# Patient Record
Sex: Male | Born: 1955 | Race: White | Hispanic: No | Smoking: Never smoker
Health system: Southern US, Community
[De-identification: ages and names within clinical notes are randomized; demographics above are authoritative.]

## PROBLEM LIST (undated history)

## (undated) DIAGNOSIS — F1910 Other psychoactive substance abuse, uncomplicated: Secondary | ICD-10-CM

## (undated) DIAGNOSIS — T7840XA Allergy, unspecified, initial encounter: Secondary | ICD-10-CM

## (undated) DIAGNOSIS — C189 Malignant neoplasm of colon, unspecified: Secondary | ICD-10-CM

## (undated) DIAGNOSIS — I714 Abdominal aortic aneurysm, without rupture: Secondary | ICD-10-CM

## (undated) DIAGNOSIS — M1990 Unspecified osteoarthritis, unspecified site: Secondary | ICD-10-CM

## (undated) DIAGNOSIS — Z8489 Family history of other specified conditions: Secondary | ICD-10-CM

## (undated) DIAGNOSIS — Z973 Presence of spectacles and contact lenses: Secondary | ICD-10-CM

## (undated) DIAGNOSIS — J449 Chronic obstructive pulmonary disease, unspecified: Secondary | ICD-10-CM

## (undated) DIAGNOSIS — I10 Essential (primary) hypertension: Secondary | ICD-10-CM

## (undated) DIAGNOSIS — K759 Inflammatory liver disease, unspecified: Secondary | ICD-10-CM

## (undated) DIAGNOSIS — K219 Gastro-esophageal reflux disease without esophagitis: Secondary | ICD-10-CM

## (undated) DIAGNOSIS — K4090 Unilateral inguinal hernia, without obstruction or gangrene, not specified as recurrent: Secondary | ICD-10-CM

## (undated) DIAGNOSIS — I7143 Infrarenal abdominal aortic aneurysm, without rupture: Secondary | ICD-10-CM

## (undated) HISTORY — PX: POLYPECTOMY: SHX149

## (undated) HISTORY — DX: Essential (primary) hypertension: I10

## (undated) HISTORY — PX: OTHER SURGICAL HISTORY: SHX169

## (undated) HISTORY — DX: Other psychoactive substance abuse, uncomplicated: F19.10

## (undated) HISTORY — DX: Chronic obstructive pulmonary disease, unspecified: J44.9

## (undated) HISTORY — DX: Allergy, unspecified, initial encounter: T78.40XA

## (undated) HISTORY — DX: Unspecified osteoarthritis, unspecified site: M19.90

---

## 2001-07-11 ENCOUNTER — Encounter: Payer: Self-pay | Admitting: Internal Medicine

## 2001-07-11 ENCOUNTER — Encounter: Admission: RE | Admit: 2001-07-11 | Discharge: 2001-07-11 | Payer: Self-pay | Admitting: Internal Medicine

## 2009-08-01 ENCOUNTER — Emergency Department (HOSPITAL_COMMUNITY): Admission: EM | Admit: 2009-08-01 | Discharge: 2009-08-01 | Payer: Self-pay | Admitting: Emergency Medicine

## 2009-08-03 ENCOUNTER — Emergency Department (HOSPITAL_COMMUNITY): Admission: EM | Admit: 2009-08-03 | Discharge: 2009-08-03 | Payer: Self-pay | Admitting: Emergency Medicine

## 2011-02-18 LAB — PROTIME-INR
INR: 1 (ref 0.00–1.49)
Prothrombin Time: 12.6 seconds (ref 11.6–15.2)

## 2011-02-18 LAB — CBC
HCT: 43.4 % (ref 39.0–52.0)
Hemoglobin: 14.9 g/dL (ref 13.0–17.0)
MCHC: 34.3 g/dL (ref 30.0–36.0)
MCV: 91.6 fL (ref 78.0–100.0)
Platelets: 184 10*3/uL (ref 150–400)
RBC: 4.74 MIL/uL (ref 4.22–5.81)
RDW: 12.9 % (ref 11.5–15.5)
WBC: 11.1 10*3/uL — ABNORMAL HIGH (ref 4.0–10.5)

## 2011-02-18 LAB — DIFFERENTIAL
Basophils Absolute: 0 10*3/uL (ref 0.0–0.1)
Basophils Relative: 0 % (ref 0–1)
Eosinophils Absolute: 0.1 10*3/uL (ref 0.0–0.7)
Eosinophils Relative: 1 % (ref 0–5)
Lymphocytes Relative: 15 % (ref 12–46)
Lymphs Abs: 1.6 10*3/uL (ref 0.7–4.0)
Monocytes Absolute: 0.8 10*3/uL (ref 0.1–1.0)
Monocytes Relative: 8 % (ref 3–12)
Neutro Abs: 8.5 10*3/uL — ABNORMAL HIGH (ref 1.7–7.7)
Neutrophils Relative %: 77 % (ref 43–77)

## 2011-02-18 LAB — WOUND CULTURE

## 2020-02-29 ENCOUNTER — Ambulatory Visit: Payer: Self-pay | Attending: Internal Medicine

## 2020-02-29 DIAGNOSIS — Z23 Encounter for immunization: Secondary | ICD-10-CM

## 2020-02-29 NOTE — Progress Notes (Signed)
   Covid-19 Vaccination Clinic  Name:  Jordan [name]    MRN: [id number] DOB: 06/10/1956  02/29/2020  [name] was observed post Covid-19 immunization for 15 minutes without incident. He was provided with Vaccine Information Sheet and instruction to access the V-Safe system.   [name] was instructed to call 911 with any severe reactions post vaccine: Marland Kitchen Difficulty breathing  . Swelling of face and throat  . A fast heartbeat  . A bad rash all over body  . Dizziness and weakness   Immunizations Administered    Name Date Dose VIS Date Route   Pfizer COVID-19 Vaccine 02/29/2020 11:42 AM 0.3 mL 10/25/2019 Intramuscular   Manufacturer: [place]   Lot: [id number]   [employer]: [id number]

## 2020-03-23 ENCOUNTER — Ambulatory Visit: Payer: Medicare Other | Attending: Internal Medicine

## 2020-03-25 ENCOUNTER — Ambulatory Visit: Payer: Medicare Other | Attending: Internal Medicine

## 2020-03-25 DIAGNOSIS — Z23 Encounter for immunization: Secondary | ICD-10-CM

## 2020-03-25 NOTE — Progress Notes (Signed)
   Covid-19 Vaccination Clinic  Name:  Jordan [name]    MRN: [id number] DOB: 1955/12/06  03/25/2020  Mr. [name] was observed post Covid-19 immunization for 15 minutes without incident. He was provided with Vaccine Information Sheet and instruction to access the V-Safe system.   Mr. [name] was instructed to call 911 with any severe reactions post vaccine: Marland Kitchen Difficulty breathing  . Swelling of face and throat  . A fast heartbeat  . A bad rash all over body  . Dizziness and weakness   Immunizations Administered    Name Date Dose VIS Date Route   Pfizer COVID-19 Vaccine 03/25/2020 12:50 PM 0.3 mL 01/08/2019 Intramuscular   Manufacturer: [place]   Lot: [id number]   [place]: [id number]

## 2020-04-03 ENCOUNTER — Emergency Department (HOSPITAL_COMMUNITY): Payer: Medicare Other

## 2020-04-03 ENCOUNTER — Inpatient Hospital Stay (HOSPITAL_COMMUNITY)
Admission: EM | Admit: 2020-04-03 | Discharge: 2020-04-06 | DRG: 964 | Disposition: A | Discharge status: Home / Self Care | Payer: Medicare Other | Attending: Surgery | Admitting: Surgery

## 2020-04-03 ENCOUNTER — Encounter (HOSPITAL_COMMUNITY): Payer: Self-pay

## 2020-04-03 DIAGNOSIS — S32019A Unspecified fracture of first lumbar vertebra, initial encounter for closed fracture: Secondary | ICD-10-CM | POA: Diagnosis present

## 2020-04-03 DIAGNOSIS — S24102A Unspecified injury at T2-T6 level of thoracic spinal cord, initial encounter: Secondary | ICD-10-CM | POA: Diagnosis present

## 2020-04-03 DIAGNOSIS — I714 Abdominal aortic aneurysm, without rupture: Secondary | ICD-10-CM | POA: Diagnosis present

## 2020-04-03 DIAGNOSIS — Y990 Civilian activity done for income or pay: Secondary | ICD-10-CM

## 2020-04-03 DIAGNOSIS — W19XXXA Unspecified fall, initial encounter: Secondary | ICD-10-CM | POA: Diagnosis present

## 2020-04-03 DIAGNOSIS — S270XXA Traumatic pneumothorax, initial encounter: Secondary | ICD-10-CM | POA: Diagnosis present

## 2020-04-03 DIAGNOSIS — M4804 Spinal stenosis, thoracic region: Secondary | ICD-10-CM | POA: Diagnosis present

## 2020-04-03 DIAGNOSIS — W14XXXA Fall from tree, initial encounter: Secondary | ICD-10-CM | POA: Diagnosis not present

## 2020-04-03 DIAGNOSIS — S2249XA Multiple fractures of ribs, unspecified side, initial encounter for closed fracture: Secondary | ICD-10-CM

## 2020-04-03 DIAGNOSIS — S42112A Displaced fracture of body of scapula, left shoulder, initial encounter for closed fracture: Secondary | ICD-10-CM | POA: Diagnosis present

## 2020-04-03 DIAGNOSIS — Z20822 Contact with and (suspected) exposure to covid-19: Secondary | ICD-10-CM | POA: Diagnosis present

## 2020-04-03 DIAGNOSIS — S27321A Contusion of lung, unilateral, initial encounter: Secondary | ICD-10-CM | POA: Diagnosis present

## 2020-04-03 DIAGNOSIS — S2243XA Multiple fractures of ribs, bilateral, initial encounter for closed fracture: Secondary | ICD-10-CM | POA: Diagnosis present

## 2020-04-03 DIAGNOSIS — S2241XA Multiple fractures of ribs, right side, initial encounter for closed fracture: Secondary | ICD-10-CM

## 2020-04-03 DIAGNOSIS — S22010A Wedge compression fracture of first thoracic vertebra, initial encounter for closed fracture: Secondary | ICD-10-CM | POA: Diagnosis present

## 2020-04-03 DIAGNOSIS — I2510 Atherosclerotic heart disease of native coronary artery without angina pectoris: Secondary | ICD-10-CM | POA: Diagnosis present

## 2020-04-03 DIAGNOSIS — I700 Atherosclerosis of aorta: Secondary | ICD-10-CM | POA: Diagnosis present

## 2020-04-03 DIAGNOSIS — R52 Pain, unspecified: Secondary | ICD-10-CM

## 2020-04-03 DIAGNOSIS — J9811 Atelectasis: Secondary | ICD-10-CM | POA: Diagnosis present

## 2020-04-03 DIAGNOSIS — S22031A Stable burst fracture of third thoracic vertebra, initial encounter for closed fracture: Principal | ICD-10-CM | POA: Diagnosis present

## 2020-04-03 DIAGNOSIS — S32029A Unspecified fracture of second lumbar vertebra, initial encounter for closed fracture: Secondary | ICD-10-CM | POA: Diagnosis present

## 2020-04-03 DIAGNOSIS — R0602 Shortness of breath: Secondary | ICD-10-CM | POA: Diagnosis not present

## 2020-04-03 DIAGNOSIS — T1490XA Injury, unspecified, initial encounter: Secondary | ICD-10-CM

## 2020-04-03 DIAGNOSIS — J90 Pleural effusion, not elsewhere classified: Secondary | ICD-10-CM | POA: Diagnosis present

## 2020-04-03 DIAGNOSIS — S22009A Unspecified fracture of unspecified thoracic vertebra, initial encounter for closed fracture: Secondary | ICD-10-CM

## 2020-04-03 LAB — I-STAT CHEM 8, ED
BUN: 18 mg/dL (ref 8–23)
Calcium, Ion: 1.03 mmol/L — ABNORMAL LOW (ref 1.15–1.40)
Chloride: 101 mmol/L (ref 98–111)
Creatinine, Ser: 1.1 mg/dL (ref 0.61–1.24)
Glucose, Bld: 134 mg/dL — ABNORMAL HIGH (ref 70–99)
HCT: 48 % (ref 39.0–52.0)
Hemoglobin: 16.3 g/dL (ref 13.0–17.0)
Potassium: 4.1 mmol/L (ref 3.5–5.1)
Sodium: 138 mmol/L (ref 135–145)
TCO2: 28 mmol/L (ref 22–32)

## 2020-04-03 LAB — CBC
HCT: 46.7 % (ref 39.0–52.0)
Hemoglobin: 15.5 g/dL (ref 13.0–17.0)
MCH: 30.2 pg (ref 26.0–34.0)
MCHC: 33.2 g/dL (ref 30.0–36.0)
MCV: 90.9 fL (ref 80.0–100.0)
Platelets: 167 10*3/uL (ref 150–400)
RBC: 5.14 MIL/uL (ref 4.22–5.81)
RDW: 13.3 % (ref 11.5–15.5)
WBC: 7.9 10*3/uL (ref 4.0–10.5)
nRBC: 0 % (ref 0.0–0.2)

## 2020-04-03 LAB — COMPREHENSIVE METABOLIC PANEL
ALT: 71 U/L — ABNORMAL HIGH (ref 0–44)
AST: 91 U/L — ABNORMAL HIGH (ref 15–41)
Albumin: 3.6 g/dL (ref 3.5–5.0)
Alkaline Phosphatase: 64 U/L (ref 38–126)
Anion gap: 12 (ref 5–15)
BUN: 16 mg/dL (ref 8–23)
CO2: 24 mmol/L (ref 22–32)
Calcium: 8.4 mg/dL — ABNORMAL LOW (ref 8.9–10.3)
Chloride: 98 mmol/L (ref 98–111)
Creatinine, Ser: 1.2 mg/dL (ref 0.61–1.24)
GFR calc Af Amer: >60 mL/min (ref >60)
GFR calc non Af Amer: >60 mL/min (ref >60)
Glucose, Bld: 149 mg/dL — ABNORMAL HIGH (ref 70–99)
Potassium: 4.2 mmol/L (ref 3.5–5.1)
Sodium: 134 mmol/L — ABNORMAL LOW (ref 135–145)
Total Bilirubin: 1 mg/dL (ref 0.3–1.2)
Total Protein: 6.6 g/dL (ref 6.5–8.1)

## 2020-04-03 LAB — PROTIME-INR
INR: 1 (ref 0.8–1.2)
Prothrombin Time: 12.9 seconds (ref 11.4–15.2)

## 2020-04-03 LAB — ETHANOL: Alcohol, Ethyl (B): <10 mg/dL (ref <10)

## 2020-04-03 LAB — SAMPLE TO BLOOD BANK

## 2020-04-03 LAB — LACTIC ACID, PLASMA: Lactic Acid, Venous: 2.2 mmol/L (ref 0.5–1.9)

## 2020-04-03 LAB — SARS CORONAVIRUS 2 BY RT PCR (HOSPITAL ORDER, PERFORMED IN ~~LOC~~ HOSPITAL LAB): SARS Coronavirus 2: NEGATIVE

## 2020-04-03 MED ORDER — FENTANYL CITRATE (PF) 100 MCG/2ML IJ SOLN
50.0000 ug | Freq: Once | INTRAMUSCULAR | Status: AC
  Administered 2020-04-03: 50 ug via INTRAVENOUS

## 2020-04-03 MED ORDER — PANTOPRAZOLE SODIUM 40 MG IV SOLR: 40.0000 mg | Freq: Every day | INTRAVENOUS | Status: DC

## 2020-04-03 MED ORDER — OXYCODONE HCL 5 MG PO TABS
5.0000 mg | ORAL_TABLET | ORAL | Status: DC | PRN
  Administered 2020-04-03 – 2020-04-06 (×10): 5 mg via ORAL
  Filled 2020-04-03 (×10): qty 1

## 2020-04-03 MED ORDER — SODIUM CHLORIDE 0.9 % IV SOLN: INTRAVENOUS | Status: DC

## 2020-04-03 MED ORDER — IBUPROFEN 800 MG PO TABS
800.0000 mg | ORAL_TABLET | Freq: Three times a day (TID) | ORAL | Status: DC
  Administered 2020-04-03 – 2020-04-06 (×9): 800 mg via ORAL
  Filled 2020-04-03 (×2): qty 1
  Filled 2020-04-03: qty 4
  Filled 2020-04-03: qty 1
  Filled 2020-04-03 (×4): qty 4
  Filled 2020-04-03 (×2): qty 1
  Filled 2020-04-03 (×2): qty 4
  Filled 2020-04-03 (×3): qty 1

## 2020-04-03 MED ORDER — ONDANSETRON HCL 4 MG/2ML IJ SOLN: 4.0000 mg | Freq: Four times a day (QID) | INTRAMUSCULAR | Status: DC | PRN

## 2020-04-03 MED ORDER — CHLORHEXIDINE GLUCONATE 0.12 % MT SOLN
15.0000 mL | Freq: Two times a day (BID) | OROMUCOSAL | Status: DC
  Administered 2020-04-04 – 2020-04-06 (×5): 15 mL via OROMUCOSAL
  Filled 2020-04-03 (×2): qty 15

## 2020-04-03 MED ORDER — IOHEXOL 300 MG/ML  SOLN
100.0000 mL | Freq: Once | INTRAMUSCULAR | Status: AC | PRN
  Administered 2020-04-03: 100 mL via INTRAVENOUS

## 2020-04-03 MED ORDER — HYDROMORPHONE HCL 1 MG/ML IJ SOLN
0.5000 mg | INTRAMUSCULAR | Status: DC | PRN
  Administered 2020-04-04 – 2020-04-06 (×8): 0.5 mg via INTRAVENOUS
  Filled 2020-04-03 (×8): qty 1

## 2020-04-03 MED ORDER — METOPROLOL TARTRATE 5 MG/5ML IV SOLN: 5.0000 mg | Freq: Four times a day (QID) | INTRAVENOUS | Status: DC | PRN

## 2020-04-03 MED ORDER — HYDRALAZINE HCL 20 MG/ML IJ SOLN
10.0000 mg | INTRAMUSCULAR | Status: DC | PRN
  Administered 2020-04-03: 10 mg via INTRAVENOUS
  Filled 2020-04-03: qty 1

## 2020-04-03 MED ORDER — FENTANYL CITRATE (PF) 100 MCG/2ML IJ SOLN
INTRAMUSCULAR | Status: AC
  Filled 2020-04-03: qty 2

## 2020-04-03 MED ORDER — METHOCARBAMOL 500 MG PO TABS
500.0000 mg | ORAL_TABLET | Freq: Four times a day (QID) | ORAL | Status: DC | PRN
  Administered 2020-04-03 – 2020-04-06 (×8): 500 mg via ORAL
  Filled 2020-04-03 (×8): qty 1

## 2020-04-03 MED ORDER — PANTOPRAZOLE SODIUM 40 MG PO TBEC
40.0000 mg | DELAYED_RELEASE_TABLET | Freq: Every day | ORAL | Status: DC
  Administered 2020-04-04 – 2020-04-06 (×3): 40 mg via ORAL
  Filled 2020-04-03 (×3): qty 1

## 2020-04-03 MED ORDER — ACETAMINOPHEN 325 MG PO TABS
650.0000 mg | ORAL_TABLET | Freq: Four times a day (QID) | ORAL | Status: DC
  Administered 2020-04-03 – 2020-04-06 (×12): 650 mg via ORAL
  Filled 2020-04-03 (×10): qty 2

## 2020-04-03 MED ORDER — GABAPENTIN 300 MG PO CAPS
300.0000 mg | ORAL_CAPSULE | Freq: Three times a day (TID) | ORAL | Status: DC
  Administered 2020-04-03 – 2020-04-06 (×9): 300 mg via ORAL
  Filled 2020-04-03: qty 3
  Filled 2020-04-03 (×8): qty 1

## 2020-04-03 MED ORDER — DOCUSATE SODIUM 100 MG PO CAPS
100.0000 mg | ORAL_CAPSULE | Freq: Two times a day (BID) | ORAL | Status: DC
  Administered 2020-04-03 – 2020-04-06 (×5): 100 mg via ORAL
  Filled 2020-04-03 (×5): qty 1

## 2020-04-03 MED ORDER — ONDANSETRON 4 MG PO TBDP: 4.0000 mg | ORAL_TABLET | Freq: Four times a day (QID) | ORAL | Status: DC | PRN

## 2020-04-03 MED ORDER — CHLORHEXIDINE GLUCONATE CLOTH 2 % EX PADS
6.0000 | MEDICATED_PAD | Freq: Every day | CUTANEOUS | Status: DC
  Administered 2020-04-06: 6 via TOPICAL

## 2020-04-03 NOTE — Progress Notes (Signed)
The chaplain responded to the trauma page. The chaplain briefly met with the family and let the nursing staff know that family had arrived. The chaplain is available if support is needed.  Brion Aliment Chaplain Resident For questions concerning this note please contact me by pager [phone]

## 2020-04-03 NOTE — Progress Notes (Signed)
Orthopedic Tech Progress Note Patient Details:  Jordan [name] 25-Aug-1956 [id number]  Ortho Devices Type of Ortho Device: Arm sling Ortho Device/Splint Location: LUE Ortho Device/Splint Interventions: Ordered, Application   Post Interventions Patient Tolerated: Well Instructions Provided: Adjustment of device, Care of device   Jordan [name] 04/03/2020, 11:58 PM

## 2020-04-03 NOTE — ED Provider Notes (Signed)
[facility]GICAL ICU Provider Note   CSN: [id number] Arrival date & time: 04/03/20  2129     History Chief Complaint  Patient presents with  . Fall    Jordan [name] is a 64 y.o. male.  Patient arrives as a level 1 trauma with right-sided chest pain, rib wall pain progressive shortness of breath throughout the day.  Patient fell 20 foot from a tree about 8 hours ago.  Was ambulatory at home.  Admits to cocaine use/crack use.  No loss consciousness.  Not on blood thinners.  The history is provided by the patient.  Fall This is a new problem. The current episode started 6 to 12 hours ago. The problem occurs constantly. The problem has not changed since onset.Associated symptoms include chest pain. Pertinent negatives include no abdominal pain, no headaches and no shortness of breath. Nothing aggravates the symptoms. Nothing relieves the symptoms. He has tried nothing for the symptoms. The treatment provided no relief.       History reviewed. No pertinent past medical history.  Patient Active Problem List   Diagnosis Date Noted  . Fall 04/03/2020    History reviewed. No pertinent surgical history.     No family history on file.  Social History   Tobacco Use  . Smoking status: Never Smoker  . Smokeless tobacco: Never Used  Substance Use Topics  . Alcohol use: Never  . Drug use: Yes    Types: Cocaine    Home Medications Prior to Admission medications   Not on File    Allergies    Patient has no known allergies.  Review of Systems   Review of Systems  Constitutional: Negative for chills and fever.  HENT: Negative for ear pain and sore throat.   Eyes: Negative for pain and visual disturbance.  Respiratory: Negative for cough and shortness of breath.   Cardiovascular: Positive for chest pain. Negative for palpitations.  Gastrointestinal: Negative for abdominal pain and vomiting.  Genitourinary: Negative for dysuria and hematuria.    Musculoskeletal: Negative for arthralgias and back pain.  Skin: Negative for color change and rash.  Neurological: Negative for seizures, syncope and headaches.  All other systems reviewed and are negative.   Physical Exam Updated Vital Signs  ED Triage Vitals  Enc Vitals Group     BP 04/03/20 2134 (!) 152/110     Pulse Rate 04/03/20 2135 74     Resp 04/03/20 2135 18     Temp 04/03/20 2139 97.6 F (36.4 C)     Temp Source 04/03/20 2332 Oral     SpO2 04/03/20 2135 100 %     Weight 04/03/20 2139 180 lb (81.6 kg)     Height 04/03/20 2139 6' (1.829 m)     Head Circumference --      Peak Flow --      Pain Score 04/03/20 2139 10     Pain Loc --      Pain Edu? --      Excl. in [place]? --     Physical Exam Vitals and nursing note reviewed.  Constitutional:      General: He is in acute distress.     Appearance: He is well-developed. He is ill-appearing.  HENT:     Head: Normocephalic and atraumatic.     Nose: Nose normal.     Mouth/Throat:     Mouth: Mucous membranes are moist.  Eyes:     Extraocular Movements: Extraocular movements intact.  Conjunctiva/sclera: Conjunctivae normal.     Pupils: Pupils are equal, round, and reactive to light.  Neck:     Comments: In cervical collar Cardiovascular:     Rate and Rhythm: Normal rate and regular rhythm.     Pulses: Normal pulses.     Heart sounds: Normal heart sounds. No murmur.  Pulmonary:     Effort: Pulmonary effort is normal. No respiratory distress.     Breath sounds: Normal breath sounds.  Abdominal:     General: There is no distension.     Palpations: Abdomen is soft.     Tenderness: There is no abdominal tenderness.  Musculoskeletal:        General: Tenderness present.     Cervical back: Neck supple. No tenderness.     Comments: Patient with severe tenderness and crepitus to the right posterior spine, no midline spinal tenderness  Skin:    General: Skin is warm and dry.     Capillary Refill: Capillary refill  takes less than 2 seconds.  Neurological:     General: No focal deficit present.     Mental Status: He is alert and oriented to person, place, and time.     Cranial Nerves: No cranial nerve deficit.     Sensory: No sensory deficit.     ED Results / Procedures / Treatments   Labs (all labs ordered are listed, but only abnormal results are displayed) Labs Reviewed  COMPREHENSIVE METABOLIC PANEL - Abnormal; Notable for the following components:      Result Value   Sodium 134 (*)    Glucose, Bld 149 (*)    Calcium 8.4 (*)    AST 91 (*)    ALT 71 (*)    All other components within normal limits  LACTIC ACID, PLASMA - Abnormal; Notable for the following components:   Lactic Acid, Venous 2.2 (*)    All other components within normal limits  I-STAT CHEM 8, ED - Abnormal; Notable for the following components:   Glucose, Bld 134 (*)    Calcium, Ion 1.03 (*)    All other components within normal limits  SARS CORONAVIRUS 2 BY RT PCR (HOSPITAL ORDER, [facility])  MRSA PCR SCREENING  CBC  ETHANOL  PROTIME-INR  URINALYSIS, ROUTINE W REFLEX MICROSCOPIC  CBC  BASIC METABOLIC PANEL  HIV ANTIBODY (ROUTINE TESTING W REFLEX)  SAMPLE TO BLOOD BANK    EKG EKG Interpretation  Date/Time:  Friday Apr 03 2020 21:38:49 EDT Ventricular Rate:  79 PR Interval:    QRS Duration: 96 QT Interval:  386 QTC Calculation: 443 R Axis:   19 Text Interpretation: Atrial flutter Anteroseptal infarct, old Nonspecific T abnormalities, lateral leads Confirmed by Lennice Sites [phone]) on 04/03/2020 10:26:22 PM   Radiology CT HEAD WO CONTRAST  Addendum Date: 04/03/2020   ADDENDUM REPORT: 04/03/2020 23:05 ADDENDUM: Error identified in the physician notification attestation below -- should read: These results were called by telephone at the time of interpretation on 04/03/2020 at 10:27 pm to provider Dr. Kae Heller of surgery, who verbally acknowledged these results. Electronically Signed    By: Lovena Le M.D.   On: 04/03/2020 23:05   Result Date: 04/03/2020 CLINICAL DATA:  Level 1 trauma: Fell 20 feet from tree. EXAM: CT HEAD WITHOUT CONTRAST CT CERVICAL SPINE WITHOUT CONTRAST CT CHEST, ABDOMEN AND PELVIS WITH CONTRAST TECHNIQUE: Contiguous axial images were obtained from the base of the skull through the vertex without intravenous contrast. Multidetector CT imaging of  the cervical spine was performed without intravenous contrast. Multiplanar CT image reconstructions were also generated. Multidetector CT imaging of the chest, abdomen and pelvis was performed following the standard protocol during bolus administration of intravenous contrast. CONTRAST:  100 cc Omnipaque 350 IV COMPARISON:  Portable chest and pelvic radiograph same day FINDINGS: CT HEAD FINDINGS Brain: Likely remote lacune in the left external capsule/insula. No evidence of acute infarction, hemorrhage, hydrocephalus, extra-axial collection or mass lesion/mass effect. Symmetric prominence of the ventricles, cisterns and sulci compatible with parenchymal volume loss. Patchy areas of white matter hypoattenuation are most compatible with chronic microvascular angiopathy. Vascular: Atherosclerotic calcification of the carotid siphons. No hyperdense vessel. Skull: No focal scalp swelling or hematoma. No calvarial fracture or suspicious osseous lesion. Punctate dermal hyperdensities in the right frontal and left frontal scalp may reflect debris versus benign dermal calcification. Sinuses/Orbits: Minimal pneumatized secretions in the right maxillary sinus. Remaining paranasal sinuses mastoid air cells are predominantly clear. Middle ear cavities are clear. Ossicular chains appear normally configured. Included orbital structures are unremarkable. Other: None CT CERVICAL SPINE FINDINGS Alignment: Preservation of the normal cervical lordosis. Minimal retrolisthesis C3 on C4 and C5 on C6 are likely on a degenerative basis given spondylitic  and facet degenerative changes at these levels. No evidence of traumatic listhesis. No abnormally widened, perched or jumped facets. Asymmetric left facet arthropathy is noted C4-C6. Normal alignment of the craniocervical and atlantoaxial articulations. Skull base and vertebrae: No acute fracture of the skull base or cervical vertebral bodies. Anterior wedging compression deformity of the T1 vertebral level (AO spine A1). Compression deformity of T2 is better detailed on chest CT and dedicated thoracic recon Speer posterior elements are intact. No worrisome osseous lesions. Soft tissues and spinal canal: No pre or paravertebral fluid or swelling. No visible canal hematoma. Disc levels: Multilevel intervertebral disc height loss with spondylitic endplate changes. Disc osteophyte complexes are present throughout the cervical spine with at most mild canal narrowing at the C5-6 and C6-7 levels secondary to posterior disc osteophyte complexes. CT CHEST FINDINGS Cardiovascular: The aortic root is suboptimally assessed given cardiac pulsation artifact. Calcifications upon the aortic leaflets. Atherosclerotic plaque within the normal caliber aorta. No intramural hematoma, dissection flap or other acute luminal abnormality of the aorta is seen. No periaortic stranding or hemorrhage. Shared origin of the brachiocephalic and left common carotid artery. Tortuosity of the brachiocephalic vasculature with minimal plaque in the proximal great vessels which are otherwise normally opacified. Central pulmonary arteries are normal caliber no large central filling defects though poorly assessed given suboptimal contrast timing for evaluation of the pulmonary arteries. Normal heart size. No pericardial effusion. Coronary artery atherosclerosis is present. Mediastinum/Nodes: No mediastinal fluid or gas. Normal thyroid gland and thoracic inlet. No acute abnormality of the trachea or esophagus. No worrisome mediastinal, hilar or axillary  adenopathy. Lungs/Pleura: There is a trace anterior apical right pneumothorax (3/10). Small right pleural effusion posteriorly. No left pneumothorax or effusion. Some ground-glass in the adjacent posterior bilateral lower lobes, right greater than left, may reflect combination of atelectasis and contusive changes with multiple rib fractures detailed below. No other acute traumatic abnormality of the lung parenchyma. Musculoskeletal: Anterior wedging compression deformity at T1 (AO spine A1) with 40% height loss anteriorly. Pincer type deformity of T2 with 60% height loss (AO spine A2). Incomplete burst fracture T3 superior endplate with up to D34-534 height loss (AO spine A3). 2 mm of retropulsion of the superior endplate fracture fragments. Atypical widening of the spinous processes of  T2-3 and T3-4 some adjacent stranding and thickening, could suggest some underlying disruption of the posterior tension band. Fracture of the right transverse process T3 (AO spine A0). Multiple contiguous posterior 4th through 11th right rib fractures including additional lateral segmental fractures of the fourth through eighth ribs. Contiguous posterior fractures of the left 3rd to 12th ribs with more lateral segmental components of the ninth through eleventh ribs. There is a highly comminuted and displaced fracture of the left scapula extending predominantly through the inferior scapular body extending to the base of the scapular spine and glenoid. There is associated intramuscular contusion in likely hemorrhage within the infraspinatus and subscapularis muscle bellies. CT ABDOMEN PELVIS FINDINGS Hepatobiliary: No direct hepatic injury or perihepatic hematoma. No focal liver abnormality is seen. No gallstones, gallbladder wall thickening, or biliary dilatation. Pancreas: No direct pancreatic contusive change. Unremarkable. No pancreatic ductal dilatation or surrounding inflammatory changes. Spleen: No direct splenic injury or  perisplenic hematoma. Normal splenic size. No concerning splenic lesion. Adrenals/Urinary Tract: Normal adrenal glands, no evidence of adrenal hematoma or hemorrhage. No direct renal lesion or perinephric hemorrhage. Areas of cortical scarring throughout the right kidney are present. No suspicious renal lesion, urolithiasis or hydronephrosis. No Urinary bladder is largely decompressed at the time of exam and therefore poorly evaluated by CT imaging. No evidence of direct bladder injury. Stomach/Bowel: Motion artifact may limit detection of subtle bowel and mesenteric abnormalities. Distal esophagus, stomach and duodenal sweep are unremarkable. No small bowel wall thickening or dilatation. No evidence of obstruction. Fecalized contents of the distal small bowel may reflect some slowed intestinal transit. The appendix is not well visualized. No colonic dilatation or wall thickening. No direct mesenteric contusion or hemorrhage is evident. Vascular/Lymphatic: No acute aortic injury or dissection is seen. There is a 3.4 cm infrarenal abdominal aortic aneurysm without periaortic stranding or hemorrhage. Atherosclerotic calcifications noted throughout the abdominal aorta and branch vessels. No suspicious or enlarged lymph nodes in the included lymphatic chains. Reproductive: The prostate and seminal vesicles are unremarkable. Other: There is a small volume of intermediate attenuation (27 HU) fluid in the deep pelvis without a clear source of bleeding or hemorrhage. Musculoskeletal: Dedicated lumbar reconstructions were generated and dictated separately. In brief there is a likely fracture of the left L1 and L2 transverse processes without vertebral body fracture or height loss. The bones of pelvis appear intact and congruent without acute osseous injury. IMPRESSION: Traumatic Findings: 1. No acute intracranial abnormality. 2. No acute cervical fracture or traumatic listhesis. 3. Anterior wedging compression deformity of  the T1 vertebral level (AO spine A1) with up to 45% height loss anteriorly. 4. Pincer type deformity of T2 with up to 60% height loss (AO spine A2). 5. Incomplete burst fracture superior endplate T3 (AO spine A3). Up to 2 mm of retropulsion and at most mild canal stenosis. 6. Fracture of the right transverse process T3 (AO spine A0). 7. Widening of the spinous processes of T2-3 and T3-4. Cannot exclude disruption of the posterior tension band. Consider MRI for further assessment. 8. Comminuted and displaced fracture of the left scapula involving the inferior scapular body extending to base of the scapular spine and glenoid. Associated intramuscular contusion/hemorrhage within the infraspinatus and subscapularis. 9. Contiguous fourth through eleventh right rib fractures including segmental fractures the fourth through eighth ribs. Contiguous third through eleventh left rib fractures including segmental fractures of the left ninth through eleventh ribs. Given the extent of the segmental rib fractures assessment for flail chest morphology is recommended.  10. Trace anterior apical right pneumothorax.  Small right effusion. 11. Dependent ground-glass in both lungs may reflect a combination of atelectasis and or minimal pulmonary contusive change. 12. Fractures of the L1 and L2 left transverse processes. 13. Small volume of intermediate attenuation fluid in the abdomen and pelvis without clear source such as direct bowel injury or mesenteric hematoma or contusion though motion artifact may limit detection of subtle bowel and mesenteric anomaly. Recommend close clinical monitoring and serial abdominal examinations. Nontraumatic Findings: 1. 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 2. Multilevel cervical spondylitic changes as well as additional multilevel degenerative changes in the thoracic and lumbar spine. 3. Aortic Atherosclerosis (ICD10-I70.0). 4. 3.4 cm infrarenal abdominal  aortic aneurysm. Recommend followup by abdomen and pelvis CTA in 6 months, and vascular surgery referral/consultation if not already obtained. This recommendation follows ACR consensus guidelines: White Paper of the ACR Incidental Findings Committee II on Vascular Findings. J Am Coll Radiol 2013; 10:789-794. Aortic aneurysm NOS (ICD10-I71.9) 5. Coronary artery calcifications are present. Please note that the presence of coronary artery calcium documents the presence of coronary artery disease, the severity of this disease and any potential stenosis cannot be assessed on this non-gated CT examination. 6. Cortical scarring seen in the right kidney. These results were called by telephone at the time of interpretation on 04/03/2020 at 10:27 pm to provider Tevion Laforge , who verbally acknowledged these results. Electronically Signed: By: Lovena Le M.D. On: 04/03/2020 22:48   CT CHEST W CONTRAST  Addendum Date: 04/03/2020   ADDENDUM REPORT: 04/03/2020 23:05 ADDENDUM: Error identified in the physician notification attestation below -- should read: These results were called by telephone at the time of interpretation on 04/03/2020 at 10:27 pm to provider Dr. Kae Heller of surgery, who verbally acknowledged these results. Electronically Signed   By: Lovena Le M.D.   On: 04/03/2020 23:05   Result Date: 04/03/2020 CLINICAL DATA:  Level 1 trauma: Fell 20 feet from tree. EXAM: CT HEAD WITHOUT CONTRAST CT CERVICAL SPINE WITHOUT CONTRAST CT CHEST, ABDOMEN AND PELVIS WITH CONTRAST TECHNIQUE: Contiguous axial images were obtained from the base of the skull through the vertex without intravenous contrast. Multidetector CT imaging of the cervical spine was performed without intravenous contrast. Multiplanar CT image reconstructions were also generated. Multidetector CT imaging of the chest, abdomen and pelvis was performed following the standard protocol during bolus administration of intravenous contrast. CONTRAST:  100 cc  Omnipaque 350 IV COMPARISON:  Portable chest and pelvic radiograph same day FINDINGS: CT HEAD FINDINGS Brain: Likely remote lacune in the left external capsule/insula. No evidence of acute infarction, hemorrhage, hydrocephalus, extra-axial collection or mass lesion/mass effect. Symmetric prominence of the ventricles, cisterns and sulci compatible with parenchymal volume loss. Patchy areas of white matter hypoattenuation are most compatible with chronic microvascular angiopathy. Vascular: Atherosclerotic calcification of the carotid siphons. No hyperdense vessel. Skull: No focal scalp swelling or hematoma. No calvarial fracture or suspicious osseous lesion. Punctate dermal hyperdensities in the right frontal and left frontal scalp may reflect debris versus benign dermal calcification. Sinuses/Orbits: Minimal pneumatized secretions in the right maxillary sinus. Remaining paranasal sinuses mastoid air cells are predominantly clear. Middle ear cavities are clear. Ossicular chains appear normally configured. Included orbital structures are unremarkable. Other: None CT CERVICAL SPINE FINDINGS Alignment: Preservation of the normal cervical lordosis. Minimal retrolisthesis C3 on C4 and C5 on C6 are likely on a degenerative basis given spondylitic and facet degenerative changes at these levels. No evidence of  traumatic listhesis. No abnormally widened, perched or jumped facets. Asymmetric left facet arthropathy is noted C4-C6. Normal alignment of the craniocervical and atlantoaxial articulations. Skull base and vertebrae: No acute fracture of the skull base or cervical vertebral bodies. Anterior wedging compression deformity of the T1 vertebral level (AO spine A1). Compression deformity of T2 is better detailed on chest CT and dedicated thoracic recon Speer posterior elements are intact. No worrisome osseous lesions. Soft tissues and spinal canal: No pre or paravertebral fluid or swelling. No visible canal hematoma. Disc  levels: Multilevel intervertebral disc height loss with spondylitic endplate changes. Disc osteophyte complexes are present throughout the cervical spine with at most mild canal narrowing at the C5-6 and C6-7 levels secondary to posterior disc osteophyte complexes. CT CHEST FINDINGS Cardiovascular: The aortic root is suboptimally assessed given cardiac pulsation artifact. Calcifications upon the aortic leaflets. Atherosclerotic plaque within the normal caliber aorta. No intramural hematoma, dissection flap or other acute luminal abnormality of the aorta is seen. No periaortic stranding or hemorrhage. Shared origin of the brachiocephalic and left common carotid artery. Tortuosity of the brachiocephalic vasculature with minimal plaque in the proximal great vessels which are otherwise normally opacified. Central pulmonary arteries are normal caliber no large central filling defects though poorly assessed given suboptimal contrast timing for evaluation of the pulmonary arteries. Normal heart size. No pericardial effusion. Coronary artery atherosclerosis is present. Mediastinum/Nodes: No mediastinal fluid or gas. Normal thyroid gland and thoracic inlet. No acute abnormality of the trachea or esophagus. No worrisome mediastinal, hilar or axillary adenopathy. Lungs/Pleura: There is a trace anterior apical right pneumothorax (3/10). Small right pleural effusion posteriorly. No left pneumothorax or effusion. Some ground-glass in the adjacent posterior bilateral lower lobes, right greater than left, may reflect combination of atelectasis and contusive changes with multiple rib fractures detailed below. No other acute traumatic abnormality of the lung parenchyma. Musculoskeletal: Anterior wedging compression deformity at T1 (AO spine A1) with 40% height loss anteriorly. Pincer type deformity of T2 with 60% height loss (AO spine A2). Incomplete burst fracture T3 superior endplate with up to D34-534 height loss (AO spine A3). 2 mm  of retropulsion of the superior endplate fracture fragments. Atypical widening of the spinous processes of T2-3 and T3-4 some adjacent stranding and thickening, could suggest some underlying disruption of the posterior tension band. Fracture of the right transverse process T3 (AO spine A0). Multiple contiguous posterior 4th through 11th right rib fractures including additional lateral segmental fractures of the fourth through eighth ribs. Contiguous posterior fractures of the left 3rd to 12th ribs with more lateral segmental components of the ninth through eleventh ribs. There is a highly comminuted and displaced fracture of the left scapula extending predominantly through the inferior scapular body extending to the base of the scapular spine and glenoid. There is associated intramuscular contusion in likely hemorrhage within the infraspinatus and subscapularis muscle bellies. CT ABDOMEN PELVIS FINDINGS Hepatobiliary: No direct hepatic injury or perihepatic hematoma. No focal liver abnormality is seen. No gallstones, gallbladder wall thickening, or biliary dilatation. Pancreas: No direct pancreatic contusive change. Unremarkable. No pancreatic ductal dilatation or surrounding inflammatory changes. Spleen: No direct splenic injury or perisplenic hematoma. Normal splenic size. No concerning splenic lesion. Adrenals/Urinary Tract: Normal adrenal glands, no evidence of adrenal hematoma or hemorrhage. No direct renal lesion or perinephric hemorrhage. Areas of cortical scarring throughout the right kidney are present. No suspicious renal lesion, urolithiasis or hydronephrosis. No Urinary bladder is largely decompressed at the time of exam and therefore poorly  evaluated by CT imaging. No evidence of direct bladder injury. Stomach/Bowel: Motion artifact may limit detection of subtle bowel and mesenteric abnormalities. Distal esophagus, stomach and duodenal sweep are unremarkable. No small bowel wall thickening or  dilatation. No evidence of obstruction. Fecalized contents of the distal small bowel may reflect some slowed intestinal transit. The appendix is not well visualized. No colonic dilatation or wall thickening. No direct mesenteric contusion or hemorrhage is evident. Vascular/Lymphatic: No acute aortic injury or dissection is seen. There is a 3.4 cm infrarenal abdominal aortic aneurysm without periaortic stranding or hemorrhage. Atherosclerotic calcifications noted throughout the abdominal aorta and branch vessels. No suspicious or enlarged lymph nodes in the included lymphatic chains. Reproductive: The prostate and seminal vesicles are unremarkable. Other: There is a small volume of intermediate attenuation (27 HU) fluid in the deep pelvis without a clear source of bleeding or hemorrhage. Musculoskeletal: Dedicated lumbar reconstructions were generated and dictated separately. In brief there is a likely fracture of the left L1 and L2 transverse processes without vertebral body fracture or height loss. The bones of pelvis appear intact and congruent without acute osseous injury. IMPRESSION: Traumatic Findings: 1. No acute intracranial abnormality. 2. No acute cervical fracture or traumatic listhesis. 3. Anterior wedging compression deformity of the T1 vertebral level (AO spine A1) with up to 45% height loss anteriorly. 4. Pincer type deformity of T2 with up to 60% height loss (AO spine A2). 5. Incomplete burst fracture superior endplate T3 (AO spine A3). Up to 2 mm of retropulsion and at most mild canal stenosis. 6. Fracture of the right transverse process T3 (AO spine A0). 7. Widening of the spinous processes of T2-3 and T3-4. Cannot exclude disruption of the posterior tension band. Consider MRI for further assessment. 8. Comminuted and displaced fracture of the left scapula involving the inferior scapular body extending to base of the scapular spine and glenoid. Associated intramuscular contusion/hemorrhage within  the infraspinatus and subscapularis. 9. Contiguous fourth through eleventh right rib fractures including segmental fractures the fourth through eighth ribs. Contiguous third through eleventh left rib fractures including segmental fractures of the left ninth through eleventh ribs. Given the extent of the segmental rib fractures assessment for flail chest morphology is recommended. 10. Trace anterior apical right pneumothorax.  Small right effusion. 11. Dependent ground-glass in both lungs may reflect a combination of atelectasis and or minimal pulmonary contusive change. 12. Fractures of the L1 and L2 left transverse processes. 13. Small volume of intermediate attenuation fluid in the abdomen and pelvis without clear source such as direct bowel injury or mesenteric hematoma or contusion though motion artifact may limit detection of subtle bowel and mesenteric anomaly. Recommend close clinical monitoring and serial abdominal examinations. Nontraumatic Findings: 1. 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 2. Multilevel cervical spondylitic changes as well as additional multilevel degenerative changes in the thoracic and lumbar spine. 3. Aortic Atherosclerosis (ICD10-I70.0). 4. 3.4 cm infrarenal abdominal aortic aneurysm. Recommend followup by abdomen and pelvis CTA in 6 months, and vascular surgery referral/consultation if not already obtained. This recommendation follows ACR consensus guidelines: White Paper of the ACR Incidental Findings Committee II on Vascular Findings. J Am Coll Radiol 2013; 10:789-794. Aortic aneurysm NOS (ICD10-I71.9) 5. Coronary artery calcifications are present. Please note that the presence of coronary artery calcium documents the presence of coronary artery disease, the severity of this disease and any potential stenosis cannot be assessed on this non-gated CT examination. 6. Cortical scarring seen in the right kidney.  These results were called by  telephone at the time of interpretation on 04/03/2020 at 10:27 pm to provider Ailin Rochford , who verbally acknowledged these results. Electronically Signed: By: Lovena Le M.D. On: 04/03/2020 22:48   CT Cervical Spine Wo Contrast  Addendum Date: 04/03/2020   ADDENDUM REPORT: 04/03/2020 23:05 ADDENDUM: Error identified in the physician notification attestation below -- should read: These results were called by telephone at the time of interpretation on 04/03/2020 at 10:27 pm to provider Dr. Kae Heller of surgery, who verbally acknowledged these results. Electronically Signed   By: Lovena Le M.D.   On: 04/03/2020 23:05   Result Date: 04/03/2020 CLINICAL DATA:  Level 1 trauma: Fell 20 feet from tree. EXAM: CT HEAD WITHOUT CONTRAST CT CERVICAL SPINE WITHOUT CONTRAST CT CHEST, ABDOMEN AND PELVIS WITH CONTRAST TECHNIQUE: Contiguous axial images were obtained from the base of the skull through the vertex without intravenous contrast. Multidetector CT imaging of the cervical spine was performed without intravenous contrast. Multiplanar CT image reconstructions were also generated. Multidetector CT imaging of the chest, abdomen and pelvis was performed following the standard protocol during bolus administration of intravenous contrast. CONTRAST:  100 cc Omnipaque 350 IV COMPARISON:  Portable chest and pelvic radiograph same day FINDINGS: CT HEAD FINDINGS Brain: Likely remote lacune in the left external capsule/insula. No evidence of acute infarction, hemorrhage, hydrocephalus, extra-axial collection or mass lesion/mass effect. Symmetric prominence of the ventricles, cisterns and sulci compatible with parenchymal volume loss. Patchy areas of white matter hypoattenuation are most compatible with chronic microvascular angiopathy. Vascular: Atherosclerotic calcification of the carotid siphons. No hyperdense vessel. Skull: No focal scalp swelling or hematoma. No calvarial fracture or suspicious osseous lesion. Punctate  dermal hyperdensities in the right frontal and left frontal scalp may reflect debris versus benign dermal calcification. Sinuses/Orbits: Minimal pneumatized secretions in the right maxillary sinus. Remaining paranasal sinuses mastoid air cells are predominantly clear. Middle ear cavities are clear. Ossicular chains appear normally configured. Included orbital structures are unremarkable. Other: None CT CERVICAL SPINE FINDINGS Alignment: Preservation of the normal cervical lordosis. Minimal retrolisthesis C3 on C4 and C5 on C6 are likely on a degenerative basis given spondylitic and facet degenerative changes at these levels. No evidence of traumatic listhesis. No abnormally widened, perched or jumped facets. Asymmetric left facet arthropathy is noted C4-C6. Normal alignment of the craniocervical and atlantoaxial articulations. Skull base and vertebrae: No acute fracture of the skull base or cervical vertebral bodies. Anterior wedging compression deformity of the T1 vertebral level (AO spine A1). Compression deformity of T2 is better detailed on chest CT and dedicated thoracic recon Speer posterior elements are intact. No worrisome osseous lesions. Soft tissues and spinal canal: No pre or paravertebral fluid or swelling. No visible canal hematoma. Disc levels: Multilevel intervertebral disc height loss with spondylitic endplate changes. Disc osteophyte complexes are present throughout the cervical spine with at most mild canal narrowing at the C5-6 and C6-7 levels secondary to posterior disc osteophyte complexes. CT CHEST FINDINGS Cardiovascular: The aortic root is suboptimally assessed given cardiac pulsation artifact. Calcifications upon the aortic leaflets. Atherosclerotic plaque within the normal caliber aorta. No intramural hematoma, dissection flap or other acute luminal abnormality of the aorta is seen. No periaortic stranding or hemorrhage. Shared origin of the brachiocephalic and left common carotid artery.  Tortuosity of the brachiocephalic vasculature with minimal plaque in the proximal great vessels which are otherwise normally opacified. Central pulmonary arteries are normal caliber no large central filling defects though poorly assessed given  suboptimal contrast timing for evaluation of the pulmonary arteries. Normal heart size. No pericardial effusion. Coronary artery atherosclerosis is present. Mediastinum/Nodes: No mediastinal fluid or gas. Normal thyroid gland and thoracic inlet. No acute abnormality of the trachea or esophagus. No worrisome mediastinal, hilar or axillary adenopathy. Lungs/Pleura: There is a trace anterior apical right pneumothorax (3/10). Small right pleural effusion posteriorly. No left pneumothorax or effusion. Some ground-glass in the adjacent posterior bilateral lower lobes, right greater than left, may reflect combination of atelectasis and contusive changes with multiple rib fractures detailed below. No other acute traumatic abnormality of the lung parenchyma. Musculoskeletal: Anterior wedging compression deformity at T1 (AO spine A1) with 40% height loss anteriorly. Pincer type deformity of T2 with 60% height loss (AO spine A2). Incomplete burst fracture T3 superior endplate with up to [id number] height loss (AO spine A3). 2 mm of retropulsion of the superior endplate fracture fragments. Atypical widening of the spinous processes of T2-3 and T3-4 some adjacent stranding and thickening, could suggest some underlying disruption of the posterior tension band. Fracture of the right transverse process T3 (AO spine A0). Multiple contiguous posterior 4th through 11th right rib fractures including additional lateral segmental fractures of the fourth through eighth ribs. Contiguous posterior fractures of the left 3rd to 12th ribs with more lateral segmental components of the ninth through eleventh ribs. There is a highly comminuted and displaced fracture of the left scapula extending predominantly  through the inferior scapular body extending to the base of the scapular spine and glenoid. There is associated intramuscular contusion in likely hemorrhage within the infraspinatus and subscapularis muscle bellies. CT ABDOMEN PELVIS FINDINGS Hepatobiliary: No direct hepatic injury or perihepatic hematoma. No focal liver abnormality is seen. No gallstones, gallbladder wall thickening, or biliary dilatation. Pancreas: No direct pancreatic contusive change. Unremarkable. No pancreatic ductal dilatation or surrounding inflammatory changes. Spleen: No direct splenic injury or perisplenic hematoma. Normal splenic size. No concerning splenic lesion. Adrenals/Urinary Tract: Normal adrenal glands, no evidence of adrenal hematoma or hemorrhage. No direct renal lesion or perinephric hemorrhage. Areas of cortical scarring throughout the right kidney are present. No suspicious renal lesion, urolithiasis or hydronephrosis. No Urinary bladder is largely decompressed at the time of exam and therefore poorly evaluated by CT imaging. No evidence of direct bladder injury. Stomach/Bowel: Motion artifact may limit detection of subtle bowel and mesenteric abnormalities. Distal esophagus, stomach and duodenal sweep are unremarkable. No small bowel wall thickening or dilatation. No evidence of obstruction. Fecalized contents of the distal small bowel may reflect some slowed intestinal transit. The appendix is not well visualized. No colonic dilatation or wall thickening. No direct mesenteric contusion or hemorrhage is evident. Vascular/Lymphatic: No acute aortic injury or dissection is seen. There is a 3.4 cm infrarenal abdominal aortic aneurysm without periaortic stranding or hemorrhage. Atherosclerotic calcifications noted throughout the abdominal aorta and branch vessels. No suspicious or enlarged lymph nodes in the included lymphatic chains. Reproductive: The prostate and seminal vesicles are unremarkable. Other: There is a small  volume of intermediate attenuation (27 HU) fluid in the deep pelvis without a clear source of bleeding or hemorrhage. Musculoskeletal: Dedicated lumbar reconstructions were generated and dictated separately. In brief there is a likely fracture of the left L1 and L2 transverse processes without vertebral body fracture or height loss. The bones of pelvis appear intact and congruent without acute osseous injury. IMPRESSION: Traumatic Findings: 1. No acute intracranial abnormality. 2. No acute cervical fracture or traumatic listhesis. 3. Anterior wedging compression deformity of the T1  vertebral level (AO spine A1) with up to 45% height loss anteriorly. 4. Pincer type deformity of T2 with up to 60% height loss (AO spine A2). 5. Incomplete burst fracture superior endplate T3 (AO spine A3). Up to 2 mm of retropulsion and at most mild canal stenosis. 6. Fracture of the right transverse process T3 (AO spine A0). 7. Widening of the spinous processes of T2-3 and T3-4. Cannot exclude disruption of the posterior tension band. Consider MRI for further assessment. 8. Comminuted and displaced fracture of the left scapula involving the inferior scapular body extending to base of the scapular spine and glenoid. Associated intramuscular contusion/hemorrhage within the infraspinatus and subscapularis. 9. Contiguous fourth through eleventh right rib fractures including segmental fractures the fourth through eighth ribs. Contiguous third through eleventh left rib fractures including segmental fractures of the left ninth through eleventh ribs. Given the extent of the segmental rib fractures assessment for flail chest morphology is recommended. 10. Trace anterior apical right pneumothorax.  Small right effusion. 11. Dependent ground-glass in both lungs may reflect a combination of atelectasis and or minimal pulmonary contusive change. 12. Fractures of the L1 and L2 left transverse processes. 13. Small volume of intermediate attenuation  fluid in the abdomen and pelvis without clear source such as direct bowel injury or mesenteric hematoma or contusion though motion artifact may limit detection of subtle bowel and mesenteric anomaly. Recommend close clinical monitoring and serial abdominal examinations. Nontraumatic Findings: 1. 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 2. Multilevel cervical spondylitic changes as well as additional multilevel degenerative changes in the thoracic and lumbar spine. 3. Aortic Atherosclerosis (ICD10-I70.0). 4. 3.4 cm infrarenal abdominal aortic aneurysm. Recommend followup by abdomen and pelvis CTA in 6 months, and vascular surgery referral/consultation if not already obtained. This recommendation follows ACR consensus guidelines: White Paper of the ACR Incidental Findings Committee II on Vascular Findings. J Am Coll Radiol 2013; 10:789-794. Aortic aneurysm NOS (ICD10-I71.9) 5. Coronary artery calcifications are present. Please note that the presence of coronary artery calcium documents the presence of coronary artery disease, the severity of this disease and any potential stenosis cannot be assessed on this non-gated CT examination. 6. Cortical scarring seen in the right kidney. These results were called by telephone at the time of interpretation on 04/03/2020 at 10:27 pm to provider Raylea Adcox , who verbally acknowledged these results. Electronically Signed: By: Lovena Le M.D. On: 04/03/2020 22:48   CT ABDOMEN PELVIS W CONTRAST  Addendum Date: 04/03/2020   ADDENDUM REPORT: 04/03/2020 23:05 ADDENDUM: Error identified in the physician notification attestation below -- should read: These results were called by telephone at the time of interpretation on 04/03/2020 at 10:27 pm to provider Dr. Kae Heller of surgery, who verbally acknowledged these results. Electronically Signed   By: Lovena Le M.D.   On: 04/03/2020 23:05   Result Date: 04/03/2020 CLINICAL DATA:  Level 1  trauma: Fell 20 feet from tree. EXAM: CT HEAD WITHOUT CONTRAST CT CERVICAL SPINE WITHOUT CONTRAST CT CHEST, ABDOMEN AND PELVIS WITH CONTRAST TECHNIQUE: Contiguous axial images were obtained from the base of the skull through the vertex without intravenous contrast. Multidetector CT imaging of the cervical spine was performed without intravenous contrast. Multiplanar CT image reconstructions were also generated. Multidetector CT imaging of the chest, abdomen and pelvis was performed following the standard protocol during bolus administration of intravenous contrast. CONTRAST:  100 cc Omnipaque 350 IV COMPARISON:  Portable chest and pelvic radiograph same day FINDINGS: CT HEAD FINDINGS  Brain: Likely remote lacune in the left external capsule/insula. No evidence of acute infarction, hemorrhage, hydrocephalus, extra-axial collection or mass lesion/mass effect. Symmetric prominence of the ventricles, cisterns and sulci compatible with parenchymal volume loss. Patchy areas of white matter hypoattenuation are most compatible with chronic microvascular angiopathy. Vascular: Atherosclerotic calcification of the carotid siphons. No hyperdense vessel. Skull: No focal scalp swelling or hematoma. No calvarial fracture or suspicious osseous lesion. Punctate dermal hyperdensities in the right frontal and left frontal scalp may reflect debris versus benign dermal calcification. Sinuses/Orbits: Minimal pneumatized secretions in the right maxillary sinus. Remaining paranasal sinuses mastoid air cells are predominantly clear. Middle ear cavities are clear. Ossicular chains appear normally configured. Included orbital structures are unremarkable. Other: None CT CERVICAL SPINE FINDINGS Alignment: Preservation of the normal cervical lordosis. Minimal retrolisthesis C3 on C4 and C5 on C6 are likely on a degenerative basis given spondylitic and facet degenerative changes at these levels. No evidence of traumatic listhesis. No abnormally  widened, perched or jumped facets. Asymmetric left facet arthropathy is noted C4-C6. Normal alignment of the craniocervical and atlantoaxial articulations. Skull base and vertebrae: No acute fracture of the skull base or cervical vertebral bodies. Anterior wedging compression deformity of the T1 vertebral level (AO spine A1). Compression deformity of T2 is better detailed on chest CT and dedicated thoracic recon Speer posterior elements are intact. No worrisome osseous lesions. Soft tissues and spinal canal: No pre or paravertebral fluid or swelling. No visible canal hematoma. Disc levels: Multilevel intervertebral disc height loss with spondylitic endplate changes. Disc osteophyte complexes are present throughout the cervical spine with at most mild canal narrowing at the C5-6 and C6-7 levels secondary to posterior disc osteophyte complexes. CT CHEST FINDINGS Cardiovascular: The aortic root is suboptimally assessed given cardiac pulsation artifact. Calcifications upon the aortic leaflets. Atherosclerotic plaque within the normal caliber aorta. No intramural hematoma, dissection flap or other acute luminal abnormality of the aorta is seen. No periaortic stranding or hemorrhage. Shared origin of the brachiocephalic and left common carotid artery. Tortuosity of the brachiocephalic vasculature with minimal plaque in the proximal great vessels which are otherwise normally opacified. Central pulmonary arteries are normal caliber no large central filling defects though poorly assessed given suboptimal contrast timing for evaluation of the pulmonary arteries. Normal heart size. No pericardial effusion. Coronary artery atherosclerosis is present. Mediastinum/Nodes: No mediastinal fluid or gas. Normal thyroid gland and thoracic inlet. No acute abnormality of the trachea or esophagus. No worrisome mediastinal, hilar or axillary adenopathy. Lungs/Pleura: There is a trace anterior apical right pneumothorax (3/10). Small right  pleural effusion posteriorly. No left pneumothorax or effusion. Some ground-glass in the adjacent posterior bilateral lower lobes, right greater than left, may reflect combination of atelectasis and contusive changes with multiple rib fractures detailed below. No other acute traumatic abnormality of the lung parenchyma. Musculoskeletal: Anterior wedging compression deformity at T1 (AO spine A1) with 40% height loss anteriorly. Pincer type deformity of T2 with 60% height loss (AO spine A2). Incomplete burst fracture T3 superior endplate with up to D34-534 height loss (AO spine A3). 2 mm of retropulsion of the superior endplate fracture fragments. Atypical widening of the spinous processes of T2-3 and T3-4 some adjacent stranding and thickening, could suggest some underlying disruption of the posterior tension band. Fracture of the right transverse process T3 (AO spine A0). Multiple contiguous posterior 4th through 11th right rib fractures including additional lateral segmental fractures of the fourth through eighth ribs. Contiguous posterior fractures of the left 3rd to  12th ribs with more lateral segmental components of the ninth through eleventh ribs. There is a highly comminuted and displaced fracture of the left scapula extending predominantly through the inferior scapular body extending to the base of the scapular spine and glenoid. There is associated intramuscular contusion in likely hemorrhage within the infraspinatus and subscapularis muscle bellies. CT ABDOMEN PELVIS FINDINGS Hepatobiliary: No direct hepatic injury or perihepatic hematoma. No focal liver abnormality is seen. No gallstones, gallbladder wall thickening, or biliary dilatation. Pancreas: No direct pancreatic contusive change. Unremarkable. No pancreatic ductal dilatation or surrounding inflammatory changes. Spleen: No direct splenic injury or perisplenic hematoma. Normal splenic size. No concerning splenic lesion. Adrenals/Urinary Tract: Normal  adrenal glands, no evidence of adrenal hematoma or hemorrhage. No direct renal lesion or perinephric hemorrhage. Areas of cortical scarring throughout the right kidney are present. No suspicious renal lesion, urolithiasis or hydronephrosis. No Urinary bladder is largely decompressed at the time of exam and therefore poorly evaluated by CT imaging. No evidence of direct bladder injury. Stomach/Bowel: Motion artifact may limit detection of subtle bowel and mesenteric abnormalities. Distal esophagus, stomach and duodenal sweep are unremarkable. No small bowel wall thickening or dilatation. No evidence of obstruction. Fecalized contents of the distal small bowel may reflect some slowed intestinal transit. The appendix is not well visualized. No colonic dilatation or wall thickening. No direct mesenteric contusion or hemorrhage is evident. Vascular/Lymphatic: No acute aortic injury or dissection is seen. There is a 3.4 cm infrarenal abdominal aortic aneurysm without periaortic stranding or hemorrhage. Atherosclerotic calcifications noted throughout the abdominal aorta and branch vessels. No suspicious or enlarged lymph nodes in the included lymphatic chains. Reproductive: The prostate and seminal vesicles are unremarkable. Other: There is a small volume of intermediate attenuation (27 HU) fluid in the deep pelvis without a clear source of bleeding or hemorrhage. Musculoskeletal: Dedicated lumbar reconstructions were generated and dictated separately. In brief there is a likely fracture of the left L1 and L2 transverse processes without vertebral body fracture or height loss. The bones of pelvis appear intact and congruent without acute osseous injury. IMPRESSION: Traumatic Findings: 1. No acute intracranial abnormality. 2. No acute cervical fracture or traumatic listhesis. 3. Anterior wedging compression deformity of the T1 vertebral level (AO spine A1) with up to 45% height loss anteriorly. 4. Pincer type deformity of  T2 with up to 60% height loss (AO spine A2). 5. Incomplete burst fracture superior endplate T3 (AO spine A3). Up to 2 mm of retropulsion and at most mild canal stenosis. 6. Fracture of the right transverse process T3 (AO spine A0). 7. Widening of the spinous processes of T2-3 and T3-4. Cannot exclude disruption of the posterior tension band. Consider MRI for further assessment. 8. Comminuted and displaced fracture of the left scapula involving the inferior scapular body extending to base of the scapular spine and glenoid. Associated intramuscular contusion/hemorrhage within the infraspinatus and subscapularis. 9. Contiguous fourth through eleventh right rib fractures including segmental fractures the fourth through eighth ribs. Contiguous third through eleventh left rib fractures including segmental fractures of the left ninth through eleventh ribs. Given the extent of the segmental rib fractures assessment for flail chest morphology is recommended. 10. Trace anterior apical right pneumothorax.  Small right effusion. 11. Dependent ground-glass in both lungs may reflect a combination of atelectasis and or minimal pulmonary contusive change. 12. Fractures of the L1 and L2 left transverse processes. 13. Small volume of intermediate attenuation fluid in the abdomen and pelvis without clear source such as direct  bowel injury or mesenteric hematoma or contusion though motion artifact may limit detection of subtle bowel and mesenteric anomaly. Recommend close clinical monitoring and serial abdominal examinations. Nontraumatic Findings: 1. 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 2. Multilevel cervical spondylitic changes as well as additional multilevel degenerative changes in the thoracic and lumbar spine. 3. Aortic Atherosclerosis (ICD10-I70.0). 4. 3.4 cm infrarenal abdominal aortic aneurysm. Recommend followup by abdomen and pelvis CTA in 6 months, and vascular surgery  referral/consultation if not already obtained. This recommendation follows ACR consensus guidelines: White Paper of the ACR Incidental Findings Committee II on Vascular Findings. J Am Coll Radiol 2013; 10:789-794. Aortic aneurysm NOS (ICD10-I71.9) 5. Coronary artery calcifications are present. Please note that the presence of coronary artery calcium documents the presence of coronary artery disease, the severity of this disease and any potential stenosis cannot be assessed on this non-gated CT examination. 6. Cortical scarring seen in the right kidney. These results were called by telephone at the time of interpretation on 04/03/2020 at 10:27 pm to provider Marquisa Salih , who verbally acknowledged these results. Electronically Signed: By: Lovena Le M.D. On: 04/03/2020 22:48   DG Pelvis Portable  Result Date: 04/03/2020 CLINICAL DATA:  [place] out of tree, pain EXAM: PORTABLE PELVIS 1-2 VIEWS COMPARISON:  None. FINDINGS: Supine frontal view of the pelvis including both hips demonstrates no displaced fractures. Alignment is anatomic. Soft tissues are unremarkable. IMPRESSION: 1. No acute pelvic fracture. Electronically Signed   By: Randa Ngo M.D.   On: 04/03/2020 21:52   CT T-SPINE NO CHARGE  Result Date: 04/03/2020 CLINICAL DATA:  Level 1 trauma: Fell 20 feet from tree EXAM: CT THORACIC, AND LUMBAR SPINE WITHOUT CONTRAST TECHNIQUE: Multiplanar CT reconstructions of the thoracic and lumbar spine were generated from contemporary CT of the chest, abdomen and pelvis in the setting of trauma. COMPARISON:  Contemporary CT of the chest, abdomen and pelvis, radiograph of the chest and pelvis FINDINGS: CT THORACIC SPINE FINDINGS Alignment: Mild straightening of the normal lower thoracic kyphosis with slight exaggeration superiorly. Mild atypical widening of the T2-3 and T3-4 spinous processes beyond what is expected for the kyphotic curvature. Vertebrae: Multiple osseous injuries include: *Anterior wedging  compression deformity at T1 with 40% height loss anteriorly (AOSpine A1). *Pincer type deformity of T2 with 60% height loss (AOSpine A2). *Incomplete burst fracture T3 superior endplate with up to D34-534 height loss (AO spine A3). 2 mm of retropulsion of the superior endplate fracture fragments. *Fracture of the right transverse process T3 (AO spine A0). *Thin sclerotic bands seen in the superior endplate T5 and T8 and extending from the inferior plate of T6 are indeterminate but could reflect subcortical impaction fracture in the setting of significant trauma. Would be better assessed with MR. *Multiple contiguous bilateral rib fractures better detailed on dedicated CT of the chest from which this study is reconstructed. Paraspinal and other soft tissues: The thickening and likely trace hemorrhage adjacent the T1-T3 compression deformities. No other paraspinal fluid or swelling. No visible canal hematoma. Slight bowing of the supraspinous ligament T2-3. Some adjacent soft tissue stranding though this may be related to posterior contusive changes in a comminuted left scapular fracture. Disc levels: Retropulsion of fracture fragments at T3 result in some minimal effacement of the ventral thecal sac without significant canal stenosis. No other significant narrowing or foraminal stenosis is seen within the thoracic spine. CT LUMBAR SPINE FINDINGS Segmentation: 5 lumbar type vertebrae are present. Alignment: Slight straightening of the  normal lumbar lordosis. Mild retrolisthesis L3 on 4 of approximately 2 mm may be degenerative with some early discogenic change at this level. No associated spondylolysis. Vertebrae: Minimally displaced fractures of the left T1 and T2 transverse processes. No vertebral body fracture or height loss is evident. Multilevel discogenic changes as detailed below.Included portions of the bony pelvis are intact. Paraspinal and other soft tissues: Small amount of thickening in stranding adjacent the  left T1 and T2 transverse processes. No over paraspinal thickening swelling or hemorrhage. Some mild contusive changes noted posteriorly. For additional findings in the abdomen and pelvis, please see dedicated CT chest, abdomen and pelvis from which this study is reconstructed. Disc levels: Multilevel intervertebral disc height loss is present with some mild global disc bulging most pronounced C3-4 and C4-5 where there is resulting mild canal stenosis. A shallow calcified central disc protrusion is noted at L5-S1 without significant resulting stenosis. Aforementioned disc bulges and mild facet degenerative changes result in mild bilateral foraminal narrowing C3-4 and C4-5. IMPRESSION: CT THORACIC SPINE 1. Anterior wedging compression deformity at T1 with 40% height loss anteriorly (AOSpine A1). 2. Pincer type deformity of T2 with 60% height loss (AOSpine A2). 3. Incomplete burst fracture T3 superior endplate with up to D34-534 height loss (AOSpine A3) and of the right transverse process T3 (AOSpine A0). 4. Widening of the interspinous distances T2-3 and T3-4 slightly greater than expected for the degree of kyphosis. Given the vertebral body fractures at these levels, involvement of the posterior tension band is not fully excluded. Consider further evaluation with MRI. 5. Thin sclerotic bands in the superior endplate T5 and T8 and extending from the inferior plate of T6 are indeterminate but could reflect subcortical impaction fracture in the setting of significant trauma. CT LUMBAR SPINE 1. Nondisplaced fractures of the left L1 and L2 transverse processes. 2. No acute vertebral body fracture or vertebral body height loss identified. 3. Multilevel discogenic changes most pronounced C3-4 and C4-5 with resulting mild canal stenosis. 4. Mild retrolisthesis L3 on 4 may be degenerative with some early discogenic change at this level. For additional findings within the chest, abdomen and pelvis, please see dedicated CT from  which this study is reconstructed. These results were called by telephone at the time of interpretation on 04/03/2020 at 10: 27 pm to provider MD Kae Heller of surgery , who verbally acknowledged these results. Electronically Signed   By: Lovena Le M.D.   On: 04/03/2020 23:04   CT L-SPINE NO CHARGE  Result Date: 04/03/2020 CLINICAL DATA:  Level 1 trauma: Fell 20 feet from tree EXAM: CT THORACIC, AND LUMBAR SPINE WITHOUT CONTRAST TECHNIQUE: Multiplanar CT reconstructions of the thoracic and lumbar spine were generated from contemporary CT of the chest, abdomen and pelvis in the setting of trauma. COMPARISON:  Contemporary CT of the chest, abdomen and pelvis, radiograph of the chest and pelvis FINDINGS: CT THORACIC SPINE FINDINGS Alignment: Mild straightening of the normal lower thoracic kyphosis with slight exaggeration superiorly. Mild atypical widening of the T2-3 and T3-4 spinous processes beyond what is expected for the kyphotic curvature. Vertebrae: Multiple osseous injuries include: *Anterior wedging compression deformity at T1 with 40% height loss anteriorly (AOSpine A1). *Pincer type deformity of T2 with 60% height loss (AOSpine A2). *Incomplete burst fracture T3 superior endplate with up to D34-534 height loss (AO spine A3). 2 mm of retropulsion of the superior endplate fracture fragments. *Fracture of the right transverse process T3 (AO spine A0). *Thin sclerotic bands seen in the superior endplate  T5 and T8 and extending from the inferior plate of T6 are indeterminate but could reflect subcortical impaction fracture in the setting of significant trauma. Would be better assessed with MR. *Multiple contiguous bilateral rib fractures better detailed on dedicated CT of the chest from which this study is reconstructed. Paraspinal and other soft tissues: The thickening and likely trace hemorrhage adjacent the T1-T3 compression deformities. No other paraspinal fluid or swelling. No visible canal hematoma. Slight  bowing of the supraspinous ligament T2-3. Some adjacent soft tissue stranding though this may be related to posterior contusive changes in a comminuted left scapular fracture. Disc levels: Retropulsion of fracture fragments at T3 result in some minimal effacement of the ventral thecal sac without significant canal stenosis. No other significant narrowing or foraminal stenosis is seen within the thoracic spine. CT LUMBAR SPINE FINDINGS Segmentation: 5 lumbar type vertebrae are present. Alignment: Slight straightening of the normal lumbar lordosis. Mild retrolisthesis L3 on 4 of approximately 2 mm may be degenerative with some early discogenic change at this level. No associated spondylolysis. Vertebrae: Minimally displaced fractures of the left T1 and T2 transverse processes. No vertebral body fracture or height loss is evident. Multilevel discogenic changes as detailed below.Included portions of the bony pelvis are intact. Paraspinal and other soft tissues: Small amount of thickening in stranding adjacent the left T1 and T2 transverse processes. No over paraspinal thickening swelling or hemorrhage. Some mild contusive changes noted posteriorly. For additional findings in the abdomen and pelvis, please see dedicated CT chest, abdomen and pelvis from which this study is reconstructed. Disc levels: Multilevel intervertebral disc height loss is present with some mild global disc bulging most pronounced C3-4 and C4-5 where there is resulting mild canal stenosis. A shallow calcified central disc protrusion is noted at L5-S1 without significant resulting stenosis. Aforementioned disc bulges and mild facet degenerative changes result in mild bilateral foraminal narrowing C3-4 and C4-5. IMPRESSION: CT THORACIC SPINE 1. Anterior wedging compression deformity at T1 with 40% height loss anteriorly (AOSpine A1). 2. Pincer type deformity of T2 with 60% height loss (AOSpine A2). 3. Incomplete burst fracture T3 superior endplate  with up to D34-534 height loss (AOSpine A3) and of the right transverse process T3 (AOSpine A0). 4. Widening of the interspinous distances T2-3 and T3-4 slightly greater than expected for the degree of kyphosis. Given the vertebral body fractures at these levels, involvement of the posterior tension band is not fully excluded. Consider further evaluation with MRI. 5. Thin sclerotic bands in the superior endplate T5 and T8 and extending from the inferior plate of T6 are indeterminate but could reflect subcortical impaction fracture in the setting of significant trauma. CT LUMBAR SPINE 1. Nondisplaced fractures of the left L1 and L2 transverse processes. 2. No acute vertebral body fracture or vertebral body height loss identified. 3. Multilevel discogenic changes most pronounced C3-4 and C4-5 with resulting mild canal stenosis. 4. Mild retrolisthesis L3 on 4 may be degenerative with some early discogenic change at this level. For additional findings within the chest, abdomen and pelvis, please see dedicated CT from which this study is reconstructed. These results were called by telephone at the time of interpretation on 04/03/2020 at 10: 27 pm to provider MD Kae Heller of surgery , who verbally acknowledged these results. Electronically Signed   By: Lovena Le M.D.   On: 04/03/2020 23:04   DG Chest Port 1 View  Result Date: 04/03/2020 CLINICAL DATA:  [place] [address] 20 feet, short of breath, left-sided chest pain EXAM: PORTABLE  CHEST 1 VIEW COMPARISON:  None. FINDINGS: Single frontal view of the chest demonstrates an unremarkable cardiac silhouette. No airspace disease, effusion, or pneumothorax on this supine evaluation. There is a minimally displaced right lateral seventh rib fracture. There is lucency through the infraspinous region of the scapula which may reflect fracture. IMPRESSION: 1. Suspected left scapular fracture. 2. Minimally displaced right lateral seventh rib fracture. 3. Otherwise no acute intrathoracic process.  Electronically Signed   By: Randa Ngo M.D.   On: 04/03/2020 21:50    Procedures Procedures (including critical care time)  Medications Ordered in ED Medications  0.9 %  sodium chloride infusion ( Intravenous New Bag/Given 04/03/20 2341)  metoprolol tartrate (LOPRESSOR) injection 5 mg (has no administration in time range)  hydrALAZINE (APRESOLINE) injection 10 mg (has no administration in time range)  pantoprazole (PROTONIX) EC tablet 40 mg (has no administration in time range)    Or  pantoprazole (PROTONIX) injection 40 mg (has no administration in time range)  ondansetron (ZOFRAN-ODT) disintegrating tablet 4 mg (has no administration in time range)    Or  ondansetron (ZOFRAN) injection 4 mg (has no administration in time range)  docusate sodium (COLACE) capsule 100 mg (100 mg Oral Given 04/03/20 2307)  acetaminophen (TYLENOL) tablet 650 mg (650 mg Oral Given 04/03/20 2307)  gabapentin (NEURONTIN) capsule 300 mg (300 mg Oral Given 04/03/20 2307)  HYDROmorphone (DILAUDID) injection 0.5 mg (has no administration in time range)  ibuprofen (ADVIL) tablet 800 mg (800 mg Oral Given 04/03/20 2307)  methocarbamol (ROBAXIN) tablet 500 mg (has no administration in time range)  oxyCODONE (Oxy IR/ROXICODONE) immediate release tablet 5 mg (has no administration in time range)  Chlorhexidine Gluconate Cloth 2 % PADS 6 each (has no administration in time range)  chlorhexidine (PERIDEX) 0.12 % solution 15 mL (has no administration in time range)  fentaNYL (SUBLIMAZE) injection 50 mcg (50 mcg Intravenous Given 04/03/20 2157)  iohexol (OMNIPAQUE) 300 MG/ML solution 100 mL (100 mLs Intravenous Contrast Given 04/03/20 2204)    ED Course  I have reviewed the triage vital signs and the nursing notes.  Pertinent labs & imaging results that were available during my care of the patient were reviewed by me and considered in my medical decision making (see chart for details).    MDM Rules/Calculators/A&P                       Jordan [name] is a 64 year old male with no significant medical history presents to the ED as a level 1 trauma after a fall from 20 feet.  Patient with unremarkable vitals.  Had lethargy and mental status change in the field and therefore was made a level 1.  Having respiratory distress especially with deep inspiration.  Crepitus to the right side of his chest.  Patient with airway intact.  Had bilateral coarse breath sounds.  Good circulation.  Some pain mostly to the right side of his chest wall.  Some left shoulder pain as well.  Trauma surgery at the bedside upon arrival.  Chest x-ray without any obvious large pneumothorax.  Pelvic x-ray without any fracture.  Trauma scans were ordered.  Patient with multiple injuries.  Multiple thoracic spine fractures.  Including burst fracture of T3.  Multiple spinous process fractures in the thoracic spine.  Patient with comminuted and displaced fracture of the left scapula.  Patient also with 4 through 11 right rib fractures.  Possibly some aspects of flail chest on the right as well.  Has a trace apical pneumothorax.  Pulmonary contusions already seen.  Patient was given IV narcotics.  Admitted to trauma surgery for further care.  This chart was dictated using voice recognition software.  Despite best efforts to proofread,  errors can occur which can change the documentation meaning.    Final Clinical Impression(s) / ED Diagnoses Final diagnoses:  Pain  Trauma  Closed fracture of five ribs of right side, initial encounter  Closed fracture of thoracic vertebra, unspecified fracture morphology, unspecified thoracic vertebral level, initial encounter [facility])  Traumatic pneumothorax, initial encounter    Rx / DC Orders ED Discharge Orders    None       Lennice Sites, DO 04/03/20 2352

## 2020-04-03 NOTE — ED Notes (Signed)
Pt c-collar changed into [place] J collar due to continued neck tenderness.

## 2020-04-03 NOTE — H&P (Signed)
Trauma Evaluation  Chief Complaint: fall  HPI: 64yo male who sustained a approximately 20 foot fall from a tree while at work today around 12 PM (9+ hours prior to presentation).  He continued to work until the end of his day and then went home.  He reports shallow breathing since the incident, but developed worsening shortness of breath, chest pain and shoulder pain and contacted EMS.  By report he was originally walking around but became dizzy and more lethargic en route.  He was hypertensive up to [id number] systolic on route, sats have remained in the mid 90s, no tachycardia.  He does smoke crack cocaine and has done so since his fall today.  No Known Allergies  History reviewed. No pertinent past medical history.  He states he has had surgeries in the past but is unable to say what those are at this time.  Family history positive for COPD in his father  Social History   Socioeconomic History  . Marital status: Divorced    Spouse name: Not on file  . Number of children: Not on file  . Years of education: Not on file  . Highest education level: Not on file  Occupational History  . Not on file  Tobacco Use  . Smoking status: Never Smoker  . Smokeless tobacco: Never Used  Substance and Sexual Activity  . Alcohol use: Never  . Drug use: Yes    Types: Cocaine  . Sexual activity: Not on file  Other Topics Concern  . Not on file  Social History Narrative  . Not on file   Social Determinants of Health   Financial Resource Strain:   . Difficulty of Paying Living Expenses:   Food Insecurity:   . Worried About Charity fundraiser in the Last Year:   . Arboriculturist in the Last Year:   Transportation Needs:   . Film/video editor (Medical):   Marland Kitchen Lack of Transportation (Non-Medical):   Physical Activity:   . Days of Exercise per Week:   . Minutes of Exercise per Session:   Stress:   . Feeling of Stress :   Social Connections:   . Frequency of Communication with Friends and  Family:   . Frequency of Social Gatherings with Friends and Family:   . Attends Religious Services:   . Active Member of Clubs or Organizations:   . Attends Archivist Meetings:   Marland Kitchen Marital Status:     No current facility-administered medications on file prior to encounter.   No current outpatient medications on file prior to encounter.    Review of Systems: a complete, 10pt review of systems was completed with pertinent positives and negatives as documented in the HPI  Physical Exam: Vitals:   04/03/20 2230 04/03/20 2245  BP:    Pulse: 81 77  Resp: (!) 25 (!) 23  Temp:    SpO2: 100% 98%   Gen: Groggy but easily arouses to voice, cooperative Eyes: lids and conjunctivae normal, no icterus. Pupils equally round and reactive to light.  Neck: No mass or thyromegaly.  Trachea is midline, no crepitus or hematoma.  There is c-spine tenderness without deformity.  C-collar in place Chest: Tachypneic to 30.  There is crepitus and tenderness on the lateral right chest wall. Breath sounds rhonchorous bilaterally but equal.  No tenderness or deformity on palpation of the clavicles. Upper T-spine tenderness and left shoulder tenderness posteriorly. Cardiovascular: RRR with palpable distal pulses, no pedal edema Gastrointestinal:  soft, nondistended, nontender. No mass, hepatomegaly or splenomegaly. No hernia. Lymphatic: no lymphadenopathy in the neck or groin Muscoloskeletal: no clubbing or cyanosis of the fingers.  Strength is symmetrical throughout.  Range of motion of bilateral upper and lower extremities normal without pain, crepitation or contracture.  No tenderness to the long bones or joints. Neuro: cranial nerves grossly intact.  Sensation intact to light touch diffusely.  GCS 14 (eyes) Psych: appropriate mood and affect, insight/judgment unable to assess Skin: warm and dry   CBC Latest Ref Rng & Units 04/03/2020 04/03/2020  WBC 4.0 - 10.5 K/uL 7.9 -  Hemoglobin 13.0 - 17.0  g/dL 15.5 16.3  Hematocrit 39.0 - 52.0 % 46.7 48.0  Platelets 150 - 400 K/uL 167 -    CMP Latest Ref Rng & Units 04/03/2020 04/03/2020  Glucose 70 - 99 mg/dL 149(H) 134(H)  BUN 8 - 23 mg/dL 16 18  Creatinine 0.61 - 1.24 mg/dL 1.20 1.10  Sodium 135 - 145 mmol/L 134(L) 138  Potassium 3.5 - 5.1 mmol/L 4.2 4.1  Chloride 98 - 111 mmol/L 98 101  CO2 22 - 32 mmol/L 24 -  Calcium 8.9 - 10.3 mg/dL 8.4(L) -  Total Protein 6.5 - 8.1 g/dL 6.6 -  Total Bilirubin 0.3 - 1.2 mg/dL 1.0 -  Alkaline Phos 38 - 126 U/L 64 -  AST 15 - 41 U/L 91(H) -  ALT 0 - 44 U/L 71(H) -    Lab Results  Component Value Date   INR 1.0 04/03/2020    Imaging: CT HEAD WO CONTRAST  Result Date: 04/03/2020 CLINICAL DATA:  Level 1 trauma: Fell 20 feet from tree. EXAM: CT HEAD WITHOUT CONTRAST CT CERVICAL SPINE WITHOUT CONTRAST CT CHEST, ABDOMEN AND PELVIS WITH CONTRAST TECHNIQUE: Contiguous axial images were obtained from the base of the skull through the vertex without intravenous contrast. Multidetector CT imaging of the cervical spine was performed without intravenous contrast. Multiplanar CT image reconstructions were also generated. Multidetector CT imaging of the chest, abdomen and pelvis was performed following the standard protocol during bolus administration of intravenous contrast. CONTRAST:  100 cc Omnipaque 350 IV COMPARISON:  Portable chest and pelvic radiograph same day FINDINGS: CT HEAD FINDINGS Brain: Likely remote lacune in the left external capsule/insula. No evidence of acute infarction, hemorrhage, hydrocephalus, extra-axial collection or mass lesion/mass effect. Symmetric prominence of the ventricles, cisterns and sulci compatible with parenchymal volume loss. Patchy areas of white matter hypoattenuation are most compatible with chronic microvascular angiopathy. Vascular: Atherosclerotic calcification of the carotid siphons. No hyperdense vessel. Skull: No focal scalp swelling or hematoma. No calvarial fracture  or suspicious osseous lesion. Punctate dermal hyperdensities in the right frontal and left frontal scalp may reflect debris versus benign dermal calcification. Sinuses/Orbits: Minimal pneumatized secretions in the right maxillary sinus. Remaining paranasal sinuses mastoid air cells are predominantly clear. Middle ear cavities are clear. Ossicular chains appear normally configured. Included orbital structures are unremarkable. Other: None CT CERVICAL SPINE FINDINGS Alignment: Preservation of the normal cervical lordosis. Minimal retrolisthesis C3 on C4 and C5 on C6 are likely on a degenerative basis given spondylitic and facet degenerative changes at these levels. No evidence of traumatic listhesis. No abnormally widened, perched or jumped facets. Asymmetric left facet arthropathy is noted C4-C6. Normal alignment of the craniocervical and atlantoaxial articulations. Skull base and vertebrae: No acute fracture of the skull base or cervical vertebral bodies. Anterior wedging compression deformity of the T1 vertebral level (AO spine A1). Compression deformity of T2 is better detailed  on chest CT and dedicated thoracic recon Speer posterior elements are intact. No worrisome osseous lesions. Soft tissues and spinal canal: No pre or paravertebral fluid or swelling. No visible canal hematoma. Disc levels: Multilevel intervertebral disc height loss with spondylitic endplate changes. Disc osteophyte complexes are present throughout the cervical spine with at most mild canal narrowing at the C5-6 and C6-7 levels secondary to posterior disc osteophyte complexes. CT CHEST FINDINGS Cardiovascular: The aortic root is suboptimally assessed given cardiac pulsation artifact. Calcifications upon the aortic leaflets. Atherosclerotic plaque within the normal caliber aorta. No intramural hematoma, dissection flap or other acute luminal abnormality of the aorta is seen. No periaortic stranding or hemorrhage. Shared origin of the  brachiocephalic and left common carotid artery. Tortuosity of the brachiocephalic vasculature with minimal plaque in the proximal great vessels which are otherwise normally opacified. Central pulmonary arteries are normal caliber no large central filling defects though poorly assessed given suboptimal contrast timing for evaluation of the pulmonary arteries. Normal heart size. No pericardial effusion. Coronary artery atherosclerosis is present. Mediastinum/Nodes: No mediastinal fluid or gas. Normal thyroid gland and thoracic inlet. No acute abnormality of the trachea or esophagus. No worrisome mediastinal, hilar or axillary adenopathy. Lungs/Pleura: There is a trace anterior apical right pneumothorax (3/10). Small right pleural effusion posteriorly. No left pneumothorax or effusion. Some ground-glass in the adjacent posterior bilateral lower lobes, right greater than left, may reflect combination of atelectasis and contusive changes with multiple rib fractures detailed below. No other acute traumatic abnormality of the lung parenchyma. Musculoskeletal: Anterior wedging compression deformity at T1 (AO spine A1) with 40% height loss anteriorly. Pincer type deformity of T2 with 60% height loss (AO spine A2). Incomplete burst fracture T3 superior endplate with up to D34-534 height loss (AO spine A3). 2 mm of retropulsion of the superior endplate fracture fragments. Atypical widening of the spinous processes of T2-3 and T3-4 some adjacent stranding and thickening, could suggest some underlying disruption of the posterior tension band. Fracture of the right transverse process T3 (AO spine A0). Multiple contiguous posterior 4th through 11th right rib fractures including additional lateral segmental fractures of the fourth through eighth ribs. Contiguous posterior fractures of the left 3rd to 12th ribs with more lateral segmental components of the ninth through eleventh ribs. There is a highly comminuted and displaced fracture  of the left scapula extending predominantly through the inferior scapular body extending to the base of the scapular spine and glenoid. There is associated intramuscular contusion in likely hemorrhage within the infraspinatus and subscapularis muscle bellies. CT ABDOMEN PELVIS FINDINGS Hepatobiliary: No direct hepatic injury or perihepatic hematoma. No focal liver abnormality is seen. No gallstones, gallbladder wall thickening, or biliary dilatation. Pancreas: No direct pancreatic contusive change. Unremarkable. No pancreatic ductal dilatation or surrounding inflammatory changes. Spleen: No direct splenic injury or perisplenic hematoma. Normal splenic size. No concerning splenic lesion. Adrenals/Urinary Tract: Normal adrenal glands, no evidence of adrenal hematoma or hemorrhage. No direct renal lesion or perinephric hemorrhage. Areas of cortical scarring throughout the right kidney are present. No suspicious renal lesion, urolithiasis or hydronephrosis. No Urinary bladder is largely decompressed at the time of exam and therefore poorly evaluated by CT imaging. No evidence of direct bladder injury. Stomach/Bowel: Motion artifact may limit detection of subtle bowel and mesenteric abnormalities. Distal esophagus, stomach and duodenal sweep are unremarkable. No small bowel wall thickening or dilatation. No evidence of obstruction. Fecalized contents of the distal small bowel may reflect some slowed intestinal transit. The appendix is not  well visualized. No colonic dilatation or wall thickening. No direct mesenteric contusion or hemorrhage is evident. Vascular/Lymphatic: No acute aortic injury or dissection is seen. There is a 3.4 cm infrarenal abdominal aortic aneurysm without periaortic stranding or hemorrhage. Atherosclerotic calcifications noted throughout the abdominal aorta and branch vessels. No suspicious or enlarged lymph nodes in the included lymphatic chains. Reproductive: The prostate and seminal vesicles  are unremarkable. Other: There is a small volume of intermediate attenuation (27 HU) fluid in the deep pelvis without a clear source of bleeding or hemorrhage. Musculoskeletal: Dedicated lumbar reconstructions were generated and dictated separately. In brief there is a likely fracture of the left L1 and L2 transverse processes without vertebral body fracture or height loss. The bones of pelvis appear intact and congruent without acute osseous injury. IMPRESSION: Traumatic Findings: 1. No acute intracranial abnormality. 2. No acute cervical fracture or traumatic listhesis. 3. Anterior wedging compression deformity of the T1 vertebral level (AO spine A1) with up to 45% height loss anteriorly. 4. Pincer type deformity of T2 with up to 60% height loss (AO spine A2). 5. Incomplete burst fracture superior endplate T3 (AO spine A3). Up to 2 mm of retropulsion and at most mild canal stenosis. 6. Fracture of the right transverse process T3 (AO spine A0). 7. Widening of the spinous processes of T2-3 and T3-4. Cannot exclude disruption of the posterior tension band. Consider MRI for further assessment. 8. Comminuted and displaced fracture of the left scapula involving the inferior scapular body extending to base of the scapular spine and glenoid. Associated intramuscular contusion/hemorrhage within the infraspinatus and subscapularis. 9. Contiguous fourth through eleventh right rib fractures including segmental fractures the fourth through eighth ribs. Contiguous third through eleventh left rib fractures including segmental fractures of the left ninth through eleventh ribs. Given the extent of the segmental rib fractures assessment for flail chest morphology is recommended. 10. Trace anterior apical right pneumothorax.  Small right effusion. 11. Dependent ground-glass in both lungs may reflect a combination of atelectasis and or minimal pulmonary contusive change. 12. Fractures of the L1 and L2 left transverse processes. 13.  Small volume of intermediate attenuation fluid in the abdomen and pelvis without clear source such as direct bowel injury or mesenteric hematoma or contusion though motion artifact may limit detection of subtle bowel and mesenteric anomaly. Recommend close clinical monitoring and serial abdominal examinations. Nontraumatic Findings: 1. 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 2. Multilevel cervical spondylitic changes as well as additional multilevel degenerative changes in the thoracic and lumbar spine. 3. Aortic Atherosclerosis (ICD10-I70.0). 4. 3.4 cm infrarenal abdominal aortic aneurysm. Recommend followup by abdomen and pelvis CTA in 6 months, and vascular surgery referral/consultation if not already obtained. This recommendation follows ACR consensus guidelines: White Paper of the ACR Incidental Findings Committee II on Vascular Findings. J Am Coll Radiol 2013; 10:789-794. Aortic aneurysm NOS (ICD10-I71.9) 5. Coronary artery calcifications are present. Please note that the presence of coronary artery calcium documents the presence of coronary artery disease, the severity of this disease and any potential stenosis cannot be assessed on this non-gated CT examination. 6. Cortical scarring seen in the right kidney. These results were called by telephone at the time of interpretation on 04/03/2020 at 10:27 pm to provider ADAM CURATOLO , who verbally acknowledged these results. Electronically Signed   By: Lovena Le M.D.   On: 04/03/2020 22:48   CT CHEST W CONTRAST  Result Date: 04/03/2020 CLINICAL DATA:  Level 1 trauma: [place] 20  feet from tree. EXAM: CT HEAD WITHOUT CONTRAST CT CERVICAL SPINE WITHOUT CONTRAST CT CHEST, ABDOMEN AND PELVIS WITH CONTRAST TECHNIQUE: Contiguous axial images were obtained from the base of the skull through the vertex without intravenous contrast. Multidetector CT imaging of the cervical spine was performed without intravenous contrast.  Multiplanar CT image reconstructions were also generated. Multidetector CT imaging of the chest, abdomen and pelvis was performed following the standard protocol during bolus administration of intravenous contrast. CONTRAST:  100 cc Omnipaque 350 IV COMPARISON:  Portable chest and pelvic radiograph same day FINDINGS: CT HEAD FINDINGS Brain: Likely remote lacune in the left external capsule/insula. No evidence of acute infarction, hemorrhage, hydrocephalus, extra-axial collection or mass lesion/mass effect. Symmetric prominence of the ventricles, cisterns and sulci compatible with parenchymal volume loss. Patchy areas of white matter hypoattenuation are most compatible with chronic microvascular angiopathy. Vascular: Atherosclerotic calcification of the carotid siphons. No hyperdense vessel. Skull: No focal scalp swelling or hematoma. No calvarial fracture or suspicious osseous lesion. Punctate dermal hyperdensities in the right frontal and left frontal scalp may reflect debris versus benign dermal calcification. Sinuses/Orbits: Minimal pneumatized secretions in the right maxillary sinus. Remaining paranasal sinuses mastoid air cells are predominantly clear. Middle ear cavities are clear. Ossicular chains appear normally configured. Included orbital structures are unremarkable. Other: None CT CERVICAL SPINE FINDINGS Alignment: Preservation of the normal cervical lordosis. Minimal retrolisthesis C3 on C4 and C5 on C6 are likely on a degenerative basis given spondylitic and facet degenerative changes at these levels. No evidence of traumatic listhesis. No abnormally widened, perched or jumped facets. Asymmetric left facet arthropathy is noted C4-C6. Normal alignment of the craniocervical and atlantoaxial articulations. Skull base and vertebrae: No acute fracture of the skull base or cervical vertebral bodies. Anterior wedging compression deformity of the T1 vertebral level (AO spine A1). Compression deformity of T2 is  better detailed on chest CT and dedicated thoracic recon Speer posterior elements are intact. No worrisome osseous lesions. Soft tissues and spinal canal: No pre or paravertebral fluid or swelling. No visible canal hematoma. Disc levels: Multilevel intervertebral disc height loss with spondylitic endplate changes. Disc osteophyte complexes are present throughout the cervical spine with at most mild canal narrowing at the C5-6 and C6-7 levels secondary to posterior disc osteophyte complexes. CT CHEST FINDINGS Cardiovascular: The aortic root is suboptimally assessed given cardiac pulsation artifact. Calcifications upon the aortic leaflets. Atherosclerotic plaque within the normal caliber aorta. No intramural hematoma, dissection flap or other acute luminal abnormality of the aorta is seen. No periaortic stranding or hemorrhage. Shared origin of the brachiocephalic and left common carotid artery. Tortuosity of the brachiocephalic vasculature with minimal plaque in the proximal great vessels which are otherwise normally opacified. Central pulmonary arteries are normal caliber no large central filling defects though poorly assessed given suboptimal contrast timing for evaluation of the pulmonary arteries. Normal heart size. No pericardial effusion. Coronary artery atherosclerosis is present. Mediastinum/Nodes: No mediastinal fluid or gas. Normal thyroid gland and thoracic inlet. No acute abnormality of the trachea or esophagus. No worrisome mediastinal, hilar or axillary adenopathy. Lungs/Pleura: There is a trace anterior apical right pneumothorax (3/10). Small right pleural effusion posteriorly. No left pneumothorax or effusion. Some ground-glass in the adjacent posterior bilateral lower lobes, right greater than left, may reflect combination of atelectasis and contusive changes with multiple rib fractures detailed below. No other acute traumatic abnormality of the lung parenchyma. Musculoskeletal: Anterior wedging  compression deformity at T1 (AO spine A1) with 40% height loss anteriorly. Pincer  type deformity of T2 with 60% height loss (AO spine A2). Incomplete burst fracture T3 superior endplate with up to D34-534 height loss (AO spine A3). 2 mm of retropulsion of the superior endplate fracture fragments. Atypical widening of the spinous processes of T2-3 and T3-4 some adjacent stranding and thickening, could suggest some underlying disruption of the posterior tension band. Fracture of the right transverse process T3 (AO spine A0). Multiple contiguous posterior 4th through 11th right rib fractures including additional lateral segmental fractures of the fourth through eighth ribs. Contiguous posterior fractures of the left 3rd to 12th ribs with more lateral segmental components of the ninth through eleventh ribs. There is a highly comminuted and displaced fracture of the left scapula extending predominantly through the inferior scapular body extending to the base of the scapular spine and glenoid. There is associated intramuscular contusion in likely hemorrhage within the infraspinatus and subscapularis muscle bellies. CT ABDOMEN PELVIS FINDINGS Hepatobiliary: No direct hepatic injury or perihepatic hematoma. No focal liver abnormality is seen. No gallstones, gallbladder wall thickening, or biliary dilatation. Pancreas: No direct pancreatic contusive change. Unremarkable. No pancreatic ductal dilatation or surrounding inflammatory changes. Spleen: No direct splenic injury or perisplenic hematoma. Normal splenic size. No concerning splenic lesion. Adrenals/Urinary Tract: Normal adrenal glands, no evidence of adrenal hematoma or hemorrhage. No direct renal lesion or perinephric hemorrhage. Areas of cortical scarring throughout the right kidney are present. No suspicious renal lesion, urolithiasis or hydronephrosis. No Urinary bladder is largely decompressed at the time of exam and therefore poorly evaluated by CT imaging. No  evidence of direct bladder injury. Stomach/Bowel: Motion artifact may limit detection of subtle bowel and mesenteric abnormalities. Distal esophagus, stomach and duodenal sweep are unremarkable. No small bowel wall thickening or dilatation. No evidence of obstruction. Fecalized contents of the distal small bowel may reflect some slowed intestinal transit. The appendix is not well visualized. No colonic dilatation or wall thickening. No direct mesenteric contusion or hemorrhage is evident. Vascular/Lymphatic: No acute aortic injury or dissection is seen. There is a 3.4 cm infrarenal abdominal aortic aneurysm without periaortic stranding or hemorrhage. Atherosclerotic calcifications noted throughout the abdominal aorta and branch vessels. No suspicious or enlarged lymph nodes in the included lymphatic chains. Reproductive: The prostate and seminal vesicles are unremarkable. Other: There is a small volume of intermediate attenuation (27 HU) fluid in the deep pelvis without a clear source of bleeding or hemorrhage. Musculoskeletal: Dedicated lumbar reconstructions were generated and dictated separately. In brief there is a likely fracture of the left L1 and L2 transverse processes without vertebral body fracture or height loss. The bones of pelvis appear intact and congruent without acute osseous injury. IMPRESSION: Traumatic Findings: 1. No acute intracranial abnormality. 2. No acute cervical fracture or traumatic listhesis. 3. Anterior wedging compression deformity of the T1 vertebral level (AO spine A1) with up to 45% height loss anteriorly. 4. Pincer type deformity of T2 with up to 60% height loss (AO spine A2). 5. Incomplete burst fracture superior endplate T3 (AO spine A3). Up to 2 mm of retropulsion and at most mild canal stenosis. 6. Fracture of the right transverse process T3 (AO spine A0). 7. Widening of the spinous processes of T2-3 and T3-4. Cannot exclude disruption of the posterior tension band. Consider  MRI for further assessment. 8. Comminuted and displaced fracture of the left scapula involving the inferior scapular body extending to base of the scapular spine and glenoid. Associated intramuscular contusion/hemorrhage within the infraspinatus and subscapularis. 9. Contiguous fourth through eleventh  right rib fractures including segmental fractures the fourth through eighth ribs. Contiguous third through eleventh left rib fractures including segmental fractures of the left ninth through eleventh ribs. Given the extent of the segmental rib fractures assessment for flail chest morphology is recommended. 10. Trace anterior apical right pneumothorax.  Small right effusion. 11. Dependent ground-glass in both lungs may reflect a combination of atelectasis and or minimal pulmonary contusive change. 12. Fractures of the L1 and L2 left transverse processes. 13. Small volume of intermediate attenuation fluid in the abdomen and pelvis without clear source such as direct bowel injury or mesenteric hematoma or contusion though motion artifact may limit detection of subtle bowel and mesenteric anomaly. Recommend close clinical monitoring and serial abdominal examinations. Nontraumatic Findings: 1. 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 2. Multilevel cervical spondylitic changes as well as additional multilevel degenerative changes in the thoracic and lumbar spine. 3. Aortic Atherosclerosis (ICD10-I70.0). 4. 3.4 cm infrarenal abdominal aortic aneurysm. Recommend followup by abdomen and pelvis CTA in 6 months, and vascular surgery referral/consultation if not already obtained. This recommendation follows ACR consensus guidelines: White Paper of the ACR Incidental Findings Committee II on Vascular Findings. J Am Coll Radiol 2013; 10:789-794. Aortic aneurysm NOS (ICD10-I71.9) 5. Coronary artery calcifications are present. Please note that the presence of coronary artery calcium documents  the presence of coronary artery disease, the severity of this disease and any potential stenosis cannot be assessed on this non-gated CT examination. 6. Cortical scarring seen in the right kidney. These results were called by telephone at the time of interpretation on 04/03/2020 at 10:27 pm to provider ADAM CURATOLO , who verbally acknowledged these results. Electronically Signed   By: Lovena Le M.D.   On: 04/03/2020 22:48   CT Cervical Spine Wo Contrast  Result Date: 04/03/2020 CLINICAL DATA:  Level 1 trauma: Fell 20 feet from tree. EXAM: CT HEAD WITHOUT CONTRAST CT CERVICAL SPINE WITHOUT CONTRAST CT CHEST, ABDOMEN AND PELVIS WITH CONTRAST TECHNIQUE: Contiguous axial images were obtained from the base of the skull through the vertex without intravenous contrast. Multidetector CT imaging of the cervical spine was performed without intravenous contrast. Multiplanar CT image reconstructions were also generated. Multidetector CT imaging of the chest, abdomen and pelvis was performed following the standard protocol during bolus administration of intravenous contrast. CONTRAST:  100 cc Omnipaque 350 IV COMPARISON:  Portable chest and pelvic radiograph same day FINDINGS: CT HEAD FINDINGS Brain: Likely remote lacune in the left external capsule/insula. No evidence of acute infarction, hemorrhage, hydrocephalus, extra-axial collection or mass lesion/mass effect. Symmetric prominence of the ventricles, cisterns and sulci compatible with parenchymal volume loss. Patchy areas of white matter hypoattenuation are most compatible with chronic microvascular angiopathy. Vascular: Atherosclerotic calcification of the carotid siphons. No hyperdense vessel. Skull: No focal scalp swelling or hematoma. No calvarial fracture or suspicious osseous lesion. Punctate dermal hyperdensities in the right frontal and left frontal scalp may reflect debris versus benign dermal calcification. Sinuses/Orbits: Minimal pneumatized secretions in  the right maxillary sinus. Remaining paranasal sinuses mastoid air cells are predominantly clear. Middle ear cavities are clear. Ossicular chains appear normally configured. Included orbital structures are unremarkable. Other: None CT CERVICAL SPINE FINDINGS Alignment: Preservation of the normal cervical lordosis. Minimal retrolisthesis C3 on C4 and C5 on C6 are likely on a degenerative basis given spondylitic and facet degenerative changes at these levels. No evidence of traumatic listhesis. No abnormally widened, perched or jumped facets. Asymmetric left facet arthropathy is noted C4-C6.  Normal alignment of the craniocervical and atlantoaxial articulations. Skull base and vertebrae: No acute fracture of the skull base or cervical vertebral bodies. Anterior wedging compression deformity of the T1 vertebral level (AO spine A1). Compression deformity of T2 is better detailed on chest CT and dedicated thoracic recon Speer posterior elements are intact. No worrisome osseous lesions. Soft tissues and spinal canal: No pre or paravertebral fluid or swelling. No visible canal hematoma. Disc levels: Multilevel intervertebral disc height loss with spondylitic endplate changes. Disc osteophyte complexes are present throughout the cervical spine with at most mild canal narrowing at the C5-6 and C6-7 levels secondary to posterior disc osteophyte complexes. CT CHEST FINDINGS Cardiovascular: The aortic root is suboptimally assessed given cardiac pulsation artifact. Calcifications upon the aortic leaflets. Atherosclerotic plaque within the normal caliber aorta. No intramural hematoma, dissection flap or other acute luminal abnormality of the aorta is seen. No periaortic stranding or hemorrhage. Shared origin of the brachiocephalic and left common carotid artery. Tortuosity of the brachiocephalic vasculature with minimal plaque in the proximal great vessels which are otherwise normally opacified. Central pulmonary arteries are  normal caliber no large central filling defects though poorly assessed given suboptimal contrast timing for evaluation of the pulmonary arteries. Normal heart size. No pericardial effusion. Coronary artery atherosclerosis is present. Mediastinum/Nodes: No mediastinal fluid or gas. Normal thyroid gland and thoracic inlet. No acute abnormality of the trachea or esophagus. No worrisome mediastinal, hilar or axillary adenopathy. Lungs/Pleura: There is a trace anterior apical right pneumothorax (3/10). Small right pleural effusion posteriorly. No left pneumothorax or effusion. Some ground-glass in the adjacent posterior bilateral lower lobes, right greater than left, may reflect combination of atelectasis and contusive changes with multiple rib fractures detailed below. No other acute traumatic abnormality of the lung parenchyma. Musculoskeletal: Anterior wedging compression deformity at T1 (AO spine A1) with 40% height loss anteriorly. Pincer type deformity of T2 with 60% height loss (AO spine A2). Incomplete burst fracture T3 superior endplate with up to [id number] height loss (AO spine A3). 2 mm of retropulsion of the superior endplate fracture fragments. Atypical widening of the spinous processes of T2-3 and T3-4 some adjacent stranding and thickening, could suggest some underlying disruption of the posterior tension band. Fracture of the right transverse process T3 (AO spine A0). Multiple contiguous posterior 4th through 11th right rib fractures including additional lateral segmental fractures of the fourth through eighth ribs. Contiguous posterior fractures of the left 3rd to 12th ribs with more lateral segmental components of the ninth through eleventh ribs. There is a highly comminuted and displaced fracture of the left scapula extending predominantly through the inferior scapular body extending to the base of the scapular spine and glenoid. There is associated intramuscular contusion in likely hemorrhage within the  infraspinatus and subscapularis muscle bellies. CT ABDOMEN PELVIS FINDINGS Hepatobiliary: No direct hepatic injury or perihepatic hematoma. No focal liver abnormality is seen. No gallstones, gallbladder wall thickening, or biliary dilatation. Pancreas: No direct pancreatic contusive change. Unremarkable. No pancreatic ductal dilatation or surrounding inflammatory changes. Spleen: No direct splenic injury or perisplenic hematoma. Normal splenic size. No concerning splenic lesion. Adrenals/Urinary Tract: Normal adrenal glands, no evidence of adrenal hematoma or hemorrhage. No direct renal lesion or perinephric hemorrhage. Areas of cortical scarring throughout the right kidney are present. No suspicious renal lesion, urolithiasis or hydronephrosis. No Urinary bladder is largely decompressed at the time of exam and therefore poorly evaluated by CT imaging. No evidence of direct bladder injury. Stomach/Bowel: Motion artifact may limit detection  of subtle bowel and mesenteric abnormalities. Distal esophagus, stomach and duodenal sweep are unremarkable. No small bowel wall thickening or dilatation. No evidence of obstruction. Fecalized contents of the distal small bowel may reflect some slowed intestinal transit. The appendix is not well visualized. No colonic dilatation or wall thickening. No direct mesenteric contusion or hemorrhage is evident. Vascular/Lymphatic: No acute aortic injury or dissection is seen. There is a 3.4 cm infrarenal abdominal aortic aneurysm without periaortic stranding or hemorrhage. Atherosclerotic calcifications noted throughout the abdominal aorta and branch vessels. No suspicious or enlarged lymph nodes in the included lymphatic chains. Reproductive: The prostate and seminal vesicles are unremarkable. Other: There is a small volume of intermediate attenuation (27 HU) fluid in the deep pelvis without a clear source of bleeding or hemorrhage. Musculoskeletal: Dedicated lumbar reconstructions were  generated and dictated separately. In brief there is a likely fracture of the left L1 and L2 transverse processes without vertebral body fracture or height loss. The bones of pelvis appear intact and congruent without acute osseous injury. IMPRESSION: Traumatic Findings: 1. No acute intracranial abnormality. 2. No acute cervical fracture or traumatic listhesis. 3. Anterior wedging compression deformity of the T1 vertebral level (AO spine A1) with up to 45% height loss anteriorly. 4. Pincer type deformity of T2 with up to 60% height loss (AO spine A2). 5. Incomplete burst fracture superior endplate T3 (AO spine A3). Up to 2 mm of retropulsion and at most mild canal stenosis. 6. Fracture of the right transverse process T3 (AO spine A0). 7. Widening of the spinous processes of T2-3 and T3-4. Cannot exclude disruption of the posterior tension band. Consider MRI for further assessment. 8. Comminuted and displaced fracture of the left scapula involving the inferior scapular body extending to base of the scapular spine and glenoid. Associated intramuscular contusion/hemorrhage within the infraspinatus and subscapularis. 9. Contiguous fourth through eleventh right rib fractures including segmental fractures the fourth through eighth ribs. Contiguous third through eleventh left rib fractures including segmental fractures of the left ninth through eleventh ribs. Given the extent of the segmental rib fractures assessment for flail chest morphology is recommended. 10. Trace anterior apical right pneumothorax.  Small right effusion. 11. Dependent ground-glass in both lungs may reflect a combination of atelectasis and or minimal pulmonary contusive change. 12. Fractures of the L1 and L2 left transverse processes. 13. Small volume of intermediate attenuation fluid in the abdomen and pelvis without clear source such as direct bowel injury or mesenteric hematoma or contusion though motion artifact may limit detection of subtle bowel  and mesenteric anomaly. Recommend close clinical monitoring and serial abdominal examinations. Nontraumatic Findings: 1. 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 2. Multilevel cervical spondylitic changes as well as additional multilevel degenerative changes in the thoracic and lumbar spine. 3. Aortic Atherosclerosis (ICD10-I70.0). 4. 3.4 cm infrarenal abdominal aortic aneurysm. Recommend followup by abdomen and pelvis CTA in 6 months, and vascular surgery referral/consultation if not already obtained. This recommendation follows ACR consensus guidelines: White Paper of the ACR Incidental Findings Committee II on Vascular Findings. J Am Coll Radiol 2013; 10:789-794. Aortic aneurysm NOS (ICD10-I71.9) 5. Coronary artery calcifications are present. Please note that the presence of coronary artery calcium documents the presence of coronary artery disease, the severity of this disease and any potential stenosis cannot be assessed on this non-gated CT examination. 6. Cortical scarring seen in the right kidney. These results were called by telephone at the time of interpretation on 04/03/2020 at 10:27 pm  to provider ADAM CURATOLO , who verbally acknowledged these results. Electronically Signed   By: Lovena Le M.D.   On: 04/03/2020 22:48   CT ABDOMEN PELVIS W CONTRAST  Result Date: 04/03/2020 CLINICAL DATA:  Level 1 trauma: Fell 20 feet from tree. EXAM: CT HEAD WITHOUT CONTRAST CT CERVICAL SPINE WITHOUT CONTRAST CT CHEST, ABDOMEN AND PELVIS WITH CONTRAST TECHNIQUE: Contiguous axial images were obtained from the base of the skull through the vertex without intravenous contrast. Multidetector CT imaging of the cervical spine was performed without intravenous contrast. Multiplanar CT image reconstructions were also generated. Multidetector CT imaging of the chest, abdomen and pelvis was performed following the standard protocol during bolus administration of intravenous  contrast. CONTRAST:  100 cc Omnipaque 350 IV COMPARISON:  Portable chest and pelvic radiograph same day FINDINGS: CT HEAD FINDINGS Brain: Likely remote lacune in the left external capsule/insula. No evidence of acute infarction, hemorrhage, hydrocephalus, extra-axial collection or mass lesion/mass effect. Symmetric prominence of the ventricles, cisterns and sulci compatible with parenchymal volume loss. Patchy areas of white matter hypoattenuation are most compatible with chronic microvascular angiopathy. Vascular: Atherosclerotic calcification of the carotid siphons. No hyperdense vessel. Skull: No focal scalp swelling or hematoma. No calvarial fracture or suspicious osseous lesion. Punctate dermal hyperdensities in the right frontal and left frontal scalp may reflect debris versus benign dermal calcification. Sinuses/Orbits: Minimal pneumatized secretions in the right maxillary sinus. Remaining paranasal sinuses mastoid air cells are predominantly clear. Middle ear cavities are clear. Ossicular chains appear normally configured. Included orbital structures are unremarkable. Other: None CT CERVICAL SPINE FINDINGS Alignment: Preservation of the normal cervical lordosis. Minimal retrolisthesis C3 on C4 and C5 on C6 are likely on a degenerative basis given spondylitic and facet degenerative changes at these levels. No evidence of traumatic listhesis. No abnormally widened, perched or jumped facets. Asymmetric left facet arthropathy is noted C4-C6. Normal alignment of the craniocervical and atlantoaxial articulations. Skull base and vertebrae: No acute fracture of the skull base or cervical vertebral bodies. Anterior wedging compression deformity of the T1 vertebral level (AO spine A1). Compression deformity of T2 is better detailed on chest CT and dedicated thoracic recon Speer posterior elements are intact. No worrisome osseous lesions. Soft tissues and spinal canal: No pre or paravertebral fluid or swelling. No  visible canal hematoma. Disc levels: Multilevel intervertebral disc height loss with spondylitic endplate changes. Disc osteophyte complexes are present throughout the cervical spine with at most mild canal narrowing at the C5-6 and C6-7 levels secondary to posterior disc osteophyte complexes. CT CHEST FINDINGS Cardiovascular: The aortic root is suboptimally assessed given cardiac pulsation artifact. Calcifications upon the aortic leaflets. Atherosclerotic plaque within the normal caliber aorta. No intramural hematoma, dissection flap or other acute luminal abnormality of the aorta is seen. No periaortic stranding or hemorrhage. Shared origin of the brachiocephalic and left common carotid artery. Tortuosity of the brachiocephalic vasculature with minimal plaque in the proximal great vessels which are otherwise normally opacified. Central pulmonary arteries are normal caliber no large central filling defects though poorly assessed given suboptimal contrast timing for evaluation of the pulmonary arteries. Normal heart size. No pericardial effusion. Coronary artery atherosclerosis is present. Mediastinum/Nodes: No mediastinal fluid or gas. Normal thyroid gland and thoracic inlet. No acute abnormality of the trachea or esophagus. No worrisome mediastinal, hilar or axillary adenopathy. Lungs/Pleura: There is a trace anterior apical right pneumothorax (3/10). Small right pleural effusion posteriorly. No left pneumothorax or effusion. Some ground-glass in the adjacent posterior bilateral lower lobes, right  greater than left, may reflect combination of atelectasis and contusive changes with multiple rib fractures detailed below. No other acute traumatic abnormality of the lung parenchyma. Musculoskeletal: Anterior wedging compression deformity at T1 (AO spine A1) with 40% height loss anteriorly. Pincer type deformity of T2 with 60% height loss (AO spine A2). Incomplete burst fracture T3 superior endplate with up to [id number]  height loss (AO spine A3). 2 mm of retropulsion of the superior endplate fracture fragments. Atypical widening of the spinous processes of T2-3 and T3-4 some adjacent stranding and thickening, could suggest some underlying disruption of the posterior tension band. Fracture of the right transverse process T3 (AO spine A0). Multiple contiguous posterior 4th through 11th right rib fractures including additional lateral segmental fractures of the fourth through eighth ribs. Contiguous posterior fractures of the left 3rd to 12th ribs with more lateral segmental components of the ninth through eleventh ribs. There is a highly comminuted and displaced fracture of the left scapula extending predominantly through the inferior scapular body extending to the base of the scapular spine and glenoid. There is associated intramuscular contusion in likely hemorrhage within the infraspinatus and subscapularis muscle bellies. CT ABDOMEN PELVIS FINDINGS Hepatobiliary: No direct hepatic injury or perihepatic hematoma. No focal liver abnormality is seen. No gallstones, gallbladder wall thickening, or biliary dilatation. Pancreas: No direct pancreatic contusive change. Unremarkable. No pancreatic ductal dilatation or surrounding inflammatory changes. Spleen: No direct splenic injury or perisplenic hematoma. Normal splenic size. No concerning splenic lesion. Adrenals/Urinary Tract: Normal adrenal glands, no evidence of adrenal hematoma or hemorrhage. No direct renal lesion or perinephric hemorrhage. Areas of cortical scarring throughout the right kidney are present. No suspicious renal lesion, urolithiasis or hydronephrosis. No Urinary bladder is largely decompressed at the time of exam and therefore poorly evaluated by CT imaging. No evidence of direct bladder injury. Stomach/Bowel: Motion artifact may limit detection of subtle bowel and mesenteric abnormalities. Distal esophagus, stomach and duodenal sweep are unremarkable. No small  bowel wall thickening or dilatation. No evidence of obstruction. Fecalized contents of the distal small bowel may reflect some slowed intestinal transit. The appendix is not well visualized. No colonic dilatation or wall thickening. No direct mesenteric contusion or hemorrhage is evident. Vascular/Lymphatic: No acute aortic injury or dissection is seen. There is a 3.4 cm infrarenal abdominal aortic aneurysm without periaortic stranding or hemorrhage. Atherosclerotic calcifications noted throughout the abdominal aorta and branch vessels. No suspicious or enlarged lymph nodes in the included lymphatic chains. Reproductive: The prostate and seminal vesicles are unremarkable. Other: There is a small volume of intermediate attenuation (27 HU) fluid in the deep pelvis without a clear source of bleeding or hemorrhage. Musculoskeletal: Dedicated lumbar reconstructions were generated and dictated separately. In brief there is a likely fracture of the left L1 and L2 transverse processes without vertebral body fracture or height loss. The bones of pelvis appear intact and congruent without acute osseous injury. IMPRESSION: Traumatic Findings: 1. No acute intracranial abnormality. 2. No acute cervical fracture or traumatic listhesis. 3. Anterior wedging compression deformity of the T1 vertebral level (AO spine A1) with up to 45% height loss anteriorly. 4. Pincer type deformity of T2 with up to 60% height loss (AO spine A2). 5. Incomplete burst fracture superior endplate T3 (AO spine A3). Up to 2 mm of retropulsion and at most mild canal stenosis. 6. Fracture of the right transverse process T3 (AO spine A0). 7. Widening of the spinous processes of T2-3 and T3-4. Cannot exclude disruption of the posterior tension  band. Consider MRI for further assessment. 8. Comminuted and displaced fracture of the left scapula involving the inferior scapular body extending to base of the scapular spine and glenoid. Associated intramuscular  contusion/hemorrhage within the infraspinatus and subscapularis. 9. Contiguous fourth through eleventh right rib fractures including segmental fractures the fourth through eighth ribs. Contiguous third through eleventh left rib fractures including segmental fractures of the left ninth through eleventh ribs. Given the extent of the segmental rib fractures assessment for flail chest morphology is recommended. 10. Trace anterior apical right pneumothorax.  Small right effusion. 11. Dependent ground-glass in both lungs may reflect a combination of atelectasis and or minimal pulmonary contusive change. 12. Fractures of the L1 and L2 left transverse processes. 13. Small volume of intermediate attenuation fluid in the abdomen and pelvis without clear source such as direct bowel injury or mesenteric hematoma or contusion though motion artifact may limit detection of subtle bowel and mesenteric anomaly. Recommend close clinical monitoring and serial abdominal examinations. Nontraumatic Findings: 1. 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 2. Multilevel cervical spondylitic changes as well as additional multilevel degenerative changes in the thoracic and lumbar spine. 3. Aortic Atherosclerosis (ICD10-I70.0). 4. 3.4 cm infrarenal abdominal aortic aneurysm. Recommend followup by abdomen and pelvis CTA in 6 months, and vascular surgery referral/consultation if not already obtained. This recommendation follows ACR consensus guidelines: White Paper of the ACR Incidental Findings Committee II on Vascular Findings. J Am Coll Radiol 2013; 10:789-794. Aortic aneurysm NOS (ICD10-I71.9) 5. Coronary artery calcifications are present. Please note that the presence of coronary artery calcium documents the presence of coronary artery disease, the severity of this disease and any potential stenosis cannot be assessed on this non-gated CT examination. 6. Cortical scarring seen in the right kidney.  These results were called by telephone at the time of interpretation on 04/03/2020 at 10:27 pm to provider ADAM CURATOLO , who verbally acknowledged these results. Electronically Signed   By: Lovena Le M.D.   On: 04/03/2020 22:48   DG Pelvis Portable  Result Date: 04/03/2020 CLINICAL DATA:  [place] out of tree, pain EXAM: PORTABLE PELVIS 1-2 VIEWS COMPARISON:  None. FINDINGS: Supine frontal view of the pelvis including both hips demonstrates no displaced fractures. Alignment is anatomic. Soft tissues are unremarkable. IMPRESSION: 1. No acute pelvic fracture. Electronically Signed   By: Randa Ngo M.D.   On: 04/03/2020 21:52   DG Chest Port 1 View  Result Date: 04/03/2020 CLINICAL DATA:  [place] 20 feet, short of breath, left-sided chest pain EXAM: PORTABLE CHEST 1 VIEW COMPARISON:  None. FINDINGS: Single frontal view of the chest demonstrates an unremarkable cardiac silhouette. No airspace disease, effusion, or pneumothorax on this supine evaluation. There is a minimally displaced right lateral seventh rib fracture. There is lucency through the infraspinous region of the scapula which may reflect fracture. IMPRESSION: 1. Suspected left scapular fracture. 2. Minimally displaced right lateral seventh rib fracture. 3. Otherwise no acute intrathoracic process. Electronically Signed   By: Randa Ngo M.D.   On: 04/03/2020 21:50     A/P: 64 year old man status post 20 foot fall from a tree nearly 10 hours ago with worsening pain and shortness of breath.  T1 wedge fx, T2 pincer type deformity, T3 superior endplate incomplete burst fx with 51mm retropulsion and mild canal stenosis, R T3 TP fx; L1-2 L TP fx, Possible posterior tension band disruption at T2-4: Neurosurgery consulted, Dr. Christella Noa at bedside. Recommends continuing C-Collar, no surgical intervention indicated  Left  scapula fx: orthopedic surgery consulted (Dr. Lucia Gaskins)  R 4-11 rib fx, segmental 4-8; L 3-11 rib fx, segmental 9-11, trace R apical  pneumothorax, small R effusion, atelectasis/ pulm contusion: pulls less than 500 on IS. 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

## 2020-04-03 NOTE — ED Triage Notes (Signed)
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 Pt then became SOB, c/o L sided CP, crepitus to L shoulder. Pt was originally walking around upon EMS arrival but dizzy became very lethargic en route

## 2020-04-03 NOTE — Progress Notes (Signed)
TRN note: Assisted ED staff with transport to CT and medication.   [place] [phone]

## 2020-04-04 ENCOUNTER — Inpatient Hospital Stay (HOSPITAL_COMMUNITY): Payer: Medicare Other

## 2020-04-04 LAB — CBC
HCT: 43.7 % (ref 39.0–52.0)
Hemoglobin: 14.7 g/dL (ref 13.0–17.0)
MCH: 30.2 pg (ref 26.0–34.0)
MCHC: 33.6 g/dL (ref 30.0–36.0)
MCV: 89.7 fL (ref 80.0–100.0)
Platelets: 151 10*3/uL (ref 150–400)
RBC: 4.87 MIL/uL (ref 4.22–5.81)
RDW: 13.4 % (ref 11.5–15.5)
WBC: 6.7 10*3/uL (ref 4.0–10.5)
nRBC: 0 % (ref 0.0–0.2)

## 2020-04-04 LAB — HIV ANTIBODY (ROUTINE TESTING W REFLEX): HIV Screen 4th Generation wRfx: NONREACTIVE

## 2020-04-04 LAB — BASIC METABOLIC PANEL
Anion gap: 8 (ref 5–15)
BUN: 14 mg/dL (ref 8–23)
CO2: 22 mmol/L (ref 22–32)
Calcium: 8.2 mg/dL — ABNORMAL LOW (ref 8.9–10.3)
Chloride: 106 mmol/L (ref 98–111)
Creatinine, Ser: 1 mg/dL (ref 0.61–1.24)
GFR calc Af Amer: >60 mL/min (ref >60)
GFR calc non Af Amer: >60 mL/min (ref >60)
Glucose, Bld: 117 mg/dL — ABNORMAL HIGH (ref 70–99)
Potassium: 4.1 mmol/L (ref 3.5–5.1)
Sodium: 136 mmol/L (ref 135–145)

## 2020-04-04 LAB — MRSA PCR SCREENING: MRSA by PCR: NEGATIVE

## 2020-04-04 MED ORDER — ENOXAPARIN SODIUM 40 MG/0.4ML ~~LOC~~ SOLN
40.0000 mg | Freq: Every day | SUBCUTANEOUS | Status: DC
  Administered 2020-04-04 – 2020-04-06 (×3): 40 mg via SUBCUTANEOUS
  Filled 2020-04-04 (×3): qty 0.4

## 2020-04-04 NOTE — Consult Note (Signed)
Reason for Consult: Left comminuted scapular body fracture Referring Physician: [facility] emergency department  Jordan [name] is an 64 y.o. male.  HPI: Had a fall from height about 20 feet when a branch hit his ladder knocking out from under him.  He was able to work some afterwards but then developed significant pain and presented to the emergency department via EMS.  Orthopedics was consulted as there was a comminuted scapular body fracture found on the left.  He had spine fractures as well and neurosurgery was consulted for those.  He was admitted to the ICU with the trauma team.  On my evaluation patient complains of pain in left shoulder as well as in the chest and in the back.  He denies any lower extremity symptoms.  Pain is sharp in quality and worsened with breathing and movement.  He has been using a sling which is comfortable.  He denies any numbness or tingling in bilateral upper or lower extremities.  History reviewed. No pertinent past medical history.  History reviewed. No pertinent surgical history.  No family history on file.  Social History:  reports that he has never smoked. He has never used smokeless tobacco. He reports current drug use. Drug: Cocaine. He reports that he does not drink alcohol.  Allergies: No Known Allergies  Medications: I have reviewed the patient's current medications.  Results for orders placed or performed during the hospital encounter of 04/03/20 (from the past 48 hour(s))  Sample to Blood Bank     Status: None   Collection Time: 04/03/20  9:36 PM  Result Value Ref Range   Blood Bank Specimen SAMPLE AVAILABLE FOR TESTING    Sample Expiration      04/04/2020,2359 Performed at [facility], [place] [address] [place], [place] [address]   I-Stat Chem 8, ED     Status: Abnormal   Collection Time: 04/03/20  9:40 PM  Result Value Ref Range   Sodium 138 135 - 145 mmol/L   Potassium 4.1 3.5 - 5.1 mmol/L   Chloride 101 98 - 111 mmol/L   BUN 18 8 -  23 mg/dL   Creatinine, Ser 1.10 0.61 - 1.24 mg/dL   Glucose, Bld 134 (H) 70 - 99 mg/dL    Comment: Glucose reference range applies only to samples taken after fasting for at least 8 hours.   Calcium, Ion 1.03 (L) 1.15 - 1.40 mmol/L   TCO2 28 22 - 32 mmol/L   Hemoglobin 16.3 13.0 - 17.0 g/dL   HCT 48.0 39.0 - 52.0 %  SARS Coronavirus 2 by RT PCR (hospital order, performed in [facility] hospital lab) Nasopharyngeal Nasopharyngeal Swab     Status: None   Collection Time: 04/03/20  9:41 PM   Specimen: Nasopharyngeal Swab  Result Value Ref Range   SARS Coronavirus 2 NEGATIVE NEGATIVE    Comment: (NOTE) SARS-CoV-2 target nucleic acids are NOT DETECTED. The SARS-CoV-2 RNA is generally detectable in upper and lower respiratory specimens during the acute phase of infection. The lowest concentration of SARS-CoV-2 viral copies this assay can detect is 250 copies / mL. A negative result does not preclude SARS-CoV-2 infection and should not be used as the sole basis for treatment or other patient management decisions.  A negative result may occur with improper specimen collection / handling, submission of specimen other than nasopharyngeal swab, presence of viral mutation(s) within the areas targeted by this assay, and inadequate number of viral copies (<250 copies / mL). A negative result  must be combined with clinical observations, patient history, and epidemiological information. Fact Sheet for Patients:   StrictlyIdeas.no Fact Sheet for Healthcare Providers: BankingDealers.co.za This test is not yet approved or cleared  by the [place] FDA and has been authorized for detection and/or diagnosis of SARS-CoV-2 by FDA under an Emergency Use Authorization (EUA).  This EUA will remain in effect (meaning this test can be used) for the duration of the COVID-19 declaration under Section 564(b)(1) of the Act, 21 U.S.C. section 360bbb-3(b)(1), unless  the authorization is terminated or revoked sooner. Performed at [facility], [place] [address] [place], [place] [address]   Comprehensive metabolic panel     Status: Abnormal   Collection Time: 04/03/20  9:42 PM  Result Value Ref Range   Sodium 134 (L) 135 - 145 mmol/L   Potassium 4.2 3.5 - 5.1 mmol/L   Chloride 98 98 - 111 mmol/L   CO2 24 22 - 32 mmol/L   Glucose, Bld 149 (H) 70 - 99 mg/dL    Comment: Glucose reference range applies only to samples taken after fasting for at least 8 hours.   BUN 16 8 - 23 mg/dL   Creatinine, Ser 1.20 0.61 - 1.24 mg/dL   Calcium 8.4 (L) 8.9 - 10.3 mg/dL   Total Protein 6.6 6.5 - 8.1 g/dL   Albumin 3.6 3.5 - 5.0 g/dL   AST 91 (H) 15 - 41 U/L   ALT 71 (H) 0 - 44 U/L   Alkaline Phosphatase 64 38 - 126 U/L   Total Bilirubin 1.0 0.3 - 1.2 mg/dL   GFR calc non Af Amer >60 >60 mL/min   GFR calc Af Amer >60 >60 mL/min   Anion gap 12 5 - 15    Comment: Performed at [place] [address] [place] [address]  CBC     Status: None   Collection Time: 04/03/20  9:42 PM  Result Value Ref Range   WBC 7.9 4.0 - 10.5 K/uL   RBC 5.14 4.22 - 5.81 MIL/uL   Hemoglobin 15.5 13.0 - 17.0 g/dL   HCT 46.7 39.0 - 52.0 %   MCV 90.9 80.0 - 100.0 fL   MCH 30.2 26.0 - 34.0 pg   MCHC 33.2 30.0 - 36.0 g/dL   RDW 13.3 11.5 - 15.5 %   Platelets 167 150 - 400 K/uL   nRBC 0.0 0.0 - 0.2 %    Comment: Performed at [facility], [place] [address] [place], [place] [address]  Ethanol     Status: None   Collection Time: 04/03/20  9:42 PM  Result Value Ref Range   Alcohol, Ethyl (B) <10 <10 mg/dL    Comment: (NOTE) Lowest detectable limit for serum alcohol is 10 mg/dL. For medical purposes only. Performed at [facility], [place] [address] [place], [place] [address]   Lactic acid, plasma     Status: Abnormal   Collection Time: 04/03/20  9:42 PM  Result Value Ref Range   Lactic Acid, Venous 2.2 (HH) 0.5 - 1.9 mmol/L    Comment: CRITICAL  RESULT CALLED TO, READ BACK BY AND VERIFIED WITH: Billie Lade 04/03/20 2231 WAYK Performed at [place] [address] [place], [place] [address]   Protime-INR     Status: None   Collection Time: 04/03/20  9:42 PM  Result Value Ref Range   Prothrombin Time 12.9 11.4 - 15.2 seconds   INR 1.0 0.8 -  1.2    Comment: (NOTE) INR goal varies based on device and disease states. Performed at [facility], [place] [address], [place], [place] [address]   MRSA PCR Screening     Status: None   Collection Time: 04/03/20 11:33 PM   Specimen: Nasopharyngeal  Result Value Ref Range   MRSA by PCR NEGATIVE NEGATIVE    Comment:        The GeneXpert MRSA Assay (FDA approved for NASAL specimens only), is one component of a comprehensive MRSA colonization surveillance program. It is not intended to diagnose MRSA infection nor to guide or monitor treatment for MRSA infections. Performed at [facility], [place] [address], [place] [address]   CBC     Status: None   Collection Time: 04/04/20  4:56 AM  Result Value Ref Range   WBC 6.7 4.0 - 10.5 K/uL   RBC 4.87 4.22 - 5.81 MIL/uL   Hemoglobin 14.7 13.0 - 17.0 g/dL   HCT 43.7 39.0 - 52.0 %   MCV 89.7 80.0 - 100.0 fL   MCH 30.2 26.0 - 34.0 pg   MCHC 33.6 30.0 - 36.0 g/dL   RDW 13.4 11.5 - 15.5 %   Platelets 151 150 - 400 K/uL   nRBC 0.0 0.0 - 0.2 %    Comment: Performed at [facility], [place] [address] [place], [place] [id number]  Basic metabolic panel     Status: Abnormal   Collection Time: 04/04/20  4:56 AM  Result Value Ref Range   Sodium 136 135 - 145 mmol/L   Potassium 4.1 3.5 - 5.1 mmol/L   Chloride 106 98 - 111 mmol/L   CO2 22 22 - 32 mmol/L   Glucose, Bld 117 (H) 70 - 99 mg/dL    Comment: Glucose reference range applies only to samples taken after fasting for at least 8 hours.   BUN 14 8 - 23 mg/dL   Creatinine, Ser 1.00 0.61 - 1.24 mg/dL   Calcium 8.2 (L) 8.9 - 10.3 mg/dL   GFR calc non Af Amer  >60 >60 mL/min   GFR calc Af Amer >60 >60 mL/min   Anion gap 8 5 - 15    Comment: Performed at [facility] [address] [place], [place] [address]   Imaging: CT of the chest demonstrates left comminuted scapular body fracture that is extra-articular.  Please see radiology report for further imaging studies and interpretations.  Review of Systems  Constitutional: Negative.   HENT: Negative.   Eyes: Negative.   Respiratory:       Chest pain with breathing  Cardiovascular: Negative.   Gastrointestinal: Negative.   Musculoskeletal:       Back and left shoulder pain  Neurological: Negative.   Psychiatric/Behavioral: Negative.    Blood pressure (!) 163/87, pulse 60, temperature 97.8 F (36.6 C), temperature source Oral, resp. rate 13, height 6' (1.829 m), weight 75.8 kg, SpO2 100 %. Physical Exam  Constitutional: He appears well-developed.  HENT:  Head: Normocephalic.  Eyes: Conjunctivae are normal.  Neck:  Cervical collar  Respiratory: He exhibits tenderness.  GI: Soft.  Musculoskeletal:     Comments: Left shoulder with sling in place.  He has tenderness palpation about the scapula.  No tenderness laterally about the shoulder.  Did not assess range of motion of the shoulder.  No tenderness along the clavicle.  Some chest wall tenderness.  No tenderness about the arm or elbow, forearm or wrist  or hand.  Patient able to wiggle all digits without difficulty.  Endorses sensation the median, ulnar and radial nerve distributions.  Palpable radial pulse.  No evidence of lower extremity injury.  On palpation of bilateral thighs, knees, legs, ankles and feet no discomfort noted.  No evidence of right upper extremity injury.  He is able to use it well ranging the shoulder, elbow, wrist and hand.  Neurological: He is alert.  Skin: Skin is warm.  Psychiatric: He has a normal mood and affect.    Assessment/Plan: Left comminuted scapular body fracture that is extra-articular.  We  will plan to treat this fracture in a closed fashion without manipulation.  He will continue the sling for comfort for now.  He is okay for gentle range of motion through the left shoulder.  Okay for weightbearing through the left shoulder if needed.  I would like to see the patient back in the office for x-rays in approximately 2-3 weeks.    Erle Crocker 04/04/2020, 8:39 AM

## 2020-04-04 NOTE — Evaluation (Signed)
Physical Therapy Evaluation Patient Details Name: Jordan [name] MRN: [id number] DOB: [birth year] Today's Date: 04/04/2020   History of Present Illness  64yo male who sustained a 20' fall from tree at work. He reports shallow breathing since the incident, but developed worsening SOB, chest pain and shoulder pain. Reported pt was originally walking around but became dizzy and lethargic en route to hospital. Pt with hx of smoking crack cocaine. Pt found to have T1 wedge fx, T2 pincer type deformity, T3 superior endplate incomplete burst fx with 36mm retropulsion and mild canal stenosis, R T3 TP fx; L1-2 L TP fx. NSG recommends C-collar, no surgery.  Clinical Impression  Pt presents to PT with deficits in functional mobility, gait, balance, endurance, strength, power, safety awareness. Pt is impulsive at times during session and twice refuses PT/OT commands to sit despite pt reports of dizziness and diaphoresis. Pt requires assistance to mobilize in bed due to limited ability to scoot hips 2/2 pain when pushing through RUE. Pt limited in activity tolerance this session by dizziness but does stand and take steps to recliner with limited physical assistance. Pt will benefit from aggressive mobilization and PT POC to improve activity tolerance and gait quality. PT currently recommending pt discharge home with HHPT and use of a RW as PT anticipates the patient will progress quickly if mobilized often in the acute setting.    Follow Up Recommendations Home health PT;Supervision/Assistance - 24 hour    Equipment Recommendations  3in1 (PT)(per pt's mom pt owns RW)    Recommendations for Other Services       Precautions / Restrictions Precautions Precautions: Fall;Cervical Precaution Booklet Issued: No Precaution Comments: PT verbally reviews spinal precautions Required Braces or Orthoses: Cervical Brace Cervical Brace: Hard collar;At all times Restrictions Other Position/Activity Restrictions: pt  able to perform gentle ROM to L shoulder, sling ordered, no specific WB orders in chart      Mobility  Bed Mobility Overal bed mobility: Needs Assistance Bed Mobility: Supine to Sit     Supine to sit: Mod assist;HOB elevated     General bed mobility comments: HOB elevated to 70 degrees, pt requires assistance to turn hips to edge of bed  Transfers Overall transfer level: Needs assistance Equipment used: 1 person hand held assist Transfers: Sit to/from Stand Sit to Stand: Min assist            Ambulation/Gait Ambulation/Gait assistance: Min assist Gait Distance (Feet): 3 Feet Assistive device: 1 person hand held assist Gait Pattern/deviations: Step-to pattern Gait velocity: reduced Gait velocity interpretation: <1.8 ft/sec, indicate of risk for recurrent falls General Gait Details: pt with short step to gait to mobilize form bed to recliner  Stairs            Wheelchair Mobility    Modified Rankin (Stroke Patients Only)       Balance Overall balance assessment: Needs assistance Sitting-balance support: Single extremity supported;Feet supported Sitting balance-Leahy Scale: Fair Sitting balance - Comments: close supervision   Standing balance support: Single extremity supported Standing balance-Leahy Scale: Fair Standing balance comment: minG with RUE support of hand hold                             Pertinent Vitals/Pain Pain Assessment: Faces Faces Pain Scale: Hurts even more Pain Location: ribs with coughing Pain Descriptors / Indicators: Grimacing Pain Intervention(s): Monitored during session    Home Living Family/patient expects to be discharged to:: Private residence  Living Arrangements: Parent Available Help at Discharge: Family Type of Home: House Home Access: Stairs to enter Entrance Stairs-Rails: None Entrance Stairs-Number of Steps: 2 in back door, 1 into kitchen Home Layout: Two level Home Equipment: Cane - single  point;Walker - 2 wheels;Walker - 4 wheels;Shower seat      Prior Function Level of Independence: Independent         Comments: working in [place] Hand: Right    Extremity/Trunk Assessment   Upper Extremity Assessment Upper Extremity Assessment: Defer to OT evaluation    Lower Extremity Assessment Lower Extremity Assessment: Generalized weakness(grossly 4/5 BLE)    Cervical / Trunk Assessment Cervical / Trunk Assessment: Other exceptions Cervical / Trunk Exceptions: C-collar  Communication   Communication: No difficulties  Cognition Arousal/Alertness: Awake/alert Behavior During Therapy: WFL for tasks assessed/performed Overall Cognitive Status: Impaired/Different from baseline Area of Impairment: Safety/judgement;Awareness;Problem solving                         Safety/Judgement: Decreased awareness of safety;Decreased awareness of deficits Awareness: Emergent Problem Solving: Slow processing;Requires verbal cues        General Comments General comments (skin integrity, edema, etc.): VSS during session, pt on 4L [place]. Pt hypertensive with BPs of 183/97 with sitting and 172/103 with standing. Pt reports some dizziness with standing as well as cold sweat although no sweating noted. Pt impulsive and refuses to sit to PT request multiple times, eventually becomes agreeable and transfers to recliner.    Exercises     Assessment/Plan    PT Assessment Patient needs continued PT services  PT Problem List Decreased strength;Decreased activity tolerance;Decreased balance;Decreased mobility;Decreased cognition;Decreased knowledge of use of DME;Decreased safety awareness;Decreased knowledge of precautions;Cardiopulmonary status limiting activity;Pain       PT Treatment Interventions DME instruction;Gait training;Stair training;Functional mobility training;Therapeutic activities;Therapeutic exercise;Balance training;Neuromuscular  re-education;Cognitive remediation;Patient/family education    PT Goals (Current goals can be found in the Care Plan section)  Acute Rehab PT Goals Patient Stated Goal: To improve mobility PT Goal Formulation: With patient Time For Goal Achievement: 04/18/20 Potential to Achieve Goals: Good    Frequency Min 5X/week   Barriers to discharge        Co-evaluation               AM-PAC PT "6 Clicks" Mobility  Outcome Measure Help needed turning from your back to your side while in a flat bed without using bedrails?: A Little Help needed moving from lying on your back to sitting on the side of a flat bed without using bedrails?: A Lot Help needed moving to and from a bed to a chair (including a wheelchair)?: A Little Help needed standing up from a chair using your arms (e.g., wheelchair or bedside chair)?: A Little Help needed to walk in hospital room?: A Little Help needed climbing 3-5 steps with a railing? : A Lot 6 Click Score: 16    End of Session Equipment Utilized During Treatment: Cervical collar Activity Tolerance: Patient tolerated treatment well Patient left: in chair;with call bell/phone within reach;with chair alarm set;with family/visitor present Nurse Communication: Mobility status PT Visit Diagnosis: Other abnormalities of gait and mobility (R26.89);Muscle weakness (generalized) (M62.81);Pain Pain - Right/Left: (ribs bilaterally)    Time: [id number] PT Time Calculation (min) (ACUTE ONLY): 34 min   Charges:   PT Evaluation $PT Eval Moderate Complexity: 1 Mod  Zenaida Niece, PT, DPT Acute Rehabilitation Pager: [phone]   Zenaida Niece 04/04/2020, 11:06 AM

## 2020-04-04 NOTE — Progress Notes (Signed)
Subjective/Chief Complaint: Complains of left rib pain   Objective: Vital signs in last 24 hours: Temp:  [97.6 F (36.4 C)-98.8 F (37.1 C)] 97.8 F (36.6 C) (05/22 0800) Pulse Rate:  [58-81] 60 (05/22 0700) Resp:  [12-29] 13 (05/22 0700) BP: (137-190)/(85-138) 163/87 (05/22 0700) SpO2:  [98 %-100 %] 100 % (05/22 0700) Weight:  [75.8 kg-81.6 kg] 75.8 kg (05/21 2332)    Intake/Output from previous day: 05/21 0701 - 05/22 0700 In: 780.7 [I.V.:780.7] Out: 0  Intake/Output this shift: No intake/output data recorded.  General appearance: alert and cooperative Resp: rhonchi anterior - left Cardio: regular rate and rhythm GI: soft but distended and quiet  Lab Results:  Recent Labs    04/03/20 2142 04/04/20 0456  WBC 7.9 6.7  HGB 15.5 14.7  HCT 46.7 43.7  PLT 167 151   BMET Recent Labs    04/03/20 2142 04/04/20 0456  NA 134* 136  K 4.2 4.1  CL 98 106  CO2 24 22  GLUCOSE 149* 117*  BUN 16 14  CREATININE 1.20 1.00  CALCIUM 8.4* 8.2*   PT/INR Recent Labs    04/03/20 2142  LABPROT 12.9  INR 1.0   ABG No results for input(s): PHART, HCO3 in the last 72 hours.  Invalid input(s): PCO2, PO2  Studies/Results: CT HEAD WO CONTRAST  Addendum Date: 04/03/2020   ADDENDUM REPORT: 04/03/2020 23:05 ADDENDUM: Error identified in the physician notification attestation below -- should read: These results were called by telephone at the time of interpretation on 04/03/2020 at 10:27 pm to provider Dr. Kae Heller of surgery, who verbally acknowledged these results. Electronically Signed   By: Lovena Le M.D.   On: 04/03/2020 23:05   Result Date: 04/03/2020 CLINICAL DATA:  Level 1 trauma: Fell 20 feet from tree. EXAM: CT HEAD WITHOUT CONTRAST CT CERVICAL SPINE WITHOUT CONTRAST CT CHEST, ABDOMEN AND PELVIS WITH CONTRAST TECHNIQUE: Contiguous axial images were obtained from the base of the skull through the vertex without intravenous contrast. Multidetector CT imaging of the  cervical spine was performed without intravenous contrast. Multiplanar CT image reconstructions were also generated. Multidetector CT imaging of the chest, abdomen and pelvis was performed following the standard protocol during bolus administration of intravenous contrast. CONTRAST:  100 cc Omnipaque 350 IV COMPARISON:  Portable chest and pelvic radiograph same day FINDINGS: CT HEAD FINDINGS Brain: Likely remote lacune in the left external capsule/insula. No evidence of acute infarction, hemorrhage, hydrocephalus, extra-axial collection or mass lesion/mass effect. Symmetric prominence of the ventricles, cisterns and sulci compatible with parenchymal volume loss. Patchy areas of white matter hypoattenuation are most compatible with chronic microvascular angiopathy. Vascular: Atherosclerotic calcification of the carotid siphons. No hyperdense vessel. Skull: No focal scalp swelling or hematoma. No calvarial fracture or suspicious osseous lesion. Punctate dermal hyperdensities in the right frontal and left frontal scalp may reflect debris versus benign dermal calcification. Sinuses/Orbits: Minimal pneumatized secretions in the right maxillary sinus. Remaining paranasal sinuses mastoid air cells are predominantly clear. Middle ear cavities are clear. Ossicular chains appear normally configured. Included orbital structures are unremarkable. Other: None CT CERVICAL SPINE FINDINGS Alignment: Preservation of the normal cervical lordosis. Minimal retrolisthesis C3 on C4 and C5 on C6 are likely on a degenerative basis given spondylitic and facet degenerative changes at these levels. No evidence of traumatic listhesis. No abnormally widened, perched or jumped facets. Asymmetric left facet arthropathy is noted C4-C6. Normal alignment of the craniocervical and atlantoaxial articulations. Skull base and vertebrae: No acute fracture of the  skull base or cervical vertebral bodies. Anterior wedging compression deformity of the T1  vertebral level (AO spine A1). Compression deformity of T2 is better detailed on chest CT and dedicated thoracic recon Speer posterior elements are intact. No worrisome osseous lesions. Soft tissues and spinal canal: No pre or paravertebral fluid or swelling. No visible canal hematoma. Disc levels: Multilevel intervertebral disc height loss with spondylitic endplate changes. Disc osteophyte complexes are present throughout the cervical spine with at most mild canal narrowing at the C5-6 and C6-7 levels secondary to posterior disc osteophyte complexes. CT CHEST FINDINGS Cardiovascular: The aortic root is suboptimally assessed given cardiac pulsation artifact. Calcifications upon the aortic leaflets. Atherosclerotic plaque within the normal caliber aorta. No intramural hematoma, dissection flap or other acute luminal abnormality of the aorta is seen. No periaortic stranding or hemorrhage. Shared origin of the brachiocephalic and left common carotid artery. Tortuosity of the brachiocephalic vasculature with minimal plaque in the proximal great vessels which are otherwise normally opacified. Central pulmonary arteries are normal caliber no large central filling defects though poorly assessed given suboptimal contrast timing for evaluation of the pulmonary arteries. Normal heart size. No pericardial effusion. Coronary artery atherosclerosis is present. Mediastinum/Nodes: No mediastinal fluid or gas. Normal thyroid gland and thoracic inlet. No acute abnormality of the trachea or esophagus. No worrisome mediastinal, hilar or axillary adenopathy. Lungs/Pleura: There is a trace anterior apical right pneumothorax (3/10). Small right pleural effusion posteriorly. No left pneumothorax or effusion. Some ground-glass in the adjacent posterior bilateral lower lobes, right greater than left, may reflect combination of atelectasis and contusive changes with multiple rib fractures detailed below. No other acute traumatic abnormality  of the lung parenchyma. Musculoskeletal: Anterior wedging compression deformity at T1 (AO spine A1) with 40% height loss anteriorly. Pincer type deformity of T2 with 60% height loss (AO spine A2). Incomplete burst fracture T3 superior endplate with up to D34-534 height loss (AO spine A3). 2 mm of retropulsion of the superior endplate fracture fragments. Atypical widening of the spinous processes of T2-3 and T3-4 some adjacent stranding and thickening, could suggest some underlying disruption of the posterior tension band. Fracture of the right transverse process T3 (AO spine A0). Multiple contiguous posterior 4th through 11th right rib fractures including additional lateral segmental fractures of the fourth through eighth ribs. Contiguous posterior fractures of the left 3rd to 12th ribs with more lateral segmental components of the ninth through eleventh ribs. There is a highly comminuted and displaced fracture of the left scapula extending predominantly through the inferior scapular body extending to the base of the scapular spine and glenoid. There is associated intramuscular contusion in likely hemorrhage within the infraspinatus and subscapularis muscle bellies. CT ABDOMEN PELVIS FINDINGS Hepatobiliary: No direct hepatic injury or perihepatic hematoma. No focal liver abnormality is seen. No gallstones, gallbladder wall thickening, or biliary dilatation. Pancreas: No direct pancreatic contusive change. Unremarkable. No pancreatic ductal dilatation or surrounding inflammatory changes. Spleen: No direct splenic injury or perisplenic hematoma. Normal splenic size. No concerning splenic lesion. Adrenals/Urinary Tract: Normal adrenal glands, no evidence of adrenal hematoma or hemorrhage. No direct renal lesion or perinephric hemorrhage. Areas of cortical scarring throughout the right kidney are present. No suspicious renal lesion, urolithiasis or hydronephrosis. No Urinary bladder is largely decompressed at the time of  exam and therefore poorly evaluated by CT imaging. No evidence of direct bladder injury. Stomach/Bowel: Motion artifact may limit detection of subtle bowel and mesenteric abnormalities. Distal esophagus, stomach and duodenal sweep are unremarkable. No small  bowel wall thickening or dilatation. No evidence of obstruction. Fecalized contents of the distal small bowel may reflect some slowed intestinal transit. The appendix is not well visualized. No colonic dilatation or wall thickening. No direct mesenteric contusion or hemorrhage is evident. Vascular/Lymphatic: No acute aortic injury or dissection is seen. There is a 3.4 cm infrarenal abdominal aortic aneurysm without periaortic stranding or hemorrhage. Atherosclerotic calcifications noted throughout the abdominal aorta and branch vessels. No suspicious or enlarged lymph nodes in the included lymphatic chains. Reproductive: The prostate and seminal vesicles are unremarkable. Other: There is a small volume of intermediate attenuation (27 HU) fluid in the deep pelvis without a clear source of bleeding or hemorrhage. Musculoskeletal: Dedicated lumbar reconstructions were generated and dictated separately. In brief there is a likely fracture of the left L1 and L2 transverse processes without vertebral body fracture or height loss. The bones of pelvis appear intact and congruent without acute osseous injury. IMPRESSION: Traumatic Findings: 1. No acute intracranial abnormality. 2. No acute cervical fracture or traumatic listhesis. 3. Anterior wedging compression deformity of the T1 vertebral level (AO spine A1) with up to 45% height loss anteriorly. 4. Pincer type deformity of T2 with up to 60% height loss (AO spine A2). 5. Incomplete burst fracture superior endplate T3 (AO spine A3). Up to 2 mm of retropulsion and at most mild canal stenosis. 6. Fracture of the right transverse process T3 (AO spine A0). 7. Widening of the spinous processes of T2-3 and T3-4. Cannot  exclude disruption of the posterior tension band. Consider MRI for further assessment. 8. Comminuted and displaced fracture of the left scapula involving the inferior scapular body extending to base of the scapular spine and glenoid. Associated intramuscular contusion/hemorrhage within the infraspinatus and subscapularis. 9. Contiguous fourth through eleventh right rib fractures including segmental fractures the fourth through eighth ribs. Contiguous third through eleventh left rib fractures including segmental fractures of the left ninth through eleventh ribs. Given the extent of the segmental rib fractures assessment for flail chest morphology is recommended. 10. Trace anterior apical right pneumothorax.  Small right effusion. 11. Dependent ground-glass in both lungs may reflect a combination of atelectasis and or minimal pulmonary contusive change. 12. Fractures of the L1 and L2 left transverse processes. 13. Small volume of intermediate attenuation fluid in the abdomen and pelvis without clear source such as direct bowel injury or mesenteric hematoma or contusion though motion artifact may limit detection of subtle bowel and mesenteric anomaly. Recommend close clinical monitoring and serial abdominal examinations. Nontraumatic Findings: 1. 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 2. Multilevel cervical spondylitic changes as well as additional multilevel degenerative changes in the thoracic and lumbar spine. 3. Aortic Atherosclerosis (ICD10-I70.0). 4. 3.4 cm infrarenal abdominal aortic aneurysm. Recommend followup by abdomen and pelvis CTA in 6 months, and vascular surgery referral/consultation if not already obtained. This recommendation follows ACR consensus guidelines: White Paper of the ACR Incidental Findings Committee II on Vascular Findings. J Am Coll Radiol 2013; 10:789-794. Aortic aneurysm NOS (ICD10-I71.9) 5. Coronary artery calcifications are present. Please  note that the presence of coronary artery calcium documents the presence of coronary artery disease, the severity of this disease and any potential stenosis cannot be assessed on this non-gated CT examination. 6. Cortical scarring seen in the right kidney. These results were called by telephone at the time of interpretation on 04/03/2020 at 10:27 pm to provider ADAM CURATOLO , who verbally acknowledged these results. Electronically Signed: By: Lovena Le  M.D. On: 04/03/2020 22:48   CT CHEST W CONTRAST  Addendum Date: 04/03/2020   ADDENDUM REPORT: 04/03/2020 23:05 ADDENDUM: Error identified in the physician notification attestation below -- should read: These results were called by telephone at the time of interpretation on 04/03/2020 at 10:27 pm to provider Dr. Kae Heller of surgery, who verbally acknowledged these results. Electronically Signed   By: Lovena Le M.D.   On: 04/03/2020 23:05   Result Date: 04/03/2020 CLINICAL DATA:  Level 1 trauma: Fell 20 feet from tree. EXAM: CT HEAD WITHOUT CONTRAST CT CERVICAL SPINE WITHOUT CONTRAST CT CHEST, ABDOMEN AND PELVIS WITH CONTRAST TECHNIQUE: Contiguous axial images were obtained from the base of the skull through the vertex without intravenous contrast. Multidetector CT imaging of the cervical spine was performed without intravenous contrast. Multiplanar CT image reconstructions were also generated. Multidetector CT imaging of the chest, abdomen and pelvis was performed following the standard protocol during bolus administration of intravenous contrast. CONTRAST:  100 cc Omnipaque 350 IV COMPARISON:  Portable chest and pelvic radiograph same day FINDINGS: CT HEAD FINDINGS Brain: Likely remote lacune in the left external capsule/insula. No evidence of acute infarction, hemorrhage, hydrocephalus, extra-axial collection or mass lesion/mass effect. Symmetric prominence of the ventricles, cisterns and sulci compatible with parenchymal volume loss. Patchy areas of white  matter hypoattenuation are most compatible with chronic microvascular angiopathy. Vascular: Atherosclerotic calcification of the carotid siphons. No hyperdense vessel. Skull: No focal scalp swelling or hematoma. No calvarial fracture or suspicious osseous lesion. Punctate dermal hyperdensities in the right frontal and left frontal scalp may reflect debris versus benign dermal calcification. Sinuses/Orbits: Minimal pneumatized secretions in the right maxillary sinus. Remaining paranasal sinuses mastoid air cells are predominantly clear. Middle ear cavities are clear. Ossicular chains appear normally configured. Included orbital structures are unremarkable. Other: None CT CERVICAL SPINE FINDINGS Alignment: Preservation of the normal cervical lordosis. Minimal retrolisthesis C3 on C4 and C5 on C6 are likely on a degenerative basis given spondylitic and facet degenerative changes at these levels. No evidence of traumatic listhesis. No abnormally widened, perched or jumped facets. Asymmetric left facet arthropathy is noted C4-C6. Normal alignment of the craniocervical and atlantoaxial articulations. Skull base and vertebrae: No acute fracture of the skull base or cervical vertebral bodies. Anterior wedging compression deformity of the T1 vertebral level (AO spine A1). Compression deformity of T2 is better detailed on chest CT and dedicated thoracic recon Speer posterior elements are intact. No worrisome osseous lesions. Soft tissues and spinal canal: No pre or paravertebral fluid or swelling. No visible canal hematoma. Disc levels: Multilevel intervertebral disc height loss with spondylitic endplate changes. Disc osteophyte complexes are present throughout the cervical spine with at most mild canal narrowing at the C5-6 and C6-7 levels secondary to posterior disc osteophyte complexes. CT CHEST FINDINGS Cardiovascular: The aortic root is suboptimally assessed given cardiac pulsation artifact. Calcifications upon the  aortic leaflets. Atherosclerotic plaque within the normal caliber aorta. No intramural hematoma, dissection flap or other acute luminal abnormality of the aorta is seen. No periaortic stranding or hemorrhage. Shared origin of the brachiocephalic and left common carotid artery. Tortuosity of the brachiocephalic vasculature with minimal plaque in the proximal great vessels which are otherwise normally opacified. Central pulmonary arteries are normal caliber no large central filling defects though poorly assessed given suboptimal contrast timing for evaluation of the pulmonary arteries. Normal heart size. No pericardial effusion. Coronary artery atherosclerosis is present. Mediastinum/Nodes: No mediastinal fluid or gas. Normal thyroid gland and thoracic inlet. No acute  abnormality of the trachea or esophagus. No worrisome mediastinal, hilar or axillary adenopathy. Lungs/Pleura: There is a trace anterior apical right pneumothorax (3/10). Small right pleural effusion posteriorly. No left pneumothorax or effusion. Some ground-glass in the adjacent posterior bilateral lower lobes, right greater than left, may reflect combination of atelectasis and contusive changes with multiple rib fractures detailed below. No other acute traumatic abnormality of the lung parenchyma. Musculoskeletal: Anterior wedging compression deformity at T1 (AO spine A1) with 40% height loss anteriorly. Pincer type deformity of T2 with 60% height loss (AO spine A2). Incomplete burst fracture T3 superior endplate with up to D34-534 height loss (AO spine A3). 2 mm of retropulsion of the superior endplate fracture fragments. Atypical widening of the spinous processes of T2-3 and T3-4 some adjacent stranding and thickening, could suggest some underlying disruption of the posterior tension band. Fracture of the right transverse process T3 (AO spine A0). Multiple contiguous posterior 4th through 11th right rib fractures including additional lateral segmental  fractures of the fourth through eighth ribs. Contiguous posterior fractures of the left 3rd to 12th ribs with more lateral segmental components of the ninth through eleventh ribs. There is a highly comminuted and displaced fracture of the left scapula extending predominantly through the inferior scapular body extending to the base of the scapular spine and glenoid. There is associated intramuscular contusion in likely hemorrhage within the infraspinatus and subscapularis muscle bellies. CT ABDOMEN PELVIS FINDINGS Hepatobiliary: No direct hepatic injury or perihepatic hematoma. No focal liver abnormality is seen. No gallstones, gallbladder wall thickening, or biliary dilatation. Pancreas: No direct pancreatic contusive change. Unremarkable. No pancreatic ductal dilatation or surrounding inflammatory changes. Spleen: No direct splenic injury or perisplenic hematoma. Normal splenic size. No concerning splenic lesion. Adrenals/Urinary Tract: Normal adrenal glands, no evidence of adrenal hematoma or hemorrhage. No direct renal lesion or perinephric hemorrhage. Areas of cortical scarring throughout the right kidney are present. No suspicious renal lesion, urolithiasis or hydronephrosis. No Urinary bladder is largely decompressed at the time of exam and therefore poorly evaluated by CT imaging. No evidence of direct bladder injury. Stomach/Bowel: Motion artifact may limit detection of subtle bowel and mesenteric abnormalities. Distal esophagus, stomach and duodenal sweep are unremarkable. No small bowel wall thickening or dilatation. No evidence of obstruction. Fecalized contents of the distal small bowel may reflect some slowed intestinal transit. The appendix is not well visualized. No colonic dilatation or wall thickening. No direct mesenteric contusion or hemorrhage is evident. Vascular/Lymphatic: No acute aortic injury or dissection is seen. There is a 3.4 cm infrarenal abdominal aortic aneurysm without periaortic  stranding or hemorrhage. Atherosclerotic calcifications noted throughout the abdominal aorta and branch vessels. No suspicious or enlarged lymph nodes in the included lymphatic chains. Reproductive: The prostate and seminal vesicles are unremarkable. Other: There is a small volume of intermediate attenuation (27 HU) fluid in the deep pelvis without a clear source of bleeding or hemorrhage. Musculoskeletal: Dedicated lumbar reconstructions were generated and dictated separately. In brief there is a likely fracture of the left L1 and L2 transverse processes without vertebral body fracture or height loss. The bones of pelvis appear intact and congruent without acute osseous injury. IMPRESSION: Traumatic Findings: 1. No acute intracranial abnormality. 2. No acute cervical fracture or traumatic listhesis. 3. Anterior wedging compression deformity of the T1 vertebral level (AO spine A1) with up to 45% height loss anteriorly. 4. Pincer type deformity of T2 with up to 60% height loss (AO spine A2). 5. Incomplete burst fracture superior endplate T3 (  AO spine A3). Up to 2 mm of retropulsion and at most mild canal stenosis. 6. Fracture of the right transverse process T3 (AO spine A0). 7. Widening of the spinous processes of T2-3 and T3-4. Cannot exclude disruption of the posterior tension band. Consider MRI for further assessment. 8. Comminuted and displaced fracture of the left scapula involving the inferior scapular body extending to base of the scapular spine and glenoid. Associated intramuscular contusion/hemorrhage within the infraspinatus and subscapularis. 9. Contiguous fourth through eleventh right rib fractures including segmental fractures the fourth through eighth ribs. Contiguous third through eleventh left rib fractures including segmental fractures of the left ninth through eleventh ribs. Given the extent of the segmental rib fractures assessment for flail chest morphology is recommended. 10. Trace anterior  apical right pneumothorax.  Small right effusion. 11. Dependent ground-glass in both lungs may reflect a combination of atelectasis and or minimal pulmonary contusive change. 12. Fractures of the L1 and L2 left transverse processes. 13. Small volume of intermediate attenuation fluid in the abdomen and pelvis without clear source such as direct bowel injury or mesenteric hematoma or contusion though motion artifact may limit detection of subtle bowel and mesenteric anomaly. Recommend close clinical monitoring and serial abdominal examinations. Nontraumatic Findings: 1. 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 2. Multilevel cervical spondylitic changes as well as additional multilevel degenerative changes in the thoracic and lumbar spine. 3. Aortic Atherosclerosis (ICD10-I70.0). 4. 3.4 cm infrarenal abdominal aortic aneurysm. Recommend followup by abdomen and pelvis CTA in 6 months, and vascular surgery referral/consultation if not already obtained. This recommendation follows ACR consensus guidelines: White Paper of the ACR Incidental Findings Committee II on Vascular Findings. J Am Coll Radiol 2013; 10:789-794. Aortic aneurysm NOS (ICD10-I71.9) 5. Coronary artery calcifications are present. Please note that the presence of coronary artery calcium documents the presence of coronary artery disease, the severity of this disease and any potential stenosis cannot be assessed on this non-gated CT examination. 6. Cortical scarring seen in the right kidney. These results were called by telephone at the time of interpretation on 04/03/2020 at 10:27 pm to provider ADAM CURATOLO , who verbally acknowledged these results. Electronically Signed: By: Lovena Le M.D. On: 04/03/2020 22:48   CT Cervical Spine Wo Contrast  Addendum Date: 04/03/2020   ADDENDUM REPORT: 04/03/2020 23:05 ADDENDUM: Error identified in the physician notification attestation below -- should read: These results  were called by telephone at the time of interpretation on 04/03/2020 at 10:27 pm to provider Dr. Kae Heller of surgery, who verbally acknowledged these results. Electronically Signed   By: Lovena Le M.D.   On: 04/03/2020 23:05   Result Date: 04/03/2020 CLINICAL DATA:  Level 1 trauma: Fell 20 feet from tree. EXAM: CT HEAD WITHOUT CONTRAST CT CERVICAL SPINE WITHOUT CONTRAST CT CHEST, ABDOMEN AND PELVIS WITH CONTRAST TECHNIQUE: Contiguous axial images were obtained from the base of the skull through the vertex without intravenous contrast. Multidetector CT imaging of the cervical spine was performed without intravenous contrast. Multiplanar CT image reconstructions were also generated. Multidetector CT imaging of the chest, abdomen and pelvis was performed following the standard protocol during bolus administration of intravenous contrast. CONTRAST:  100 cc Omnipaque 350 IV COMPARISON:  Portable chest and pelvic radiograph same day FINDINGS: CT HEAD FINDINGS Brain: Likely remote lacune in the left external capsule/insula. No evidence of acute infarction, hemorrhage, hydrocephalus, extra-axial collection or mass lesion/mass effect. Symmetric prominence of the ventricles, cisterns and sulci compatible with parenchymal volume  loss. Patchy areas of white matter hypoattenuation are most compatible with chronic microvascular angiopathy. Vascular: Atherosclerotic calcification of the carotid siphons. No hyperdense vessel. Skull: No focal scalp swelling or hematoma. No calvarial fracture or suspicious osseous lesion. Punctate dermal hyperdensities in the right frontal and left frontal scalp may reflect debris versus benign dermal calcification. Sinuses/Orbits: Minimal pneumatized secretions in the right maxillary sinus. Remaining paranasal sinuses mastoid air cells are predominantly clear. Middle ear cavities are clear. Ossicular chains appear normally configured. Included orbital structures are unremarkable. Other: None CT  CERVICAL SPINE FINDINGS Alignment: Preservation of the normal cervical lordosis. Minimal retrolisthesis C3 on C4 and C5 on C6 are likely on a degenerative basis given spondylitic and facet degenerative changes at these levels. No evidence of traumatic listhesis. No abnormally widened, perched or jumped facets. Asymmetric left facet arthropathy is noted C4-C6. Normal alignment of the craniocervical and atlantoaxial articulations. Skull base and vertebrae: No acute fracture of the skull base or cervical vertebral bodies. Anterior wedging compression deformity of the T1 vertebral level (AO spine A1). Compression deformity of T2 is better detailed on chest CT and dedicated thoracic recon Speer posterior elements are intact. No worrisome osseous lesions. Soft tissues and spinal canal: No pre or paravertebral fluid or swelling. No visible canal hematoma. Disc levels: Multilevel intervertebral disc height loss with spondylitic endplate changes. Disc osteophyte complexes are present throughout the cervical spine with at most mild canal narrowing at the C5-6 and C6-7 levels secondary to posterior disc osteophyte complexes. CT CHEST FINDINGS Cardiovascular: The aortic root is suboptimally assessed given cardiac pulsation artifact. Calcifications upon the aortic leaflets. Atherosclerotic plaque within the normal caliber aorta. No intramural hematoma, dissection flap or other acute luminal abnormality of the aorta is seen. No periaortic stranding or hemorrhage. Shared origin of the brachiocephalic and left common carotid artery. Tortuosity of the brachiocephalic vasculature with minimal plaque in the proximal great vessels which are otherwise normally opacified. Central pulmonary arteries are normal caliber no large central filling defects though poorly assessed given suboptimal contrast timing for evaluation of the pulmonary arteries. Normal heart size. No pericardial effusion. Coronary artery atherosclerosis is present.  Mediastinum/Nodes: No mediastinal fluid or gas. Normal thyroid gland and thoracic inlet. No acute abnormality of the trachea or esophagus. No worrisome mediastinal, hilar or axillary adenopathy. Lungs/Pleura: There is a trace anterior apical right pneumothorax (3/10). Small right pleural effusion posteriorly. No left pneumothorax or effusion. Some ground-glass in the adjacent posterior bilateral lower lobes, right greater than left, may reflect combination of atelectasis and contusive changes with multiple rib fractures detailed below. No other acute traumatic abnormality of the lung parenchyma. Musculoskeletal: Anterior wedging compression deformity at T1 (AO spine A1) with 40% height loss anteriorly. Pincer type deformity of T2 with 60% height loss (AO spine A2). Incomplete burst fracture T3 superior endplate with up to D34-534 height loss (AO spine A3). 2 mm of retropulsion of the superior endplate fracture fragments. Atypical widening of the spinous processes of T2-3 and T3-4 some adjacent stranding and thickening, could suggest some underlying disruption of the posterior tension band. Fracture of the right transverse process T3 (AO spine A0). Multiple contiguous posterior 4th through 11th right rib fractures including additional lateral segmental fractures of the fourth through eighth ribs. Contiguous posterior fractures of the left 3rd to 12th ribs with more lateral segmental components of the ninth through eleventh ribs. There is a highly comminuted and displaced fracture of the left scapula extending predominantly through the inferior scapular body extending to  the base of the scapular spine and glenoid. There is associated intramuscular contusion in likely hemorrhage within the infraspinatus and subscapularis muscle bellies. CT ABDOMEN PELVIS FINDINGS Hepatobiliary: No direct hepatic injury or perihepatic hematoma. No focal liver abnormality is seen. No gallstones, gallbladder wall thickening, or biliary  dilatation. Pancreas: No direct pancreatic contusive change. Unremarkable. No pancreatic ductal dilatation or surrounding inflammatory changes. Spleen: No direct splenic injury or perisplenic hematoma. Normal splenic size. No concerning splenic lesion. Adrenals/Urinary Tract: Normal adrenal glands, no evidence of adrenal hematoma or hemorrhage. No direct renal lesion or perinephric hemorrhage. Areas of cortical scarring throughout the right kidney are present. No suspicious renal lesion, urolithiasis or hydronephrosis. No Urinary bladder is largely decompressed at the time of exam and therefore poorly evaluated by CT imaging. No evidence of direct bladder injury. Stomach/Bowel: Motion artifact may limit detection of subtle bowel and mesenteric abnormalities. Distal esophagus, stomach and duodenal sweep are unremarkable. No small bowel wall thickening or dilatation. No evidence of obstruction. Fecalized contents of the distal small bowel may reflect some slowed intestinal transit. The appendix is not well visualized. No colonic dilatation or wall thickening. No direct mesenteric contusion or hemorrhage is evident. Vascular/Lymphatic: No acute aortic injury or dissection is seen. There is a 3.4 cm infrarenal abdominal aortic aneurysm without periaortic stranding or hemorrhage. Atherosclerotic calcifications noted throughout the abdominal aorta and branch vessels. No suspicious or enlarged lymph nodes in the included lymphatic chains. Reproductive: The prostate and seminal vesicles are unremarkable. Other: There is a small volume of intermediate attenuation (27 HU) fluid in the deep pelvis without a clear source of bleeding or hemorrhage. Musculoskeletal: Dedicated lumbar reconstructions were generated and dictated separately. In brief there is a likely fracture of the left L1 and L2 transverse processes without vertebral body fracture or height loss. The bones of pelvis appear intact and congruent without acute  osseous injury. IMPRESSION: Traumatic Findings: 1. No acute intracranial abnormality. 2. No acute cervical fracture or traumatic listhesis. 3. Anterior wedging compression deformity of the T1 vertebral level (AO spine A1) with up to 45% height loss anteriorly. 4. Pincer type deformity of T2 with up to 60% height loss (AO spine A2). 5. Incomplete burst fracture superior endplate T3 (AO spine A3). Up to 2 mm of retropulsion and at most mild canal stenosis. 6. Fracture of the right transverse process T3 (AO spine A0). 7. Widening of the spinous processes of T2-3 and T3-4. Cannot exclude disruption of the posterior tension band. Consider MRI for further assessment. 8. Comminuted and displaced fracture of the left scapula involving the inferior scapular body extending to base of the scapular spine and glenoid. Associated intramuscular contusion/hemorrhage within the infraspinatus and subscapularis. 9. Contiguous fourth through eleventh right rib fractures including segmental fractures the fourth through eighth ribs. Contiguous third through eleventh left rib fractures including segmental fractures of the left ninth through eleventh ribs. Given the extent of the segmental rib fractures assessment for flail chest morphology is recommended. 10. Trace anterior apical right pneumothorax.  Small right effusion. 11. Dependent ground-glass in both lungs may reflect a combination of atelectasis and or minimal pulmonary contusive change. 12. Fractures of the L1 and L2 left transverse processes. 13. Small volume of intermediate attenuation fluid in the abdomen and pelvis without clear source such as direct bowel injury or mesenteric hematoma or contusion though motion artifact may limit detection of subtle bowel and mesenteric anomaly. Recommend close clinical monitoring and serial abdominal examinations. Nontraumatic Findings: 1. 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 2. Multilevel  cervical spondylitic changes as well as additional multilevel degenerative changes in the thoracic and lumbar spine. 3. Aortic Atherosclerosis (ICD10-I70.0). 4. 3.4 cm infrarenal abdominal aortic aneurysm. Recommend followup by abdomen and pelvis CTA in 6 months, and vascular surgery referral/consultation if not already obtained. This recommendation follows ACR consensus guidelines: White Paper of the ACR Incidental Findings Committee II on Vascular Findings. J Am Coll Radiol 2013; 10:789-794. Aortic aneurysm NOS (ICD10-I71.9) 5. Coronary artery calcifications are present. Please note that the presence of coronary artery calcium documents the presence of coronary artery disease, the severity of this disease and any potential stenosis cannot be assessed on this non-gated CT examination. 6. Cortical scarring seen in the right kidney. These results were called by telephone at the time of interpretation on 04/03/2020 at 10:27 pm to provider ADAM CURATOLO , who verbally acknowledged these results. Electronically Signed: By: Lovena Le M.D. On: 04/03/2020 22:48   CT ABDOMEN PELVIS W CONTRAST  Addendum Date: 04/03/2020   ADDENDUM REPORT: 04/03/2020 23:05 ADDENDUM: Error identified in the physician notification attestation below -- should read: These results were called by telephone at the time of interpretation on 04/03/2020 at 10:27 pm to provider Dr. Kae Heller of surgery, who verbally acknowledged these results. Electronically Signed   By: Lovena Le M.D.   On: 04/03/2020 23:05   Result Date: 04/03/2020 CLINICAL DATA:  Level 1 trauma: Fell 20 feet from tree. EXAM: CT HEAD WITHOUT CONTRAST CT CERVICAL SPINE WITHOUT CONTRAST CT CHEST, ABDOMEN AND PELVIS WITH CONTRAST TECHNIQUE: Contiguous axial images were obtained from the base of the skull through the vertex without intravenous contrast. Multidetector CT imaging of the cervical spine was performed without intravenous contrast. Multiplanar CT image reconstructions  were also generated. Multidetector CT imaging of the chest, abdomen and pelvis was performed following the standard protocol during bolus administration of intravenous contrast. CONTRAST:  100 cc Omnipaque 350 IV COMPARISON:  Portable chest and pelvic radiograph same day FINDINGS: CT HEAD FINDINGS Brain: Likely remote lacune in the left external capsule/insula. No evidence of acute infarction, hemorrhage, hydrocephalus, extra-axial collection or mass lesion/mass effect. Symmetric prominence of the ventricles, cisterns and sulci compatible with parenchymal volume loss. Patchy areas of white matter hypoattenuation are most compatible with chronic microvascular angiopathy. Vascular: Atherosclerotic calcification of the carotid siphons. No hyperdense vessel. Skull: No focal scalp swelling or hematoma. No calvarial fracture or suspicious osseous lesion. Punctate dermal hyperdensities in the right frontal and left frontal scalp may reflect debris versus benign dermal calcification. Sinuses/Orbits: Minimal pneumatized secretions in the right maxillary sinus. Remaining paranasal sinuses mastoid air cells are predominantly clear. Middle ear cavities are clear. Ossicular chains appear normally configured. Included orbital structures are unremarkable. Other: None CT CERVICAL SPINE FINDINGS Alignment: Preservation of the normal cervical lordosis. Minimal retrolisthesis C3 on C4 and C5 on C6 are likely on a degenerative basis given spondylitic and facet degenerative changes at these levels. No evidence of traumatic listhesis. No abnormally widened, perched or jumped facets. Asymmetric left facet arthropathy is noted C4-C6. Normal alignment of the craniocervical and atlantoaxial articulations. Skull base and vertebrae: No acute fracture of the skull base or cervical vertebral bodies. Anterior wedging compression deformity of the T1 vertebral level (AO spine A1). Compression deformity of T2 is better detailed on chest CT and  dedicated thoracic recon Speer posterior elements are intact. No worrisome osseous lesions. Soft tissues and spinal canal: No pre or paravertebral fluid or swelling. No visible  canal hematoma. Disc levels: Multilevel intervertebral disc height loss with spondylitic endplate changes. Disc osteophyte complexes are present throughout the cervical spine with at most mild canal narrowing at the C5-6 and C6-7 levels secondary to posterior disc osteophyte complexes. CT CHEST FINDINGS Cardiovascular: The aortic root is suboptimally assessed given cardiac pulsation artifact. Calcifications upon the aortic leaflets. Atherosclerotic plaque within the normal caliber aorta. No intramural hematoma, dissection flap or other acute luminal abnormality of the aorta is seen. No periaortic stranding or hemorrhage. Shared origin of the brachiocephalic and left common carotid artery. Tortuosity of the brachiocephalic vasculature with minimal plaque in the proximal great vessels which are otherwise normally opacified. Central pulmonary arteries are normal caliber no large central filling defects though poorly assessed given suboptimal contrast timing for evaluation of the pulmonary arteries. Normal heart size. No pericardial effusion. Coronary artery atherosclerosis is present. Mediastinum/Nodes: No mediastinal fluid or gas. Normal thyroid gland and thoracic inlet. No acute abnormality of the trachea or esophagus. No worrisome mediastinal, hilar or axillary adenopathy. Lungs/Pleura: There is a trace anterior apical right pneumothorax (3/10). Small right pleural effusion posteriorly. No left pneumothorax or effusion. Some ground-glass in the adjacent posterior bilateral lower lobes, right greater than left, may reflect combination of atelectasis and contusive changes with multiple rib fractures detailed below. No other acute traumatic abnormality of the lung parenchyma. Musculoskeletal: Anterior wedging compression deformity at T1 (AO  spine A1) with 40% height loss anteriorly. Pincer type deformity of T2 with 60% height loss (AO spine A2). Incomplete burst fracture T3 superior endplate with up to D34-534 height loss (AO spine A3). 2 mm of retropulsion of the superior endplate fracture fragments. Atypical widening of the spinous processes of T2-3 and T3-4 some adjacent stranding and thickening, could suggest some underlying disruption of the posterior tension band. Fracture of the right transverse process T3 (AO spine A0). Multiple contiguous posterior 4th through 11th right rib fractures including additional lateral segmental fractures of the fourth through eighth ribs. Contiguous posterior fractures of the left 3rd to 12th ribs with more lateral segmental components of the ninth through eleventh ribs. There is a highly comminuted and displaced fracture of the left scapula extending predominantly through the inferior scapular body extending to the base of the scapular spine and glenoid. There is associated intramuscular contusion in likely hemorrhage within the infraspinatus and subscapularis muscle bellies. CT ABDOMEN PELVIS FINDINGS Hepatobiliary: No direct hepatic injury or perihepatic hematoma. No focal liver abnormality is seen. No gallstones, gallbladder wall thickening, or biliary dilatation. Pancreas: No direct pancreatic contusive change. Unremarkable. No pancreatic ductal dilatation or surrounding inflammatory changes. Spleen: No direct splenic injury or perisplenic hematoma. Normal splenic size. No concerning splenic lesion. Adrenals/Urinary Tract: Normal adrenal glands, no evidence of adrenal hematoma or hemorrhage. No direct renal lesion or perinephric hemorrhage. Areas of cortical scarring throughout the right kidney are present. No suspicious renal lesion, urolithiasis or hydronephrosis. No Urinary bladder is largely decompressed at the time of exam and therefore poorly evaluated by CT imaging. No evidence of direct bladder injury.  Stomach/Bowel: Motion artifact may limit detection of subtle bowel and mesenteric abnormalities. Distal esophagus, stomach and duodenal sweep are unremarkable. No small bowel wall thickening or dilatation. No evidence of obstruction. Fecalized contents of the distal small bowel may reflect some slowed intestinal transit. The appendix is not well visualized. No colonic dilatation or wall thickening. No direct mesenteric contusion or hemorrhage is evident. Vascular/Lymphatic: No acute aortic injury or dissection is seen. There is a 3.4 cm  infrarenal abdominal aortic aneurysm without periaortic stranding or hemorrhage. Atherosclerotic calcifications noted throughout the abdominal aorta and branch vessels. No suspicious or enlarged lymph nodes in the included lymphatic chains. Reproductive: The prostate and seminal vesicles are unremarkable. Other: There is a small volume of intermediate attenuation (27 HU) fluid in the deep pelvis without a clear source of bleeding or hemorrhage. Musculoskeletal: Dedicated lumbar reconstructions were generated and dictated separately. In brief there is a likely fracture of the left L1 and L2 transverse processes without vertebral body fracture or height loss. The bones of pelvis appear intact and congruent without acute osseous injury. IMPRESSION: Traumatic Findings: 1. No acute intracranial abnormality. 2. No acute cervical fracture or traumatic listhesis. 3. Anterior wedging compression deformity of the T1 vertebral level (AO spine A1) with up to 45% height loss anteriorly. 4. Pincer type deformity of T2 with up to 60% height loss (AO spine A2). 5. Incomplete burst fracture superior endplate T3 (AO spine A3). Up to 2 mm of retropulsion and at most mild canal stenosis. 6. Fracture of the right transverse process T3 (AO spine A0). 7. Widening of the spinous processes of T2-3 and T3-4. Cannot exclude disruption of the posterior tension band. Consider MRI for further assessment. 8.  Comminuted and displaced fracture of the left scapula involving the inferior scapular body extending to base of the scapular spine and glenoid. Associated intramuscular contusion/hemorrhage within the infraspinatus and subscapularis. 9. Contiguous fourth through eleventh right rib fractures including segmental fractures the fourth through eighth ribs. Contiguous third through eleventh left rib fractures including segmental fractures of the left ninth through eleventh ribs. Given the extent of the segmental rib fractures assessment for flail chest morphology is recommended. 10. Trace anterior apical right pneumothorax.  Small right effusion. 11. Dependent ground-glass in both lungs may reflect a combination of atelectasis and or minimal pulmonary contusive change. 12. Fractures of the L1 and L2 left transverse processes. 13. Small volume of intermediate attenuation fluid in the abdomen and pelvis without clear source such as direct bowel injury or mesenteric hematoma or contusion though motion artifact may limit detection of subtle bowel and mesenteric anomaly. Recommend close clinical monitoring and serial abdominal examinations. Nontraumatic Findings: 1. 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 2. Multilevel cervical spondylitic changes as well as additional multilevel degenerative changes in the thoracic and lumbar spine. 3. Aortic Atherosclerosis (ICD10-I70.0). 4. 3.4 cm infrarenal abdominal aortic aneurysm. Recommend followup by abdomen and pelvis CTA in 6 months, and vascular surgery referral/consultation if not already obtained. This recommendation follows ACR consensus guidelines: White Paper of the ACR Incidental Findings Committee II on Vascular Findings. J Am Coll Radiol 2013; 10:789-794. Aortic aneurysm NOS (ICD10-I71.9) 5. Coronary artery calcifications are present. Please note that the presence of coronary artery calcium documents the presence of coronary  artery disease, the severity of this disease and any potential stenosis cannot be assessed on this non-gated CT examination. 6. Cortical scarring seen in the right kidney. These results were called by telephone at the time of interpretation on 04/03/2020 at 10:27 pm to provider ADAM CURATOLO , who verbally acknowledged these results. Electronically Signed: By: Lovena Le M.D. On: 04/03/2020 22:48   DG Pelvis Portable  Result Date: 04/03/2020 CLINICAL DATA:  [place] out of tree, pain EXAM: PORTABLE PELVIS 1-2 VIEWS COMPARISON:  None. FINDINGS: Supine frontal view of the pelvis including both hips demonstrates no displaced fractures. Alignment is anatomic. Soft tissues are unremarkable. IMPRESSION: 1. No acute pelvic fracture.  Electronically Signed   By: Randa Ngo M.D.   On: 04/03/2020 21:52   CT T-SPINE NO CHARGE  Result Date: 04/03/2020 CLINICAL DATA:  Level 1 trauma: Fell 20 feet from tree EXAM: CT THORACIC, AND LUMBAR SPINE WITHOUT CONTRAST TECHNIQUE: Multiplanar CT reconstructions of the thoracic and lumbar spine were generated from contemporary CT of the chest, abdomen and pelvis in the setting of trauma. COMPARISON:  Contemporary CT of the chest, abdomen and pelvis, radiograph of the chest and pelvis FINDINGS: CT THORACIC SPINE FINDINGS Alignment: Mild straightening of the normal lower thoracic kyphosis with slight exaggeration superiorly. Mild atypical widening of the T2-3 and T3-4 spinous processes beyond what is expected for the kyphotic curvature. Vertebrae: Multiple osseous injuries include: *Anterior wedging compression deformity at T1 with 40% height loss anteriorly (AOSpine A1). *Pincer type deformity of T2 with 60% height loss (AOSpine A2). *Incomplete burst fracture T3 superior endplate with up to D34-534 height loss (AO spine A3). 2 mm of retropulsion of the superior endplate fracture fragments. *Fracture of the right transverse process T3 (AO spine A0). *Thin sclerotic bands seen in the  superior endplate T5 and T8 and extending from the inferior plate of T6 are indeterminate but could reflect subcortical impaction fracture in the setting of significant trauma. Would be better assessed with MR. *Multiple contiguous bilateral rib fractures better detailed on dedicated CT of the chest from which this study is reconstructed. Paraspinal and other soft tissues: The thickening and likely trace hemorrhage adjacent the T1-T3 compression deformities. No other paraspinal fluid or swelling. No visible canal hematoma. Slight bowing of the supraspinous ligament T2-3. Some adjacent soft tissue stranding though this may be related to posterior contusive changes in a comminuted left scapular fracture. Disc levels: Retropulsion of fracture fragments at T3 result in some minimal effacement of the ventral thecal sac without significant canal stenosis. No other significant narrowing or foraminal stenosis is seen within the thoracic spine. CT LUMBAR SPINE FINDINGS Segmentation: 5 lumbar type vertebrae are present. Alignment: Slight straightening of the normal lumbar lordosis. Mild retrolisthesis L3 on 4 of approximately 2 mm may be degenerative with some early discogenic change at this level. No associated spondylolysis. Vertebrae: Minimally displaced fractures of the left T1 and T2 transverse processes. No vertebral body fracture or height loss is evident. Multilevel discogenic changes as detailed below.Included portions of the bony pelvis are intact. Paraspinal and other soft tissues: Small amount of thickening in stranding adjacent the left T1 and T2 transverse processes. No over paraspinal thickening swelling or hemorrhage. Some mild contusive changes noted posteriorly. For additional findings in the abdomen and pelvis, please see dedicated CT chest, abdomen and pelvis from which this study is reconstructed. Disc levels: Multilevel intervertebral disc height loss is present with some mild global disc bulging most  pronounced C3-4 and C4-5 where there is resulting mild canal stenosis. A shallow calcified central disc protrusion is noted at L5-S1 without significant resulting stenosis. Aforementioned disc bulges and mild facet degenerative changes result in mild bilateral foraminal narrowing C3-4 and C4-5. IMPRESSION: CT THORACIC SPINE 1. Anterior wedging compression deformity at T1 with 40% height loss anteriorly (AOSpine A1). 2. Pincer type deformity of T2 with 60% height loss (AOSpine A2). 3. Incomplete burst fracture T3 superior endplate with up to D34-534 height loss (AOSpine A3) and of the right transverse process T3 (AOSpine A0). 4. Widening of the interspinous distances T2-3 and T3-4 slightly greater than expected for the degree of kyphosis. Given the vertebral body fractures at these  levels, involvement of the posterior tension band is not fully excluded. Consider further evaluation with MRI. 5. Thin sclerotic bands in the superior endplate T5 and T8 and extending from the inferior plate of T6 are indeterminate but could reflect subcortical impaction fracture in the setting of significant trauma. CT LUMBAR SPINE 1. Nondisplaced fractures of the left L1 and L2 transverse processes. 2. No acute vertebral body fracture or vertebral body height loss identified. 3. Multilevel discogenic changes most pronounced C3-4 and C4-5 with resulting mild canal stenosis. 4. Mild retrolisthesis L3 on 4 may be degenerative with some early discogenic change at this level. For additional findings within the chest, abdomen and pelvis, please see dedicated CT from which this study is reconstructed. These results were called by telephone at the time of interpretation on 04/03/2020 at 10: 27 pm to provider MD Kae Heller of surgery , who verbally acknowledged these results. Electronically Signed   By: Lovena Le M.D.   On: 04/03/2020 23:04   CT L-SPINE NO CHARGE  Result Date: 04/03/2020 CLINICAL DATA:  Level 1 trauma: Fell 20 feet from tree EXAM:  CT THORACIC, AND LUMBAR SPINE WITHOUT CONTRAST TECHNIQUE: Multiplanar CT reconstructions of the thoracic and lumbar spine were generated from contemporary CT of the chest, abdomen and pelvis in the setting of trauma. COMPARISON:  Contemporary CT of the chest, abdomen and pelvis, radiograph of the chest and pelvis FINDINGS: CT THORACIC SPINE FINDINGS Alignment: Mild straightening of the normal lower thoracic kyphosis with slight exaggeration superiorly. Mild atypical widening of the T2-3 and T3-4 spinous processes beyond what is expected for the kyphotic curvature. Vertebrae: Multiple osseous injuries include: *Anterior wedging compression deformity at T1 with 40% height loss anteriorly (AOSpine A1). *Pincer type deformity of T2 with 60% height loss (AOSpine A2). *Incomplete burst fracture T3 superior endplate with up to D34-534 height loss (AO spine A3). 2 mm of retropulsion of the superior endplate fracture fragments. *Fracture of the right transverse process T3 (AO spine A0). *Thin sclerotic bands seen in the superior endplate T5 and T8 and extending from the inferior plate of T6 are indeterminate but could reflect subcortical impaction fracture in the setting of significant trauma. Would be better assessed with MR. *Multiple contiguous bilateral rib fractures better detailed on dedicated CT of the chest from which this study is reconstructed. Paraspinal and other soft tissues: The thickening and likely trace hemorrhage adjacent the T1-T3 compression deformities. No other paraspinal fluid or swelling. No visible canal hematoma. Slight bowing of the supraspinous ligament T2-3. Some adjacent soft tissue stranding though this may be related to posterior contusive changes in a comminuted left scapular fracture. Disc levels: Retropulsion of fracture fragments at T3 result in some minimal effacement of the ventral thecal sac without significant canal stenosis. No other significant narrowing or foraminal stenosis is seen  within the thoracic spine. CT LUMBAR SPINE FINDINGS Segmentation: 5 lumbar type vertebrae are present. Alignment: Slight straightening of the normal lumbar lordosis. Mild retrolisthesis L3 on 4 of approximately 2 mm may be degenerative with some early discogenic change at this level. No associated spondylolysis. Vertebrae: Minimally displaced fractures of the left T1 and T2 transverse processes. No vertebral body fracture or height loss is evident. Multilevel discogenic changes as detailed below.Included portions of the bony pelvis are intact. Paraspinal and other soft tissues: Small amount of thickening in stranding adjacent the left T1 and T2 transverse processes. No over paraspinal thickening swelling or hemorrhage. Some mild contusive changes noted posteriorly. For additional findings in the  abdomen and pelvis, please see dedicated CT chest, abdomen and pelvis from which this study is reconstructed. Disc levels: Multilevel intervertebral disc height loss is present with some mild global disc bulging most pronounced C3-4 and C4-5 where there is resulting mild canal stenosis. A shallow calcified central disc protrusion is noted at L5-S1 without significant resulting stenosis. Aforementioned disc bulges and mild facet degenerative changes result in mild bilateral foraminal narrowing C3-4 and C4-5. IMPRESSION: CT THORACIC SPINE 1. Anterior wedging compression deformity at T1 with 40% height loss anteriorly (AOSpine A1). 2. Pincer type deformity of T2 with 60% height loss (AOSpine A2). 3. Incomplete burst fracture T3 superior endplate with up to D34-534 height loss (AOSpine A3) and of the right transverse process T3 (AOSpine A0). 4. Widening of the interspinous distances T2-3 and T3-4 slightly greater than expected for the degree of kyphosis. Given the vertebral body fractures at these levels, involvement of the posterior tension band is not fully excluded. Consider further evaluation with MRI. 5. Thin sclerotic bands in  the superior endplate T5 and T8 and extending from the inferior plate of T6 are indeterminate but could reflect subcortical impaction fracture in the setting of significant trauma. CT LUMBAR SPINE 1. Nondisplaced fractures of the left L1 and L2 transverse processes. 2. No acute vertebral body fracture or vertebral body height loss identified. 3. Multilevel discogenic changes most pronounced C3-4 and C4-5 with resulting mild canal stenosis. 4. Mild retrolisthesis L3 on 4 may be degenerative with some early discogenic change at this level. For additional findings within the chest, abdomen and pelvis, please see dedicated CT from which this study is reconstructed. These results were called by telephone at the time of interpretation on 04/03/2020 at 10: 27 pm to provider MD Kae Heller of surgery , who verbally acknowledged these results. Electronically Signed   By: Lovena Le M.D.   On: 04/03/2020 23:04   DG Chest Port 1 View  Result Date: 04/04/2020 CLINICAL DATA:  Rib fractures. EXAM: PORTABLE CHEST 1 VIEW COMPARISON:  Chest CT from yesterday FINDINGS: Normal heart size and mediastinal contours. Curvilinear structure along the left descending aorta is presumably atelectasis when compared with recent chest CT. Rib fractures are subtle by radiography. No visible effusion or pneumothorax. IMPRESSION: Mild atelectatic change.  No visible air leak. Electronically Signed   By: Monte Fantasia M.D.   On: 04/04/2020 08:05   DG Chest Port 1 View  Result Date: 04/03/2020 CLINICAL DATA:  [place] [address] 20 feet, short of breath, left-sided chest pain EXAM: PORTABLE CHEST 1 VIEW COMPARISON:  None. FINDINGS: Single frontal view of the chest demonstrates an unremarkable cardiac silhouette. No airspace disease, effusion, or pneumothorax on this supine evaluation. There is a minimally displaced right lateral seventh rib fracture. There is lucency through the infraspinous region of the scapula which may reflect fracture. IMPRESSION: 1.  Suspected left scapular fracture. 2. Minimally displaced right lateral seventh rib fracture. 3. Otherwise no acute intrathoracic process. Electronically Signed   By: Randa Ngo M.D.   On: 04/03/2020 21:50    Anti-infectives: Anti-infectives (From admission, onward)   None      Assessment/Plan: s/p * No surgery found * Continue ice chips for now. likely to develop ileus  T1 wedge fx, T2 pincer type deformity, T3 superior endplate incomplete burst fx with 61mm retropulsion and mild canal stenosis, R T3 TP fx; L1-2 L TP fx, Possible posterior tension band disruption at T2-4: Neurosurgery consulted, Dr. Christella Noa at bedside. Recommends continuing C-Collar, no surgical intervention indicated  Left scapula fx: orthopedic surgery consulted (Dr. Lucia Gaskins)  R 4-11 rib fx, segmental 4-8; L 3-11 rib fx, segmental 9-11, trace R apical pneumothorax, small R effusion, atelectasis/ pulm contusion: pulls less than 500 on IS. 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

## 2020-04-04 NOTE — Consult Note (Signed)
Reason for Consult:thoracic spine fractures Referring Physician: [name] is an 64 y.o. male. Whom fell out of a tree while at work. He continued to work, only coming to the ED shortness of breath and neck pain.  HPI: CT showed three thoracic fractures T1-3. No canal compromise, and no neurologic deficits.He sustained multiple rib fractures, small pnuemothorax and a pulmonary contusion.I have been asked to see Jordan [name] for treatment recommendations.   History reviewed. No pertinent past medical history.  History reviewed. No pertinent surgical history.  No family history on file.  Social History:  reports that he has never smoked. He has never used smokeless tobacco. He reports current drug use. Drug: Cocaine. He reports that he does not drink alcohol.  Allergies: No Known Allergies  Medications: I have reviewed the patient's current medications.  Results for orders placed or performed during the hospital encounter of 04/03/20 (from the past 48 hour(s))  Sample to Blood Bank     Status: None   Collection Time: 04/03/20  9:36 PM  Result Value Ref Range   Blood Bank Specimen SAMPLE AVAILABLE FOR TESTING    Sample Expiration      04/04/2020,2359 Performed at [facility], [place], [place] [address] [place], [place] [address]   I-Stat Chem 8, ED     Status: Abnormal   Collection Time: 04/03/20  9:40 PM  Result Value Ref Range   Sodium 138 135 - 145 mmol/L   Potassium 4.1 3.5 - 5.1 mmol/L   Chloride 101 98 - 111 mmol/L   BUN 18 8 - 23 mg/dL   Creatinine, Ser 1.10 0.61 - 1.24 mg/dL   Glucose, Bld 134 (H) 70 - 99 mg/dL    Comment: Glucose reference range applies only to samples taken after fasting for at least 8 hours.   Calcium, Ion 1.03 (L) 1.15 - 1.40 mmol/L   TCO2 28 22 - 32 mmol/L   Hemoglobin 16.3 13.0 - 17.0 g/dL   HCT 48.0 39.0 - 52.0 %  SARS Coronavirus 2 by RT PCR (hospital order, performed in [facility] hospital lab) Nasopharyngeal Nasopharyngeal Swab      Status: None   Collection Time: 04/03/20  9:41 PM   Specimen: Nasopharyngeal Swab  Result Value Ref Range   SARS Coronavirus 2 NEGATIVE NEGATIVE    Comment: (NOTE) SARS-CoV-2 target nucleic acids are NOT DETECTED. The SARS-CoV-2 RNA is generally detectable in upper and lower respiratory specimens during the acute phase of infection. The lowest concentration of SARS-CoV-2 viral copies this assay can detect is 250 copies / mL. A negative result does not preclude SARS-CoV-2 infection and should not be used as the sole basis for treatment or other patient management decisions.  A negative result may occur with improper specimen collection / handling, submission of specimen other than nasopharyngeal swab, presence of viral mutation(s) within the areas targeted by this assay, and inadequate number of viral copies (<250 copies / mL). A negative result must be combined with clinical observations, patient history, and epidemiological information. Fact Sheet for Patients:   StrictlyIdeas.no Fact Sheet for Healthcare Providers: BankingDealers.co.za This test is not yet approved or cleared  by the [place] FDA and has been authorized for detection and/or diagnosis of SARS-CoV-2 by FDA under an Emergency Use Authorization (EUA).  This EUA will remain in effect (meaning this test can be used) for the duration of the COVID-19 declaration under Section 564(b)(1) of the Act, 21 U.S.C. section 360bbb-3(b)(1), unless the authorization is  terminated or revoked sooner. Performed at [facility], [place] [address] [place], [place] [address]   Comprehensive metabolic panel     Status: Abnormal   Collection Time: 04/03/20  9:42 PM  Result Value Ref Range   Sodium 134 (L) 135 - 145 mmol/L   Potassium 4.2 3.5 - 5.1 mmol/L   Chloride 98 98 - 111 mmol/L   CO2 24 22 - 32 mmol/L   Glucose, Bld 149 (H) 70 - 99 mg/dL    Comment: Glucose reference range  applies only to samples taken after fasting for at least 8 hours.   BUN 16 8 - 23 mg/dL   Creatinine, Ser 1.20 0.61 - 1.24 mg/dL   Calcium 8.4 (L) 8.9 - 10.3 mg/dL   Total Protein 6.6 6.5 - 8.1 g/dL   Albumin 3.6 3.5 - 5.0 g/dL   AST 91 (H) 15 - 41 U/L   ALT 71 (H) 0 - 44 U/L   Alkaline Phosphatase 64 38 - 126 U/L   Total Bilirubin 1.0 0.3 - 1.2 mg/dL   GFR calc non Af Amer >60 >60 mL/min   GFR calc Af Amer >60 >60 mL/min   Anion gap 12 5 - 15    Comment: Performed at [place] [address] [place] [address]  CBC     Status: None   Collection Time: 04/03/20  9:42 PM  Result Value Ref Range   WBC 7.9 4.0 - 10.5 K/uL   RBC 5.14 4.22 - 5.81 MIL/uL   Hemoglobin 15.5 13.0 - 17.0 g/dL   HCT 46.7 39.0 - 52.0 %   MCV 90.9 80.0 - 100.0 fL   MCH 30.2 26.0 - 34.0 pg   MCHC 33.2 30.0 - 36.0 g/dL   RDW 13.3 11.5 - 15.5 %   Platelets 167 150 - 400 K/uL   nRBC 0.0 0.0 - 0.2 %    Comment: Performed at [facility], [place] [address], [place], [place] [address]  Ethanol     Status: None   Collection Time: 04/03/20  9:42 PM  Result Value Ref Range   Alcohol, Ethyl (B) <10 <10 mg/dL    Comment: (NOTE) Lowest detectable limit for serum alcohol is 10 mg/dL. For medical purposes only. Performed at [facility], [place] [address], [place], [place] [address]   Lactic acid, plasma     Status: Abnormal   Collection Time: 04/03/20  9:42 PM  Result Value Ref Range   Lactic Acid, Venous 2.2 (HH) 0.5 - 1.9 mmol/L    Comment: CRITICAL RESULT CALLED TO, READ BACK BY AND VERIFIED WITH: Billie Lade 04/03/20 2231 WAYK Performed at [place] [address], [place], [place] [address]   Protime-INR     Status: None   Collection Time: 04/03/20  9:42 PM  Result Value Ref Range   Prothrombin Time 12.9 11.4 - 15.2 seconds   INR 1.0 0.8 - 1.2    Comment: (NOTE) INR goal varies based on device and disease states. Performed at [facility], [place] [address] [place], [place] [address]   MRSA PCR Screening     Status: None   Collection Time: 04/03/20 11:33 PM   Specimen: Nasopharyngeal  Result Value Ref Range   MRSA by PCR NEGATIVE NEGATIVE    Comment:        The GeneXpert MRSA Assay (FDA approved for NASAL specimens only), is one component of a comprehensive MRSA  colonization surveillance program. It is not intended to diagnose MRSA infection nor to guide or monitor treatment for MRSA infections. Performed at [facility], [place] [address], [place] [address]   CBC     Status: None   Collection Time: 04/04/20  4:56 AM  Result Value Ref Range   WBC 6.7 4.0 - 10.5 K/uL   RBC 4.87 4.22 - 5.81 MIL/uL   Hemoglobin 14.7 13.0 - 17.0 g/dL   HCT 43.7 39.0 - 52.0 %   MCV 89.7 80.0 - 100.0 fL   MCH 30.2 26.0 - 34.0 pg   MCHC 33.6 30.0 - 36.0 g/dL   RDW 13.4 11.5 - 15.5 %   Platelets 151 150 - 400 K/uL   nRBC 0.0 0.0 - 0.2 %    Comment: Performed at [facility], [place] [address] [place], [place] [id number]  Basic metabolic panel     Status: Abnormal   Collection Time: 04/04/20  4:56 AM  Result Value Ref Range   Sodium 136 135 - 145 mmol/L   Potassium 4.1 3.5 - 5.1 mmol/L   Chloride 106 98 - 111 mmol/L   CO2 22 22 - 32 mmol/L   Glucose, Bld 117 (H) 70 - 99 mg/dL    Comment: Glucose reference range applies only to samples taken after fasting for at least 8 hours.   BUN 14 8 - 23 mg/dL   Creatinine, Ser 1.00 0.61 - 1.24 mg/dL   Calcium 8.2 (L) 8.9 - 10.3 mg/dL   GFR calc non Af Amer >60 >60 mL/min   GFR calc Af Amer >60 >60 mL/min   Anion gap 8 5 - 15    Comment: Performed at [facility] [address] [place], [place] [address]  HIV Antibody (routine testing w rflx)     Status: None   Collection Time: 04/04/20  4:56 AM  Result Value Ref Range   HIV Screen 4th Generation wRfx Non Reactive Non Reactive    Comment: Performed at [facility], [place] [address], [place], [place] [address]    CT HEAD WO  CONTRAST  Addendum Date: 04/03/2020   ADDENDUM REPORT: 04/03/2020 23:05 ADDENDUM: Error identified in the physician notification attestation below -- should read: These results were called by telephone at the time of interpretation on 04/03/2020 at 10:27 pm to provider Dr. Kae Heller of surgery, who verbally acknowledged these results. Electronically Signed   By: Lovena Le M.D.   On: 04/03/2020 23:05   Result Date: 04/03/2020 CLINICAL DATA:  Level 1 trauma: Fell 20 feet from tree. EXAM: CT HEAD WITHOUT CONTRAST CT CERVICAL SPINE WITHOUT CONTRAST CT CHEST, ABDOMEN AND PELVIS WITH CONTRAST TECHNIQUE: Contiguous axial images were obtained from the base of the skull through the vertex without intravenous contrast. Multidetector CT imaging of the cervical spine was performed without intravenous contrast. Multiplanar CT image reconstructions were also generated. Multidetector CT imaging of the chest, abdomen and pelvis was performed following the standard protocol during bolus administration of intravenous contrast. CONTRAST:  100 cc Omnipaque 350 IV COMPARISON:  Portable chest and pelvic radiograph same day FINDINGS: CT HEAD FINDINGS Brain: Likely remote lacune in the left external capsule/insula. No evidence of acute infarction, hemorrhage, hydrocephalus, extra-axial collection or mass lesion/mass effect. Symmetric prominence of the ventricles, cisterns and sulci compatible with parenchymal volume loss. Patchy areas of white matter hypoattenuation are most compatible with chronic microvascular angiopathy. Vascular: Atherosclerotic calcification of the carotid siphons. No hyperdense vessel.  Skull: No focal scalp swelling or hematoma. No calvarial fracture or suspicious osseous lesion. Punctate dermal hyperdensities in the right frontal and left frontal scalp may reflect debris versus benign dermal calcification. Sinuses/Orbits: Minimal pneumatized secretions in the right maxillary sinus. Remaining paranasal sinuses  mastoid air cells are predominantly clear. Middle ear cavities are clear. Ossicular chains appear normally configured. Included orbital structures are unremarkable. Other: None CT CERVICAL SPINE FINDINGS Alignment: Preservation of the normal cervical lordosis. Minimal retrolisthesis C3 on C4 and C5 on C6 are likely on a degenerative basis given spondylitic and facet degenerative changes at these levels. No evidence of traumatic listhesis. No abnormally widened, perched or jumped facets. Asymmetric left facet arthropathy is noted C4-C6. Normal alignment of the craniocervical and atlantoaxial articulations. Skull base and vertebrae: No acute fracture of the skull base or cervical vertebral bodies. Anterior wedging compression deformity of the T1 vertebral level (AO spine A1). Compression deformity of T2 is better detailed on chest CT and dedicated thoracic recon Speer posterior elements are intact. No worrisome osseous lesions. Soft tissues and spinal canal: No pre or paravertebral fluid or swelling. No visible canal hematoma. Disc levels: Multilevel intervertebral disc height loss with spondylitic endplate changes. Disc osteophyte complexes are present throughout the cervical spine with at most mild canal narrowing at the C5-6 and C6-7 levels secondary to posterior disc osteophyte complexes. CT CHEST FINDINGS Cardiovascular: The aortic root is suboptimally assessed given cardiac pulsation artifact. Calcifications upon the aortic leaflets. Atherosclerotic plaque within the normal caliber aorta. No intramural hematoma, dissection flap or other acute luminal abnormality of the aorta is seen. No periaortic stranding or hemorrhage. Shared origin of the brachiocephalic and left common carotid artery. Tortuosity of the brachiocephalic vasculature with minimal plaque in the proximal great vessels which are otherwise normally opacified. Central pulmonary arteries are normal caliber no large central filling defects though  poorly assessed given suboptimal contrast timing for evaluation of the pulmonary arteries. Normal heart size. No pericardial effusion. Coronary artery atherosclerosis is present. Mediastinum/Nodes: No mediastinal fluid or gas. Normal thyroid gland and thoracic inlet. No acute abnormality of the trachea or esophagus. No worrisome mediastinal, hilar or axillary adenopathy. Lungs/Pleura: There is a trace anterior apical right pneumothorax (3/10). Small right pleural effusion posteriorly. No left pneumothorax or effusion. Some ground-glass in the adjacent posterior bilateral lower lobes, right greater than left, may reflect combination of atelectasis and contusive changes with multiple rib fractures detailed below. No other acute traumatic abnormality of the lung parenchyma. Musculoskeletal: Anterior wedging compression deformity at T1 (AO spine A1) with 40% height loss anteriorly. Pincer type deformity of T2 with 60% height loss (AO spine A2). Incomplete burst fracture T3 superior endplate with up to D34-534 height loss (AO spine A3). 2 mm of retropulsion of the superior endplate fracture fragments. Atypical widening of the spinous processes of T2-3 and T3-4 some adjacent stranding and thickening, could suggest some underlying disruption of the posterior tension band. Fracture of the right transverse process T3 (AO spine A0). Multiple contiguous posterior 4th through 11th right rib fractures including additional lateral segmental fractures of the fourth through eighth ribs. Contiguous posterior fractures of the left 3rd to 12th ribs with more lateral segmental components of the ninth through eleventh ribs. There is a highly comminuted and displaced fracture of the left scapula extending predominantly through the inferior scapular body extending to the base of the scapular spine and glenoid. There is associated intramuscular contusion in likely hemorrhage within the infraspinatus and subscapularis muscle bellies. CT  ABDOMEN  PELVIS FINDINGS Hepatobiliary: No direct hepatic injury or perihepatic hematoma. No focal liver abnormality is seen. No gallstones, gallbladder wall thickening, or biliary dilatation. Pancreas: No direct pancreatic contusive change. Unremarkable. No pancreatic ductal dilatation or surrounding inflammatory changes. Spleen: No direct splenic injury or perisplenic hematoma. Normal splenic size. No concerning splenic lesion. Adrenals/Urinary Tract: Normal adrenal glands, no evidence of adrenal hematoma or hemorrhage. No direct renal lesion or perinephric hemorrhage. Areas of cortical scarring throughout the right kidney are present. No suspicious renal lesion, urolithiasis or hydronephrosis. No Urinary bladder is largely decompressed at the time of exam and therefore poorly evaluated by CT imaging. No evidence of direct bladder injury. Stomach/Bowel: Motion artifact may limit detection of subtle bowel and mesenteric abnormalities. Distal esophagus, stomach and duodenal sweep are unremarkable. No small bowel wall thickening or dilatation. No evidence of obstruction. Fecalized contents of the distal small bowel may reflect some slowed intestinal transit. The appendix is not well visualized. No colonic dilatation or wall thickening. No direct mesenteric contusion or hemorrhage is evident. Vascular/Lymphatic: No acute aortic injury or dissection is seen. There is a 3.4 cm infrarenal abdominal aortic aneurysm without periaortic stranding or hemorrhage. Atherosclerotic calcifications noted throughout the abdominal aorta and branch vessels. No suspicious or enlarged lymph nodes in the included lymphatic chains. Reproductive: The prostate and seminal vesicles are unremarkable. Other: There is a small volume of intermediate attenuation (27 HU) fluid in the deep pelvis without a clear source of bleeding or hemorrhage. Musculoskeletal: Dedicated lumbar reconstructions were generated and dictated separately. In brief there is a  likely fracture of the left L1 and L2 transverse processes without vertebral body fracture or height loss. The bones of pelvis appear intact and congruent without acute osseous injury. IMPRESSION: Traumatic Findings: 1. No acute intracranial abnormality. 2. No acute cervical fracture or traumatic listhesis. 3. Anterior wedging compression deformity of the T1 vertebral level (AO spine A1) with up to 45% height loss anteriorly. 4. Pincer type deformity of T2 with up to 60% height loss (AO spine A2). 5. Incomplete burst fracture superior endplate T3 (AO spine A3). Up to 2 mm of retropulsion and at most mild canal stenosis. 6. Fracture of the right transverse process T3 (AO spine A0). 7. Widening of the spinous processes of T2-3 and T3-4. Cannot exclude disruption of the posterior tension band. Consider MRI for further assessment. 8. Comminuted and displaced fracture of the left scapula involving the inferior scapular body extending to base of the scapular spine and glenoid. Associated intramuscular contusion/hemorrhage within the infraspinatus and subscapularis. 9. Contiguous fourth through eleventh right rib fractures including segmental fractures the fourth through eighth ribs. Contiguous third through eleventh left rib fractures including segmental fractures of the left ninth through eleventh ribs. Given the extent of the segmental rib fractures assessment for flail chest morphology is recommended. 10. Trace anterior apical right pneumothorax.  Small right effusion. 11. Dependent ground-glass in both lungs may reflect a combination of atelectasis and or minimal pulmonary contusive change. 12. Fractures of the L1 and L2 left transverse processes. 13. Small volume of intermediate attenuation fluid in the abdomen and pelvis without clear source such as direct bowel injury or mesenteric hematoma or contusion though motion artifact may limit detection of subtle bowel and mesenteric anomaly. Recommend close clinical  monitoring and serial abdominal examinations. Nontraumatic Findings: 1. 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 2. Multilevel cervical spondylitic changes as well as additional multilevel degenerative changes in the thoracic and  lumbar spine. 3. Aortic Atherosclerosis (ICD10-I70.0). 4. 3.4 cm infrarenal abdominal aortic aneurysm. Recommend followup by abdomen and pelvis CTA in 6 months, and vascular surgery referral/consultation if not already obtained. This recommendation follows ACR consensus guidelines: White Paper of the ACR Incidental Findings Committee II on Vascular Findings. J Am Coll Radiol 2013; 10:789-794. Aortic aneurysm NOS (ICD10-I71.9) 5. Coronary artery calcifications are present. Please note that the presence of coronary artery calcium documents the presence of coronary artery disease, the severity of this disease and any potential stenosis cannot be assessed on this non-gated CT examination. 6. Cortical scarring seen in the right kidney. These results were called by telephone at the time of interpretation on 04/03/2020 at 10:27 pm to provider ADAM CURATOLO , who verbally acknowledged these results. Electronically Signed: By: Lovena Le M.D. On: 04/03/2020 22:48   CT CHEST W CONTRAST  Addendum Date: 04/03/2020   ADDENDUM REPORT: 04/03/2020 23:05 ADDENDUM: Error identified in the physician notification attestation below -- should read: These results were called by telephone at the time of interpretation on 04/03/2020 at 10:27 pm to provider Dr. Kae Heller of surgery, who verbally acknowledged these results. Electronically Signed   By: Lovena Le M.D.   On: 04/03/2020 23:05   Result Date: 04/03/2020 CLINICAL DATA:  Level 1 trauma: Fell 20 feet from tree. EXAM: CT HEAD WITHOUT CONTRAST CT CERVICAL SPINE WITHOUT CONTRAST CT CHEST, ABDOMEN AND PELVIS WITH CONTRAST TECHNIQUE: Contiguous axial images were obtained from the base of the skull through the vertex  without intravenous contrast. Multidetector CT imaging of the cervical spine was performed without intravenous contrast. Multiplanar CT image reconstructions were also generated. Multidetector CT imaging of the chest, abdomen and pelvis was performed following the standard protocol during bolus administration of intravenous contrast. CONTRAST:  100 cc Omnipaque 350 IV COMPARISON:  Portable chest and pelvic radiograph same day FINDINGS: CT HEAD FINDINGS Brain: Likely remote lacune in the left external capsule/insula. No evidence of acute infarction, hemorrhage, hydrocephalus, extra-axial collection or mass lesion/mass effect. Symmetric prominence of the ventricles, cisterns and sulci compatible with parenchymal volume loss. Patchy areas of white matter hypoattenuation are most compatible with chronic microvascular angiopathy. Vascular: Atherosclerotic calcification of the carotid siphons. No hyperdense vessel. Skull: No focal scalp swelling or hematoma. No calvarial fracture or suspicious osseous lesion. Punctate dermal hyperdensities in the right frontal and left frontal scalp may reflect debris versus benign dermal calcification. Sinuses/Orbits: Minimal pneumatized secretions in the right maxillary sinus. Remaining paranasal sinuses mastoid air cells are predominantly clear. Middle ear cavities are clear. Ossicular chains appear normally configured. Included orbital structures are unremarkable. Other: None CT CERVICAL SPINE FINDINGS Alignment: Preservation of the normal cervical lordosis. Minimal retrolisthesis C3 on C4 and C5 on C6 are likely on a degenerative basis given spondylitic and facet degenerative changes at these levels. No evidence of traumatic listhesis. No abnormally widened, perched or jumped facets. Asymmetric left facet arthropathy is noted C4-C6. Normal alignment of the craniocervical and atlantoaxial articulations. Skull base and vertebrae: No acute fracture of the skull base or cervical  vertebral bodies. Anterior wedging compression deformity of the T1 vertebral level (AO spine A1). Compression deformity of T2 is better detailed on chest CT and dedicated thoracic recon Speer posterior elements are intact. No worrisome osseous lesions. Soft tissues and spinal canal: No pre or paravertebral fluid or swelling. No visible canal hematoma. Disc levels: Multilevel intervertebral disc height loss with spondylitic endplate changes. Disc osteophyte complexes are present throughout the cervical spine with at most  mild canal narrowing at the C5-6 and C6-7 levels secondary to posterior disc osteophyte complexes. CT CHEST FINDINGS Cardiovascular: The aortic root is suboptimally assessed given cardiac pulsation artifact. Calcifications upon the aortic leaflets. Atherosclerotic plaque within the normal caliber aorta. No intramural hematoma, dissection flap or other acute luminal abnormality of the aorta is seen. No periaortic stranding or hemorrhage. Shared origin of the brachiocephalic and left common carotid artery. Tortuosity of the brachiocephalic vasculature with minimal plaque in the proximal great vessels which are otherwise normally opacified. Central pulmonary arteries are normal caliber no large central filling defects though poorly assessed given suboptimal contrast timing for evaluation of the pulmonary arteries. Normal heart size. No pericardial effusion. Coronary artery atherosclerosis is present. Mediastinum/Nodes: No mediastinal fluid or gas. Normal thyroid gland and thoracic inlet. No acute abnormality of the trachea or esophagus. No worrisome mediastinal, hilar or axillary adenopathy. Lungs/Pleura: There is a trace anterior apical right pneumothorax (3/10). Small right pleural effusion posteriorly. No left pneumothorax or effusion. Some ground-glass in the adjacent posterior bilateral lower lobes, right greater than left, may reflect combination of atelectasis and contusive changes with multiple  rib fractures detailed below. No other acute traumatic abnormality of the lung parenchyma. Musculoskeletal: Anterior wedging compression deformity at T1 (AO spine A1) with 40% height loss anteriorly. Pincer type deformity of T2 with 60% height loss (AO spine A2). Incomplete burst fracture T3 superior endplate with up to D34-534 height loss (AO spine A3). 2 mm of retropulsion of the superior endplate fracture fragments. Atypical widening of the spinous processes of T2-3 and T3-4 some adjacent stranding and thickening, could suggest some underlying disruption of the posterior tension band. Fracture of the right transverse process T3 (AO spine A0). Multiple contiguous posterior 4th through 11th right rib fractures including additional lateral segmental fractures of the fourth through eighth ribs. Contiguous posterior fractures of the left 3rd to 12th ribs with more lateral segmental components of the ninth through eleventh ribs. There is a highly comminuted and displaced fracture of the left scapula extending predominantly through the inferior scapular body extending to the base of the scapular spine and glenoid. There is associated intramuscular contusion in likely hemorrhage within the infraspinatus and subscapularis muscle bellies. CT ABDOMEN PELVIS FINDINGS Hepatobiliary: No direct hepatic injury or perihepatic hematoma. No focal liver abnormality is seen. No gallstones, gallbladder wall thickening, or biliary dilatation. Pancreas: No direct pancreatic contusive change. Unremarkable. No pancreatic ductal dilatation or surrounding inflammatory changes. Spleen: No direct splenic injury or perisplenic hematoma. Normal splenic size. No concerning splenic lesion. Adrenals/Urinary Tract: Normal adrenal glands, no evidence of adrenal hematoma or hemorrhage. No direct renal lesion or perinephric hemorrhage. Areas of cortical scarring throughout the right kidney are present. No suspicious renal lesion, urolithiasis or  hydronephrosis. No Urinary bladder is largely decompressed at the time of exam and therefore poorly evaluated by CT imaging. No evidence of direct bladder injury. Stomach/Bowel: Motion artifact may limit detection of subtle bowel and mesenteric abnormalities. Distal esophagus, stomach and duodenal sweep are unremarkable. No small bowel wall thickening or dilatation. No evidence of obstruction. Fecalized contents of the distal small bowel may reflect some slowed intestinal transit. The appendix is not well visualized. No colonic dilatation or wall thickening. No direct mesenteric contusion or hemorrhage is evident. Vascular/Lymphatic: No acute aortic injury or dissection is seen. There is a 3.4 cm infrarenal abdominal aortic aneurysm without periaortic stranding or hemorrhage. Atherosclerotic calcifications noted throughout the abdominal aorta and branch vessels. No suspicious or enlarged lymph nodes  in the included lymphatic chains. Reproductive: The prostate and seminal vesicles are unremarkable. Other: There is a small volume of intermediate attenuation (27 HU) fluid in the deep pelvis without a clear source of bleeding or hemorrhage. Musculoskeletal: Dedicated lumbar reconstructions were generated and dictated separately. In brief there is a likely fracture of the left L1 and L2 transverse processes without vertebral body fracture or height loss. The bones of pelvis appear intact and congruent without acute osseous injury. IMPRESSION: Traumatic Findings: 1. No acute intracranial abnormality. 2. No acute cervical fracture or traumatic listhesis. 3. Anterior wedging compression deformity of the T1 vertebral level (AO spine A1) with up to 45% height loss anteriorly. 4. Pincer type deformity of T2 with up to 60% height loss (AO spine A2). 5. Incomplete burst fracture superior endplate T3 (AO spine A3). Up to 2 mm of retropulsion and at most mild canal stenosis. 6. Fracture of the right transverse process T3 (AO spine  A0). 7. Widening of the spinous processes of T2-3 and T3-4. Cannot exclude disruption of the posterior tension band. Consider MRI for further assessment. 8. Comminuted and displaced fracture of the left scapula involving the inferior scapular body extending to base of the scapular spine and glenoid. Associated intramuscular contusion/hemorrhage within the infraspinatus and subscapularis. 9. Contiguous fourth through eleventh right rib fractures including segmental fractures the fourth through eighth ribs. Contiguous third through eleventh left rib fractures including segmental fractures of the left ninth through eleventh ribs. Given the extent of the segmental rib fractures assessment for flail chest morphology is recommended. 10. Trace anterior apical right pneumothorax.  Small right effusion. 11. Dependent ground-glass in both lungs may reflect a combination of atelectasis and or minimal pulmonary contusive change. 12. Fractures of the L1 and L2 left transverse processes. 13. Small volume of intermediate attenuation fluid in the abdomen and pelvis without clear source such as direct bowel injury or mesenteric hematoma or contusion though motion artifact may limit detection of subtle bowel and mesenteric anomaly. Recommend close clinical monitoring and serial abdominal examinations. Nontraumatic Findings: 1. 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 2. Multilevel cervical spondylitic changes as well as additional multilevel degenerative changes in the thoracic and lumbar spine. 3. Aortic Atherosclerosis (ICD10-I70.0). 4. 3.4 cm infrarenal abdominal aortic aneurysm. Recommend followup by abdomen and pelvis CTA in 6 months, and vascular surgery referral/consultation if not already obtained. This recommendation follows ACR consensus guidelines: White Paper of the ACR Incidental Findings Committee II on Vascular Findings. J Am Coll Radiol 2013; 10:789-794. Aortic aneurysm NOS  (ICD10-I71.9) 5. Coronary artery calcifications are present. Please note that the presence of coronary artery calcium documents the presence of coronary artery disease, the severity of this disease and any potential stenosis cannot be assessed on this non-gated CT examination. 6. Cortical scarring seen in the right kidney. These results were called by telephone at the time of interpretation on 04/03/2020 at 10:27 pm to provider ADAM CURATOLO , who verbally acknowledged these results. Electronically Signed: By: Lovena Le M.D. On: 04/03/2020 22:48   CT Cervical Spine Wo Contrast  Addendum Date: 04/03/2020   ADDENDUM REPORT: 04/03/2020 23:05 ADDENDUM: Error identified in the physician notification attestation below -- should read: These results were called by telephone at the time of interpretation on 04/03/2020 at 10:27 pm to provider Dr. Kae Heller of surgery, who verbally acknowledged these results. Electronically Signed   By: Lovena Le M.D.   On: 04/03/2020 23:05   Result Date: 04/03/2020 CLINICAL DATA:  Level 1 trauma: Fell 20 feet from tree. EXAM: CT HEAD WITHOUT CONTRAST CT CERVICAL SPINE WITHOUT CONTRAST CT CHEST, ABDOMEN AND PELVIS WITH CONTRAST TECHNIQUE: Contiguous axial images were obtained from the base of the skull through the vertex without intravenous contrast. Multidetector CT imaging of the cervical spine was performed without intravenous contrast. Multiplanar CT image reconstructions were also generated. Multidetector CT imaging of the chest, abdomen and pelvis was performed following the standard protocol during bolus administration of intravenous contrast. CONTRAST:  100 cc Omnipaque 350 IV COMPARISON:  Portable chest and pelvic radiograph same day FINDINGS: CT HEAD FINDINGS Brain: Likely remote lacune in the left external capsule/insula. No evidence of acute infarction, hemorrhage, hydrocephalus, extra-axial collection or mass lesion/mass effect. Symmetric prominence of the ventricles,  cisterns and sulci compatible with parenchymal volume loss. Patchy areas of white matter hypoattenuation are most compatible with chronic microvascular angiopathy. Vascular: Atherosclerotic calcification of the carotid siphons. No hyperdense vessel. Skull: No focal scalp swelling or hematoma. No calvarial fracture or suspicious osseous lesion. Punctate dermal hyperdensities in the right frontal and left frontal scalp may reflect debris versus benign dermal calcification. Sinuses/Orbits: Minimal pneumatized secretions in the right maxillary sinus. Remaining paranasal sinuses mastoid air cells are predominantly clear. Middle ear cavities are clear. Ossicular chains appear normally configured. Included orbital structures are unremarkable. Other: None CT CERVICAL SPINE FINDINGS Alignment: Preservation of the normal cervical lordosis. Minimal retrolisthesis C3 on C4 and C5 on C6 are likely on a degenerative basis given spondylitic and facet degenerative changes at these levels. No evidence of traumatic listhesis. No abnormally widened, perched or jumped facets. Asymmetric left facet arthropathy is noted C4-C6. Normal alignment of the craniocervical and atlantoaxial articulations. Skull base and vertebrae: No acute fracture of the skull base or cervical vertebral bodies. Anterior wedging compression deformity of the T1 vertebral level (AO spine A1). Compression deformity of T2 is better detailed on chest CT and dedicated thoracic recon Speer posterior elements are intact. No worrisome osseous lesions. Soft tissues and spinal canal: No pre or paravertebral fluid or swelling. No visible canal hematoma. Disc levels: Multilevel intervertebral disc height loss with spondylitic endplate changes. Disc osteophyte complexes are present throughout the cervical spine with at most mild canal narrowing at the C5-6 and C6-7 levels secondary to posterior disc osteophyte complexes. CT CHEST FINDINGS Cardiovascular: The aortic root is  suboptimally assessed given cardiac pulsation artifact. Calcifications upon the aortic leaflets. Atherosclerotic plaque within the normal caliber aorta. No intramural hematoma, dissection flap or other acute luminal abnormality of the aorta is seen. No periaortic stranding or hemorrhage. Shared origin of the brachiocephalic and left common carotid artery. Tortuosity of the brachiocephalic vasculature with minimal plaque in the proximal great vessels which are otherwise normally opacified. Central pulmonary arteries are normal caliber no large central filling defects though poorly assessed given suboptimal contrast timing for evaluation of the pulmonary arteries. Normal heart size. No pericardial effusion. Coronary artery atherosclerosis is present. Mediastinum/Nodes: No mediastinal fluid or gas. Normal thyroid gland and thoracic inlet. No acute abnormality of the trachea or esophagus. No worrisome mediastinal, hilar or axillary adenopathy. Lungs/Pleura: There is a trace anterior apical right pneumothorax (3/10). Small right pleural effusion posteriorly. No left pneumothorax or effusion. Some ground-glass in the adjacent posterior bilateral lower lobes, right greater than left, may reflect combination of atelectasis and contusive changes with multiple rib fractures detailed below. No other acute traumatic abnormality of the lung parenchyma. Musculoskeletal: Anterior wedging compression deformity at T1 (AO spine A1) with 40%  height loss anteriorly. Pincer type deformity of T2 with 60% height loss (AO spine A2). Incomplete burst fracture T3 superior endplate with up to [id number] height loss (AO spine A3). 2 mm of retropulsion of the superior endplate fracture fragments. Atypical widening of the spinous processes of T2-3 and T3-4 some adjacent stranding and thickening, could suggest some underlying disruption of the posterior tension band. Fracture of the right transverse process T3 (AO spine A0). Multiple contiguous  posterior 4th through 11th right rib fractures including additional lateral segmental fractures of the fourth through eighth ribs. Contiguous posterior fractures of the left 3rd to 12th ribs with more lateral segmental components of the ninth through eleventh ribs. There is a highly comminuted and displaced fracture of the left scapula extending predominantly through the inferior scapular body extending to the base of the scapular spine and glenoid. There is associated intramuscular contusion in likely hemorrhage within the infraspinatus and subscapularis muscle bellies. CT ABDOMEN PELVIS FINDINGS Hepatobiliary: No direct hepatic injury or perihepatic hematoma. No focal liver abnormality is seen. No gallstones, gallbladder wall thickening, or biliary dilatation. Pancreas: No direct pancreatic contusive change. Unremarkable. No pancreatic ductal dilatation or surrounding inflammatory changes. Spleen: No direct splenic injury or perisplenic hematoma. Normal splenic size. No concerning splenic lesion. Adrenals/Urinary Tract: Normal adrenal glands, no evidence of adrenal hematoma or hemorrhage. No direct renal lesion or perinephric hemorrhage. Areas of cortical scarring throughout the right kidney are present. No suspicious renal lesion, urolithiasis or hydronephrosis. No Urinary bladder is largely decompressed at the time of exam and therefore poorly evaluated by CT imaging. No evidence of direct bladder injury. Stomach/Bowel: Motion artifact may limit detection of subtle bowel and mesenteric abnormalities. Distal esophagus, stomach and duodenal sweep are unremarkable. No small bowel wall thickening or dilatation. No evidence of obstruction. Fecalized contents of the distal small bowel may reflect some slowed intestinal transit. The appendix is not well visualized. No colonic dilatation or wall thickening. No direct mesenteric contusion or hemorrhage is evident. Vascular/Lymphatic: No acute aortic injury or dissection  is seen. There is a 3.4 cm infrarenal abdominal aortic aneurysm without periaortic stranding or hemorrhage. Atherosclerotic calcifications noted throughout the abdominal aorta and branch vessels. No suspicious or enlarged lymph nodes in the included lymphatic chains. Reproductive: The prostate and seminal vesicles are unremarkable. Other: There is a small volume of intermediate attenuation (27 HU) fluid in the deep pelvis without a clear source of bleeding or hemorrhage. Musculoskeletal: Dedicated lumbar reconstructions were generated and dictated separately. In brief there is a likely fracture of the left L1 and L2 transverse processes without vertebral body fracture or height loss. The bones of pelvis appear intact and congruent without acute osseous injury. IMPRESSION: Traumatic Findings: 1. No acute intracranial abnormality. 2. No acute cervical fracture or traumatic listhesis. 3. Anterior wedging compression deformity of the T1 vertebral level (AO spine A1) with up to 45% height loss anteriorly. 4. Pincer type deformity of T2 with up to 60% height loss (AO spine A2). 5. Incomplete burst fracture superior endplate T3 (AO spine A3). Up to 2 mm of retropulsion and at most mild canal stenosis. 6. Fracture of the right transverse process T3 (AO spine A0). 7. Widening of the spinous processes of T2-3 and T3-4. Cannot exclude disruption of the posterior tension band. Consider MRI for further assessment. 8. Comminuted and displaced fracture of the left scapula involving the inferior scapular body extending to base of the scapular spine and glenoid. Associated intramuscular contusion/hemorrhage within the infraspinatus and subscapularis. 9.  Contiguous fourth through eleventh right rib fractures including segmental fractures the fourth through eighth ribs. Contiguous third through eleventh left rib fractures including segmental fractures of the left ninth through eleventh ribs. Given the extent of the segmental rib  fractures assessment for flail chest morphology is recommended. 10. Trace anterior apical right pneumothorax.  Small right effusion. 11. Dependent ground-glass in both lungs may reflect a combination of atelectasis and or minimal pulmonary contusive change. 12. Fractures of the L1 and L2 left transverse processes. 13. Small volume of intermediate attenuation fluid in the abdomen and pelvis without clear source such as direct bowel injury or mesenteric hematoma or contusion though motion artifact may limit detection of subtle bowel and mesenteric anomaly. Recommend close clinical monitoring and serial abdominal examinations. Nontraumatic Findings: 1. 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 2. Multilevel cervical spondylitic changes as well as additional multilevel degenerative changes in the thoracic and lumbar spine. 3. Aortic Atherosclerosis (ICD10-I70.0). 4. 3.4 cm infrarenal abdominal aortic aneurysm. Recommend followup by abdomen and pelvis CTA in 6 months, and vascular surgery referral/consultation if not already obtained. This recommendation follows ACR consensus guidelines: White Paper of the ACR Incidental Findings Committee II on Vascular Findings. J Am Coll Radiol 2013; 10:789-794. Aortic aneurysm NOS (ICD10-I71.9) 5. Coronary artery calcifications are present. Please note that the presence of coronary artery calcium documents the presence of coronary artery disease, the severity of this disease and any potential stenosis cannot be assessed on this non-gated CT examination. 6. Cortical scarring seen in the right kidney. These results were called by telephone at the time of interpretation on 04/03/2020 at 10:27 pm to provider ADAM CURATOLO , who verbally acknowledged these results. Electronically Signed: By: Lovena Le M.D. On: 04/03/2020 22:48   CT ABDOMEN PELVIS W CONTRAST  Addendum Date: 04/03/2020   ADDENDUM REPORT: 04/03/2020 23:05 ADDENDUM: Error  identified in the physician notification attestation below -- should read: These results were called by telephone at the time of interpretation on 04/03/2020 at 10:27 pm to provider Dr. Kae Heller of surgery, who verbally acknowledged these results. Electronically Signed   By: Lovena Le M.D.   On: 04/03/2020 23:05   Result Date: 04/03/2020 CLINICAL DATA:  Level 1 trauma: Fell 20 feet from tree. EXAM: CT HEAD WITHOUT CONTRAST CT CERVICAL SPINE WITHOUT CONTRAST CT CHEST, ABDOMEN AND PELVIS WITH CONTRAST TECHNIQUE: Contiguous axial images were obtained from the base of the skull through the vertex without intravenous contrast. Multidetector CT imaging of the cervical spine was performed without intravenous contrast. Multiplanar CT image reconstructions were also generated. Multidetector CT imaging of the chest, abdomen and pelvis was performed following the standard protocol during bolus administration of intravenous contrast. CONTRAST:  100 cc Omnipaque 350 IV COMPARISON:  Portable chest and pelvic radiograph same day FINDINGS: CT HEAD FINDINGS Brain: Likely remote lacune in the left external capsule/insula. No evidence of acute infarction, hemorrhage, hydrocephalus, extra-axial collection or mass lesion/mass effect. Symmetric prominence of the ventricles, cisterns and sulci compatible with parenchymal volume loss. Patchy areas of white matter hypoattenuation are most compatible with chronic microvascular angiopathy. Vascular: Atherosclerotic calcification of the carotid siphons. No hyperdense vessel. Skull: No focal scalp swelling or hematoma. No calvarial fracture or suspicious osseous lesion. Punctate dermal hyperdensities in the right frontal and left frontal scalp may reflect debris versus benign dermal calcification. Sinuses/Orbits: Minimal pneumatized secretions in the right maxillary sinus. Remaining paranasal sinuses mastoid air cells are predominantly clear. Middle ear cavities are clear. Ossicular chains  appear normally configured. Included orbital structures are unremarkable. Other: None CT CERVICAL SPINE FINDINGS Alignment: Preservation of the normal cervical lordosis. Minimal retrolisthesis C3 on C4 and C5 on C6 are likely on a degenerative basis given spondylitic and facet degenerative changes at these levels. No evidence of traumatic listhesis. No abnormally widened, perched or jumped facets. Asymmetric left facet arthropathy is noted C4-C6. Normal alignment of the craniocervical and atlantoaxial articulations. Skull base and vertebrae: No acute fracture of the skull base or cervical vertebral bodies. Anterior wedging compression deformity of the T1 vertebral level (AO spine A1). Compression deformity of T2 is better detailed on chest CT and dedicated thoracic recon Speer posterior elements are intact. No worrisome osseous lesions. Soft tissues and spinal canal: No pre or paravertebral fluid or swelling. No visible canal hematoma. Disc levels: Multilevel intervertebral disc height loss with spondylitic endplate changes. Disc osteophyte complexes are present throughout the cervical spine with at most mild canal narrowing at the C5-6 and C6-7 levels secondary to posterior disc osteophyte complexes. CT CHEST FINDINGS Cardiovascular: The aortic root is suboptimally assessed given cardiac pulsation artifact. Calcifications upon the aortic leaflets. Atherosclerotic plaque within the normal caliber aorta. No intramural hematoma, dissection flap or other acute luminal abnormality of the aorta is seen. No periaortic stranding or hemorrhage. Shared origin of the brachiocephalic and left common carotid artery. Tortuosity of the brachiocephalic vasculature with minimal plaque in the proximal great vessels which are otherwise normally opacified. Central pulmonary arteries are normal caliber no large central filling defects though poorly assessed given suboptimal contrast timing for evaluation of the pulmonary arteries.  Normal heart size. No pericardial effusion. Coronary artery atherosclerosis is present. Mediastinum/Nodes: No mediastinal fluid or gas. Normal thyroid gland and thoracic inlet. No acute abnormality of the trachea or esophagus. No worrisome mediastinal, hilar or axillary adenopathy. Lungs/Pleura: There is a trace anterior apical right pneumothorax (3/10). Small right pleural effusion posteriorly. No left pneumothorax or effusion. Some ground-glass in the adjacent posterior bilateral lower lobes, right greater than left, may reflect combination of atelectasis and contusive changes with multiple rib fractures detailed below. No other acute traumatic abnormality of the lung parenchyma. Musculoskeletal: Anterior wedging compression deformity at T1 (AO spine A1) with 40% height loss anteriorly. Pincer type deformity of T2 with 60% height loss (AO spine A2). Incomplete burst fracture T3 superior endplate with up to D34-534 height loss (AO spine A3). 2 mm of retropulsion of the superior endplate fracture fragments. Atypical widening of the spinous processes of T2-3 and T3-4 some adjacent stranding and thickening, could suggest some underlying disruption of the posterior tension band. Fracture of the right transverse process T3 (AO spine A0). Multiple contiguous posterior 4th through 11th right rib fractures including additional lateral segmental fractures of the fourth through eighth ribs. Contiguous posterior fractures of the left 3rd to 12th ribs with more lateral segmental components of the ninth through eleventh ribs. There is a highly comminuted and displaced fracture of the left scapula extending predominantly through the inferior scapular body extending to the base of the scapular spine and glenoid. There is associated intramuscular contusion in likely hemorrhage within the infraspinatus and subscapularis muscle bellies. CT ABDOMEN PELVIS FINDINGS Hepatobiliary: No direct hepatic injury or perihepatic hematoma. No focal  liver abnormality is seen. No gallstones, gallbladder wall thickening, or biliary dilatation. Pancreas: No direct pancreatic contusive change. Unremarkable. No pancreatic ductal dilatation or surrounding inflammatory changes. Spleen: No direct splenic injury or perisplenic hematoma. Normal splenic size. No concerning splenic lesion. Adrenals/Urinary Tract:  Normal adrenal glands, no evidence of adrenal hematoma or hemorrhage. No direct renal lesion or perinephric hemorrhage. Areas of cortical scarring throughout the right kidney are present. No suspicious renal lesion, urolithiasis or hydronephrosis. No Urinary bladder is largely decompressed at the time of exam and therefore poorly evaluated by CT imaging. No evidence of direct bladder injury. Stomach/Bowel: Motion artifact may limit detection of subtle bowel and mesenteric abnormalities. Distal esophagus, stomach and duodenal sweep are unremarkable. No small bowel wall thickening or dilatation. No evidence of obstruction. Fecalized contents of the distal small bowel may reflect some slowed intestinal transit. The appendix is not well visualized. No colonic dilatation or wall thickening. No direct mesenteric contusion or hemorrhage is evident. Vascular/Lymphatic: No acute aortic injury or dissection is seen. There is a 3.4 cm infrarenal abdominal aortic aneurysm without periaortic stranding or hemorrhage. Atherosclerotic calcifications noted throughout the abdominal aorta and branch vessels. No suspicious or enlarged lymph nodes in the included lymphatic chains. Reproductive: The prostate and seminal vesicles are unremarkable. Other: There is a small volume of intermediate attenuation (27 HU) fluid in the deep pelvis without a clear source of bleeding or hemorrhage. Musculoskeletal: Dedicated lumbar reconstructions were generated and dictated separately. In brief there is a likely fracture of the left L1 and L2 transverse processes without vertebral body fracture or  height loss. The bones of pelvis appear intact and congruent without acute osseous injury. IMPRESSION: Traumatic Findings: 1. No acute intracranial abnormality. 2. No acute cervical fracture or traumatic listhesis. 3. Anterior wedging compression deformity of the T1 vertebral level (AO spine A1) with up to 45% height loss anteriorly. 4. Pincer type deformity of T2 with up to 60% height loss (AO spine A2). 5. Incomplete burst fracture superior endplate T3 (AO spine A3). Up to 2 mm of retropulsion and at most mild canal stenosis. 6. Fracture of the right transverse process T3 (AO spine A0). 7. Widening of the spinous processes of T2-3 and T3-4. Cannot exclude disruption of the posterior tension band. Consider MRI for further assessment. 8. Comminuted and displaced fracture of the left scapula involving the inferior scapular body extending to base of the scapular spine and glenoid. Associated intramuscular contusion/hemorrhage within the infraspinatus and subscapularis. 9. Contiguous fourth through eleventh right rib fractures including segmental fractures the fourth through eighth ribs. Contiguous third through eleventh left rib fractures including segmental fractures of the left ninth through eleventh ribs. Given the extent of the segmental rib fractures assessment for flail chest morphology is recommended. 10. Trace anterior apical right pneumothorax.  Small right effusion. 11. Dependent ground-glass in both lungs may reflect a combination of atelectasis and or minimal pulmonary contusive change. 12. Fractures of the L1 and L2 left transverse processes. 13. Small volume of intermediate attenuation fluid in the abdomen and pelvis without clear source such as direct bowel injury or mesenteric hematoma or contusion though motion artifact may limit detection of subtle bowel and mesenteric anomaly. Recommend close clinical monitoring and serial abdominal examinations. Nontraumatic Findings: 1. Chronic microvascular  angiopathy and mild parenchymal volume loss within the brain. 2. Multilevel cervical spondylitic changes as well as additional multilevel degenerative changes in the thoracic and lumbar spine. 3. Aortic Atherosclerosis (ICD10-I70.0). 4. 3.4 cm infrarenal abdominal aortic aneurysm. Recommend followup by abdomen and pelvis CTA in 6 months, and vascular surgery referral/consultation if not already obtained. This recommendation follows ACR consensus guidelines: White Paper of the ACR Incidental Findings Committee II on Vascular Findings. J Am Coll Radiol 2013; 10:789-794. Aortic aneurysm NOS (  ICD10-I71.9) 5. Coronary artery calcifications are present. Please note that the presence of coronary artery calcium documents the presence of coronary artery disease, the severity of this disease and any potential stenosis cannot be assessed on this non-gated CT examination. 6. Cortical scarring seen in the right kidney. These results were called by telephone at the time of interpretation on 04/03/2020 at 10:27 pm to provider ADAM CURATOLO , who verbally acknowledged these results. Electronically Signed: By: Lovena Le M.D. On: 04/03/2020 22:48   DG Pelvis Portable  Result Date: 04/03/2020 CLINICAL DATA:  [place] out of tree, pain EXAM: PORTABLE PELVIS 1-2 VIEWS COMPARISON:  None. FINDINGS: Supine frontal view of the pelvis including both hips demonstrates no displaced fractures. Alignment is anatomic. Soft tissues are unremarkable. IMPRESSION: 1. No acute pelvic fracture. Electronically Signed   By: Randa Ngo M.D.   On: 04/03/2020 21:52   CT T-SPINE NO CHARGE  Result Date: 04/03/2020 CLINICAL DATA:  Level 1 trauma: Fell 20 feet from tree EXAM: CT THORACIC, AND LUMBAR SPINE WITHOUT CONTRAST TECHNIQUE: Multiplanar CT reconstructions of the thoracic and lumbar spine were generated from contemporary CT of the chest, abdomen and pelvis in the setting of trauma. COMPARISON:  Contemporary CT of the chest, abdomen and pelvis,  radiograph of the chest and pelvis FINDINGS: CT THORACIC SPINE FINDINGS Alignment: Mild straightening of the normal lower thoracic kyphosis with slight exaggeration superiorly. Mild atypical widening of the T2-3 and T3-4 spinous processes beyond what is expected for the kyphotic curvature. Vertebrae: Multiple osseous injuries include: *Anterior wedging compression deformity at T1 with 40% height loss anteriorly (AOSpine A1). *Pincer type deformity of T2 with 60% height loss (AOSpine A2). *Incomplete burst fracture T3 superior endplate with up to D34-534 height loss (AO spine A3). 2 mm of retropulsion of the superior endplate fracture fragments. *Fracture of the right transverse process T3 (AO spine A0). *Thin sclerotic bands seen in the superior endplate T5 and T8 and extending from the inferior plate of T6 are indeterminate but could reflect subcortical impaction fracture in the setting of significant trauma. Would be better assessed with MR. *Multiple contiguous bilateral rib fractures better detailed on dedicated CT of the chest from which this study is reconstructed. Paraspinal and other soft tissues: The thickening and likely trace hemorrhage adjacent the T1-T3 compression deformities. No other paraspinal fluid or swelling. No visible canal hematoma. Slight bowing of the supraspinous ligament T2-3. Some adjacent soft tissue stranding though this may be related to posterior contusive changes in a comminuted left scapular fracture. Disc levels: Retropulsion of fracture fragments at T3 result in some minimal effacement of the ventral thecal sac without significant canal stenosis. No other significant narrowing or foraminal stenosis is seen within the thoracic spine. CT LUMBAR SPINE FINDINGS Segmentation: 5 lumbar type vertebrae are present. Alignment: Slight straightening of the normal lumbar lordosis. Mild retrolisthesis L3 on 4 of approximately 2 mm may be degenerative with some early discogenic change at this  level. No associated spondylolysis. Vertebrae: Minimally displaced fractures of the left T1 and T2 transverse processes. No vertebral body fracture or height loss is evident. Multilevel discogenic changes as detailed below.Included portions of the bony pelvis are intact. Paraspinal and other soft tissues: Small amount of thickening in stranding adjacent the left T1 and T2 transverse processes. No over paraspinal thickening swelling or hemorrhage. Some mild contusive changes noted posteriorly. For additional findings in the abdomen and pelvis, please see dedicated CT chest, abdomen and pelvis from which this study is reconstructed. Disc  levels: Multilevel intervertebral disc height loss is present with some mild global disc bulging most pronounced C3-4 and C4-5 where there is resulting mild canal stenosis. A shallow calcified central disc protrusion is noted at L5-S1 without significant resulting stenosis. Aforementioned disc bulges and mild facet degenerative changes result in mild bilateral foraminal narrowing C3-4 and C4-5. IMPRESSION: CT THORACIC SPINE 1. Anterior wedging compression deformity at T1 with 40% height loss anteriorly (AOSpine A1). 2. Pincer type deformity of T2 with 60% height loss (AOSpine A2). 3. Incomplete burst fracture T3 superior endplate with up to D34-534 height loss (AOSpine A3) and of the right transverse process T3 (AOSpine A0). 4. Widening of the interspinous distances T2-3 and T3-4 slightly greater than expected for the degree of kyphosis. Given the vertebral body fractures at these levels, involvement of the posterior tension band is not fully excluded. Consider further evaluation with MRI. 5. Thin sclerotic bands in the superior endplate T5 and T8 and extending from the inferior plate of T6 are indeterminate but could reflect subcortical impaction fracture in the setting of significant trauma. CT LUMBAR SPINE 1. Nondisplaced fractures of the left L1 and L2 transverse processes. 2. No  acute vertebral body fracture or vertebral body height loss identified. 3. Multilevel discogenic changes most pronounced C3-4 and C4-5 with resulting mild canal stenosis. 4. Mild retrolisthesis L3 on 4 may be degenerative with some early discogenic change at this level. For additional findings within the chest, abdomen and pelvis, please see dedicated CT from which this study is reconstructed. These results were called by telephone at the time of interpretation on 04/03/2020 at 10: 27 pm to provider MD Kae Heller of surgery , who verbally acknowledged these results. Electronically Signed   By: Lovena Le M.D.   On: 04/03/2020 23:04   CT L-SPINE NO CHARGE  Result Date: 04/03/2020 CLINICAL DATA:  Level 1 trauma: Fell 20 feet from tree EXAM: CT THORACIC, AND LUMBAR SPINE WITHOUT CONTRAST TECHNIQUE: Multiplanar CT reconstructions of the thoracic and lumbar spine were generated from contemporary CT of the chest, abdomen and pelvis in the setting of trauma. COMPARISON:  Contemporary CT of the chest, abdomen and pelvis, radiograph of the chest and pelvis FINDINGS: CT THORACIC SPINE FINDINGS Alignment: Mild straightening of the normal lower thoracic kyphosis with slight exaggeration superiorly. Mild atypical widening of the T2-3 and T3-4 spinous processes beyond what is expected for the kyphotic curvature. Vertebrae: Multiple osseous injuries include: *Anterior wedging compression deformity at T1 with 40% height loss anteriorly (AOSpine A1). *Pincer type deformity of T2 with 60% height loss (AOSpine A2). *Incomplete burst fracture T3 superior endplate with up to D34-534 height loss (AO spine A3). 2 mm of retropulsion of the superior endplate fracture fragments. *Fracture of the right transverse process T3 (AO spine A0). *Thin sclerotic bands seen in the superior endplate T5 and T8 and extending from the inferior plate of T6 are indeterminate but could reflect subcortical impaction fracture in the setting of significant trauma.  Would be better assessed with MR. *Multiple contiguous bilateral rib fractures better detailed on dedicated CT of the chest from which this study is reconstructed. Paraspinal and other soft tissues: The thickening and likely trace hemorrhage adjacent the T1-T3 compression deformities. No other paraspinal fluid or swelling. No visible canal hematoma. Slight bowing of the supraspinous ligament T2-3. Some adjacent soft tissue stranding though this may be related to posterior contusive changes in a comminuted left scapular fracture. Disc levels: Retropulsion of fracture fragments at T3 result in some minimal  effacement of the ventral thecal sac without significant canal stenosis. No other significant narrowing or foraminal stenosis is seen within the thoracic spine. CT LUMBAR SPINE FINDINGS Segmentation: 5 lumbar type vertebrae are present. Alignment: Slight straightening of the normal lumbar lordosis. Mild retrolisthesis L3 on 4 of approximately 2 mm may be degenerative with some early discogenic change at this level. No associated spondylolysis. Vertebrae: Minimally displaced fractures of the left T1 and T2 transverse processes. No vertebral body fracture or height loss is evident. Multilevel discogenic changes as detailed below.Included portions of the bony pelvis are intact. Paraspinal and other soft tissues: Small amount of thickening in stranding adjacent the left T1 and T2 transverse processes. No over paraspinal thickening swelling or hemorrhage. Some mild contusive changes noted posteriorly. For additional findings in the abdomen and pelvis, please see dedicated CT chest, abdomen and pelvis from which this study is reconstructed. Disc levels: Multilevel intervertebral disc height loss is present with some mild global disc bulging most pronounced C3-4 and C4-5 where there is resulting mild canal stenosis. A shallow calcified central disc protrusion is noted at L5-S1 without significant resulting stenosis.  Aforementioned disc bulges and mild facet degenerative changes result in mild bilateral foraminal narrowing C3-4 and C4-5. IMPRESSION: CT THORACIC SPINE 1. Anterior wedging compression deformity at T1 with 40% height loss anteriorly (AOSpine A1). 2. Pincer type deformity of T2 with 60% height loss (AOSpine A2). 3. Incomplete burst fracture T3 superior endplate with up to D34-534 height loss (AOSpine A3) and of the right transverse process T3 (AOSpine A0). 4. Widening of the interspinous distances T2-3 and T3-4 slightly greater than expected for the degree of kyphosis. Given the vertebral body fractures at these levels, involvement of the posterior tension band is not fully excluded. Consider further evaluation with MRI. 5. Thin sclerotic bands in the superior endplate T5 and T8 and extending from the inferior plate of T6 are indeterminate but could reflect subcortical impaction fracture in the setting of significant trauma. CT LUMBAR SPINE 1. Nondisplaced fractures of the left L1 and L2 transverse processes. 2. No acute vertebral body fracture or vertebral body height loss identified. 3. Multilevel discogenic changes most pronounced C3-4 and C4-5 with resulting mild canal stenosis. 4. Mild retrolisthesis L3 on 4 may be degenerative with some early discogenic change at this level. For additional findings within the chest, abdomen and pelvis, please see dedicated CT from which this study is reconstructed. These results were called by telephone at the time of interpretation on 04/03/2020 at 10: 27 pm to provider MD Kae Heller of surgery , who verbally acknowledged these results. Electronically Signed   By: Lovena Le M.D.   On: 04/03/2020 23:04   DG Chest Port 1 View  Result Date: 04/04/2020 CLINICAL DATA:  Rib fractures. EXAM: PORTABLE CHEST 1 VIEW COMPARISON:  Chest CT from yesterday FINDINGS: Normal heart size and mediastinal contours. Curvilinear structure along the left descending aorta is presumably atelectasis when  compared with recent chest CT. Rib fractures are subtle by radiography. No visible effusion or pneumothorax. IMPRESSION: Mild atelectatic change.  No visible air leak. Electronically Signed   By: Monte Fantasia M.D.   On: 04/04/2020 08:05   DG Chest Port 1 View  Result Date: 04/03/2020 CLINICAL DATA:  [place] 20 feet, short of breath, left-sided chest pain EXAM: PORTABLE CHEST 1 VIEW COMPARISON:  None. FINDINGS: Single frontal view of the chest demonstrates an unremarkable cardiac silhouette. No airspace disease, effusion, or pneumothorax on this supine evaluation. There is a  minimally displaced right lateral seventh rib fracture. There is lucency through the infraspinous region of the scapula which may reflect fracture. IMPRESSION: 1. Suspected left scapular fracture. 2. Minimally displaced right lateral seventh rib fracture. 3. Otherwise no acute intrathoracic process. Electronically Signed   By: Randa Ngo M.D.   On: 04/03/2020 21:50    Review of Systems Blood pressure 122/80, pulse 75, temperature 97.8 F (36.6 C), temperature source Oral, resp. rate 13, height 6' (1.829 m), weight 75.8 kg, SpO2 94 %. Physical Exam  Constitutional: He is oriented to person, place, and time. He appears well-developed and well-nourished. No distress.  HENT:  Head: Normocephalic.  Eyes: Pupils are equal, round, and reactive to light. Conjunctivae and EOM are normal.  Neck:  In cervical collar  Cardiovascular: Normal rate.  Respiratory: Effort normal and breath sounds normal.  GI: Soft. Bowel sounds are normal.  Neurological: He is alert and oriented to person, place, and time. He has normal reflexes. He displays normal reflexes. No cranial nerve deficit. He exhibits normal muscle tone. Coordination normal.  Skin: Skin is warm and dry.  Psychiatric: He has a normal mood and affect. His behavior is normal.    Assessment/Plan: Jordan [name] has had traumatic fractures of the upper three thoracic vertebrae.  It is very difficult to brace this area, and for now he should continue to wear the cervical collar. Unlikely he will need operative stabilization, posterior elements are all intact. I will follow while in the hospital, and after discharge. If collar is not on there is very little danger, but I believe it will help mitigate his pain.   Ashok Pall 04/04/2020, 1:54 PM

## 2020-04-04 NOTE — Evaluation (Signed)
Occupational Therapy Evaluation Patient Details Name: Jordan [name] MRN: [id number] DOB: 03-Jun-1956 Today's Date: 04/04/2020    History of Present Illness 64yo male who sustained a 20' fall from tree at work. He reports shallow breathing since the incident, but developed worsening SOB, chest pain and shoulder pain. Reported pt was originally walking around but became dizzy and lethargic en route to hospital. Pt with hx of smoking crack cocaine. Pt found to have T1 wedge fx, T2 pincer type deformity, T3 superior endplate incomplete burst fx with 23mm retropulsion and mild canal stenosis, R T3 TP fx; L1-2 L TP fx. NSG recommends C-collar, no surgery.   Clinical Impression   PTA pt independent and working, living with mother. At time of eval, pt completing bed mobility at mod A and sit <> stand with min A. Pt completed transfer to recliner with max safety cues. Pt insisting on standing despite feeling dizzy and faint. Also noted cognitive deficits in safety, awareness, and problem solving. Began education on spinal precautions, brace wear and care, and sling management/UE ROM restrictions. Noted pt to have chest congestion, limited in coughing due to rib pain. Educated on IS usage, splinting for pillow, and sitting up as much as tolerated. Given current status, recommend HHOT at d/c to continue to progress BADL in home environment. Will continue to follow.    Follow Up Recommendations  Home health OT    Equipment Recommendations  3 in 1 bedside commode    Recommendations for Other Services       Precautions / Restrictions Precautions Precautions: Fall;Cervical Precaution Booklet Issued: No Precaution Comments: verbally reviewed with pt and family Required Braces or Orthoses: Cervical Brace Cervical Brace: Hard collar;At all times Restrictions Weight Bearing Restrictions: No Other Position/Activity Restrictions: pt able to perform gentle ROM to L shoulder, sling ordered, no specific WB  orders in chart      Mobility Bed Mobility Overal bed mobility: Needs Assistance Bed Mobility: Supine to Sit     Supine to sit: Mod assist;HOB elevated     General bed mobility comments: HOB elevated to 70 degrees, pt requires assistance to turn hips to edge of bed  Transfers Overall transfer level: Needs assistance Equipment used: 1 person hand held assist Transfers: Sit to/from Stand Sit to Stand: Min assist              Balance Overall balance assessment: Needs assistance Sitting-balance support: Single extremity supported;Feet supported Sitting balance-Leahy Scale: Fair Sitting balance - Comments: close supervision   Standing balance support: Single extremity supported Standing balance-Leahy Scale: Fair Standing balance comment: minG with RUE support of hand hold                           ADL either performed or assessed with clinical judgement   ADL Overall ADL's : Needs assistance/impaired Eating/Feeding: Set up;Sitting   Grooming: Min guard;Standing   Upper Body Bathing: Minimal assistance;Sitting;Adhering to UE precautions   Lower Body Bathing: Moderate assistance;Sit to/from stand;Sitting/lateral leans   Upper Body Dressing : Minimal assistance;Sitting;Adhering to UE precautions   Lower Body Dressing: Moderate assistance;Sitting/lateral leans;Sit to/from stand   Toilet Transfer: Minimal assistance;Stand-pivot;BSC   Toileting- Clothing Manipulation and Hygiene: Minimal assistance;Sitting/lateral lean;Sit to/from stand   Tub/ Banker: Minimal assistance;Ambulation;Shower seat   Functional mobility during ADLs: Minimal assistance;Cueing for safety       Vision Patient Visual Report: No change from baseline       Perception  Praxis      Pertinent Vitals/Pain Pain Assessment: Faces Faces Pain Scale: Hurts even more Pain Location: ribs with coughing Pain Descriptors / Indicators: Grimacing Pain Intervention(s):  Monitored during session;Repositioned     Hand Dominance Right   Extremity/Trunk Assessment Upper Extremity Assessment Upper Extremity Assessment: LUE deficits/detail LUE Deficits / Details: in sling due to scapular fracture LUE: Unable to fully assess due to pain   Lower Extremity Assessment Lower Extremity Assessment: Defer to PT evaluation   Cervical / Trunk Assessment Cervical / Trunk Assessment: Other exceptions Cervical / Trunk Exceptions: C-collar   Communication Communication Communication: No difficulties   Cognition Arousal/Alertness: Awake/alert Behavior During Therapy: WFL for tasks assessed/performed Overall Cognitive Status: Impaired/Different from baseline Area of Impairment: Safety/judgement;Awareness;Problem solving                         Safety/Judgement: Decreased awareness of safety;Decreased awareness of deficits Awareness: Emergent Problem Solving: Slow processing;Requires verbal cues General Comments: pt impulsive at time and with poor safety awareness. Stated he felt faint with cold sweats, but stated "no I dont want to sit down yet" despite multiple prompts from therapist   General Comments  VSS during session, pt on 4L [place]. Pt hypertensive with BPs of 183/97 with sitting and 172/103 with standing. Pt reports some dizziness with standing as well as cold sweat although no sweating noted. Pt impulsive and refuses to sit to PT request multiple times, eventually becomes agreeable and transfers to recliner.    Exercises     Shoulder Instructions      Home Living Family/patient expects to be discharged to:: Private residence Living Arrangements: Parent Available Help at Discharge: Family Type of Home: House Home Access: Stairs to enter [employer] of Steps: 2 in back door, 1 into kitchen Entrance Stairs-Rails: None Home Layout: Two level Alternate Level Stairs-Number of Steps: 12, pt lives upstairs (pt can stay on main if  needed) Alternate Level Stairs-Rails: Can reach both Bathroom Shower/Tub: Walk-in shower;Tub/shower unit   Bathroom Toilet: Handicapped height     Home Equipment: Cane - single point;Walker - 2 wheels;Walker - 4 wheels;Shower seat          Prior Functioning/Environment Level of Independence: Independent        Comments: working in [employer] Problem List: Decreased strength;Decreased knowledge of use of DME or AE;Decreased knowledge of precautions;Decreased activity tolerance;Impaired UE functional use;Cardiopulmonary status limiting activity;Impaired balance (sitting and/or standing);Pain      OT Treatment/Interventions: Self-care/ADL training;Therapeutic exercise;Patient/family education;Balance training;Energy conservation;Therapeutic activities;DME and/or AE instruction;Cognitive remediation/compensation    OT Goals(Current goals can be found in the care plan section) Acute Rehab OT Goals Patient Stated Goal: return to independence OT Goal Formulation: With patient Time For Goal Achievement: 04/18/20 Potential to Achieve Goals: Good  OT Frequency: Min 2X/week   Barriers to D/C:            Co-evaluation              AM-PAC OT "6 Clicks" Daily Activity     Outcome Measure Help from another person eating meals?: None Help from another person taking care of personal grooming?: A Little Help from another person toileting, which includes using toliet, bedpan, or urinal?: A Little Help from another person bathing (including washing, rinsing, drying)?: A Lot Help from another person to put on and taking off regular upper body clothing?: A Little Help from another person to put on  and taking off regular lower body clothing?: A Lot 6 Click Score: 17   End of Session Nurse Communication: Mobility status  Activity Tolerance: Patient tolerated treatment well Patient left: in chair;with call bell/phone within reach;with family/visitor present  OT Visit  Diagnosis: Unsteadiness on feet (R26.81);Other abnormalities of gait and mobility (R26.89);Pain;Other symptoms and signs involving cognitive function Pain - Right/Left: Left Pain - part of body: Shoulder;Arm                Time: [id number] OT Time Calculation (min): 39 min Charges:  OT General Charges $OT Visit: 1 Visit OT Evaluation $OT Eval Moderate Complexity: [place], MSOT, OTR/L Acute Rehabilitation Services [facility] Office Number: [phone] Pager: [phone]  Zenovia Jarred 04/04/2020, 1:46 PM

## 2020-04-05 NOTE — Progress Notes (Signed)
Subjective/Chief Complaint: Complains of rib pain   Objective: Vital signs in last 24 hours: Temp:  [97.8 F (36.6 C)-99.7 F (37.6 C)] 98 F (36.7 C) (05/23 0800) Pulse Rate:  [60-81] 80 (05/23 0600) Resp:  [10-22] 20 (05/23 0600) BP: (95-170)/(60-96) 150/80 (05/23 0600) SpO2:  [91 %-99 %] 94 % (05/23 0600)    Intake/Output from previous day: 05/22 0701 - 05/23 0700 In: 1592.4 [I.V.:1592.4] Out: -  Intake/Output this shift: No intake/output data recorded.  General appearance: alert and cooperative Resp: rhonchi bilaterally Cardio: regular rate and rhythm GI: soft, nontender  Lab Results:  Recent Labs    04/03/20 2142 04/04/20 0456  WBC 7.9 6.7  HGB 15.5 14.7  HCT 46.7 43.7  PLT 167 151   BMET Recent Labs    04/03/20 2142 04/04/20 0456  NA 134* 136  K 4.2 4.1  CL 98 106  CO2 24 22  GLUCOSE 149* 117*  BUN 16 14  CREATININE 1.20 1.00  CALCIUM 8.4* 8.2*   PT/INR Recent Labs    04/03/20 2142  LABPROT 12.9  INR 1.0   ABG No results for input(s): PHART, HCO3 in the last 72 hours.  Invalid input(s): PCO2, PO2  Studies/Results: CT HEAD WO CONTRAST  Addendum Date: 04/03/2020   ADDENDUM REPORT: 04/03/2020 23:05 ADDENDUM: Error identified in the physician notification attestation below -- should read: These results were called by telephone at the time of interpretation on 04/03/2020 at 10:27 pm to provider Dr. Kae Heller of surgery, who verbally acknowledged these results. Electronically Signed   By: Lovena Le M.D.   On: 04/03/2020 23:05   Result Date: 04/03/2020 CLINICAL DATA:  Level 1 trauma: Fell 20 feet from tree. EXAM: CT HEAD WITHOUT CONTRAST CT CERVICAL SPINE WITHOUT CONTRAST CT CHEST, ABDOMEN AND PELVIS WITH CONTRAST TECHNIQUE: Contiguous axial images were obtained from the base of the skull through the vertex without intravenous contrast. Multidetector CT imaging of the cervical spine was performed without intravenous contrast. Multiplanar CT  image reconstructions were also generated. Multidetector CT imaging of the chest, abdomen and pelvis was performed following the standard protocol during bolus administration of intravenous contrast. CONTRAST:  100 cc Omnipaque 350 IV COMPARISON:  Portable chest and pelvic radiograph same day FINDINGS: CT HEAD FINDINGS Brain: Likely remote lacune in the left external capsule/insula. No evidence of acute infarction, hemorrhage, hydrocephalus, extra-axial collection or mass lesion/mass effect. Symmetric prominence of the ventricles, cisterns and sulci compatible with parenchymal volume loss. Patchy areas of white matter hypoattenuation are most compatible with chronic microvascular angiopathy. Vascular: Atherosclerotic calcification of the carotid siphons. No hyperdense vessel. Skull: No focal scalp swelling or hematoma. No calvarial fracture or suspicious osseous lesion. Punctate dermal hyperdensities in the right frontal and left frontal scalp may reflect debris versus benign dermal calcification. Sinuses/Orbits: Minimal pneumatized secretions in the right maxillary sinus. Remaining paranasal sinuses mastoid air cells are predominantly clear. Middle ear cavities are clear. Ossicular chains appear normally configured. Included orbital structures are unremarkable. Other: None CT CERVICAL SPINE FINDINGS Alignment: Preservation of the normal cervical lordosis. Minimal retrolisthesis C3 on C4 and C5 on C6 are likely on a degenerative basis given spondylitic and facet degenerative changes at these levels. No evidence of traumatic listhesis. No abnormally widened, perched or jumped facets. Asymmetric left facet arthropathy is noted C4-C6. Normal alignment of the craniocervical and atlantoaxial articulations. Skull base and vertebrae: No acute fracture of the skull base or cervical vertebral bodies. Anterior wedging compression deformity of the T1 vertebral level (  AO spine A1). Compression deformity of T2 is better detailed  on chest CT and dedicated thoracic recon Speer posterior elements are intact. No worrisome osseous lesions. Soft tissues and spinal canal: No pre or paravertebral fluid or swelling. No visible canal hematoma. Disc levels: Multilevel intervertebral disc height loss with spondylitic endplate changes. Disc osteophyte complexes are present throughout the cervical spine with at most mild canal narrowing at the C5-6 and C6-7 levels secondary to posterior disc osteophyte complexes. CT CHEST FINDINGS Cardiovascular: The aortic root is suboptimally assessed given cardiac pulsation artifact. Calcifications upon the aortic leaflets. Atherosclerotic plaque within the normal caliber aorta. No intramural hematoma, dissection flap or other acute luminal abnormality of the aorta is seen. No periaortic stranding or hemorrhage. Shared origin of the brachiocephalic and left common carotid artery. Tortuosity of the brachiocephalic vasculature with minimal plaque in the proximal great vessels which are otherwise normally opacified. Central pulmonary arteries are normal caliber no large central filling defects though poorly assessed given suboptimal contrast timing for evaluation of the pulmonary arteries. Normal heart size. No pericardial effusion. Coronary artery atherosclerosis is present. Mediastinum/Nodes: No mediastinal fluid or gas. Normal thyroid gland and thoracic inlet. No acute abnormality of the trachea or esophagus. No worrisome mediastinal, hilar or axillary adenopathy. Lungs/Pleura: There is a trace anterior apical right pneumothorax (3/10). Small right pleural effusion posteriorly. No left pneumothorax or effusion. Some ground-glass in the adjacent posterior bilateral lower lobes, right greater than left, may reflect combination of atelectasis and contusive changes with multiple rib fractures detailed below. No other acute traumatic abnormality of the lung parenchyma. Musculoskeletal: Anterior wedging compression  deformity at T1 (AO spine A1) with 40% height loss anteriorly. Pincer type deformity of T2 with 60% height loss (AO spine A2). Incomplete burst fracture T3 superior endplate with up to D34-534 height loss (AO spine A3). 2 mm of retropulsion of the superior endplate fracture fragments. Atypical widening of the spinous processes of T2-3 and T3-4 some adjacent stranding and thickening, could suggest some underlying disruption of the posterior tension band. Fracture of the right transverse process T3 (AO spine A0). Multiple contiguous posterior 4th through 11th right rib fractures including additional lateral segmental fractures of the fourth through eighth ribs. Contiguous posterior fractures of the left 3rd to 12th ribs with more lateral segmental components of the ninth through eleventh ribs. There is a highly comminuted and displaced fracture of the left scapula extending predominantly through the inferior scapular body extending to the base of the scapular spine and glenoid. There is associated intramuscular contusion in likely hemorrhage within the infraspinatus and subscapularis muscle bellies. CT ABDOMEN PELVIS FINDINGS Hepatobiliary: No direct hepatic injury or perihepatic hematoma. No focal liver abnormality is seen. No gallstones, gallbladder wall thickening, or biliary dilatation. Pancreas: No direct pancreatic contusive change. Unremarkable. No pancreatic ductal dilatation or surrounding inflammatory changes. Spleen: No direct splenic injury or perisplenic hematoma. Normal splenic size. No concerning splenic lesion. Adrenals/Urinary Tract: Normal adrenal glands, no evidence of adrenal hematoma or hemorrhage. No direct renal lesion or perinephric hemorrhage. Areas of cortical scarring throughout the right kidney are present. No suspicious renal lesion, urolithiasis or hydronephrosis. No Urinary bladder is largely decompressed at the time of exam and therefore poorly evaluated by CT imaging. No evidence of direct  bladder injury. Stomach/Bowel: Motion artifact may limit detection of subtle bowel and mesenteric abnormalities. Distal esophagus, stomach and duodenal sweep are unremarkable. No small bowel wall thickening or dilatation. No evidence of obstruction. Fecalized contents of the distal small  bowel may reflect some slowed intestinal transit. The appendix is not well visualized. No colonic dilatation or wall thickening. No direct mesenteric contusion or hemorrhage is evident. Vascular/Lymphatic: No acute aortic injury or dissection is seen. There is a 3.4 cm infrarenal abdominal aortic aneurysm without periaortic stranding or hemorrhage. Atherosclerotic calcifications noted throughout the abdominal aorta and branch vessels. No suspicious or enlarged lymph nodes in the included lymphatic chains. Reproductive: The prostate and seminal vesicles are unremarkable. Other: There is a small volume of intermediate attenuation (27 HU) fluid in the deep pelvis without a clear source of bleeding or hemorrhage. Musculoskeletal: Dedicated lumbar reconstructions were generated and dictated separately. In brief there is a likely fracture of the left L1 and L2 transverse processes without vertebral body fracture or height loss. The bones of pelvis appear intact and congruent without acute osseous injury. IMPRESSION: Traumatic Findings: 1. No acute intracranial abnormality. 2. No acute cervical fracture or traumatic listhesis. 3. Anterior wedging compression deformity of the T1 vertebral level (AO spine A1) with up to 45% height loss anteriorly. 4. Pincer type deformity of T2 with up to 60% height loss (AO spine A2). 5. Incomplete burst fracture superior endplate T3 (AO spine A3). Up to 2 mm of retropulsion and at most mild canal stenosis. 6. Fracture of the right transverse process T3 (AO spine A0). 7. Widening of the spinous processes of T2-3 and T3-4. Cannot exclude disruption of the posterior tension band. Consider MRI for further  assessment. 8. Comminuted and displaced fracture of the left scapula involving the inferior scapular body extending to base of the scapular spine and glenoid. Associated intramuscular contusion/hemorrhage within the infraspinatus and subscapularis. 9. Contiguous fourth through eleventh right rib fractures including segmental fractures the fourth through eighth ribs. Contiguous third through eleventh left rib fractures including segmental fractures of the left ninth through eleventh ribs. Given the extent of the segmental rib fractures assessment for flail chest morphology is recommended. 10. Trace anterior apical right pneumothorax.  Small right effusion. 11. Dependent ground-glass in both lungs may reflect a combination of atelectasis and or minimal pulmonary contusive change. 12. Fractures of the L1 and L2 left transverse processes. 13. Small volume of intermediate attenuation fluid in the abdomen and pelvis without clear source such as direct bowel injury or mesenteric hematoma or contusion though motion artifact may limit detection of subtle bowel and mesenteric anomaly. Recommend close clinical monitoring and serial abdominal examinations. Nontraumatic Findings: 1. 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 2. Multilevel cervical spondylitic changes as well as additional multilevel degenerative changes in the thoracic and lumbar spine. 3. Aortic Atherosclerosis (ICD10-I70.0). 4. 3.4 cm infrarenal abdominal aortic aneurysm. Recommend followup by abdomen and pelvis CTA in 6 months, and vascular surgery referral/consultation if not already obtained. This recommendation follows ACR consensus guidelines: White Paper of the ACR Incidental Findings Committee II on Vascular Findings. J Am Coll Radiol 2013; 10:789-794. Aortic aneurysm NOS (ICD10-I71.9) 5. Coronary artery calcifications are present. Please note that the presence of coronary artery calcium documents the presence of  coronary artery disease, the severity of this disease and any potential stenosis cannot be assessed on this non-gated CT examination. 6. Cortical scarring seen in the right kidney. These results were called by telephone at the time of interpretation on 04/03/2020 at 10:27 pm to provider ADAM CURATOLO , who verbally acknowledged these results. Electronically Signed: By: Lovena Le M.D. On: 04/03/2020 22:48   CT CHEST W CONTRAST  Addendum Date: 04/03/2020  ADDENDUM REPORT: 04/03/2020 23:05 ADDENDUM: Error identified in the physician notification attestation below -- should read: These results were called by telephone at the time of interpretation on 04/03/2020 at 10:27 pm to provider Dr. Kae Heller of surgery, who verbally acknowledged these results. Electronically Signed   By: Lovena Le M.D.   On: 04/03/2020 23:05   Result Date: 04/03/2020 CLINICAL DATA:  Level 1 trauma: Fell 20 feet from tree. EXAM: CT HEAD WITHOUT CONTRAST CT CERVICAL SPINE WITHOUT CONTRAST CT CHEST, ABDOMEN AND PELVIS WITH CONTRAST TECHNIQUE: Contiguous axial images were obtained from the base of the skull through the vertex without intravenous contrast. Multidetector CT imaging of the cervical spine was performed without intravenous contrast. Multiplanar CT image reconstructions were also generated. Multidetector CT imaging of the chest, abdomen and pelvis was performed following the standard protocol during bolus administration of intravenous contrast. CONTRAST:  100 cc Omnipaque 350 IV COMPARISON:  Portable chest and pelvic radiograph same day FINDINGS: CT HEAD FINDINGS Brain: Likely remote lacune in the left external capsule/insula. No evidence of acute infarction, hemorrhage, hydrocephalus, extra-axial collection or mass lesion/mass effect. Symmetric prominence of the ventricles, cisterns and sulci compatible with parenchymal volume loss. Patchy areas of white matter hypoattenuation are most compatible with chronic microvascular  angiopathy. Vascular: Atherosclerotic calcification of the carotid siphons. No hyperdense vessel. Skull: No focal scalp swelling or hematoma. No calvarial fracture or suspicious osseous lesion. Punctate dermal hyperdensities in the right frontal and left frontal scalp may reflect debris versus benign dermal calcification. Sinuses/Orbits: Minimal pneumatized secretions in the right maxillary sinus. Remaining paranasal sinuses mastoid air cells are predominantly clear. Middle ear cavities are clear. Ossicular chains appear normally configured. Included orbital structures are unremarkable. Other: None CT CERVICAL SPINE FINDINGS Alignment: Preservation of the normal cervical lordosis. Minimal retrolisthesis C3 on C4 and C5 on C6 are likely on a degenerative basis given spondylitic and facet degenerative changes at these levels. No evidence of traumatic listhesis. No abnormally widened, perched or jumped facets. Asymmetric left facet arthropathy is noted C4-C6. Normal alignment of the craniocervical and atlantoaxial articulations. Skull base and vertebrae: No acute fracture of the skull base or cervical vertebral bodies. Anterior wedging compression deformity of the T1 vertebral level (AO spine A1). Compression deformity of T2 is better detailed on chest CT and dedicated thoracic recon Speer posterior elements are intact. No worrisome osseous lesions. Soft tissues and spinal canal: No pre or paravertebral fluid or swelling. No visible canal hematoma. Disc levels: Multilevel intervertebral disc height loss with spondylitic endplate changes. Disc osteophyte complexes are present throughout the cervical spine with at most mild canal narrowing at the C5-6 and C6-7 levels secondary to posterior disc osteophyte complexes. CT CHEST FINDINGS Cardiovascular: The aortic root is suboptimally assessed given cardiac pulsation artifact. Calcifications upon the aortic leaflets. Atherosclerotic plaque within the normal caliber aorta. No  intramural hematoma, dissection flap or other acute luminal abnormality of the aorta is seen. No periaortic stranding or hemorrhage. Shared origin of the brachiocephalic and left common carotid artery. Tortuosity of the brachiocephalic vasculature with minimal plaque in the proximal great vessels which are otherwise normally opacified. Central pulmonary arteries are normal caliber no large central filling defects though poorly assessed given suboptimal contrast timing for evaluation of the pulmonary arteries. Normal heart size. No pericardial effusion. Coronary artery atherosclerosis is present. Mediastinum/Nodes: No mediastinal fluid or gas. Normal thyroid gland and thoracic inlet. No acute abnormality of the trachea or esophagus. No worrisome mediastinal, hilar or axillary adenopathy. Lungs/Pleura: There is  a trace anterior apical right pneumothorax (3/10). Small right pleural effusion posteriorly. No left pneumothorax or effusion. Some ground-glass in the adjacent posterior bilateral lower lobes, right greater than left, may reflect combination of atelectasis and contusive changes with multiple rib fractures detailed below. No other acute traumatic abnormality of the lung parenchyma. Musculoskeletal: Anterior wedging compression deformity at T1 (AO spine A1) with 40% height loss anteriorly. Pincer type deformity of T2 with 60% height loss (AO spine A2). Incomplete burst fracture T3 superior endplate with up to D34-534 height loss (AO spine A3). 2 mm of retropulsion of the superior endplate fracture fragments. Atypical widening of the spinous processes of T2-3 and T3-4 some adjacent stranding and thickening, could suggest some underlying disruption of the posterior tension band. Fracture of the right transverse process T3 (AO spine A0). Multiple contiguous posterior 4th through 11th right rib fractures including additional lateral segmental fractures of the fourth through eighth ribs. Contiguous posterior fractures of  the left 3rd to 12th ribs with more lateral segmental components of the ninth through eleventh ribs. There is a highly comminuted and displaced fracture of the left scapula extending predominantly through the inferior scapular body extending to the base of the scapular spine and glenoid. There is associated intramuscular contusion in likely hemorrhage within the infraspinatus and subscapularis muscle bellies. CT ABDOMEN PELVIS FINDINGS Hepatobiliary: No direct hepatic injury or perihepatic hematoma. No focal liver abnormality is seen. No gallstones, gallbladder wall thickening, or biliary dilatation. Pancreas: No direct pancreatic contusive change. Unremarkable. No pancreatic ductal dilatation or surrounding inflammatory changes. Spleen: No direct splenic injury or perisplenic hematoma. Normal splenic size. No concerning splenic lesion. Adrenals/Urinary Tract: Normal adrenal glands, no evidence of adrenal hematoma or hemorrhage. No direct renal lesion or perinephric hemorrhage. Areas of cortical scarring throughout the right kidney are present. No suspicious renal lesion, urolithiasis or hydronephrosis. No Urinary bladder is largely decompressed at the time of exam and therefore poorly evaluated by CT imaging. No evidence of direct bladder injury. Stomach/Bowel: Motion artifact may limit detection of subtle bowel and mesenteric abnormalities. Distal esophagus, stomach and duodenal sweep are unremarkable. No small bowel wall thickening or dilatation. No evidence of obstruction. Fecalized contents of the distal small bowel may reflect some slowed intestinal transit. The appendix is not well visualized. No colonic dilatation or wall thickening. No direct mesenteric contusion or hemorrhage is evident. Vascular/Lymphatic: No acute aortic injury or dissection is seen. There is a 3.4 cm infrarenal abdominal aortic aneurysm without periaortic stranding or hemorrhage. Atherosclerotic calcifications noted throughout the  abdominal aorta and branch vessels. No suspicious or enlarged lymph nodes in the included lymphatic chains. Reproductive: The prostate and seminal vesicles are unremarkable. Other: There is a small volume of intermediate attenuation (27 HU) fluid in the deep pelvis without a clear source of bleeding or hemorrhage. Musculoskeletal: Dedicated lumbar reconstructions were generated and dictated separately. In brief there is a likely fracture of the left L1 and L2 transverse processes without vertebral body fracture or height loss. The bones of pelvis appear intact and congruent without acute osseous injury. IMPRESSION: Traumatic Findings: 1. No acute intracranial abnormality. 2. No acute cervical fracture or traumatic listhesis. 3. Anterior wedging compression deformity of the T1 vertebral level (AO spine A1) with up to 45% height loss anteriorly. 4. Pincer type deformity of T2 with up to 60% height loss (AO spine A2). 5. Incomplete burst fracture superior endplate T3 (AO spine A3). Up to 2 mm of retropulsion and at most mild canal stenosis. 6.  Fracture of the right transverse process T3 (AO spine A0). 7. Widening of the spinous processes of T2-3 and T3-4. Cannot exclude disruption of the posterior tension band. Consider MRI for further assessment. 8. Comminuted and displaced fracture of the left scapula involving the inferior scapular body extending to base of the scapular spine and glenoid. Associated intramuscular contusion/hemorrhage within the infraspinatus and subscapularis. 9. Contiguous fourth through eleventh right rib fractures including segmental fractures the fourth through eighth ribs. Contiguous third through eleventh left rib fractures including segmental fractures of the left ninth through eleventh ribs. Given the extent of the segmental rib fractures assessment for flail chest morphology is recommended. 10. Trace anterior apical right pneumothorax.  Small right effusion. 11. Dependent ground-glass in  both lungs may reflect a combination of atelectasis and or minimal pulmonary contusive change. 12. Fractures of the L1 and L2 left transverse processes. 13. Small volume of intermediate attenuation fluid in the abdomen and pelvis without clear source such as direct bowel injury or mesenteric hematoma or contusion though motion artifact may limit detection of subtle bowel and mesenteric anomaly. Recommend close clinical monitoring and serial abdominal examinations. Nontraumatic Findings: 1. 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 2. Multilevel cervical spondylitic changes as well as additional multilevel degenerative changes in the thoracic and lumbar spine. 3. Aortic Atherosclerosis (ICD10-I70.0). 4. 3.4 cm infrarenal abdominal aortic aneurysm. Recommend followup by abdomen and pelvis CTA in 6 months, and vascular surgery referral/consultation if not already obtained. This recommendation follows ACR consensus guidelines: White Paper of the ACR Incidental Findings Committee II on Vascular Findings. J Am Coll Radiol 2013; 10:789-794. Aortic aneurysm NOS (ICD10-I71.9) 5. Coronary artery calcifications are present. Please note that the presence of coronary artery calcium documents the presence of coronary artery disease, the severity of this disease and any potential stenosis cannot be assessed on this non-gated CT examination. 6. Cortical scarring seen in the right kidney. These results were called by telephone at the time of interpretation on 04/03/2020 at 10:27 pm to provider ADAM CURATOLO , who verbally acknowledged these results. Electronically Signed: By: Lovena Le M.D. On: 04/03/2020 22:48   CT Cervical Spine Wo Contrast  Addendum Date: 04/03/2020   ADDENDUM REPORT: 04/03/2020 23:05 ADDENDUM: Error identified in the physician notification attestation below -- should read: These results were called by telephone at the time of interpretation on 04/03/2020 at 10:27 pm  to provider Dr. Kae Heller of surgery, who verbally acknowledged these results. Electronically Signed   By: Lovena Le M.D.   On: 04/03/2020 23:05   Result Date: 04/03/2020 CLINICAL DATA:  Level 1 trauma: Fell 20 feet from tree. EXAM: CT HEAD WITHOUT CONTRAST CT CERVICAL SPINE WITHOUT CONTRAST CT CHEST, ABDOMEN AND PELVIS WITH CONTRAST TECHNIQUE: Contiguous axial images were obtained from the base of the skull through the vertex without intravenous contrast. Multidetector CT imaging of the cervical spine was performed without intravenous contrast. Multiplanar CT image reconstructions were also generated. Multidetector CT imaging of the chest, abdomen and pelvis was performed following the standard protocol during bolus administration of intravenous contrast. CONTRAST:  100 cc Omnipaque 350 IV COMPARISON:  Portable chest and pelvic radiograph same day FINDINGS: CT HEAD FINDINGS Brain: Likely remote lacune in the left external capsule/insula. No evidence of acute infarction, hemorrhage, hydrocephalus, extra-axial collection or mass lesion/mass effect. Symmetric prominence of the ventricles, cisterns and sulci compatible with parenchymal volume loss. Patchy areas of white matter hypoattenuation are most compatible with chronic microvascular angiopathy. Vascular: Atherosclerotic  calcification of the carotid siphons. No hyperdense vessel. Skull: No focal scalp swelling or hematoma. No calvarial fracture or suspicious osseous lesion. Punctate dermal hyperdensities in the right frontal and left frontal scalp may reflect debris versus benign dermal calcification. Sinuses/Orbits: Minimal pneumatized secretions in the right maxillary sinus. Remaining paranasal sinuses mastoid air cells are predominantly clear. Middle ear cavities are clear. Ossicular chains appear normally configured. Included orbital structures are unremarkable. Other: None CT CERVICAL SPINE FINDINGS Alignment: Preservation of the normal cervical lordosis.  Minimal retrolisthesis C3 on C4 and C5 on C6 are likely on a degenerative basis given spondylitic and facet degenerative changes at these levels. No evidence of traumatic listhesis. No abnormally widened, perched or jumped facets. Asymmetric left facet arthropathy is noted C4-C6. Normal alignment of the craniocervical and atlantoaxial articulations. Skull base and vertebrae: No acute fracture of the skull base or cervical vertebral bodies. Anterior wedging compression deformity of the T1 vertebral level (AO spine A1). Compression deformity of T2 is better detailed on chest CT and dedicated thoracic recon Speer posterior elements are intact. No worrisome osseous lesions. Soft tissues and spinal canal: No pre or paravertebral fluid or swelling. No visible canal hematoma. Disc levels: Multilevel intervertebral disc height loss with spondylitic endplate changes. Disc osteophyte complexes are present throughout the cervical spine with at most mild canal narrowing at the C5-6 and C6-7 levels secondary to posterior disc osteophyte complexes. CT CHEST FINDINGS Cardiovascular: The aortic root is suboptimally assessed given cardiac pulsation artifact. Calcifications upon the aortic leaflets. Atherosclerotic plaque within the normal caliber aorta. No intramural hematoma, dissection flap or other acute luminal abnormality of the aorta is seen. No periaortic stranding or hemorrhage. Shared origin of the brachiocephalic and left common carotid artery. Tortuosity of the brachiocephalic vasculature with minimal plaque in the proximal great vessels which are otherwise normally opacified. Central pulmonary arteries are normal caliber no large central filling defects though poorly assessed given suboptimal contrast timing for evaluation of the pulmonary arteries. Normal heart size. No pericardial effusion. Coronary artery atherosclerosis is present. Mediastinum/Nodes: No mediastinal fluid or gas. Normal thyroid gland and thoracic  inlet. No acute abnormality of the trachea or esophagus. No worrisome mediastinal, hilar or axillary adenopathy. Lungs/Pleura: There is a trace anterior apical right pneumothorax (3/10). Small right pleural effusion posteriorly. No left pneumothorax or effusion. Some ground-glass in the adjacent posterior bilateral lower lobes, right greater than left, may reflect combination of atelectasis and contusive changes with multiple rib fractures detailed below. No other acute traumatic abnormality of the lung parenchyma. Musculoskeletal: Anterior wedging compression deformity at T1 (AO spine A1) with 40% height loss anteriorly. Pincer type deformity of T2 with 60% height loss (AO spine A2). Incomplete burst fracture T3 superior endplate with up to D34-534 height loss (AO spine A3). 2 mm of retropulsion of the superior endplate fracture fragments. Atypical widening of the spinous processes of T2-3 and T3-4 some adjacent stranding and thickening, could suggest some underlying disruption of the posterior tension band. Fracture of the right transverse process T3 (AO spine A0). Multiple contiguous posterior 4th through 11th right rib fractures including additional lateral segmental fractures of the fourth through eighth ribs. Contiguous posterior fractures of the left 3rd to 12th ribs with more lateral segmental components of the ninth through eleventh ribs. There is a highly comminuted and displaced fracture of the left scapula extending predominantly through the inferior scapular body extending to the base of the scapular spine and glenoid. There is associated intramuscular contusion in likely hemorrhage  within the infraspinatus and subscapularis muscle bellies. CT ABDOMEN PELVIS FINDINGS Hepatobiliary: No direct hepatic injury or perihepatic hematoma. No focal liver abnormality is seen. No gallstones, gallbladder wall thickening, or biliary dilatation. Pancreas: No direct pancreatic contusive change. Unremarkable. No pancreatic  ductal dilatation or surrounding inflammatory changes. Spleen: No direct splenic injury or perisplenic hematoma. Normal splenic size. No concerning splenic lesion. Adrenals/Urinary Tract: Normal adrenal glands, no evidence of adrenal hematoma or hemorrhage. No direct renal lesion or perinephric hemorrhage. Areas of cortical scarring throughout the right kidney are present. No suspicious renal lesion, urolithiasis or hydronephrosis. No Urinary bladder is largely decompressed at the time of exam and therefore poorly evaluated by CT imaging. No evidence of direct bladder injury. Stomach/Bowel: Motion artifact may limit detection of subtle bowel and mesenteric abnormalities. Distal esophagus, stomach and duodenal sweep are unremarkable. No small bowel wall thickening or dilatation. No evidence of obstruction. Fecalized contents of the distal small bowel may reflect some slowed intestinal transit. The appendix is not well visualized. No colonic dilatation or wall thickening. No direct mesenteric contusion or hemorrhage is evident. Vascular/Lymphatic: No acute aortic injury or dissection is seen. There is a 3.4 cm infrarenal abdominal aortic aneurysm without periaortic stranding or hemorrhage. Atherosclerotic calcifications noted throughout the abdominal aorta and branch vessels. No suspicious or enlarged lymph nodes in the included lymphatic chains. Reproductive: The prostate and seminal vesicles are unremarkable. Other: There is a small volume of intermediate attenuation (27 HU) fluid in the deep pelvis without a clear source of bleeding or hemorrhage. Musculoskeletal: Dedicated lumbar reconstructions were generated and dictated separately. In brief there is a likely fracture of the left L1 and L2 transverse processes without vertebral body fracture or height loss. The bones of pelvis appear intact and congruent without acute osseous injury. IMPRESSION: Traumatic Findings: 1. No acute intracranial abnormality. 2. No  acute cervical fracture or traumatic listhesis. 3. Anterior wedging compression deformity of the T1 vertebral level (AO spine A1) with up to 45% height loss anteriorly. 4. Pincer type deformity of T2 with up to 60% height loss (AO spine A2). 5. Incomplete burst fracture superior endplate T3 (AO spine A3). Up to 2 mm of retropulsion and at most mild canal stenosis. 6. Fracture of the right transverse process T3 (AO spine A0). 7. Widening of the spinous processes of T2-3 and T3-4. Cannot exclude disruption of the posterior tension band. Consider MRI for further assessment. 8. Comminuted and displaced fracture of the left scapula involving the inferior scapular body extending to base of the scapular spine and glenoid. Associated intramuscular contusion/hemorrhage within the infraspinatus and subscapularis. 9. Contiguous fourth through eleventh right rib fractures including segmental fractures the fourth through eighth ribs. Contiguous third through eleventh left rib fractures including segmental fractures of the left ninth through eleventh ribs. Given the extent of the segmental rib fractures assessment for flail chest morphology is recommended. 10. Trace anterior apical right pneumothorax.  Small right effusion. 11. Dependent ground-glass in both lungs may reflect a combination of atelectasis and or minimal pulmonary contusive change. 12. Fractures of the L1 and L2 left transverse processes. 13. Small volume of intermediate attenuation fluid in the abdomen and pelvis without clear source such as direct bowel injury or mesenteric hematoma or contusion though motion artifact may limit detection of subtle bowel and mesenteric anomaly. Recommend close clinical monitoring and serial abdominal examinations. Nontraumatic Findings: 1. 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 2. Multilevel cervical spondylitic changes as well as additional  multilevel degenerative changes in the  thoracic and lumbar spine. 3. Aortic Atherosclerosis (ICD10-I70.0). 4. 3.4 cm infrarenal abdominal aortic aneurysm. Recommend followup by abdomen and pelvis CTA in 6 months, and vascular surgery referral/consultation if not already obtained. This recommendation follows ACR consensus guidelines: White Paper of the ACR Incidental Findings Committee II on Vascular Findings. J Am Coll Radiol 2013; 10:789-794. Aortic aneurysm NOS (ICD10-I71.9) 5. Coronary artery calcifications are present. Please note that the presence of coronary artery calcium documents the presence of coronary artery disease, the severity of this disease and any potential stenosis cannot be assessed on this non-gated CT examination. 6. Cortical scarring seen in the right kidney. These results were called by telephone at the time of interpretation on 04/03/2020 at 10:27 pm to provider ADAM CURATOLO , who verbally acknowledged these results. Electronically Signed: By: Lovena Le M.D. On: 04/03/2020 22:48   CT ABDOMEN PELVIS W CONTRAST  Addendum Date: 04/03/2020   ADDENDUM REPORT: 04/03/2020 23:05 ADDENDUM: Error identified in the physician notification attestation below -- should read: These results were called by telephone at the time of interpretation on 04/03/2020 at 10:27 pm to provider Dr. Kae Heller of surgery, who verbally acknowledged these results. Electronically Signed   By: Lovena Le M.D.   On: 04/03/2020 23:05   Result Date: 04/03/2020 CLINICAL DATA:  Level 1 trauma: Fell 20 feet from tree. EXAM: CT HEAD WITHOUT CONTRAST CT CERVICAL SPINE WITHOUT CONTRAST CT CHEST, ABDOMEN AND PELVIS WITH CONTRAST TECHNIQUE: Contiguous axial images were obtained from the base of the skull through the vertex without intravenous contrast. Multidetector CT imaging of the cervical spine was performed without intravenous contrast. Multiplanar CT image reconstructions were also generated. Multidetector CT imaging of the chest, abdomen and pelvis was performed  following the standard protocol during bolus administration of intravenous contrast. CONTRAST:  100 cc Omnipaque 350 IV COMPARISON:  Portable chest and pelvic radiograph same day FINDINGS: CT HEAD FINDINGS Brain: Likely remote lacune in the left external capsule/insula. No evidence of acute infarction, hemorrhage, hydrocephalus, extra-axial collection or mass lesion/mass effect. Symmetric prominence of the ventricles, cisterns and sulci compatible with parenchymal volume loss. Patchy areas of white matter hypoattenuation are most compatible with chronic microvascular angiopathy. Vascular: Atherosclerotic calcification of the carotid siphons. No hyperdense vessel. Skull: No focal scalp swelling or hematoma. No calvarial fracture or suspicious osseous lesion. Punctate dermal hyperdensities in the right frontal and left frontal scalp may reflect debris versus benign dermal calcification. Sinuses/Orbits: Minimal pneumatized secretions in the right maxillary sinus. Remaining paranasal sinuses mastoid air cells are predominantly clear. Middle ear cavities are clear. Ossicular chains appear normally configured. Included orbital structures are unremarkable. Other: None CT CERVICAL SPINE FINDINGS Alignment: Preservation of the normal cervical lordosis. Minimal retrolisthesis C3 on C4 and C5 on C6 are likely on a degenerative basis given spondylitic and facet degenerative changes at these levels. No evidence of traumatic listhesis. No abnormally widened, perched or jumped facets. Asymmetric left facet arthropathy is noted C4-C6. Normal alignment of the craniocervical and atlantoaxial articulations. Skull base and vertebrae: No acute fracture of the skull base or cervical vertebral bodies. Anterior wedging compression deformity of the T1 vertebral level (AO spine A1). Compression deformity of T2 is better detailed on chest CT and dedicated thoracic recon Speer posterior elements are intact. No worrisome osseous lesions. Soft  tissues and spinal canal: No pre or paravertebral fluid or swelling. No visible canal hematoma. Disc levels: Multilevel intervertebral disc height loss with spondylitic endplate changes. Disc osteophyte complexes  are present throughout the cervical spine with at most mild canal narrowing at the C5-6 and C6-7 levels secondary to posterior disc osteophyte complexes. CT CHEST FINDINGS Cardiovascular: The aortic root is suboptimally assessed given cardiac pulsation artifact. Calcifications upon the aortic leaflets. Atherosclerotic plaque within the normal caliber aorta. No intramural hematoma, dissection flap or other acute luminal abnormality of the aorta is seen. No periaortic stranding or hemorrhage. Shared origin of the brachiocephalic and left common carotid artery. Tortuosity of the brachiocephalic vasculature with minimal plaque in the proximal great vessels which are otherwise normally opacified. Central pulmonary arteries are normal caliber no large central filling defects though poorly assessed given suboptimal contrast timing for evaluation of the pulmonary arteries. Normal heart size. No pericardial effusion. Coronary artery atherosclerosis is present. Mediastinum/Nodes: No mediastinal fluid or gas. Normal thyroid gland and thoracic inlet. No acute abnormality of the trachea or esophagus. No worrisome mediastinal, hilar or axillary adenopathy. Lungs/Pleura: There is a trace anterior apical right pneumothorax (3/10). Small right pleural effusion posteriorly. No left pneumothorax or effusion. Some ground-glass in the adjacent posterior bilateral lower lobes, right greater than left, may reflect combination of atelectasis and contusive changes with multiple rib fractures detailed below. No other acute traumatic abnormality of the lung parenchyma. Musculoskeletal: Anterior wedging compression deformity at T1 (AO spine A1) with 40% height loss anteriorly. Pincer type deformity of T2 with 60% height loss (AO spine  A2). Incomplete burst fracture T3 superior endplate with up to D34-534 height loss (AO spine A3). 2 mm of retropulsion of the superior endplate fracture fragments. Atypical widening of the spinous processes of T2-3 and T3-4 some adjacent stranding and thickening, could suggest some underlying disruption of the posterior tension band. Fracture of the right transverse process T3 (AO spine A0). Multiple contiguous posterior 4th through 11th right rib fractures including additional lateral segmental fractures of the fourth through eighth ribs. Contiguous posterior fractures of the left 3rd to 12th ribs with more lateral segmental components of the ninth through eleventh ribs. There is a highly comminuted and displaced fracture of the left scapula extending predominantly through the inferior scapular body extending to the base of the scapular spine and glenoid. There is associated intramuscular contusion in likely hemorrhage within the infraspinatus and subscapularis muscle bellies. CT ABDOMEN PELVIS FINDINGS Hepatobiliary: No direct hepatic injury or perihepatic hematoma. No focal liver abnormality is seen. No gallstones, gallbladder wall thickening, or biliary dilatation. Pancreas: No direct pancreatic contusive change. Unremarkable. No pancreatic ductal dilatation or surrounding inflammatory changes. Spleen: No direct splenic injury or perisplenic hematoma. Normal splenic size. No concerning splenic lesion. Adrenals/Urinary Tract: Normal adrenal glands, no evidence of adrenal hematoma or hemorrhage. No direct renal lesion or perinephric hemorrhage. Areas of cortical scarring throughout the right kidney are present. No suspicious renal lesion, urolithiasis or hydronephrosis. No Urinary bladder is largely decompressed at the time of exam and therefore poorly evaluated by CT imaging. No evidence of direct bladder injury. Stomach/Bowel: Motion artifact may limit detection of subtle bowel and mesenteric abnormalities. Distal  esophagus, stomach and duodenal sweep are unremarkable. No small bowel wall thickening or dilatation. No evidence of obstruction. Fecalized contents of the distal small bowel may reflect some slowed intestinal transit. The appendix is not well visualized. No colonic dilatation or wall thickening. No direct mesenteric contusion or hemorrhage is evident. Vascular/Lymphatic: No acute aortic injury or dissection is seen. There is a 3.4 cm infrarenal abdominal aortic aneurysm without periaortic stranding or hemorrhage. Atherosclerotic calcifications noted throughout the abdominal aorta  and branch vessels. No suspicious or enlarged lymph nodes in the included lymphatic chains. Reproductive: The prostate and seminal vesicles are unremarkable. Other: There is a small volume of intermediate attenuation (27 HU) fluid in the deep pelvis without a clear source of bleeding or hemorrhage. Musculoskeletal: Dedicated lumbar reconstructions were generated and dictated separately. In brief there is a likely fracture of the left L1 and L2 transverse processes without vertebral body fracture or height loss. The bones of pelvis appear intact and congruent without acute osseous injury. IMPRESSION: Traumatic Findings: 1. No acute intracranial abnormality. 2. No acute cervical fracture or traumatic listhesis. 3. Anterior wedging compression deformity of the T1 vertebral level (AO spine A1) with up to 45% height loss anteriorly. 4. Pincer type deformity of T2 with up to 60% height loss (AO spine A2). 5. Incomplete burst fracture superior endplate T3 (AO spine A3). Up to 2 mm of retropulsion and at most mild canal stenosis. 6. Fracture of the right transverse process T3 (AO spine A0). 7. Widening of the spinous processes of T2-3 and T3-4. Cannot exclude disruption of the posterior tension band. Consider MRI for further assessment. 8. Comminuted and displaced fracture of the left scapula involving the inferior scapular body extending to base  of the scapular spine and glenoid. Associated intramuscular contusion/hemorrhage within the infraspinatus and subscapularis. 9. Contiguous fourth through eleventh right rib fractures including segmental fractures the fourth through eighth ribs. Contiguous third through eleventh left rib fractures including segmental fractures of the left ninth through eleventh ribs. Given the extent of the segmental rib fractures assessment for flail chest morphology is recommended. 10. Trace anterior apical right pneumothorax.  Small right effusion. 11. Dependent ground-glass in both lungs may reflect a combination of atelectasis and or minimal pulmonary contusive change. 12. Fractures of the L1 and L2 left transverse processes. 13. Small volume of intermediate attenuation fluid in the abdomen and pelvis without clear source such as direct bowel injury or mesenteric hematoma or contusion though motion artifact may limit detection of subtle bowel and mesenteric anomaly. Recommend close clinical monitoring and serial abdominal examinations. Nontraumatic Findings: 1. 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 2. Multilevel cervical spondylitic changes as well as additional multilevel degenerative changes in the thoracic and lumbar spine. 3. Aortic Atherosclerosis (ICD10-I70.0). 4. 3.4 cm infrarenal abdominal aortic aneurysm. Recommend followup by abdomen and pelvis CTA in 6 months, and vascular surgery referral/consultation if not already obtained. This recommendation follows ACR consensus guidelines: White Paper of the ACR Incidental Findings Committee II on Vascular Findings. J Am Coll Radiol 2013; 10:789-794. Aortic aneurysm NOS (ICD10-I71.9) 5. Coronary artery calcifications are present. Please note that the presence of coronary artery calcium documents the presence of coronary artery disease, the severity of this disease and any potential stenosis cannot be assessed on this non-gated CT  examination. 6. Cortical scarring seen in the right kidney. These results were called by telephone at the time of interpretation on 04/03/2020 at 10:27 pm to provider ADAM CURATOLO , who verbally acknowledged these results. Electronically Signed: By: Lovena Le M.D. On: 04/03/2020 22:48   DG Pelvis Portable  Result Date: 04/03/2020 CLINICAL DATA:  [place] out of tree, pain EXAM: PORTABLE PELVIS 1-2 VIEWS COMPARISON:  None. FINDINGS: Supine frontal view of the pelvis including both hips demonstrates no displaced fractures. Alignment is anatomic. Soft tissues are unremarkable. IMPRESSION: 1. No acute pelvic fracture. Electronically Signed   By: Randa Ngo M.D.   On: 04/03/2020 21:52  CT T-SPINE NO CHARGE  Result Date: 04/03/2020 CLINICAL DATA:  Level 1 trauma: Fell 20 feet from tree EXAM: CT THORACIC, AND LUMBAR SPINE WITHOUT CONTRAST TECHNIQUE: Multiplanar CT reconstructions of the thoracic and lumbar spine were generated from contemporary CT of the chest, abdomen and pelvis in the setting of trauma. COMPARISON:  Contemporary CT of the chest, abdomen and pelvis, radiograph of the chest and pelvis FINDINGS: CT THORACIC SPINE FINDINGS Alignment: Mild straightening of the normal lower thoracic kyphosis with slight exaggeration superiorly. Mild atypical widening of the T2-3 and T3-4 spinous processes beyond what is expected for the kyphotic curvature. Vertebrae: Multiple osseous injuries include: *Anterior wedging compression deformity at T1 with 40% height loss anteriorly (AOSpine A1). *Pincer type deformity of T2 with 60% height loss (AOSpine A2). *Incomplete burst fracture T3 superior endplate with up to D34-534 height loss (AO spine A3). 2 mm of retropulsion of the superior endplate fracture fragments. *Fracture of the right transverse process T3 (AO spine A0). *Thin sclerotic bands seen in the superior endplate T5 and T8 and extending from the inferior plate of T6 are indeterminate but could reflect  subcortical impaction fracture in the setting of significant trauma. Would be better assessed with MR. *Multiple contiguous bilateral rib fractures better detailed on dedicated CT of the chest from which this study is reconstructed. Paraspinal and other soft tissues: The thickening and likely trace hemorrhage adjacent the T1-T3 compression deformities. No other paraspinal fluid or swelling. No visible canal hematoma. Slight bowing of the supraspinous ligament T2-3. Some adjacent soft tissue stranding though this may be related to posterior contusive changes in a comminuted left scapular fracture. Disc levels: Retropulsion of fracture fragments at T3 result in some minimal effacement of the ventral thecal sac without significant canal stenosis. No other significant narrowing or foraminal stenosis is seen within the thoracic spine. CT LUMBAR SPINE FINDINGS Segmentation: 5 lumbar type vertebrae are present. Alignment: Slight straightening of the normal lumbar lordosis. Mild retrolisthesis L3 on 4 of approximately 2 mm may be degenerative with some early discogenic change at this level. No associated spondylolysis. Vertebrae: Minimally displaced fractures of the left T1 and T2 transverse processes. No vertebral body fracture or height loss is evident. Multilevel discogenic changes as detailed below.Included portions of the bony pelvis are intact. Paraspinal and other soft tissues: Small amount of thickening in stranding adjacent the left T1 and T2 transverse processes. No over paraspinal thickening swelling or hemorrhage. Some mild contusive changes noted posteriorly. For additional findings in the abdomen and pelvis, please see dedicated CT chest, abdomen and pelvis from which this study is reconstructed. Disc levels: Multilevel intervertebral disc height loss is present with some mild global disc bulging most pronounced C3-4 and C4-5 where there is resulting mild canal stenosis. A shallow calcified central disc  protrusion is noted at L5-S1 without significant resulting stenosis. Aforementioned disc bulges and mild facet degenerative changes result in mild bilateral foraminal narrowing C3-4 and C4-5. IMPRESSION: CT THORACIC SPINE 1. Anterior wedging compression deformity at T1 with 40% height loss anteriorly (AOSpine A1). 2. Pincer type deformity of T2 with 60% height loss (AOSpine A2). 3. Incomplete burst fracture T3 superior endplate with up to D34-534 height loss (AOSpine A3) and of the right transverse process T3 (AOSpine A0). 4. Widening of the interspinous distances T2-3 and T3-4 slightly greater than expected for the degree of kyphosis. Given the vertebral body fractures at these levels, involvement of the posterior tension band is not fully excluded. Consider further evaluation with MRI.  5. Thin sclerotic bands in the superior endplate T5 and T8 and extending from the inferior plate of T6 are indeterminate but could reflect subcortical impaction fracture in the setting of significant trauma. CT LUMBAR SPINE 1. Nondisplaced fractures of the left L1 and L2 transverse processes. 2. No acute vertebral body fracture or vertebral body height loss identified. 3. Multilevel discogenic changes most pronounced C3-4 and C4-5 with resulting mild canal stenosis. 4. Mild retrolisthesis L3 on 4 may be degenerative with some early discogenic change at this level. For additional findings within the chest, abdomen and pelvis, please see dedicated CT from which this study is reconstructed. These results were called by telephone at the time of interpretation on 04/03/2020 at 10: 27 pm to provider MD Kae Heller of surgery , who verbally acknowledged these results. Electronically Signed   By: Lovena Le M.D.   On: 04/03/2020 23:04   CT L-SPINE NO CHARGE  Result Date: 04/03/2020 CLINICAL DATA:  Level 1 trauma: Fell 20 feet from tree EXAM: CT THORACIC, AND LUMBAR SPINE WITHOUT CONTRAST TECHNIQUE: Multiplanar CT reconstructions of the thoracic  and lumbar spine were generated from contemporary CT of the chest, abdomen and pelvis in the setting of trauma. COMPARISON:  Contemporary CT of the chest, abdomen and pelvis, radiograph of the chest and pelvis FINDINGS: CT THORACIC SPINE FINDINGS Alignment: Mild straightening of the normal lower thoracic kyphosis with slight exaggeration superiorly. Mild atypical widening of the T2-3 and T3-4 spinous processes beyond what is expected for the kyphotic curvature. Vertebrae: Multiple osseous injuries include: *Anterior wedging compression deformity at T1 with 40% height loss anteriorly (AOSpine A1). *Pincer type deformity of T2 with 60% height loss (AOSpine A2). *Incomplete burst fracture T3 superior endplate with up to D34-534 height loss (AO spine A3). 2 mm of retropulsion of the superior endplate fracture fragments. *Fracture of the right transverse process T3 (AO spine A0). *Thin sclerotic bands seen in the superior endplate T5 and T8 and extending from the inferior plate of T6 are indeterminate but could reflect subcortical impaction fracture in the setting of significant trauma. Would be better assessed with MR. *Multiple contiguous bilateral rib fractures better detailed on dedicated CT of the chest from which this study is reconstructed. Paraspinal and other soft tissues: The thickening and likely trace hemorrhage adjacent the T1-T3 compression deformities. No other paraspinal fluid or swelling. No visible canal hematoma. Slight bowing of the supraspinous ligament T2-3. Some adjacent soft tissue stranding though this may be related to posterior contusive changes in a comminuted left scapular fracture. Disc levels: Retropulsion of fracture fragments at T3 result in some minimal effacement of the ventral thecal sac without significant canal stenosis. No other significant narrowing or foraminal stenosis is seen within the thoracic spine. CT LUMBAR SPINE FINDINGS Segmentation: 5 lumbar type vertebrae are present.  Alignment: Slight straightening of the normal lumbar lordosis. Mild retrolisthesis L3 on 4 of approximately 2 mm may be degenerative with some early discogenic change at this level. No associated spondylolysis. Vertebrae: Minimally displaced fractures of the left T1 and T2 transverse processes. No vertebral body fracture or height loss is evident. Multilevel discogenic changes as detailed below.Included portions of the bony pelvis are intact. Paraspinal and other soft tissues: Small amount of thickening in stranding adjacent the left T1 and T2 transverse processes. No over paraspinal thickening swelling or hemorrhage. Some mild contusive changes noted posteriorly. For additional findings in the abdomen and pelvis, please see dedicated CT chest, abdomen and pelvis from which this study is  reconstructed. Disc levels: Multilevel intervertebral disc height loss is present with some mild global disc bulging most pronounced C3-4 and C4-5 where there is resulting mild canal stenosis. A shallow calcified central disc protrusion is noted at L5-S1 without significant resulting stenosis. Aforementioned disc bulges and mild facet degenerative changes result in mild bilateral foraminal narrowing C3-4 and C4-5. IMPRESSION: CT THORACIC SPINE 1. Anterior wedging compression deformity at T1 with 40% height loss anteriorly (AOSpine A1). 2. Pincer type deformity of T2 with 60% height loss (AOSpine A2). 3. Incomplete burst fracture T3 superior endplate with up to D34-534 height loss (AOSpine A3) and of the right transverse process T3 (AOSpine A0). 4. Widening of the interspinous distances T2-3 and T3-4 slightly greater than expected for the degree of kyphosis. Given the vertebral body fractures at these levels, involvement of the posterior tension band is not fully excluded. Consider further evaluation with MRI. 5. Thin sclerotic bands in the superior endplate T5 and T8 and extending from the inferior plate of T6 are indeterminate but  could reflect subcortical impaction fracture in the setting of significant trauma. CT LUMBAR SPINE 1. Nondisplaced fractures of the left L1 and L2 transverse processes. 2. No acute vertebral body fracture or vertebral body height loss identified. 3. Multilevel discogenic changes most pronounced C3-4 and C4-5 with resulting mild canal stenosis. 4. Mild retrolisthesis L3 on 4 may be degenerative with some early discogenic change at this level. For additional findings within the chest, abdomen and pelvis, please see dedicated CT from which this study is reconstructed. These results were called by telephone at the time of interpretation on 04/03/2020 at 10: 27 pm to provider MD Kae Heller of surgery , who verbally acknowledged these results. Electronically Signed   By: Lovena Le M.D.   On: 04/03/2020 23:04   DG Chest Port 1 View  Result Date: 04/04/2020 CLINICAL DATA:  Rib fractures. EXAM: PORTABLE CHEST 1 VIEW COMPARISON:  Chest CT from yesterday FINDINGS: Normal heart size and mediastinal contours. Curvilinear structure along the left descending aorta is presumably atelectasis when compared with recent chest CT. Rib fractures are subtle by radiography. No visible effusion or pneumothorax. IMPRESSION: Mild atelectatic change.  No visible air leak. Electronically Signed   By: Monte Fantasia M.D.   On: 04/04/2020 08:05   DG Chest Port 1 View  Result Date: 04/03/2020 CLINICAL DATA:  [place] 20 feet, short of breath, left-sided chest pain EXAM: PORTABLE CHEST 1 VIEW COMPARISON:  None. FINDINGS: Single frontal view of the chest demonstrates an unremarkable cardiac silhouette. No airspace disease, effusion, or pneumothorax on this supine evaluation. There is a minimally displaced right lateral seventh rib fracture. There is lucency through the infraspinous region of the scapula which may reflect fracture. IMPRESSION: 1. Suspected left scapular fracture. 2. Minimally displaced right lateral seventh rib fracture. 3.  Otherwise no acute intrathoracic process. Electronically Signed   By: Randa Ngo M.D.   On: 04/03/2020 21:50    Anti-infectives: Anti-infectives (From admission, onward)   None      Assessment/Plan: s/p * No surgery found * Advance diet  Continue pulm toilet Pain control for rib fxs T1 wedge fx, T2 pincer type deformity, T3 superior endplate incomplete burst fx with 65mm retropulsion and mild canal stenosis, R T3 TP fx; L1-2 L TP fx, Possible posterior tension band disruption at T2-4: Neurosurgery consulted, Dr. Christella Noa at bedside. Recommends continuing C-Collar, no surgical intervention indicated  Left scapula fx: orthopedic surgery consulted (Dr. Lucia Gaskins)  R 4-11 rib fx, segmental  4-8; L 3-11 rib fx, segmental 9-11, trace R apical pneumothorax, small R effusion, atelectasis/ pulm contusion: pulls less than 500 on IS. 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

## 2020-04-05 NOTE — Progress Notes (Signed)
PT PROGRESS NOTE  Assessment: Pt tolerated treatment well demonstrating significant improvement in activity tolerance, functional mobility, and gait quality. Pt ambulates for limited community distances with use of RW, without signs/symptoms of orthostatics. Pt performs all mobility during this session without physical assistance. Pt does continue to demonstrate the need for supplemental oxygen to maintain stable saturation, needing 5L at rest and 5-6 with mobility at this time. Pt will continue to benefit from aggressive mobilization to further increase activity tolerance and cardiopulmonary function. Pt requires no further DME as pt has access to a RW at home and is mobilizing well enough to not need a 3 in 1 commode at this time. PT continues to recommend HHPT.  04/05/20 1107  PT Visit Information  Last PT Received On 04/05/20  Assistance Needed +1  History of Present Illness 64yo male who sustained a 20' fall from tree at work. He reports shallow breathing since the incident, but developed worsening SOB, chest pain and shoulder pain. Reported pt was originally walking around but became dizzy and lethargic en route to hospital. Pt with hx of smoking crack cocaine. Pt found to have T1 wedge fx, T2 pincer type deformity, T3 superior endplate incomplete burst fx with 62mm retropulsion and mild canal stenosis, R T3 TP fx; L1-2 L TP fx. NSG recommends C-collar, no surgery.  Subjective Data  Subjective pt agreeable to PT session, excited to finally get to eat solid foods today  Patient Stated Goal return to independence  Precautions  Precautions Fall;Cervical  Precaution Booklet Issued No  Precaution Comments verbally reviewed with pt and family  Required Braces or Orthoses Cervical Brace  Cervical Brace Hard collar;At all times  Restrictions  Weight Bearing Restrictions No  Other Position/Activity Restrictions pt able to perform gentle ROM to L shoulder, sling on, WBAT through shoulder  Pain  Assessment  Pain Assessment Faces  Faces Pain Scale 4  Pain Location ribs with mobility  Pain Descriptors / Indicators Grimacing  Pain Intervention(s) Monitored during session  Cognition  Arousal/Alertness Awake/alert  Behavior During Therapy Impulsive  Overall Cognitive Status Impaired/Different from baseline  Area of Impairment Safety/judgement;Awareness;Memory  Memory Decreased recall of precautions  Safety/Judgement Decreased awareness of safety;Decreased awareness of deficits  Awareness Emergent  Bed Mobility  Overal bed mobility Needs Assistance  Bed Mobility Supine to Sit  Supine to sit Supervision;HOB elevated  Transfers  Overall transfer level Needs assistance  Equipment used Rolling walker (2 wheeled)  Transfers Sit to/from Stand  Sit to Stand Supervision  Ambulation/Gait  Ambulation/Gait assistance Supervision  Gait Distance (Feet) 300 Feet  Assistive device Rolling walker (2 wheeled)  Gait Pattern/deviations Step-through pattern  General Gait Details pt with steady step through gait with use of RW  Gait velocity functional  Gait velocity interpretation 1.31 - 2.62 ft/sec, indicative of limited community ambulator  Balance  Overall balance assessment Needs assistance  Sitting-balance support No upper extremity supported;Feet supported  Sitting balance-Leahy Scale Good  Sitting balance - Comments supervision  Standing balance support Bilateral upper extremity supported  Standing balance-Leahy Scale Fair  Standing balance comment supervision  General Comments  General comments (skin integrity, edema, etc.) pt on 5L [place] in room, desaturates to 80s on 4L [place] with mobility (possibly poor reading due to grip of RW), pt saturating 100% on 6L [place] during ambulation after desat on 4L.  PT - End of Session  Equipment Utilized During Treatment Cervical collar  Activity Tolerance Patient tolerated treatment well  Patient left in chair;with call bell/phone  within reach;with  chair alarm set;with family/visitor present  Nurse Communication Mobility status   PT - Assessment/Plan  PT Plan Current plan remains appropriate  PT Visit Diagnosis Other abnormalities of gait and mobility (R26.89);Muscle weakness (generalized) (M62.81);Pain  PT Frequency (ACUTE ONLY) Min 5X/week  Follow Up Recommendations Home health PT;Supervision/Assistance - 24 hour  PT equipment None recommended by PT (pt's mom reports they have access to a RW)  AM-PAC PT "6 Clicks" Mobility Outcome Measure (Version 2)  Help needed turning from your back to your side while in a flat bed without using bedrails? 4  Help needed moving from lying on your back to sitting on the side of a flat bed without using bedrails? 4  Help needed moving to and from a bed to a chair (including a wheelchair)? 4  Help needed standing up from a chair using your arms (e.g., wheelchair or bedside chair)? 4  Help needed to walk in hospital room? 4  Help needed climbing 3-5 steps with a railing?  3  6 Click Score 23  Consider Recommendation of Discharge To: Home with no services  PT Goal Progression  Progress towards PT goals Progressing toward goals  PT Time Calculation  PT Start Time (ACUTE ONLY) 1043  PT Stop Time (ACUTE ONLY) 1107  PT Time Calculation (min) (ACUTE ONLY) 24 min  PT General Charges  $$ ACUTE PT VISIT 1 Visit  PT Treatments  $Gait Training 23-37 mins    Zenaida Niece, PT, DPT Acute Rehabilitation Pager: [phone]

## 2020-04-05 NOTE — Plan of Care (Signed)

## 2020-04-05 NOTE — Progress Notes (Signed)
Patient ID: Jordan [name], male   DOB: 02-03-1956, 64 y.o.   MRN: [id number] BP (!) 142/74   Pulse 79   Temp 98 F (36.7 C) (Oral)   Resp 20   Ht 6' (1.829 m)   Wt 75.8 kg   SpO2 98%   BMI 22.66 kg/m  Awake, oriented x 4 Up walking with PT Moving all extremities well Continue with therapies

## 2020-04-06 ENCOUNTER — Inpatient Hospital Stay (HOSPITAL_COMMUNITY): Payer: Medicare Other

## 2020-04-06 LAB — CBC
HCT: 41.4 % (ref 39.0–52.0)
Hemoglobin: 13.5 g/dL (ref 13.0–17.0)
MCH: 30.2 pg (ref 26.0–34.0)
MCHC: 32.6 g/dL (ref 30.0–36.0)
MCV: 92.6 fL (ref 80.0–100.0)
Platelets: 160 10*3/uL (ref 150–400)
RBC: 4.47 MIL/uL (ref 4.22–5.81)
RDW: 13.9 % (ref 11.5–15.5)
WBC: 5.3 10*3/uL (ref 4.0–10.5)
nRBC: 0 % (ref 0.0–0.2)

## 2020-04-06 LAB — BASIC METABOLIC PANEL
Anion gap: 7 (ref 5–15)
BUN: 22 mg/dL (ref 8–23)
CO2: 23 mmol/L (ref 22–32)
Calcium: 8 mg/dL — ABNORMAL LOW (ref 8.9–10.3)
Chloride: 106 mmol/L (ref 98–111)
Creatinine, Ser: 1.05 mg/dL (ref 0.61–1.24)
GFR calc Af Amer: >60 mL/min (ref >60)
GFR calc non Af Amer: >60 mL/min (ref >60)
Glucose, Bld: 130 mg/dL — ABNORMAL HIGH (ref 70–99)
Potassium: 4.3 mmol/L (ref 3.5–5.1)
Sodium: 136 mmol/L (ref 135–145)

## 2020-04-06 MED ORDER — OXYCODONE HCL 5 MG PO TABS: 5.0000 mg | ORAL_TABLET | Freq: Four times a day (QID) | ORAL | 0 refills | Status: DC | PRN

## 2020-04-06 MED ORDER — AMLODIPINE BESYLATE 5 MG PO TABS: 5.0000 mg | ORAL_TABLET | Freq: Every day | ORAL | Status: DC
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
## 2020-04-06 NOTE — Progress Notes (Signed)
Occupational Therapy Treatment Patient Details Name: Jordan [name] MRN: [id number] DOB: October 31, 1956 Today's Date: 04/06/2020    History of present illness 64yo male who sustained a 20' fall from tree at work. He reports shallow breathing since the incident, but developed worsening SOB, chest pain and shoulder pain. Reported pt was originally walking around but became dizzy and lethargic en route to hospital. Pt with hx of smoking crack cocaine. Pt found to have T1 wedge fx, T2 pincer type deformity, T3 superior endplate incomplete burst fx with 63mm retropulsion and mild canal stenosis, R T3 TP fx; L1-2 L TP fx. NSG recommends C-collar, no surgery.   OT comments  Pt demonstrates tolerance for RA in bed talking energy conservation and then again with basic transfer to chair. Pt does sound to have a rattle to his breathing. Spirometer was encouraged and used during session to produce some small amount of secretions. Pt states "wow that does help" Pt noted to have LOB with transfer to chair by turning head. Pt also with R eye edema that he reports has been occurring off and on for 3 months that starts with a pocket of fluid that moves around his face. Pt reports decrease vision in R eye this session. Pt denies double vision. Pt reports edema lasting 3-4 days normally. Pt states "I want to know why this keeps happening". Pt with social work at end of session. Recommendation for HHOT remains appropriate.   Follow Up Recommendations  Home health OT    Equipment Recommendations  3 in 1 bedside commode    Recommendations for Other Services      Precautions / Restrictions Precautions Precautions: Fall;Cervical Precaution Comments: pt verbalized the doctor took it off. RN confirms that MD d/c ccollar Restrictions LUE Weight Bearing: Weight bearing as tolerated Other Position/Activity Restrictions: per ortho notes able to complete gentle ROM        Mobility Bed Mobility Overal bed mobility:  Needs Assistance Bed Mobility: Supine to Sit     Supine to sit: Supervision     General bed mobility comments: cues to maintain back precautions  Transfers Overall transfer level: Needs assistance   Transfers: Sit to/from Stand Sit to Stand: Min guard              Balance Overall balance assessment: Needs assistance         Standing balance support: No upper extremity supported Standing balance-Leahy Scale: Fair Standing balance comment: pt static standing without support                           ADL either performed or assessed with clinical judgement   ADL Overall ADL's : Needs assistance/impaired                       Lower Body Dressing Details (indicate cue type and reason): Pt is able to demonstrate figure 4 cross in bed. pt has elevated head of bed at home.  Toilet Transfer: Magazine features editor Details (indicate cue type and reason): pt with LOB with head turn          Functional mobility during ADLs: Min guard General ADL Comments: Pt using bil UE to answer phone. pt able to problem solve calling for someone to help him pay for a water bill with OT in the room.      Vision       Perception     Praxis  Cognition Arousal/Alertness: Awake/alert Behavior During Therapy: Impulsive Overall Cognitive Status: Impaired/Different from baseline Area of Impairment: Safety/judgement                     Memory: Decreased recall of precautions     Awareness: Emergent Problem Solving: Slow processing          Exercises     Shoulder Instructions       General Comments RA at this time 95% . pt used spirometer to induce cough that produced secretions. pt educaetd on bracing with pillow. pt able to use [place] to gather secretions.     Pertinent Vitals/ Pain       Pain Assessment: No/denies pain  Home Living                                          Prior Functioning/Environment               Frequency  Min 2X/week        Progress Toward Goals  OT Goals(current goals can now be found in the care plan section)  Progress towards OT goals: Progressing toward goals  Acute Rehab OT Goals Patient Stated Goal: return to independence OT Goal Formulation: With patient Time For Goal Achievement: 04/18/20 Potential to Achieve Goals: Good ADL Goals Pt Will Perform Upper Body Dressing: with modified independence;sitting Pt Will Perform Lower Body Dressing: with modified independence;sitting/lateral leans;sit to/from stand Pt Will Transfer to Toilet: with modified independence;ambulating;regular height toilet Pt Will Perform Tub/Shower Transfer: with modified independence;ambulating;shower seat Pt/caregiver will Perform Home Exercise Program: Increased ROM;Left upper extremity;With written HEP provided Additional ADL Goal #1: Pt will recall and apply 3-5 ECS strategies to BADL routine  Plan Discharge plan remains appropriate    Co-evaluation                 AM-PAC OT "6 Clicks" Daily Activity     Outcome Measure   Help from another person eating meals?: None Help from another person taking care of personal grooming?: A Little Help from another person toileting, which includes using toliet, bedpan, or urinal?: A Little Help from another person bathing (including washing, rinsing, drying)?: A Lot Help from another person to put on and taking off regular upper body clothing?: A Little Help from another person to put on and taking off regular lower body clothing?: A Little 6 Click Score: 18    End of Session    OT Visit Diagnosis: Unsteadiness on feet (R26.81);Other abnormalities of gait and mobility (R26.89);Pain;Other symptoms and signs involving cognitive function Pain - Right/Left: Left Pain - part of body: Shoulder;Arm   Activity Tolerance Patient tolerated treatment well   Patient Left in chair;with call bell/phone within reach;with chair alarm  set;Other (comment)(SW with patient in room)   Nurse Communication Mobility status        Time: [id number] OT Time Calculation (min): 19 min  Charges: OT General Charges $OT Visit: 1 Visit OT Treatments $Self Care/Home Management : 8-22 mins   Brynn, OTR/L  Acute Rehabilitation Services Pager: [phone] Office: [phone] .    Jeri Modena 04/06/2020, 2:55 PM

## 2020-04-06 NOTE — Progress Notes (Signed)
Pt now walking halls without O2, work of breathing much improved. His pain is managed with oral meds. MD notified.

## 2020-04-06 NOTE — Progress Notes (Signed)
Pt d/c'd with his brother to his home where he lives with his mother. He is ambulating well without assist. D/C education given re f/u with Ortho and when/ how to take pain meds. Pt states he understands that he cannot drive or work while he is taking prescription pain med. IVs d/c'd.

## 2020-04-06 NOTE — Progress Notes (Signed)
   Subjective/Chief Complaint: Still with moderate pain on deep breathing and getting up Reports chronic upper eyelid swelling   Objective: Vital signs in last 24 hours: Temp:  [98 F (36.7 C)-98.6 F (37 C)] 98.6 F (37 C) (05/24 0400) Pulse Rate:  [62-83] 73 (05/24 0600) Resp:  [7-20] 9 (05/24 0600) BP: (126-176)/(68-92) 146/82 (05/24 0600) SpO2:  [92 %-99 %] 97 % (05/24 0600) Last BM Date: 04/04/20  Intake/Output from previous day: 05/23 0701 - 05/24 0700 In: 795.5 [I.V.:795.5] Out: -  Intake/Output this shift: No intake/output data recorded.  Exam: Awake and alert Up in a chair Lungs with occ rhonchi bilaterally Right eyelid with some edema CT mildly tachy Abdomen soft, NT  Lab Results:  Recent Labs    04/03/20 2142 04/04/20 0456  WBC 7.9 6.7  HGB 15.5 14.7  HCT 46.7 43.7  PLT 167 151   BMET Recent Labs    04/03/20 2142 04/04/20 0456  NA 134* 136  K 4.2 4.1  CL 98 106  CO2 24 22  GLUCOSE 149* 117*  BUN 16 14  CREATININE 1.20 1.00  CALCIUM 8.4* 8.2*   PT/INR Recent Labs    04/03/20 2142  LABPROT 12.9  INR 1.0   ABG No results for input(s): PHART, HCO3 in the last 72 hours.  Invalid input(s): PCO2, PO2  Studies/Results: No results found.  Anti-infectives: Anti-infectives (From admission, onward)   None      Assessment/Plan: S/p fall from tree  T1 wedge fx, T2 pincer type deformity, T3 superior endplate incomplete burst fx with 81mm retropulsion and mild canal stenosis, R T3 TP fx; L1-2 L TP fx, Possible posterior tension band disruption at T2-4  R 4-11 rib fx, segmental 4-8; L 3-11 rib fx, segmental 9-11, trace R apical pneumothorax, small R effusion, atelectasis/ pulm contusion:  Plan:   Check chest xray Pulmonary toilet Physical therapy Pain control Labs pending  LOS: 3 days    Coralie Keens 04/06/2020

## 2020-04-09 NOTE — Discharge Summary (Signed)
Patient ID: Jordan [name] [id number] Jul 25, 1956 64 y.o.  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 Coronary artery calcifications  Discharge Diagnosis 20 foot fall from a tree  T1 wedge fx T2 pincer type deformity T3 superior endplate incomplete burst fx with 36mm retropulsion and mild canal stenosis R T3 TP f L1-2 L TP fx Possible posterior tension band disruption at T2-4 Left scapula fx R 4-11 rib fx L 3-11 rib fx Trace R apical pneumothorax Small R effusion, atelectasis/ pulm contusion Small volume free fluid in the pelvis  Incidental findings include but not limited to:  Multileval c/t/l spine degenerative changes Aortic atherosclerosis, 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  He continued to work until the end of his day and then went home.  He reports shallow breathing since the incident, but developed worsening shortness of breath, chest pain and shoulder pain and contacted EMS.  By report he was originally walking around but became dizzy and more lethargic en route.  He was hypertensive up to [id number] systolic on route, sats have remained in the mid 90s, no tachycardia.  He does smoke crack cocaine and has done so since his  fall today.  Procedures None   Hospital Course:  Patient was admitted to the trauma service for above diagnoses. Neurosurgery was consulted recommended non-op, cervical collar and follow up in the office. Orthopedics was consulted and recommended non-op, sling for comfort, and follow up in the office. Repeat CXR showed no evidence of PTX. Patient worked with therapies who recommended [place]. Per notes, ambulation improved and patient was walking in halls on 5/24 without o2. Patient was discharged home with below follow up.  I was not directly involved in this patient's care and did not see the patient during their hospital stay, therefore the information in this discharge summary was taken entirely from the chart.  I called the patient on 5/27 at 10:34 a.m. 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 Patient does not have a PCP. I discussed with him to call the number on his Medicare card to establish care with a PCP. I also gave him [place] and wellness number. I gave him the number for Vascular Surgery as a referral. I recommended follow up with Neurosurgery for his fx's and provided their phone number. I also provided the patient with [facility] offices number incase he has any further questions or concerns. I reiterated that he is to follow up with Dr. Lucia Gaskins of Orthopedics. He reports he has already made a follow up with Dr. Lucia Gaskins.   Physical Exam: I was not directly involved in this patient's care and did not see the patient during  their hospital stay. Please see MD's progress note for Physical Exam.   Allergies as of 04/06/2020   No Known Allergies     Medication List    TAKE these medications   ibuprofen 200 MG tablet Commonly known as: ADVIL Take 200 mg by mouth every 6 (six) hours as needed for headache or moderate pain.   oxyCODONE 5 MG immediate release tablet Commonly  known as: Oxy IR/ROXICODONE Take 1 tablet (5 mg total) by mouth every 6 (six) hours as needed for severe pain.        Follow-up Information    Erle Crocker, MD. Schedule an appointment as soon as possible for a visit in 3 weeks.   Specialty: Orthopedic Surgery Contact information: [place] [address] [phone]         Please see above for phone conversation with patient for follow up   Signed: Alferd Apa, [facility] Surgery 04/09/2020, 10:27 AM Please see Amion for pager number during day hours 7:00am-4:30pm

## 2020-04-15 NOTE — Progress Notes (Signed)
Patient ID: Jordan [name], male   DOB: 09/29/1956, 64 y.o.   MRN: [id number]  Virtual Visit via Telephone Note  I connected with Jordan [name] on 04/16/20 at  3:30 PM EDT by telephone and verified that I am speaking with the correct person using two identifiers.   I discussed the limitations, risks, security and privacy concerns of performing an evaluation and management service by telephone and the availability of in person appointments. I also discussed with the patient that there may be a patient responsible charge related to this service. The patient expressed understanding and agreed to proceed.  PATIENT visit by telephone virtually in the context of Covid-19 pandemic. Patient location:  home My Location:  [facility] office Persons on the call:  Me and the patient   History of Present Illness: After hospitalization 5/21-5/24/2021 after a fall and sustaining multiple fractures.  He has f/up with Dr Lucia Gaskins on June 14.  Sees Dr Megan Salon June 17th.  Running out of pain meds.  No fever.  No SOB.  Appetite is good.  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 infrarenal AAA Coronary artery calcifications  Discharge Diagnosis 20 foot fall from a tree  T1 wedge fx T2 pincer type deformity T3 superior endplate incomplete burst fx with 55mm retropulsion and mild canal stenosis R T3 TP f L1-2 L TP fx Possible posterior tension band disruption at T2-4 Left scapula fx R 4-11 rib  fx L 3-11 rib fx Trace R apical pneumothorax Small R effusion, atelectasis/ pulm contusion Small volume free fluid in the pelvis  Incidental findings include but not limited to:  Multileval c/t/l spine degenerative changes Aortic atherosclerosis, 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 He reports shallow breathing since the incident,but developed worsening shortness of breath, chest pain and shoulder pain and contacted EMS. By report he was originally walking around but became dizzy and more lethargic en route. He was hypertensive up to A999333 systolic on route, sats have remained in the mid 90s, no tachycardia.  He does smoke crack cocaine and has done so since his fall today.  Procedures None   Hospital Course:  Patient was admitted to the trauma service for above diagnoses. Neurosurgery was consulted recommended non-op, cervical collar and follow up in the office. Orthopedics was consulted and recommended non-op, sling for comfort, and follow up in the office. Repeat CXR showed no evidence of PTX. Patient worked with therapies who recommended [place]. Per notes, ambulation improved and patient was walking in halls on 5/24 without o2. Patient was discharged home with below follow up.  I was not directly involved in this patient's care and did not see the patient during their hospital stay, therefore the information in this discharge summary was taken entirely from the chart.  I called the patient on 5/27 at 10:34 a.m. 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 Patient does not have a PCP. I discussed with him to  call the number on his Medicare card to establish care with a PCP. I also gave him [place] and wellness number. I gave him the number for Vascular Surgery as a referral. I recommended follow up with Neurosurgery for his fx's and provided their phone number. I also provided the patient with [facility] offices number incase he has any further questions or concerns. I reiterated that he is to follow up with Dr. Lucia Gaskins of Orthopedics. He reports he has already made a follow up with Dr. Lucia Gaskins.    Observations/Objective:  NAD.  Patient expresses anger "at system."   Assessment and Plan: 1. Fall, sequela - traMADol (ULTRAM) 50 MG tablet; Take 1 tablet (50 mg total) by mouth every 6 (six) hours as needed for up to 5 days.  Dispense: 30 tablet; Refill: 0  2. Closed wedge compression fracture of T1 vertebra with routine healing, subsequent encounter - traMADol (ULTRAM) 50 MG tablet; Take 1 tablet (50 mg total) by mouth every 6 (six) hours as needed for up to 5 days.  Dispense: 30 tablet; Refill: 0  3. Multiple closed fractures of ribs of both sides with delayed healing, subsequent encounter - traMADol (ULTRAM) 50 MG tablet; Take 1 tablet (50 mg total) by mouth every 6 (six) hours as needed for up to 5 days.  Dispense: 30 tablet; Refill: 0  My nurse is calling to see if we can get him appts sooner with ortho and neurosurgery.  I have advised him to go to ED/UC if anything worsens or pain is not manageable.  Patient verbalizes understanding.   Follow Up Instructions: No f/up labs today;  Has appt 6/16 to establish care   I discussed the assessment and treatment plan with the patient. The patient was provided an opportunity to ask questions and all were answered. The patient agreed with the plan and demonstrated an understanding of the instructions.   The patient was advised to call back or seek an in-person evaluation if the symptoms worsen or if the condition fails to improve as anticipated.  I provided 16  minutes of non-face-to-face time during this encounter.   Freeman Caldron, PA-C

## 2020-04-16 ENCOUNTER — Other Ambulatory Visit: Payer: Self-pay

## 2020-04-16 ENCOUNTER — Ambulatory Visit: Payer: Medicare Other | Attending: Critical Care Medicine | Admitting: Physician Assistant

## 2020-04-16 DIAGNOSIS — S22010D Wedge compression fracture of first thoracic vertebra, subsequent encounter for fracture with routine healing: Secondary | ICD-10-CM

## 2020-04-16 DIAGNOSIS — W19XXXS Unspecified fall, sequela: Secondary | ICD-10-CM

## 2020-04-16 DIAGNOSIS — S2243XG Multiple fractures of ribs, bilateral, subsequent encounter for fracture with delayed healing: Secondary | ICD-10-CM

## 2020-04-16 MED ORDER — TRAMADOL HCL 50 MG PO TABS: 50.0000 mg | ORAL_TABLET | Freq: Four times a day (QID) | ORAL | 0 refills | Status: AC | PRN

## 2020-04-29 ENCOUNTER — Ambulatory Visit: Payer: Medicare Other | Admitting: Critical Care Medicine

## 2020-04-29 NOTE — Progress Notes (Deleted)
Subjective:    Patient ID: Jordan [name], male    DOB: 10-01-1956, 64 y.o.   MRN: [id number]  64 y.o.M s/p fall with multiple fx and admission. Had 04/2020 telephone visit with our PA:  After hospitalization 5/21-5/24/2021 after a fall and sustaining multiple fractures.  He has f/up with Dr Lucia Gaskins on June 14.  Sees Dr Megan Salon June 17th.  Running out of pain meds.  No fever.  No SOB.  Appetite is good.  From discharge summary:  Admitting Diagnosis: 20 foot fall from a tree  T1 wedge fx T2 pincer type deformity T3 superior endplate incomplete burst fx with 37mm retropulsion and mild canal stenosis R T3 TP f L1-2 L TP fx Possible posterior tension band disruption at T2-4 Left scapula fx R 4-11 rib fx L 3-11 rib fx Trace R apical pneumothorax Small R effusion, atelectasis/ pulm contusion Small volume free fluid in the pelvis  Incidental findings include but not limited to:  Multileval c/t/l spine degenerative changes Aortic atherosclerosis, 3.4cm infrarenal AAA Coronary artery calcifications  Discharge Diagnosis 20 foot fall from a tree  T1 wedge fx T2 pincer type deformity T3 superior endplate incomplete burst fx with 27mm retropulsion and mild canal stenosis R T3 TP f L1-2 L TP fx Possible posterior tension band disruption at T2-4 Left scapula fx R 4-11 rib fx L 3-11 rib fx Trace R apical pneumothorax Small R effusion, atelectasis/ pulm contusion Small volume free fluid in the pelvis  Incidental findings include but not limited to:  Multileval c/t/l spine degenerative changes Aortic atherosclerosis, 3.4cm infrarenal AAA Coronary artery calcifications  Consultants Orthopedics  Neurosurgery  H&P: 64yomale who sustained a approximately55foot fall from a tree while at work today around 12 PM (9+hours prior to presentation).He continued to work until the end of his day and then went home. He reports shallow breathing since the incident,but  developed worsening shortness of breath, chest pain and shoulder pain and contacted EMS. By report he was originally walking around but became dizzy and more lethargic en route. He was hypertensive up to 242 systolic on route, sats have remained in the mid 90s, no tachycardia.  He does smoke crack cocaine and has done so since his fall today.  Procedures None  Hospital Course: Patient was admitted to the trauma service for above diagnoses. Neurosurgery was consulted recommended non-op, cervical collar and follow up in the office. Orthopedics was consulted and recommended non-op, sling for comfort, and follow up in the office. Repeat CXR showed no evidence of PTX. Patient worked with therapies who recommended [place]. Per notes, ambulation improved and patient was walking in halls on 5/24 without o2. Patient was discharged home with below follow up.  Iwas not directly involved in this patient's care and did not see the patient during their hospital stay, therefore the information in this discharge summary was taken entirely from the chart.  I called the patient on 5/27 at 10:34 a.m. to notify him that we recommend follow up with his PCP for incidental3.4cm infrarenal AAAseen on CTRecommend followup by abdomen and pelvis CTA in 6 months. Patient does not have a PCP. I discussed with him to call the number on his Medicare card to establish care with a PCP. I also gave him [place] and wellness number. I gave him the number for Vascular Surgery as a referral. I recommended follow up with Neurosurgery for his fx's and provided their phone number. I also provided the patient with [facility] offices number incase  he has any further questions or concerns. I reiterated that he is to follow up with Dr. Lucia Gaskins of Orthopedics. He reports he has already made a follow up with Dr. Lucia Gaskins.  Observations/Objective:  NAD.  Patient expresses anger "at system."   Assessment and Plan: 1. Fall, sequela -  traMADol (ULTRAM) 50 MG tablet; Take 1 tablet (50 mg total) by mouth every 6 (six) hours as needed for up to 5 days.  Dispense: 30 tablet; Refill: 0  2. Closed wedge compression fracture of T1 vertebra with routine healing, subsequent encounter - traMADol (ULTRAM) 50 MG tablet; Take 1 tablet (50 mg total) by mouth every 6 (six) hours as needed for up to 5 days.  Dispense: 30 tablet; Refill: 0  3. Multiple closed fractures of ribs of both sides with delayed healing, subsequent encounter - traMADol (ULTRAM) 50 MG tablet; Take 1 tablet (50 mg total) by mouth every 6 (six) hours as needed for up to 5 days.  Dispense: 30 tablet; Refill: 0  My nurse is calling to see if we can get him appts sooner with ortho and neurosurgery.  I have advised him to go to ED/UC if anything worsens or pain is not manageable.  Patient verbalizes understanding.      Review of Systems     Objective:   Physical Exam        Assessment & Plan:

## 2020-04-30 DIAGNOSIS — S22000D Wedge compression fracture of unspecified thoracic vertebra, subsequent encounter for fracture with routine healing: Secondary | ICD-10-CM

## 2020-04-30 HISTORY — DX: Wedge compression fracture of unspecified thoracic vertebra, subsequent encounter for fracture with routine healing: S22.000D

## 2020-06-04 ENCOUNTER — Ambulatory Visit (HOSPITAL_COMMUNITY)
Admission: RE | Admit: 2020-06-04 | Discharge: 2020-06-04 | Disposition: A | Discharge status: Home / Self Care | Payer: Medicare Other | Source: Ambulatory Visit | Attending: Critical Care Medicine | Admitting: Critical Care Medicine

## 2020-06-04 ENCOUNTER — Other Ambulatory Visit: Payer: Self-pay

## 2020-06-04 ENCOUNTER — Ambulatory Visit (HOSPITAL_BASED_OUTPATIENT_CLINIC_OR_DEPARTMENT_OTHER): Payer: Medicare Other | Admitting: Pharmacist

## 2020-06-04 ENCOUNTER — Ambulatory Visit: Payer: Medicare Other | Admitting: Critical Care Medicine

## 2020-06-04 ENCOUNTER — Ambulatory Visit (HOSPITAL_COMMUNITY): Payer: Medicare Other

## 2020-06-04 ENCOUNTER — Encounter: Payer: Self-pay | Admitting: Critical Care Medicine

## 2020-06-04 VITALS — BP 153/76 | HR 73 | Temp 98.0°F | Resp 16 | Ht 71.0 in | Wt 164.0 lb

## 2020-06-04 DIAGNOSIS — Z23 Encounter for immunization: Secondary | ICD-10-CM | POA: Diagnosis not present

## 2020-06-04 DIAGNOSIS — J449 Chronic obstructive pulmonary disease, unspecified: Secondary | ICD-10-CM | POA: Insufficient documentation

## 2020-06-04 DIAGNOSIS — Z1211 Encounter for screening for malignant neoplasm of colon: Secondary | ICD-10-CM | POA: Diagnosis not present

## 2020-06-04 DIAGNOSIS — R030 Elevated blood-pressure reading, without diagnosis of hypertension: Secondary | ICD-10-CM

## 2020-06-04 DIAGNOSIS — I10 Essential (primary) hypertension: Secondary | ICD-10-CM | POA: Insufficient documentation

## 2020-06-04 DIAGNOSIS — R05 Cough: Secondary | ICD-10-CM | POA: Insufficient documentation

## 2020-06-04 DIAGNOSIS — R0609 Other forms of dyspnea: Secondary | ICD-10-CM

## 2020-06-04 DIAGNOSIS — R0600 Dyspnea, unspecified: Secondary | ICD-10-CM | POA: Insufficient documentation

## 2020-06-04 DIAGNOSIS — R053 Chronic cough: Secondary | ICD-10-CM

## 2020-06-04 DIAGNOSIS — R768 Other specified abnormal immunological findings in serum: Secondary | ICD-10-CM

## 2020-06-04 DIAGNOSIS — I2510 Atherosclerotic heart disease of native coronary artery without angina pectoris: Secondary | ICD-10-CM | POA: Diagnosis not present

## 2020-06-04 DIAGNOSIS — S22010D Wedge compression fracture of first thoracic vertebra, subsequent encounter for fracture with routine healing: Secondary | ICD-10-CM | POA: Insufficient documentation

## 2020-06-04 DIAGNOSIS — W14XXXD Fall from tree, subsequent encounter: Secondary | ICD-10-CM | POA: Diagnosis not present

## 2020-06-04 DIAGNOSIS — Z87891 Personal history of nicotine dependence: Secondary | ICD-10-CM | POA: Diagnosis not present

## 2020-06-04 DIAGNOSIS — S2243XG Multiple fractures of ribs, bilateral, subsequent encounter for fracture with delayed healing: Secondary | ICD-10-CM | POA: Diagnosis not present

## 2020-06-04 DIAGNOSIS — T07XXXA Unspecified multiple injuries, initial encounter: Secondary | ICD-10-CM

## 2020-06-04 HISTORY — DX: Unspecified multiple injuries, initial encounter: T07.XXXA

## 2020-06-04 MED ORDER — PROAIR RESPICLICK 108 (90 BASE) MCG/ACT IN AEPB
2.0000 | INHALATION_SPRAY | Freq: Four times a day (QID) | RESPIRATORY_TRACT | 4 refills | Status: DC | PRN
Start: 2020-06-04 — End: 2020-11-23

## 2020-06-04 NOTE — Patient Instructions (Addendum)
Please stay off tobacco products A prevnar pneumonia vaccine and tetanus vaccine was given Start Albuterol 2 puff every 6 hours as needed for shortness of breath  A Chest xray will be obtained Monitor your blood pressure at home , follow a healthy diet as below Labs: hepatitis studies, a cologuard will be ordered  Return 2 months Dr Joya Gaskins    Preventing Hypertension Hypertension, commonly called high blood pressure, is when the force of blood pumping through the arteries is too strong. Arteries are blood vessels that carry blood from the heart throughout the body. Over time, hypertension can damage the arteries and decrease blood flow to important parts of the body, including the brain, heart, and kidneys. Often, hypertension does not cause symptoms until blood pressure is very high. For this reason, it is important to have your blood pressure checked on a regular basis. Hypertension can often be prevented with diet and lifestyle changes. If you already have hypertension, you can control it with diet and lifestyle changes, as well as medicine. What nutrition changes can be made? Maintain a healthy diet. This includes:  Eating less salt (sodium). Ask your health care provider how much sodium is safe for you to have. The general recommendation is to consume less than 1 tsp (2,300 mg) of sodium a day. ? Do not add salt to your food. ? Choose low-sodium options when grocery shopping and eating out.  Limiting fats in your diet. You can do this by eating low-fat or fat-free dairy products and by eating less red meat.  Eating more fruits, vegetables, and whole grains. Make a goal to eat: ? 1-2 cups of fresh fruits and vegetables each day. ? 3-4 servings of whole grains each day.  Avoiding foods and beverages that have added sugars.  Eating fish that contain healthy fats (omega-3 fatty acids), such as mackerel or salmon. If you need help putting together a healthy eating plan, try the DASH  diet. This diet is high in fruits, vegetables, and whole grains. It is low in sodium, red meat, and added sugars. DASH stands for Dietary Approaches to Stop Hypertension. What lifestyle changes can be made?   Lose weight if you are overweight. Losing just 3?5% of your body weight can help prevent or control hypertension. ? For example, if your present weight is 200 lb (91 kg), a loss of 3-5% of your weight means losing 6-10 lb (2.7-4.5 kg). ? Ask your health care provider to help you with a diet and exercise plan to safely lose weight.  Get enough exercise. Do at least 150 minutes of moderate-intensity exercise each week. ? You could do this in short exercise sessions several times a day, or you could do longer exercise sessions a few times a week. For example, you could take a brisk 10-minute walk or bike ride, 3 times a day, for 5 days a week.  Find ways to reduce stress, such as exercising, meditating, listening to music, or taking a yoga class. If you need help reducing stress, ask your health care provider.  Do not smoke. This includes e-cigarettes. Chemicals in tobacco and nicotine products raise your blood pressure each time you smoke. If you need help quitting, ask your health care provider.  Avoid alcohol. If you drink alcohol, limit alcohol intake to no more than 1 drink a day for nonpregnant women and 2 drinks a day for men. One drink equals 12 oz of beer, 5 oz of wine, or 1 oz of hard liquor. Why  are these changes important? Diet and lifestyle changes can help you prevent hypertension, and they may make you feel better overall and improve your quality of life. If you have hypertension, making these changes will help you control it and help prevent major complications, such as:  Hardening and narrowing of arteries that supply blood to: ? Your heart. This can cause a heart attack. ? Your brain. This can cause a stroke. ? Your kidneys. This can cause kidney failure.  Stress on your  heart muscle, which can cause heart failure. What can I do to lower my risk?  Work with your health care provider to make a hypertension prevention plan that works for you. Follow your plan and keep all follow-up visits as told by your health care provider.  Learn how to check your blood pressure at home. Make sure that you know your personal target blood pressure, as told by your health care provider. How is this treated? In addition to diet and lifestyle changes, your health care provider may recommend medicines to help lower your blood pressure. You may need to try a few different medicines to find what works best for you. You also may need to take more than one medicine. Take over-the-counter and prescription medicines only as told by your health care provider. Where to find support Your health care provider can help you prevent hypertension and help you keep your blood pressure at a healthy level. Your local hospital or your community may also provide support services and prevention programs. The American Heart Association offers an online support network at: CheapBootlegs.com.cy Where to find more information Learn more about hypertension from:  [place], Lung, and Blood Institute: ElectronicHangman.is  Centers for Disease Control and Prevention: https://ingram.com/  American Academy of Family Physicians: http://familydoctor.org/familydoctor/en/diseases-conditions/high-blood-pressure.printerview.all.html Learn more about the DASH diet from:  [place], Lung, and [place]: https://www.reyes.com/ Contact a health care provider if:  You think you are having a reaction to medicines you have taken.  You have recurrent headaches or feel dizzy.  You have swelling in your ankles.  You have trouble with your vision. Summary  Hypertension often does not cause any symptoms until  blood pressure is very high. It is important to get your blood pressure checked regularly.  Diet and lifestyle changes are the most important steps in preventing hypertension.  By keeping your blood pressure in a healthy range, you can prevent complications like heart attack, heart failure, stroke, and kidney failure.  Work with your health care provider to make a hypertension prevention plan that works for you. This information is not intended to replace advice given to you by your health care provider. Make sure you discuss any questions you have with your health care provider. Document Revised: 02/22/2019 Document Reviewed: 07/11/2016 Elsevier Patient Education  2020 [employer].

## 2020-06-04 NOTE — Assessment & Plan Note (Signed)
Dyspnea on exertion in addition to the chronic cough may well be chronic lung disease obtain chest x-ray give albuterol

## 2020-06-04 NOTE — Assessment & Plan Note (Signed)
Chronic cough on the basis of likely mild COPD  Will obtain chest x-ray and also begin albuterol as needed patient is currently stop smoking

## 2020-06-04 NOTE — Progress Notes (Signed)
Subjective:    Patient ID: Jordan [name], male    DOB: [birth year], 64 y.o.   MRN: [id number]  Here to est care pcp  Patient is here to establish care and previously seen by her physician assistant in June after admission in May for multiple trauma after a fall from 20 feet while trying to trim a tree.  See documentation from previous visit After hospitalization 5/21-5/24/2021 after a fall and sustaining multiple fractures.  He has f/up with Dr Lucia Gaskins on June 14.  Sees Dr Megan Salon June 17th.  Running out of pain meds.  No fever.  No SOB.  Appetite is good.  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 Coronary artery calcifications  Discharge Diagnosis 20 foot fall from a tree  T1 wedge fx T2 pincer type deformity T3 superior endplate incomplete burst fx with 34mm retropulsion and mild canal stenosis R T3 TP f L1-2 L TP fx Possible posterior tension band disruption at T2-4 Left scapula fx R 4-11 rib fx L 3-11 rib fx Trace R apical pneumothorax Small R effusion, atelectasis/ pulm contusion Small volume free fluid in the pelvis  Incidental findings include but not limited to:  Multileval c/t/l spine degenerative changes Aortic atherosclerosis, 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 He reports shallow breathing since the incident,but developed worsening shortness of breath, chest pain and shoulder pain and contacted EMS. By report he was originally walking around but became dizzy and more lethargic en route. He was hypertensive up to 009 systolic on route, sats have remained in the mid 90s, no tachycardia.  He does smoke crack cocaine and has done so since his fall today.  Procedures None  Hospital Course: Patient was admitted to the trauma service for above diagnoses. Neurosurgery was consulted recommended non-op, cervical collar and follow up in the office. Orthopedics was consulted and recommended non-op, sling for comfort, and follow up in the office. Repeat CXR showed no evidence of PTX. Patient worked with therapies who recommended [place]. Per notes, ambulation improved and patient was walking in halls on 5/24 without o2. Patient was discharged home with below follow up.  Iwas not directly involved in this patient's care and did not see the patient during their hospital stay, therefore the information in this discharge summary was taken entirely from the chart.  I called the patient on 5/27 at 10:34 a.m. to notify him that we recommend follow up with his PCP for incidental3.4cm infrarenal AAAseen on CTRecommend followup by abdomen and pelvis CTA in 6 months. Patient does not have a PCP. I discussed with him to call the number on his Medicare card to establish care with a PCP. I also gave him [place] and wellness number. I gave him the number for  Vascular Surgery as a referral. I recommended follow up with Neurosurgery for his fx's and provided their phone number. I also provided the patient with [facility] offices number incase he has any further questions or concerns. I reiterated that he is to follow up with Dr. Lucia Gaskins of Orthopedics. He reports he has already made a  follow up with Dr. Lucia Gaskins.  Observations/Objective:  NAD.  Patient expresses anger "at system."   Assessment and Plan: 1. Fall, sequela - traMADol (ULTRAM) 50 MG tablet; Take 1 tablet (50 mg total) by mouth every 6 (six) hours as needed for up to 5 days.  Dispense: 30 tablet; Refill: 0  2. Closed wedge compression fracture of T1 vertebra with routine healing, subsequent encounter - traMADol (ULTRAM) 50 MG tablet; Take 1 tablet (50 mg total) by mouth every 6 (six) hours as needed for up to 5 days.  Dispense: 30 tablet; Refill: 0  3. Multiple closed fractures of ribs of both sides with delayed healing, subsequent encounter - traMADol (ULTRAM) 50 MG tablet; Take 1 tablet (50 mg total) by mouth every 6 (six) hours as needed for up to 5 days.  Dispense: 30 tablet; Refill: 0  My nurse is calling to see if we can get him appts sooner with ortho and neurosurgery.  I have advised him to go to ED/UC if anything worsens or pain is not manageable.  Patient verbalizes understanding.   Patient currently is doing much better he has had follow-up with orthopedic he is now off all pain medication his fractures are nonrevealing.  He is more ambulatory.  He is back at work.  The patient has a former history of heavy alcohol use but has not had any alcohol use since the early 2000's.  He smoked heavily but has quit smoking since his injuries. Patient does note he has daily cough and some chronic dyspnea on exertion       Review of Systems  HENT: Positive for dental problem.   Respiratory: Positive for cough, chest tightness, shortness of breath and wheezing.   Cardiovascular: Negative for chest pain, palpitations and leg swelling.  Gastrointestinal: Negative.        Objective:   Physical Exam Vitals:   06/04/20 0901  BP: (!) 153/76  Pulse: 73  Resp: 16  Temp: 98 F (36.7 C)  SpO2: 97%  Weight: 164 lb (74.4 kg)  Height: 5\' 11"  (1.803 m)    Gen: Pleasant, well-nourished, in no  distress,  Jordan affect  ENT: No lesions,  mouth clear,  oropharynx clear, no postnasal drip  Neck: No JVD, no TMG, no carotid bruits  Lungs: No use of accessory muscles, no dullness to percussion, few scattered rhonchi and prolonged expiratory phase Cardiovascular: RRR, heart sounds Jordan, no murmur or gallops, no peripheral edema  Abdomen: soft and NT, no HSM,  BS Jordan  Musculoskeletal: No deformities, no cyanosis or clubbing  Neuro: alert, non focal  Skin: Warm, no lesions or rashes  All imaging studies and previous documentations at [facility] health link I reviewed       Assessment & Plan:  I personally reviewed all images and lab data in the [facility] system as well as any outside material available during this office visit and agree with the  radiology impressions.   Multiple fractures History of multiple fractures from fall now all healing  Chronic cough Chronic cough on the basis of likely mild COPD  Will obtain chest x-ray and also begin albuterol as needed patient is currently  stop smoking  Hepatitis C antibody positive in blood History of prior hepatitis C antibody positive we will follow-up with quantitative RNA levels  Elevated blood pressure reading Elevated blood pressure reading is isolated will follow for now he will monitor blood pressure at home  Dyspnea on exertion Dyspnea on exertion in addition to the chronic cough may well be chronic lung disease obtain chest x-ray give albuterol  History of tobacco use Patient is currently not smoking tobacco counseling given   Diagnoses and all orders for this visit:  Chronic cough -     DG Chest 2 View; Future  Dyspnea on exertion -     DG Chest 2 View; Future  Colon cancer screening -     Cologuard  Hepatitis C antibody positive in blood -     Hepatitis C antibody -     HCV RNA quant  Elevated blood pressure reading  Multiple fractures  History of tobacco use  Other orders -     Albuterol Sulfate  (PROAIR RESPICLICK) 357 (90 Base) MCG/ACT AEPB; Inhale 2 puffs into the lungs every 6 (six) hours as needed.

## 2020-06-04 NOTE — Assessment & Plan Note (Signed)
History of multiple fractures from fall now all healing

## 2020-06-04 NOTE — Progress Notes (Signed)
Patient presents for vaccination against strep pneumo and tetanus per orders of Dr. Joya Gaskins. Consent given. Counseling provided. No contraindications exists. Vaccine administered without incident.

## 2020-06-04 NOTE — Assessment & Plan Note (Signed)
History of prior hepatitis C antibody positive we will follow-up with quantitative RNA levels

## 2020-06-04 NOTE — Assessment & Plan Note (Signed)
Patient is currently not smoking tobacco counseling given

## 2020-06-04 NOTE — Assessment & Plan Note (Signed)
Elevated blood pressure reading is isolated will follow for now he will monitor blood pressure at home

## 2020-06-04 NOTE — Progress Notes (Signed)
Here to establish care

## 2020-06-05 LAB — HCV RNA QUANT
HCV log10: 4.632 log10 IU/mL
Hepatitis C Quantitation: 42900 IU/mL

## 2020-06-05 LAB — HEPATITIS C ANTIBODY: Hep C Virus Ab: >11 s/co ratio — ABNORMAL HIGH (ref 0.0–0.9)

## 2020-06-09 ENCOUNTER — Other Ambulatory Visit: Payer: Self-pay | Admitting: Critical Care Medicine

## 2020-07-24 ENCOUNTER — Telehealth: Payer: Self-pay

## 2020-07-24 NOTE — Telephone Encounter (Signed)
Attempt to reach patient regarding if the patient receive the cologuard kit, and if he has mail it out back. No answer and LVM.

## 2020-08-11 ENCOUNTER — Ambulatory Visit: Payer: Medicare Other | Attending: Critical Care Medicine | Admitting: Critical Care Medicine

## 2020-08-11 ENCOUNTER — Other Ambulatory Visit: Payer: Self-pay

## 2020-08-11 ENCOUNTER — Other Ambulatory Visit: Payer: Self-pay | Admitting: Critical Care Medicine

## 2020-08-11 ENCOUNTER — Encounter: Payer: Self-pay | Admitting: Critical Care Medicine

## 2020-08-11 VITALS — BP 186/99 | HR 72 | Temp 98.3°F | Resp 16 | Wt 161.8 lb

## 2020-08-11 DIAGNOSIS — J449 Chronic obstructive pulmonary disease, unspecified: Secondary | ICD-10-CM | POA: Diagnosis not present

## 2020-08-11 DIAGNOSIS — R7989 Other specified abnormal findings of blood chemistry: Secondary | ICD-10-CM | POA: Diagnosis not present

## 2020-08-11 DIAGNOSIS — I10 Essential (primary) hypertension: Secondary | ICD-10-CM | POA: Diagnosis not present

## 2020-08-11 DIAGNOSIS — N529 Male erectile dysfunction, unspecified: Secondary | ICD-10-CM | POA: Diagnosis not present

## 2020-08-11 DIAGNOSIS — Z87891 Personal history of nicotine dependence: Secondary | ICD-10-CM

## 2020-08-11 MED ORDER — AMLODIPINE BESYLATE 10 MG PO TABS
10.0000 mg | ORAL_TABLET | Freq: Every day | ORAL | 3 refills | Status: DC
Start: 2020-08-11 — End: 2020-10-27

## 2020-08-11 MED ORDER — SILDENAFIL CITRATE 50 MG PO TABS: 50.0000 mg | ORAL_TABLET | Freq: Every day | ORAL | 1 refills | Status: DC | PRN

## 2020-08-11 MED ORDER — BUDESONIDE-FORMOTEROL FUMARATE 160-4.5 MCG/ACT IN AERO
2.0000 | INHALATION_SPRAY | Freq: Two times a day (BID) | RESPIRATORY_TRACT | 12 refills | Status: DC
Start: 2020-08-11 — End: 2020-11-23

## 2020-08-11 NOTE — Patient Instructions (Signed)
Liver function lab test ordered today  Start amlodipine one daily for elevated blood pressure  Start symbicort two puff twice a day for breathing  Medications sent to your [facility]  Return in two months for blood pressure check  See a dentist of your choice  Please send in your cologuard test

## 2020-08-11 NOTE — Assessment & Plan Note (Signed)
History of hepatitis C antibody but no significant viral load will refollow-up liver function test

## 2020-08-11 NOTE — Assessment & Plan Note (Signed)
Hypertension on the basis of primary hypertension plan is to begin amlodipine 10 mg daily

## 2020-08-11 NOTE — Assessment & Plan Note (Signed)
Plan is to prescribe 50 mg of Viagra as needed

## 2020-08-11 NOTE — Assessment & Plan Note (Signed)
Patient congratulated on having ceased smoking

## 2020-08-11 NOTE — Assessment & Plan Note (Signed)
Chest x-ray and exam compatible with COPD with chronic bronchitis patient is now no longer smoking  Plan is to begin Symbicort 162 inhalations twice daily continue albuterol as needed

## 2020-08-11 NOTE — Progress Notes (Signed)
Subjective:    Patient ID: Jordan [name], male    DOB: 08/27/1956, 64 y.o.   MRN: [id number]  06/04/20  Patient is here to establish care and previously seen by her physician assistant in June after admission in May for multiple trauma after a fall from 20 feet while trying to trim a tree.  See documentation from previous visit After hospitalization 5/21-5/24/2021 after a fall and sustaining multiple fractures.  He has f/up with Dr Lucia Gaskins on June 14.  Sees Dr Megan Salon June 17th.  Running out of pain meds.  No fever.  No SOB.  Appetite is good.  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 Coronary artery calcifications  Discharge Diagnosis 20 foot fall from a tree  T1 wedge fx T2 pincer type deformity T3 superior endplate incomplete burst fx with 30mm retropulsion and mild canal stenosis R T3 TP f L1-2 L TP fx Possible posterior tension band disruption at T2-4 Left scapula fx R 4-11 rib fx L 3-11 rib fx Trace R apical pneumothorax Small R effusion, atelectasis/ pulm contusion Small volume free fluid in the pelvis  Incidental findings include but not limited to:  Multileval c/t/l spine degenerative changes Aortic atherosclerosis, 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 He reports shallow breathing since the incident,but developed worsening shortness of breath, chest pain and shoulder pain and contacted EMS. By report he was originally walking around but became dizzy and more lethargic en route. He was hypertensive up to 810 systolic on route, sats have remained in the mid 90s, no tachycardia.  He does smoke crack cocaine and has done so since his fall today.  Procedures None  Hospital Course: Patient was admitted to the trauma service for above diagnoses. Neurosurgery was consulted recommended non-op, cervical collar and follow up in the office. Orthopedics was consulted and recommended non-op, sling for comfort, and follow up in the office. Repeat CXR showed no evidence of PTX. Patient worked with therapies who recommended [place]. Per notes, ambulation improved and patient was walking in halls on 5/24 without o2. Patient was discharged home with below follow up.  Iwas not directly involved in this patient's care and did not see the patient during their hospital stay, therefore the information in this discharge summary was taken entirely from the chart.  I called the patient on 5/27 at 10:34 a.m. to notify him that we recommend follow up with his PCP for incidental3.4cm infrarenal AAAseen on CTRecommend followup by abdomen and pelvis CTA in 6 months. Patient does not have a PCP. I discussed with him to call the number on his Medicare card to establish care with a PCP. I also gave him [place] and wellness number. I gave him the number for Vascular Surgery as a  referral. I recommended follow up with Neurosurgery for his fx's and provided their phone number. I also provided the patient with [facility] offices number incase he has any further questions or concerns. I reiterated that he is to follow up with Dr. Lucia Gaskins of Orthopedics. He reports he has already made a follow up with  Dr. Lucia Gaskins.  Observations/Objective:  NAD.  Patient expresses anger "at system."   Assessment and Plan: 1. Fall, sequela - traMADol (ULTRAM) 50 MG tablet; Take 1 tablet (50 mg total) by mouth every 6 (six) hours as needed for up to 5 days.  Dispense: 30 tablet; Refill: 0  2. Closed wedge compression fracture of T1 vertebra with routine healing, subsequent encounter - traMADol (ULTRAM) 50 MG tablet; Take 1 tablet (50 mg total) by mouth every 6 (six) hours as needed for up to 5 days.  Dispense: 30 tablet; Refill: 0  3. Multiple closed fractures of ribs of both sides with delayed healing, subsequent encounter - traMADol (ULTRAM) 50 MG tablet; Take 1 tablet (50 mg total) by mouth every 6 (six) hours as needed for up to 5 days.  Dispense: 30 tablet; Refill: 0  My nurse is calling to see if we can get him appts sooner with ortho and neurosurgery.  I have advised him to go to ED/UC if anything worsens or pain is not manageable.  Patient verbalizes understanding.   Patient currently is doing much better he has had follow-up with orthopedic he is now off all pain medication his fractures are nonrevealing.  He is more ambulatory.  He is back at work.  The patient has a former history of heavy alcohol use but has not had any alcohol use since the early 2000's.  He smoked heavily but has quit smoking since his injuries. Patient does note he has daily cough and some chronic dyspnea on exertion  08/11/2020 Patient returns for primary care follow-up he is back working as a Development worker, international aid he is trying to avoid climbing tall ladders he still has a knot above his left eyebrow he is questioning he is yet to turn in his Cologuard test he needs dental work and this is yet to be completed. Patient on arrival is 186/99 blood pressure previous blood pressure levels have been elevated when he was in the hospital. Note he does have a hepatitis C antibody is positive but no significant viral loads. He had elevated liver  function test previously this needs to be rechecked. Patient is no longer smoking he does have wheezing and chest x-ray showed COPD changes at the last visit he is only on the albuterol inhaler as needed  Patient does have erectile dysfunction and is requesting medication for this there is no contraindication to medication for erectile dysfunction in this patient The patient is yet to turn in his Cologuard test ordered at the last visit     Review of Systems  HENT: Positive for dental problem.   Respiratory: Positive for cough, chest tightness, shortness of breath and wheezing.   Cardiovascular: Negative for chest pain, palpitations and leg swelling.  Gastrointestinal: Negative.        Objective:   Physical Exam Vitals:   08/11/20 0912  BP: (!) 186/99  Pulse: 72  Resp: 16  Temp: 98.3 F (36.8 C)  SpO2: 95%  Weight: 161 lb 12.8 oz (73.4 kg)    Gen: Pleasant, well-nourished, in no distress,  normal affect  ENT: No lesions,  mouth clear,  oropharynx clear, no postnasal drip, there is  a slight raised area above the left eyebrow which appears to be from prior scarring I did not detect any other pathology in this area  Neck: No JVD, no TMG, no carotid bruits  Lungs: No use of accessory muscles, no dullness to percussion, few scattered rhonchi and prolonged expiratory phase Cardiovascular: RRR, heart sounds normal, no murmur or gallops, no peripheral edema  Abdomen: soft and NT, no HSM,  BS normal  Musculoskeletal: No deformities, no cyanosis or clubbing  Neuro: alert, non focal  Skin: Warm, no lesions or rashes      Assessment & Plan:  I personally reviewed all images and lab data in the [facility] system as well as any outside material available during this office visit and agree with the  radiology impressions.   Erectile dysfunction Plan is to prescribe 50 mg of Viagra as needed  COPD with chronic bronchitis ([place]) Chest x-ray and exam compatible with COPD with chronic  bronchitis patient is now no longer smoking  Plan is to begin Symbicort 162 inhalations twice daily continue albuterol as needed  Hypertension Hypertension on the basis of primary hypertension plan is to begin amlodipine 10 mg daily  History of tobacco use Patient congratulated on having ceased smoking  Elevated liver function tests History of hepatitis C antibody but no significant viral load will refollow-up liver function test   [place] was seen today for follow-up.  Diagnoses and all orders for this visit:  Elevated liver function tests -     Hepatic function panel  Erectile dysfunction, unspecified erectile dysfunction type  COPD with chronic bronchitis ([place])  Essential hypertension  History of tobacco use  Other orders -     budesonide-formoterol (SYMBICORT) 160-4.5 MCG/ACT inhaler; Inhale 2 puffs into the lungs 2 (two) times daily. -     amLODipine (NORVASC) 10 MG tablet; Take 1 tablet (10 mg total) by mouth daily. -     sildenafil (VIAGRA) 50 MG tablet; Take 1 tablet (50 mg total) by mouth daily as needed for erectile dysfunction.

## 2020-08-12 ENCOUNTER — Other Ambulatory Visit: Payer: Self-pay | Admitting: Critical Care Medicine

## 2020-08-12 LAB — HEPATIC FUNCTION PANEL
ALT: 38 IU/L (ref 0–44)
AST: 27 IU/L (ref 0–40)
Albumin: 4 g/dL (ref 3.8–4.8)
Alkaline Phosphatase: 135 IU/L — ABNORMAL HIGH (ref 44–121)
Bilirubin Total: 0.2 mg/dL (ref 0.0–1.2)
Bilirubin, Direct: <0.1 mg/dL (ref 0.00–0.40)
Total Protein: 7 g/dL (ref 6.0–8.5)

## 2020-08-13 ENCOUNTER — Telehealth: Payer: Self-pay | Admitting: *Deleted

## 2020-08-13 NOTE — Telephone Encounter (Signed)
Copied from [place] [id number]. Topic: General - Inquiry >> Aug 13, 2020  3:17 PM Scherrie Gerlach wrote: Reason for CRM: pt states he cannot afford any of the meds Dr Joya Gaskins sent in for him. Pt states it came to over $700. Would like a call back to discuss alternative.

## 2020-08-14 NOTE — Telephone Encounter (Signed)
Kelly   pls help me ,  Look at the inhalers I Rx for this pt under his medicare plan,  What are alternatives with less expensive copays for this patient  ty

## 2020-08-14 NOTE — Telephone Encounter (Signed)
It looks like patient does not have prescription ins.  His Medicare is for medical only.  I left a voicemail for patient to call me back to see about transferring the rx's and getting him enrolled in patient assistance.

## 2020-08-16 NOTE — Telephone Encounter (Signed)
Makes sense.

## 2020-08-25 LAB — COLOGUARD: Cologuard: POSITIVE — AB

## 2020-08-27 ENCOUNTER — Telehealth: Payer: Self-pay | Admitting: Critical Care Medicine

## 2020-08-27 DIAGNOSIS — R195 Other fecal abnormalities: Secondary | ICD-10-CM

## 2020-08-27 DIAGNOSIS — Z1211 Encounter for screening for malignant neoplasm of colon: Secondary | ICD-10-CM

## 2020-08-27 HISTORY — DX: Other fecal abnormalities: R19.5

## 2020-08-27 NOTE — Telephone Encounter (Signed)
Cologuard is positive.  Pt aware, colonoscopy referral made

## 2020-09-09 ENCOUNTER — Encounter: Payer: Self-pay | Admitting: Internal Medicine

## 2020-09-09 ENCOUNTER — Telehealth: Payer: Self-pay | Admitting: Critical Care Medicine

## 2020-09-09 NOTE — Telephone Encounter (Signed)
Copied from [place] [id number] [id number]. Topic: Referral - Status >> Sep 09, 2020  1:26 PM Yvette Rack wrote: Reason for CRM: Pt would like to know if there is another location that he can be referred to for the colonoscopy. Pt stated that his insurance will not cover cost for him to see a specialist. Pt requests call back

## 2020-09-18 NOTE — Telephone Encounter (Signed)
Patient is scheduled with Rudolpho Sevin  12/8 /21 . I called patient  lvm to call  me back  Thank you .

## 2020-10-15 ENCOUNTER — Telehealth: Payer: Self-pay

## 2020-10-15 NOTE — Telephone Encounter (Signed)
Copied from [place] [id number]. Topic: Referral - Request for Referral >> Oct 12, 2020 12:19 PM Alanda Slim E wrote: Has patient seen PCP for this complaint? No  *If NO, is insurance requiring patient see PCP for this issue before PCP can refer them? Referral for which specialty: Hernia general surgery  Preferred provider/office: Reason for referral: Pt has uncomfortable Hernia and wanted a referral to someone that does the surgery / please call pt to advise of referral   Please place referral and send to Alinda [facility].

## 2020-10-15 NOTE — Telephone Encounter (Signed)
Jordan [name]  I have to see and examine this pt. Cannot be a phone visit.  Pls sched him. Any provider or me

## 2020-10-21 ENCOUNTER — Encounter: Payer: Self-pay | Admitting: Internal Medicine

## 2020-10-21 ENCOUNTER — Other Ambulatory Visit: Payer: Self-pay

## 2020-10-21 ENCOUNTER — Ambulatory Visit (AMBULATORY_SURGERY_CENTER): Payer: Self-pay

## 2020-10-21 VITALS — Ht 71.0 in | Wt 166.0 lb

## 2020-10-21 DIAGNOSIS — R195 Other fecal abnormalities: Secondary | ICD-10-CM

## 2020-10-21 DIAGNOSIS — Z1211 Encounter for screening for malignant neoplasm of colon: Secondary | ICD-10-CM

## 2020-10-21 NOTE — Progress Notes (Signed)
No allergies to soy or egg Pt is not on blood thinners or diet pills  No surgeries or procedures to assess  Sedation/intubation issues   Denies atrial flutter/fib Denies constipation   Emmi instructions given to pt  Pt is aware of Covid safety and care partner requirements.  Pt notes he has lost a lot of wt- used to weigh 180 lbs and has developed a hernia on rt side of abd.

## 2020-10-27 ENCOUNTER — Other Ambulatory Visit: Payer: Self-pay | Admitting: Physician Assistant

## 2020-10-27 ENCOUNTER — Other Ambulatory Visit: Payer: Self-pay

## 2020-10-27 ENCOUNTER — Encounter: Payer: Self-pay | Admitting: Physician Assistant

## 2020-10-27 ENCOUNTER — Ambulatory Visit: Payer: Medicare Other | Attending: Physician Assistant | Admitting: Physician Assistant

## 2020-10-27 VITALS — BP 156/76 | HR 81 | Temp 98.5°F | Ht 72.0 in | Wt 165.0 lb

## 2020-10-27 DIAGNOSIS — K4090 Unilateral inguinal hernia, without obstruction or gangrene, not specified as recurrent: Secondary | ICD-10-CM | POA: Diagnosis not present

## 2020-10-27 MED ORDER — AMLODIPINE BESYLATE 10 MG PO TABS: 10.0000 mg | ORAL_TABLET | Freq: Every day | ORAL | 3 refills | Status: DC

## 2020-10-27 MED FILL — PROAIR RESPICLICK INHAL PWD: 108 (90 BAS | 25 days supply | Qty: 1 | Fill #0

## 2020-10-27 MED FILL — SILDENAFIL CITRATE 50 MG TA: 50 | 20 days supply | Qty: 8 | Fill #0

## 2020-10-27 MED FILL — AMLODIPINE BESYLATE 10 MG T: 10 | 90 days supply | Qty: 90 | Fill #0

## 2020-10-27 MED FILL — SYMBICORT 160-4.5 MCG INH: 160-4.5 | 30 days supply | Qty: 10 | Fill #0

## 2020-10-27 NOTE — Progress Notes (Signed)
Patient has eaten today and patient has taken medication today at 8:30. Patient complains of burning sensation in the his groin on the right side. Patient noticed a bulge but has not had imaging. Colonoscopy scheduled for the 29th.

## 2020-10-27 NOTE — Patient Instructions (Signed)
I have started a referral for you to be seen as soon as possible by general surgery.  I encourage you to use Tylenol to help you with the discomfort, please let us know if the Tylenol does not offer relief.  I encourage you to check your blood pressure at home on a daily basis, please let us know if your blood pressure readings continue to be elevated.  I hope that you feel better soon  Kennieth Rad, PA-C Physician Assistant [place] [email or web address]    Hernia, Adult     A hernia is the bulging of an organ or tissue through a weak spot in the muscles of the abdomen (abdominal wall). Hernias develop most often near the belly button (navel) or the area where the leg meets the lower abdomen (groin). Common types of hernias include:  Incisional hernia. This type bulges through a scar from an abdominal surgery.  Umbilical hernia. This type develops near the navel.  Inguinal hernia. This type develops in the groin or scrotum.  Femoral hernia. This type develops under the groin, in the upper thigh area.  Hiatal hernia. This type occurs when part of the stomach slides above the muscle that separates the abdomen from the chest (diaphragm). What are the causes? This condition may be caused by:  Heavy lifting.  Coughing over a long period of time.  Straining to have a bowel movement. Constipation can lead to straining.  An incision made during an abdominal surgery.  A physical problem that is present at birth (congenital defect).  Being overweight or obese.  Smoking.  Excess fluid in the abdomen.  Undescended testicles in males. What are the signs or symptoms? The main symptom is a skin-colored, rounded bulge in the area of the hernia. However, a bulge may not always be present. It may grow bigger or be more visible when you cough or strain (such as when lifting something heavy). A hernia that can be pushed back  into the area (is reducible) rarely causes pain. A hernia that cannot be pushed back into the area (is incarcerated) may lose its blood supply (become strangulated). A hernia that is incarcerated may cause:  Pain.  Fever.  Nausea and vomiting.  Swelling.  Constipation. How is this diagnosed? A hernia may be diagnosed based on:  Your symptoms and medical history.  A physical exam. Your health care provider may ask you to cough or move in certain ways to see if the hernia becomes visible.  Imaging tests, such as: ? X-rays. ? Ultrasound. ? CT scan. How is this treated? A hernia that is small and painless may not need to be treated. A hernia that is large or painful may be treated with surgery. Inguinal hernias may be treated with surgery to prevent incarceration or strangulation. Strangulated hernias are always treated with surgery because a lack of blood supply to the trapped organ or tissue can cause it to die. Surgery to treat a hernia involves pushing the bulge back into place and repairing the weak area of the muscle or abdominal wall. Follow these instructions at home: Activity  Avoid straining.  Do not lift anything that is heavier than 10 lb (4.5 kg), or the limit that you are told, until your health care provider says that it is safe.  When lifting heavy objects, lift with your leg muscles, not your back muscles. Preventing constipation  Take actions to prevent constipation. Constipation leads to straining with bowel movements, which can make a  hernia worse or cause a hernia repair to break down. Your health care provider may recommend that you: ? Drink enough fluid to keep your urine pale yellow. ? Eat foods that are high in fiber, such as fresh fruits and vegetables, whole grains, and beans. ? Limit foods that are high in fat and processed sugars, such as fried or sweet foods. ? Take an over-the-counter or prescription medicine for constipation. General  instructions  When coughing, try to cough gently.  You may try to push the hernia back in place by very gently pressing on it while lying down. Do not try to force the bulge back in if it will not push in easily.  If you are overweight, work with your health care provider to lose weight safely.  Do not use any products that contain nicotine or tobacco, such as cigarettes and e-cigarettes. If you need help quitting, ask your health care provider.  If you are scheduled for hernia repair, watch your hernia for any changes in shape, size, or color. Tell your health care provider about any changes or new symptoms.  Take over-the-counter and prescription medicines only as told by your health care provider.  Keep all follow-up visits as told by your health care provider. This is important. Contact a health care provider if:  You develop new pain, swelling, or redness around your hernia.  You have signs of constipation, such as: ? Fewer bowel movements in a week than normal. ? Difficulty having a bowel movement. ? Stools that are dry, hard, or larger than normal. Get help right away if:  You have a fever.  You have abdomen pain that gets worse.  You feel nauseous or you vomit.  You cannot push the hernia back in place by very gently pressing on it while lying down. Do not try to force the bulge back in if it will not push in easily.  The hernia: ? Changes in shape, size, or color. ? Feels hard or tender. These symptoms may represent a serious problem that is an emergency. Do not wait to see if the symptoms will go away. Get medical help right away. Call your local emergency services (911 in the [place] Summary  A hernia is the bulging of an organ or tissue through a weak spot in the muscles of the abdomen (abdominal wall).  The main symptom is a skin-colored, rounded lump (bulge) in the hernia area. However, a bulge may not always be present. It may grow bigger or more visible when  you cough or strain (such as when having a bowel movement).  A hernia that is small and painless may not need to be treated. A hernia that is large or painful may be treated with surgery.  Surgery to treat a hernia involves pushing the bulge back into place and repairing the weak part of the abdomen. This information is not intended to replace advice given to you by your health care provider. Make sure you discuss any questions you have with your health care provider. Document Revised: 02/21/2019 Document Reviewed: 08/02/2017 Elsevier Patient Education  [place].

## 2020-10-27 NOTE — Progress Notes (Signed)
Established Patient Office Visit  Subjective:  Patient ID: Jordan [name], male    DOB: 1956/06/30  Age: 64 y.o. MRN: [id number]  CC: No chief complaint on file.   HPI Jordan [name] reports that he has been having abdomina discomfort for the past  Several weeks.  Reports that he feels a bulge in his right inguinal area, states he is able to push it in his right back out.  Reports discomfort and burning, worse when lifting.  Reports he has been wearing tight underwear to help with the discomfort.  Has not been taking any pain relief medications  States that he has had a significant amount of weight loss, has lost 15 pounds in the last 6 months without effort.  Recent positive Cologuard test, patient is scheduled for colonoscopy on November 11, 2020.  Reports he does not check his blood pressure at home, states he is compliant to his blood pressure medications.  Reports that his medications have been expensive and request medications sent to the pharmacy at [facility].  Past Medical History:  Diagnosis Date  . Allergy    seasonal  . COPD (chronic obstructive pulmonary disease) ([place])   . Hypertension   . Multiple fractures 06/04/2020    Past Surgical History:  Procedure Laterality Date  . obstruction removed as a child       Family History  Problem Relation Age of Onset  . Cancer Sister   . Colon polyps Sister   . Colon polyps Father   . Stomach cancer Father 57  . Colon polyps Brother   . Colon cancer Neg Hx   . Esophageal cancer Neg Hx   . Rectal cancer Neg Hx     Social History   Socioeconomic History  . Marital status: Divorced    Spouse name: Not on file  . Number of children: Not on file  . Years of education: Not on file  . Highest education level: Not on file  Occupational History  . Not on file  Tobacco Use  . Smoking status: Former Research scientist (life sciences)  . Smokeless tobacco: Never Used  Vaping Use  . Vaping Use: Never used  Substance and  Sexual Activity  . Alcohol use: Never  . 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 Sexual activity: Not on file  Other Topics Concern  . Not on file  Social History Narrative  . Not on file   Social Determinants of Health   Financial Resource Strain: Not on file  Food Insecurity: Not on file  Transportation Needs: Not on file  Physical Activity: Not on file  Stress: Not on file  Social Connections: Not on file  Intimate Partner Violence: Not on file    Outpatient Medications Prior to Visit  Medication Sig Dispense Refill  . Albuterol Sulfate (PROAIR RESPICLICK) 053 (90 Base) MCG/ACT AEPB Inhale 2 puffs into the lungs every 6 (six) hours as needed. 1 each 4  . amLODipine (NORVASC) 10 MG tablet Take 1 tablet (10 mg total) by mouth daily. 90 tablet 3  . budesonide-formoterol (SYMBICORT) 160-4.5 MCG/ACT inhaler Inhale 2 puffs into the lungs 2 (two) times daily. 1 each 12  . sildenafil (VIAGRA) 50 MG tablet Take 1 tablet (50 mg total) by mouth daily as needed for erectile dysfunction. (Patient not taking: Reported on 10/21/2020) 8 tablet 1   No facility-administered medications prior to visit.    No Known Allergies  ROS Review of Systems  Constitutional: Negative for fever.  HENT: Negative.   Eyes: Negative.   Respiratory: Negative.   Gastrointestinal: Positive for abdominal pain.  Endocrine: Negative.   Genitourinary: Negative for penile pain, penile swelling, scrotal swelling and testicular pain.  Musculoskeletal: Negative.   Skin: Negative for color change.  Allergic/Immunologic: Negative.   Neurological: Negative.   Hematological: Negative.   Psychiatric/Behavioral: Negative.       Objective:    Physical Exam Exam conducted with a chaperone present.  Constitutional:      General: He is not in acute distress.    Appearance: Normal appearance. He is not ill-appearing.  HENT:     Head: Normocephalic and atraumatic.     Right Ear: External ear  normal.     Left Ear: External ear normal.     Nose: Nose normal.     Mouth/Throat:     Pharynx: Oropharynx is clear.  Eyes:     Conjunctiva/sclera: Conjunctivae normal.     Pupils: Pupils are equal, round, and reactive to light.  Cardiovascular:     Rate and Rhythm: Normal rate and regular rhythm.     Pulses: Normal pulses.     Heart sounds: Normal heart sounds.  Pulmonary:     Breath sounds: Normal breath sounds.  Abdominal:     Hernia: A hernia is present. Hernia is present in the right inguinal area.  Genitourinary:   Musculoskeletal:        General: Normal range of motion.     Cervical back: Normal range of motion and neck supple.  Skin:    General: Skin is warm and dry.  Neurological:     General: No focal deficit present.     Mental Status: He is alert and oriented to person, place, and time.  Psychiatric:        Mood and Affect: Mood normal.        Behavior: Behavior normal.        Thought Content: Thought content normal.        Judgment: Judgment normal.     There were no vitals taken for this visit. Wt Readings from Last 3 Encounters:  10/21/20 166 lb (75.3 kg)  08/11/20 161 lb 12.8 oz (73.4 kg)  06/04/20 164 lb (74.4 kg)     Health Maintenance Due  Topic Date Due  . COVID-19 Vaccine (3 - Booster for Pfizer series) 09/25/2020    There are no preventive care reminders to display for this patient.  No results found for: TSH Lab Results  Component Value Date   WBC 5.3 04/06/2020   HGB 13.5 04/06/2020   HCT 41.4 04/06/2020   MCV 92.6 04/06/2020   PLT 160 04/06/2020   Lab Results  Component Value Date   NA 136 04/06/2020   K 4.3 04/06/2020   CO2 23 04/06/2020   GLUCOSE 130 (H) 04/06/2020   BUN 22 04/06/2020   CREATININE 1.05 04/06/2020   BILITOT 0.2 08/11/2020   ALKPHOS 135 (H) 08/11/2020   AST 27 08/11/2020   ALT 38 08/11/2020   PROT 7.0 08/11/2020   ALBUMIN 4.0 08/11/2020   CALCIUM 8.0 (L) 04/06/2020   ANIONGAP 7 04/06/2020   No  results found for: CHOL No results found for: HDL No results found for: LDLCALC No results found for: TRIG No results found for: CHOLHDL No results found for: HGBA1C    Assessment & Plan:   Problem List Items Addressed This Visit   None  1. Unilateral inguinal hernia without obstruction or gangrene, recurrence not specified Urgent referral to general surgery.  Patient education given on prompt reevaluation at the emergency department.  Encouraged trial Tylenol over-the-counter for discomfort - Ambulatory referral to General Surgery  No orders of the defined types were placed in this encounter.   I have reviewed the patient's medical history (PMH, PSH, Social History, Family History, Medications, and allergies) , and have been updated if relevant. I spent 20 minutes reviewing chart and  face to face time with patient.    Follow-up: No follow-ups on file.    Loraine Grip Arlen Dupuis, PA-C

## 2020-11-11 ENCOUNTER — Ambulatory Visit (AMBULATORY_SURGERY_CENTER): Payer: Medicare Other | Admitting: Internal Medicine

## 2020-11-11 ENCOUNTER — Encounter: Payer: Self-pay | Admitting: Internal Medicine

## 2020-11-11 ENCOUNTER — Other Ambulatory Visit: Payer: Self-pay

## 2020-11-11 VITALS — BP 148/86 | HR 74 | Temp 98.1°F | Resp 19 | Ht 71.0 in | Wt 166.0 lb

## 2020-11-11 DIAGNOSIS — D125 Benign neoplasm of sigmoid colon: Secondary | ICD-10-CM

## 2020-11-11 DIAGNOSIS — K5730 Diverticulosis of large intestine without perforation or abscess without bleeding: Secondary | ICD-10-CM | POA: Diagnosis not present

## 2020-11-11 DIAGNOSIS — C187 Malignant neoplasm of sigmoid colon: Secondary | ICD-10-CM

## 2020-11-11 DIAGNOSIS — R195 Other fecal abnormalities: Secondary | ICD-10-CM

## 2020-11-11 HISTORY — PX: COLONOSCOPY: SHX174

## 2020-11-11 MED ORDER — SODIUM CHLORIDE 0.9 % IV SOLN: 500.0000 mL | Freq: Once | INTRAVENOUS | Status: DC

## 2020-11-11 NOTE — Op Note (Signed)
[facility] Patient Name: Jordan [name] Procedure Date: 11/11/2020 10:14 AM MRN: [id number] Endoscopist: Iva Boop , MD Age: 64 Referring MD:  Date of Birth: September 13, 1956 Gender: Male Account #: [id number] Procedure:                Colonoscopy Indications:              Positive Cologuard test Medicines:                Propofol per Anesthesia, Monitored Anesthesia Care Procedure:                Pre-Anesthesia Assessment:                           - Prior to the procedure, a History and Physical                            was performed, and patient medications and                            allergies were reviewed. The patient's tolerance of                            previous anesthesia was also reviewed. The risks                            and benefits of the procedure and the sedation                            options and risks were discussed with the patient.                            All questions were answered, and informed consent                            was obtained. Prior Anticoagulants: The patient has                            taken no previous anticoagulant or antiplatelet                            agents. ASA Grade Assessment: III - A patient with                            severe systemic disease. After reviewing the risks                            and benefits, the patient was deemed in                            satisfactory condition to undergo the procedure.                           After obtaining informed consent, the colonoscope  was passed under direct vision. Throughout the                            procedure, the patient's blood pressure, pulse, and                            oxygen saturations were monitored continuously. The                            Olympus CF-[id number] was introduced                            through the anus and advanced to the the cecum,                             identified by appendiceal orifice and ileocecal                            valve. The colonoscopy was somewhat difficult due                            to large distal sigmoid polyp and angulation. The                            patient tolerated the procedure well. The quality                            of the bowel preparation was good. The bowel                            preparation used was Miralax via split dose                            instruction. The ileocecal valve, appendiceal                            orifice, and rectum were photographed. Scope In: 10:39:10 AM Scope Out: 11:09:37 AM Scope Withdrawal Time: 0 hours 22 minutes 54 seconds  Total Procedure Duration: 0 hours 30 minutes 27 seconds  Findings:                 The perianal and digital rectal examinations were                            normal. Pertinent negatives include normal prostate                            (size, shape, and consistency).                           A 40 mm polyp was found in the distal sigmoid                            colon. The polyp was pedunculated. The polyp was  removed with a piecemeal technique using a hot                            snare. Resection and retrieval were complete.                            Verification of patient identification for the                            specimen was done. Estimated blood loss: none.                            prophylactic endoloop placed.                           A few diverticula were found in the sigmoid colon.                           The exam was otherwise without abnormality on                            direct and retroflexion views. Complications:            No immediate complications. Estimated Blood Loss:     Estimated blood loss: none. Impression:               - One 40 mm polyp in the distal sigmoid colon,                            removed piecemeal using a hot snare. Resected and                             retrieved. prophylactc endoloop placed on stalk.                           - Diverticulosis in the sigmoid colon.                           - The examination was otherwise normal on direct                            and retroflexion views. Recommendation:           - Patient has a contact number available for                            emergencies. The signs and symptoms of potential                            delayed complications were discussed with the                            patient. Return to normal activities tomorrow.                            Written  discharge instructions were provided to the                            patient.                           - Resume previous diet.                           - Continue present medications.                           - No aspirin, ibuprofen, naproxen, or other                            non-steroidal anti-inflammatory drugs for 2 weeks                            after polyp removal.                           - Repeat colonoscopy is recommended for                            surveillance. The colonoscopy date will be                            determined after pathology results from today's                            exam become available for review. Gatha Mayer, MD 11/11/2020 11:20:27 AM This report has been signed electronically.

## 2020-11-11 NOTE — Progress Notes (Signed)
To PACU, VSS. Report to [email or web address] 

## 2020-11-11 NOTE — Progress Notes (Signed)
Pt's states no medical or surgical changes since previsit or office visit.   Vitals AG 

## 2020-11-11 NOTE — Progress Notes (Signed)
Called to room to assist during endoscopic procedure.  Patient ID and intended procedure confirmed with present staff. Received instructions for my participation in the procedure from the performing physician.  Endo loop assisted by Belinda Block

## 2020-11-11 NOTE — Patient Instructions (Addendum)
I removed one large polyp. It could be a cancer - but think it is removed. I will let you know.  I appreciate the opportunity to care for you. Gatha Mayer, MD, [facility]  No NSAIDS (aspirin, ibuprofen or aleve) for 2 weeks post polyp YOU HAD AN ENDOSCOPIC PROCEDURE TODAY AT [facility]:   Refer to the procedure report that was given to you for any specific questions about what was found during the examination.  If the procedure report does not answer your questions, please call your gastroenterologist to clarify.  If you requested that your care partner not be given the details of your procedure findings, then the procedure report has been included in a sealed envelope for you to review at your convenience later.  YOU SHOULD EXPECT: Some feelings of bloating in the abdomen. Passage of more gas than usual.  Walking can help get rid of the air that was put into your GI tract during the procedure and reduce the bloating. If you had a lower endoscopy (such as a colonoscopy or flexible sigmoidoscopy) you may notice spotting of blood in your stool or on the toilet paper. If you underwent a bowel prep for your procedure, you may not have a normal bowel movement for a few days.  Please Note:  You might notice some irritation and congestion in your nose or some drainage.  This is from the oxygen used during your procedure.  There is no need for concern and it should clear up in a day or so.  SYMPTOMS TO REPORT IMMEDIATELY:   Following lower endoscopy (colonoscopy or flexible sigmoidoscopy):  Excessive amounts of blood in the stool  Significant tenderness or worsening of abdominal pains  Swelling of the abdomen that is new, acute  Fever of 100F or higher   For urgent or emergent issues, a gastroenterologist can be reached at any hour by calling [phone]. Do not use MyChart messaging for urgent concerns.    DIET:  We do recommend a small meal at first, but then you may proceed  to your regular diet.  Drink plenty of fluids but you should avoid alcoholic beverages for 24 hours.  ACTIVITY:  You should plan to take it easy for the rest of today and you should NOT DRIVE or use heavy machinery until tomorrow (because of the sedation medicines used during the test).    FOLLOW UP: Our staff will call the number listed on your records 48-72 hours following your procedure to check on you and address any questions or concerns that you may have regarding the information given to you following your procedure. If we do not reach you, we will leave a message.  We will attempt to reach you two times.  During this call, we will ask if you have developed any symptoms of COVID 19. If you develop any symptoms (ie: fever, flu-like symptoms, shortness of breath, cough etc.) before then, please call [phone].  If you test positive for Covid 19 in the 2 weeks post procedure, please call and report this information to [place].    If any biopsies were taken you will be contacted by phone or by letter within the next 1-3 weeks.  Please call us at [phone] if you have not heard about the biopsies in 3 weeks.    SIGNATURES/CONFIDENTIALITY: You and/or your care partner have signed paperwork which will be entered into your electronic medical record.  These signatures attest to the fact that that the  information above on your After Visit Summary has been reviewed and is understood.  Full responsibility of the confidentiality of this discharge information lies with you and/or your care-partner.removal.  Await pathology results.  Resume previous diet and medications.

## 2020-11-16 ENCOUNTER — Telehealth: Payer: Self-pay | Admitting: *Deleted

## 2020-11-16 ENCOUNTER — Telehealth: Payer: Self-pay

## 2020-11-16 NOTE — Telephone Encounter (Signed)
  Follow up Call-  Call back number 11/11/2020  Post procedure Call Back phone  # [phone]  Permission to leave phone message Yes  Some recent data might be hidden     Patient questions:  Do you have a fever, pain , or abdominal swelling? No. Pain Score  0 *  Have you tolerated food without any problems? Yes.    Have you been able to return to your normal activities? Yes.    Do you have any questions about your discharge instructions: Diet   No. Medications  No. Follow up visit  No.  Do you have questions or concerns about your Care? No.  Actions: * If pain score is 4 or above: No action needed, pain <4.  I spoke with pt's wife, pt was in the shower.  She said "as far as I know, no problems".  I asked her to let pt know we called to check on him.  To call us back if any problems or COVID sx since his procedure.  Pt's wife said she would. maw

## 2020-11-16 NOTE — Telephone Encounter (Signed)
  Follow up Call-  Call back number 11/11/2020  Post procedure Call Back phone  # [phone]  Permission to leave phone message Yes  Some recent data might be hidden     Patient questions:  Message left to call us if necessary.

## 2020-11-20 ENCOUNTER — Telehealth: Payer: Self-pay | Admitting: Critical Care Medicine

## 2020-11-20 NOTE — Telephone Encounter (Signed)
Copied from [place] [id number]. Topic: General - Other >> Nov 19, 2020  2:20 PM Leward Quan A wrote: Reason for CRM: Patient called in stated that Dr Joya Gaskins called him and he was returning the call. Can be reached at Ph# [phone] >> Nov 19, 2020  4:19 PM Leward Quan A wrote: Patient called in stated that Dr Joya Gaskins called him back again and he missed the call and  was returning the call. Can be reached at Ph# [phone]

## 2020-11-20 NOTE — Telephone Encounter (Signed)
I spoke to pt and he is aware of colon cancer in colonoscopy and to return to [facility] next week for further testing

## 2020-11-22 NOTE — Progress Notes (Signed)
Subjective:    Patient ID: Jordan [name], male    DOB: 08/27/1956, 65 y.o.   MRN: [id number]  06/04/20  Patient is here to establish care and previously seen by her physician assistant in June after admission in May for multiple trauma after a fall from 20 feet while trying to trim a tree.  See documentation from previous visit After hospitalization 5/21-5/24/2021 after a fall and sustaining multiple fractures.  He has f/up with Dr Lucia Gaskins on June 14.  Sees Dr Megan Salon June 17th.  Running out of pain meds.  No fever.  No SOB.  Appetite is good.  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 Coronary artery calcifications  Discharge Diagnosis 20 foot fall from a tree  T1 wedge fx T2 pincer type deformity T3 superior endplate incomplete burst fx with 30mm retropulsion and mild canal stenosis R T3 TP f L1-2 L TP fx Possible posterior tension band disruption at T2-4 Left scapula fx R 4-11 rib fx L 3-11 rib fx Trace R apical pneumothorax Small R effusion, atelectasis/ pulm contusion Small volume free fluid in the pelvis  Incidental findings include but not limited to:  Multileval c/t/l spine degenerative changes Aortic atherosclerosis, 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 He reports shallow breathing since the incident,but developed worsening shortness of breath, chest pain and shoulder pain and contacted EMS. By report he was originally walking around but became dizzy and more lethargic en route. He was hypertensive up to 810 systolic on route, sats have remained in the mid 90s, no tachycardia.  He does smoke crack cocaine and has done so since his fall today.  Procedures None  Hospital Course: Patient was admitted to the trauma service for above diagnoses. Neurosurgery was consulted recommended non-op, cervical collar and follow up in the office. Orthopedics was consulted and recommended non-op, sling for comfort, and follow up in the office. Repeat CXR showed no evidence of PTX. Patient worked with therapies who recommended [place]. Per notes, ambulation improved and patient was walking in halls on 5/24 without o2. Patient was discharged home with below follow up.  Iwas not directly involved in this patient's care and did not see the patient during their hospital stay, therefore the information in this discharge summary was taken entirely from the chart.  I called the patient on 5/27 at 10:34 a.m. to notify him that we recommend follow up with his PCP for incidental3.4cm infrarenal AAAseen on CTRecommend followup by abdomen and pelvis CTA in 6 months. Patient does not have a PCP. I discussed with him to call the number on his Medicare card to establish care with a PCP. I also gave him [place] and wellness number. I gave him the number for Vascular Surgery as a  referral. I recommended follow up with Neurosurgery for his fx's and provided their phone number. I also provided the patient with [facility] offices number incase he has any further questions or concerns. I reiterated that he is to follow up with Dr. Lucia Gaskins of Orthopedics. He reports he has already made a follow up with  Dr. Lucia Gaskins.  Observations/Objective:  NAD.  Patient expresses anger "at system."   Assessment and Plan: 1. Fall, sequela - traMADol (ULTRAM) 50 MG tablet; Take 1 tablet (50 mg total) by mouth every 6 (six) hours as needed for up to 5 days.  Dispense: 30 tablet; Refill: 0  2. Closed wedge compression fracture of T1 vertebra with routine healing, subsequent encounter - traMADol (ULTRAM) 50 MG tablet; Take 1 tablet (50 mg total) by mouth every 6 (six) hours as needed for up to 5 days.  Dispense: 30 tablet; Refill: 0  3. Multiple closed fractures of ribs of both sides with delayed healing, subsequent encounter - traMADol (ULTRAM) 50 MG tablet; Take 1 tablet (50 mg total) by mouth every 6 (six) hours as needed for up to 5 days.  Dispense: 30 tablet; Refill: 0  My nurse is calling to see if we can get him appts sooner with ortho and neurosurgery.  I have advised him to go to ED/UC if anything worsens or pain is not manageable.  Patient verbalizes understanding.   Patient currently is doing much better he has had follow-up with orthopedic he is now off all pain medication his fractures are nonrevealing.  He is more ambulatory.  He is back at work.  The patient has a former history of heavy alcohol use but has not had any alcohol use since the early 2000's.  He smoked heavily but has quit smoking since his injuries. Patient does note he has daily cough and some chronic dyspnea on exertion  08/11/2020 Patient returns for primary care follow-up he is back working as a Development worker, international aid he is trying to avoid climbing tall ladders he still has a knot above his left eyebrow he is questioning he is yet to turn in his Cologuard test he needs dental work and this is yet to be completed. Patient on arrival is 186/99 blood pressure previous blood pressure levels have been elevated when he was in the hospital. Note he does have a hepatitis C antibody is positive but no significant viral loads. He had elevated liver  function test previously this needs to be rechecked. Patient is no longer smoking he does have wheezing and chest x-ray showed COPD changes at the last visit he is only on the albuterol inhaler as needed  Patient does have erectile dysfunction and is requesting medication for this there is no contraindication to medication for erectile dysfunction in this patient The patient is yet to turn in his Cologuard test ordered at the last visit  11/23/20: This patient is seen in return follow-up and had a positive Cologuard which resulted in colonoscopy showing pedunculated colon cancer in the colon.  He now is to have repeat procedure in this week to ensure the margins are clear.  Patient is also healed up from his fall with fracture of the left clavicle and multiple right ribs.  Patient does state he has minimal swallowing issues.  The patient also has a hernia in the right groin inguinal area and is scheduled for evaluation by general surgery on the 17th of this month.  The patient is using albuterol as needed for his breathing and has no  other complaints at this time.  Patient continues to struggle with erectile dysfunction 50 mg of Viagra was insufficient and he is asking for an increased dose     Review of Systems  HENT: Positive for dental problem.   Respiratory: Positive for shortness of breath. Negative for cough, chest tightness and wheezing.   Cardiovascular: Negative for chest pain, palpitations and leg swelling.  Gastrointestinal: Negative.   Genitourinary: Negative.   Musculoskeletal: Negative.        Objective:   Physical Exam Vitals:   11/23/20 1405  BP: (!) 152/86  Pulse: 89  Resp: 16  Temp: 98 F (36.7 C)  TempSrc: Oral  SpO2: 95%  Weight: 169 lb (76.7 kg)    Gen: Pleasant, well-nourished, in no distress,  normal affect  ENT: No lesions,  mouth clear,  oropharynx clear, no postnasal drip, there is a slight raised area above the left eyebrow which appears to be from  prior scarring I did not detect any other pathology in this area There is very poor dentition in the right upper gum Neck: No JVD, no TMG, no carotid bruits  Lungs: No use of accessory muscles, no dullness to percussion, few scattered rhonchi and prolonged expiratory phase Cardiovascular: RRR, heart sounds normal, no murmur or gallops, no peripheral edema  Abdomen: soft and NT, no HSM,  BS normal  Musculoskeletal: No deformities, no cyanosis or clubbing  Neuro: alert, non focal  Skin: Warm, no lesions or rashes      Assessment & Plan:  I personally reviewed all images and lab data in the [facility] system as well as any outside material available during this office visit and agree with the  radiology impressions.   Hypertension Hypertension not yet at goal  Plan to begin valsartan HCT 160/25 1 daily  COPD with chronic bronchitis (HCC) COPD stable at this time continue albuterol as needed  Elevated liver function tests History of elevated liver function test which are resolving  Erectile dysfunction Plan to increase dose of Viagra to 100 mg as needed  History of tobacco use The patient has successfully stopped smoking I continue to congratulate him in this regard  Colon cancer [facility]) Colon cancer found on recent colonoscopy  Repeat colonoscopy this week to determine margins  Further follow-up and treatment per gastroenterology  Positive colorectal cancer screening using Cologuard test The positive Cologuard led to the colonoscopy which led to the diagnosis of colon cancer  Unilateral inguinal hernia without obstruction or gangrene Right-sided inguinal hernia Referral to general surgery  Dental caries Referral to dental clinic   Zia was seen today for follow-up.  Diagnoses and all orders for this visit:  Primary hypertension  COPD with chronic bronchitis (HCC)  Elevated liver function tests  Erectile dysfunction, unspecified erectile dysfunction  type  History of tobacco use  Malignant neoplasm of colon, unspecified part of colon ([place])  Positive colorectal cancer screening using Cologuard test  Unilateral inguinal hernia without obstruction or gangrene, recurrence not specified  Dental caries  Other orders -     sildenafil (VIAGRA) 100 MG tablet; Take 1 tablet (100 mg total) by mouth daily as needed for erectile dysfunction. -     Albuterol Sulfate (PROAIR RESPICLICK) 409 (90 Base) MCG/ACT AEPB; Inhale 2 puffs into the lungs every 6 (six) hours as needed. -     valsartan-hydrochlorothiazide (DIOVAN HCT) 160-25 MG tablet; Take 1 tablet by mouth daily.

## 2020-11-23 ENCOUNTER — Other Ambulatory Visit: Payer: Self-pay

## 2020-11-23 ENCOUNTER — Ambulatory Visit: Payer: Medicare Other | Attending: Critical Care Medicine | Admitting: Critical Care Medicine

## 2020-11-23 ENCOUNTER — Other Ambulatory Visit: Payer: Self-pay | Admitting: Critical Care Medicine

## 2020-11-23 ENCOUNTER — Encounter: Payer: Self-pay | Admitting: Critical Care Medicine

## 2020-11-23 VITALS — BP 152/86 | HR 89 | Temp 98.0°F | Resp 16 | Wt 169.0 lb

## 2020-11-23 DIAGNOSIS — K029 Dental caries, unspecified: Secondary | ICD-10-CM

## 2020-11-23 DIAGNOSIS — C189 Malignant neoplasm of colon, unspecified: Secondary | ICD-10-CM

## 2020-11-23 DIAGNOSIS — I10 Essential (primary) hypertension: Secondary | ICD-10-CM | POA: Diagnosis not present

## 2020-11-23 DIAGNOSIS — R7989 Other specified abnormal findings of blood chemistry: Secondary | ICD-10-CM | POA: Diagnosis not present

## 2020-11-23 DIAGNOSIS — N529 Male erectile dysfunction, unspecified: Secondary | ICD-10-CM | POA: Diagnosis not present

## 2020-11-23 DIAGNOSIS — J449 Chronic obstructive pulmonary disease, unspecified: Secondary | ICD-10-CM | POA: Diagnosis not present

## 2020-11-23 DIAGNOSIS — R195 Other fecal abnormalities: Secondary | ICD-10-CM

## 2020-11-23 DIAGNOSIS — Z87891 Personal history of nicotine dependence: Secondary | ICD-10-CM

## 2020-11-23 DIAGNOSIS — J4489 Other specified chronic obstructive pulmonary disease: Secondary | ICD-10-CM

## 2020-11-23 DIAGNOSIS — K4090 Unilateral inguinal hernia, without obstruction or gangrene, not specified as recurrent: Secondary | ICD-10-CM

## 2020-11-23 MED ORDER — SILDENAFIL CITRATE 100 MG PO TABS: 100.0000 mg | ORAL_TABLET | Freq: Every day | ORAL | 1 refills | Status: DC | PRN

## 2020-11-23 MED ORDER — VALSARTAN-HYDROCHLOROTHIAZIDE 160-25 MG PO TABS: 1.0000 | ORAL_TABLET | Freq: Every day | ORAL | 1 refills | Status: DC

## 2020-11-23 MED ORDER — PROAIR RESPICLICK 108 (90 BASE) MCG/ACT IN AEPB: 2.0000 | INHALATION_SPRAY | Freq: Four times a day (QID) | RESPIRATORY_TRACT | 4 refills | Status: DC | PRN

## 2020-11-23 NOTE — Patient Instructions (Addendum)
Begin valsartan HCT 1 daily for blood pressure elevation Continue amlodipine 1 daily Use albuterol as needed for shortness of breath as prescribed We have discontinued the Symbicort for now Increase Viagra to 100 mg tablet as needed for erection Please keep your appointment for your hernia evaluation and keep your gastroenterology appointments upcoming  Please schedule an appointment with a dentist to evaluate your teeth to have those extracted at a later date this year  A nurse will call you from the homebound COVID-vaccine program to arrange to come to your home to give you your booster vaccine you will be receiving the [place] booster which is equivalent to the [place]  Keep your blood pressure readings recorded bring these with you to your next visit with me in 6 weeks  Follow healthy diet as outlined below and continue to focus on smoking cessation   [email or web address]">  DASH Eating Plan DASH stands for Dietary Approaches to Stop Hypertension. The DASH eating plan is a healthy eating plan that has been shown to:  Reduce high blood pressure (hypertension).  Reduce your risk for type 2 diabetes, heart disease, and stroke.  Help with weight loss. What are tips for following this plan? Reading food labels  Check food labels for the amount of salt (sodium) per serving. Choose foods with less than 5 percent of the Daily Value of sodium. Generally, foods with less than 300 milligrams (mg) of sodium per serving fit into this eating plan.  To find whole grains, look for the word "whole" as the first word in the ingredient list. Shopping  Buy products labeled as "low-sodium" or "no salt added."  Buy fresh foods. Avoid canned foods and pre-made or frozen meals. Cooking  Avoid adding salt when cooking. Use salt-free seasonings or herbs instead of table salt or sea salt. Check with your health care provider or pharmacist before using salt  substitutes.  Do not fry foods. Cook foods using healthy methods such as baking, boiling, grilling, roasting, and broiling instead.  Cook with heart-healthy oils, such as olive, canola, avocado, soybean, or sunflower oil. Meal planning  Eat a balanced diet that includes: ? 4 or more servings of fruits and 4 or more servings of vegetables each day. Try to fill one-half of your plate with fruits and vegetables. ? 6-8 servings of whole grains each day. ? Less than 6 oz (170 g) of lean meat, poultry, or fish each day. A 3-oz (85-g) serving of meat is about the same size as a deck of cards. One egg equals 1 oz (28 g). ? 2-3 servings of low-fat dairy each day. One serving is 1 cup (237 mL). ? 1 serving of nuts, seeds, or beans 5 times each week. ? 2-3 servings of heart-healthy fats. Healthy fats called omega-3 fatty acids are found in foods such as walnuts, flaxseeds, fortified milks, and eggs. These fats are also found in cold-water fish, such as sardines, salmon, and mackerel.  Limit how much you eat of: ? Canned or prepackaged foods. ? Food that is high in trans fat, such as some fried foods. ? Food that is high in saturated fat, such as fatty meat. ? Desserts and other sweets, sugary drinks, and other foods with added sugar. ? Full-fat dairy products.  Do not salt foods before eating.  Do not eat more than 4 egg yolks a week.  Try to eat at least 2 vegetarian meals a week.  Eat more home-cooked food and less restaurant, buffet, and fast food.  Lifestyle  When eating at a restaurant, ask that your food be prepared with less salt or no salt, if possible.  If you drink alcohol: ? Limit how much you use to:  0-1 drink a day for women who are not pregnant.  0-2 drinks a day for men. ? Be aware of how much alcohol is in your drink. In the [place] one drink equals one 12 oz bottle of beer (355 mL), one 5 oz glass of wine (148 mL), or one 1 oz glass of hard liquor (44 mL). General  information  Avoid eating more than 2,300 mg of salt a day. If you have hypertension, you may need to reduce your sodium intake to 1,500 mg a day.  Work with your health care provider to maintain a healthy body weight or to lose weight. Ask what an ideal weight is for you.  Get at least 30 minutes of exercise that causes your heart to beat faster (aerobic exercise) most days of the week. Activities may include walking, swimming, or biking.  Work with your health care provider or dietitian to adjust your eating plan to your individual calorie needs. What foods should I eat? Fruits All fresh, dried, or frozen fruit. Canned fruit in natural juice (without added sugar). Vegetables Fresh or frozen vegetables (raw, steamed, roasted, or grilled). Low-sodium or reduced-sodium tomato and vegetable juice. Low-sodium or reduced-sodium tomato sauce and tomato paste. Low-sodium or reduced-sodium canned vegetables. Grains Whole-grain or whole-wheat bread. Whole-grain or whole-wheat pasta. Brown rice. [place]. Bulgur. Whole-grain and low-sodium cereals. Pita bread. Low-fat, low-sodium crackers. Whole-wheat flour tortillas. Meats and other proteins Skinless chicken or [place]. Ground chicken or [place]. Pork with fat trimmed off. Fish and seafood. Egg whites. Dried beans, peas, or lentils. Unsalted nuts, nut butters, and seeds. Unsalted canned beans. Lean cuts of beef with fat trimmed off. Low-sodium, lean precooked or cured meat, such as sausages or meat loaves. Dairy Low-fat (1%) or fat-free (skim) milk. Reduced-fat, low-fat, or fat-free cheeses. Nonfat, low-sodium ricotta or cottage cheese. Low-fat or nonfat yogurt. Low-fat, low-sodium cheese. Fats and oils Soft margarine without trans fats. Vegetable oil. Reduced-fat, low-fat, or light mayonnaise and salad dressings (reduced-sodium). Canola, safflower, olive, avocado, soybean, and sunflower oils. Avocado. Seasonings and condiments Herbs. Spices.  Seasoning mixes without salt. Other foods Unsalted popcorn and pretzels. Fat-free sweets. The items listed above may not be a complete list of foods and beverages you can eat. Contact a dietitian for more information. What foods should I avoid? Fruits Canned fruit in a light or heavy syrup. Fried fruit. Fruit in cream or butter sauce. Vegetables Creamed or fried vegetables. Vegetables in a cheese sauce. Regular canned vegetables (not low-sodium or reduced-sodium). Regular canned tomato sauce and paste (not low-sodium or reduced-sodium). Regular tomato and vegetable juice (not low-sodium or reduced-sodium). Angie Fava. Olives. Grains Baked goods made with fat, such as croissants, muffins, or some breads. Dry pasta or rice meal packs. Meats and other proteins Fatty cuts of meat. Ribs. Fried meat. Berniece Salines. Bologna, salami, and other precooked or cured meats, such as sausages or meat loaves. Fat from the back of a pig (fatback). Bratwurst. Salted nuts and seeds. Canned beans with added salt. Canned or smoked fish. Whole eggs or egg yolks. Chicken or [place] with skin. Dairy Whole or 2% milk, cream, and half-and-half. Whole or full-fat cream cheese. Whole-fat or sweetened yogurt. Full-fat cheese. Nondairy creamers. Whipped toppings. Processed cheese and cheese spreads. Fats and oils Butter. Stick margarine. Lard. Shortening. Ghee. Bacon fat.  Tropical oils, such as coconut, palm kernel, or palm oil. Seasonings and condiments Onion salt, garlic salt, seasoned salt, table salt, and sea salt. Worcestershire sauce. Tartar sauce. Barbecue sauce. Teriyaki sauce. Soy sauce, including reduced-sodium. Steak sauce. Canned and packaged gravies. Fish sauce. Oyster sauce. Cocktail sauce. Store-bought horseradish. Ketchup. Mustard. Meat flavorings and tenderizers. Bouillon cubes. Hot sauces. Pre-made or packaged marinades. Pre-made or packaged taco seasonings. Relishes. Regular salad dressings. Other foods Salted popcorn  and pretzels. The items listed above may not be a complete list of foods and beverages you should avoid. Contact a dietitian for more information. Where to find more information  National Heart, Lung, and Blood Institute: https://wilson-eaton.com/  American Heart Association: www.heart.org  Academy of Nutrition and Dietetics: www.eatright.[place]: www.kidney.org Summary  The DASH eating plan is a healthy eating plan that has been shown to reduce high blood pressure (hypertension). It may also reduce your risk for type 2 diabetes, heart disease, and stroke.  When on the DASH eating plan, aim to eat more fresh fruits and vegetables, whole grains, lean proteins, low-fat dairy, and heart-healthy fats.  With the DASH eating plan, you should limit salt (sodium) intake to 2,300 mg a day. If you have hypertension, you may need to reduce your sodium intake to 1,500 mg a day.  Work with your health care provider or dietitian to adjust your eating plan to your individual calorie needs. This information is not intended to replace advice given to you by your health care provider. Make sure you discuss any questions you have with your health care provider. Document Revised: 10/04/2019 Document Reviewed: 10/04/2019 Elsevier Patient Education  2021 [place].  Tobacco Use Disorder Tobacco use disorder (TUD) occurs when a person craves, seeks, and uses tobacco, regardless of the consequences. This disorder can cause problems with mental and physical health. It can affect your ability to have healthy relationships, and it can keep you from meeting your responsibilities at work, home, or school. Tobacco may be:  Smoked as a cigarette or cigar.  Inhaled using e-cigarettes.  Smoked in a pipe or hookah.  Chewed as smokeless tobacco.  Inhaled into the nostrils as snuff. Tobacco products contain a dangerous chemical called nicotine, which is very addictive. Nicotine triggers hormones  that make the body feel stimulated and works on areas of the brain that make you feel good. These effects can make it hard for people to quit nicotine. Tobacco contains many other unsafe chemicals that can damage almost every organ in the body. Smoking tobacco also puts others in danger due to fire risk and possible health problems caused by breathing in secondhand smoke. What are the signs or symptoms? Symptoms of TUD may include:  Being unable to slow down or stop your tobacco use.  Spending an abnormal amount of time getting or using tobacco.  Craving tobacco. Cravings may last for up to 6 months after quitting.  Tobacco use that: ? Interferes with your work, school, or home life. ? Interferes with your personal and social relationships. ? Makes you give up activities that you once enjoyed or found important.  Using tobacco even though you know that it is: ? Dangerous or bad for your health or someone else's health. ? Causing problems in your life.  Needing more and more of the substance to get the same effect (developing tolerance).  Experiencing unpleasant symptoms if you do not use the substance (withdrawal). Withdrawal symptoms may include: ? Depressed, anxious, or irritable mood. ?  Difficulty concentrating. ? Increased appetite. ? Restlessness or trouble sleeping.  Using the substance to avoid withdrawal. How is this diagnosed? This condition may be diagnosed based on:  Your current and past tobacco use. Your health care provider may ask questions about how your tobacco use affects your life.  A physical exam. You may be diagnosed with TUD if you have at least two symptoms within a 26-month period. How is this treated? This condition is treated by stopping tobacco use. Many people are unable to quit on their own and need help. Treatment may include:  Nicotine replacement therapy (NRT). NRT provides nicotine without the other harmful chemicals in tobacco. NRT gradually  lowers the dosage of nicotine in the body and reduces withdrawal symptoms. NRT is available as: ? Over-the-counter gums, lozenges, and skin patches. ? Prescription mouth inhalers and nasal sprays.  Medicine that acts on the brain to reduce cravings and withdrawal symptoms.  A type of talk therapy that examines your triggers for tobacco use, how to avoid them, and how to cope with cravings (behavioral therapy).  Hypnosis. This may help with withdrawal symptoms.  Joining a support group for others coping with TUD. The best treatment for TUD is usually a combination of medicine, talk therapy, and support groups. Recovery can be a long process. Many people start using tobacco again after stopping (relapse). If you relapse, it does not mean that treatment will not work. Follow these instructions at home: Lifestyle  Do not use any products that contain nicotine or tobacco, such as cigarettes and e-cigarettes.  Avoid things that trigger tobacco use as much as you can. Triggers include people and situations that usually cause you to use tobacco.  Avoid drinks that contain caffeine, including coffee. These may worsen some withdrawal symptoms.  Find ways to manage stress. Wanting to smoke may cause stress, and stress can make you want to smoke. Relaxation techniques such as deep breathing, meditation, and yoga may help.  Attend support groups as needed. These groups are an important part of long-term recovery for many people. General instructions  Take over-the-counter and prescription medicines only as told by your health care provider.  Check with your health care provider before taking any new prescription or over-the-counter medicines.  Decide on a friend, family member, or smoking quit-line (such as 1-800-QUIT-NOW in the [place] that you can call or text when you feel the urge to smoke or when you need help coping with cravings.  Keep all follow-up visits as told by your health care provider  and therapist. This is important.   Contact a health care provider if:  You are not able to take your medicines as prescribed.  Your symptoms get worse, even with treatment. Summary  Tobacco use disorder (TUD) occurs when a person craves, seeks, and uses tobacco regardless of the consequences.  This condition may be diagnosed based on your current and past tobacco use and a physical exam.  Many people are unable to quit on their own and need help. Recovery can be a long process.  The most effective treatment for TUD is usually a combination of medicine, talk therapy, and support groups. This information is not intended to replace advice given to you by your health care provider. Make sure you discuss any questions you have with your health care provider. Document Revised: 10/18/2017 Document Reviewed: 10/18/2017 Elsevier Patient Education  2021 [employer].

## 2020-11-23 NOTE — Assessment & Plan Note (Signed)
Colon cancer found on recent colonoscopy  Repeat colonoscopy this week to determine margins  Further follow-up and treatment per gastroenterology

## 2020-11-23 NOTE — Progress Notes (Signed)
Follow up- Spot on lung Discomfort with swallowing Discuss medication - Viagra Has to f/u with GI Specialist tomorrow from colonoscopy results.

## 2020-11-23 NOTE — Assessment & Plan Note (Signed)
Plan to increase dose of Viagra to 100 mg as needed

## 2020-11-23 NOTE — Assessment & Plan Note (Signed)
The positive Cologuard led to the colonoscopy which led to the diagnosis of colon cancer

## 2020-11-23 NOTE — Assessment & Plan Note (Signed)
COPD stable at this time continue albuterol as needed

## 2020-11-23 NOTE — Assessment & Plan Note (Signed)
The patient has successfully stopped smoking I continue to congratulate him in this regard

## 2020-11-23 NOTE — Assessment & Plan Note (Signed)
Right-sided inguinal hernia Referral to general surgery

## 2020-11-23 NOTE — Assessment & Plan Note (Signed)
Referral to dental clinic

## 2020-11-23 NOTE — Assessment & Plan Note (Signed)
History of elevated liver function test which are resolving

## 2020-11-23 NOTE — Assessment & Plan Note (Signed)
Hypertension not yet at goal  Plan to begin valsartan HCT 160/25 1 daily

## 2020-11-24 ENCOUNTER — Ambulatory Visit (AMBULATORY_SURGERY_CENTER): Payer: Medicare Other | Admitting: Gastroenterology

## 2020-11-24 ENCOUNTER — Encounter: Payer: Self-pay | Admitting: Gastroenterology

## 2020-11-24 ENCOUNTER — Other Ambulatory Visit: Payer: Self-pay

## 2020-11-24 VITALS — BP 161/86 | HR 64 | Temp 98.6°F | Resp 14 | Ht 71.0 in | Wt 166.0 lb

## 2020-11-24 DIAGNOSIS — D125 Benign neoplasm of sigmoid colon: Secondary | ICD-10-CM

## 2020-11-24 DIAGNOSIS — K633 Ulcer of intestine: Secondary | ICD-10-CM

## 2020-11-24 DIAGNOSIS — K5730 Diverticulosis of large intestine without perforation or abscess without bleeding: Secondary | ICD-10-CM | POA: Diagnosis not present

## 2020-11-24 DIAGNOSIS — K635 Polyp of colon: Secondary | ICD-10-CM | POA: Diagnosis not present

## 2020-11-24 DIAGNOSIS — C189 Malignant neoplasm of colon, unspecified: Secondary | ICD-10-CM

## 2020-11-24 MED ORDER — SODIUM CHLORIDE 0.9 % IV SOLN: 500.0000 mL | INTRAVENOUS | Status: DC

## 2020-11-24 MED FILL — VALSARTAN-HCTZ 160-25 MG TA: 160-25 | 90 days supply | Qty: 90 | Fill #0

## 2020-11-24 NOTE — Progress Notes (Signed)
To PACU, VSS. Report to [email or web address] 

## 2020-11-24 NOTE — Op Note (Signed)
[facility] Patient Name: Nori Kunselman Procedure Date: 11/24/2020 11:54 AM MRN: [id number] Endoscopist: Gerrit Heck , MD Age: 65 Referring MD:  Date of Birth: 1955/12/17 Gender: Male Account #: [id number] Procedure:                Flexible Sigmoidoscopy with biopsy and tattoo                            placement Indications:              Personal history of adenocarcinoma.                           Colonoscopy on 11/11/20 done for +Cologuard and                            notable for 40 mm pedunculated polyp in the sigmoid                            colon, removed via Endoloop and hot snare in                            piecemeal fashion. Pathology notable for tubular                            adenoma with high grade dysplasia and                            Adenocarcinoma. Adenomatous tissue was prolapsed                            into the stalk. He presents today for expedited                            repeat evaluation and tattoo placement. Medicines:                Monitored Anesthesia Care Procedure:                Pre-Anesthesia Assessment:                           - Prior to the procedure, a History and Physical                            was performed, and patient medications and                            allergies were reviewed. The patient's tolerance of                            previous anesthesia was also reviewed. The risks                            and benefits of the procedure and the sedation  options and risks were discussed with the patient.                            All questions were answered, and informed consent                            was obtained. Prior Anticoagulants: The patient has                            taken no previous anticoagulant or antiplatelet                            agents. ASA Grade Assessment: II - A patient with                            mild systemic disease. After reviewing the risks                             and benefits, the patient was deemed in                            satisfactory condition to undergo the procedure.                           After obtaining informed consent, the scope was                            passed under direct vision. The Olympus [id number]-[id number]                            (#[id number]) Colonoscope was introduced through the                            anus and advanced to the the left transverse colon.                            The flexible sigmoidoscopy was accomplished without                            difficulty. The patient tolerated the procedure                            well. The quality of the bowel preparation was fair. Scope In: Scope Out: Findings:                 The perianal and digital rectal examinations were                            normal.                           A single (solitary) ulcer was found in the sigmoid  colon consistent with prior polypectomy site. This                            was located approximately 24 cm from the anal                            verge. The ulcer was healing. Several mucosal                            biopsies were taken with a cold forceps for                            histology. Biopsies were taken from the ulcer as                            well as surrounding mucosa. Area 2 cm distal to the                            ulcer site was tattooed with an injection of 2 mL                            of Spot (carbon black). Estimated blood loss was                            minimal.                           A few small-mouthed diverticula were found in the                            sigmoid colon.                           A moderate amount of stool was found in the rectum,                            in the sigmoid colon, in the descending colon and                            in the distal transverse colon, interfering with                             visualization. Lavage of the area was performed                            using copious amounts of tap water, resulting in                            clearance with fair visualization. Complications:            No immediate complications. Estimated Blood Loss:     Estimated blood loss was minimal. Impression:               - Preparation of the colon  was fair.                           - A single (solitary) ulcer in the sigmoid colon.                            Biopsied. Tattooed.                           - Diverticulosis in the sigmoid colon.                           - Stool in the rectum, in the sigmoid colon, in the                            descending colon and in the distal transverse colon. Recommendation:           - Discharge patient to home (with escort).                           - Resume previous diet today.                           - Await pathology results.                           - Follow-up with Dr. Carlean Purl in the GI clinic at                            appointment to be scheduled. Depending on pathology                            results, recommend consideration of cross sectional                            imaging (CT abdomen/pelvis +/- MRI Pelvis) along                            with referral to [facility]. Gerrit Heck, MD 11/24/2020 12:29:47 PM

## 2020-11-24 NOTE — Patient Instructions (Signed)
Await pathology for final recommendations. Follow-up with Dr. Carlean Purl in the office.  The office will call you to schedule this.      YOU HAD AN ENDOSCOPIC PROCEDURE TODAY AT [facility]:   Refer to the procedure report that was given to you for any specific questions about what was found during the examination.  If the procedure report does not answer your questions, please call your gastroenterologist to clarify.  If you requested that your care partner not be given the details of your procedure findings, then the procedure report has been included in a sealed envelope for you to review at your convenience later.  YOU SHOULD EXPECT: Some feelings of bloating in the abdomen. Passage of more gas than usual.  Walking can help get rid of the air that was put into your GI tract during the procedure and reduce the bloating. If you had a lower endoscopy (such as a colonoscopy or flexible sigmoidoscopy) you may notice spotting of blood in your stool or on the toilet paper. If you underwent a bowel prep for your procedure, you may not have a normal bowel movement for a few days.  Please Note:  You might notice some irritation and congestion in your nose or some drainage.  This is from the oxygen used during your procedure.  There is no need for concern and it should clear up in a day or so.  SYMPTOMS TO REPORT IMMEDIATELY:   Following lower endoscopy (colonoscopy or flexible sigmoidoscopy):  Excessive amounts of blood in the stool  Significant tenderness or worsening of abdominal pains  Swelling of the abdomen that is new, acute  Fever of 100F or higher   For urgent or emergent issues, a gastroenterologist can be reached at any hour by calling [phone]. Do not use MyChart messaging for urgent concerns.    DIET:  We do recommend a small meal at first, but then you may proceed to your regular diet.  Drink plenty of fluids but you should avoid alcoholic beverages for 24  hours.  ACTIVITY:  You should plan to take it easy for the rest of today and you should NOT DRIVE or use heavy machinery until tomorrow (because of the sedation medicines used during the test).    FOLLOW UP: Our staff will call the number listed on your records 48-72 hours following your procedure to check on you and address any questions or concerns that you may have regarding the information given to you following your procedure. If we do not reach you, we will leave a message.  We will attempt to reach you two times.  During this call, we will ask if you have developed any symptoms of COVID 19. If you develop any symptoms (ie: fever, flu-like symptoms, shortness of breath, cough etc.) before then, please call [phone].  If you test positive for Covid 19 in the 2 weeks post procedure, please call and report this information to [place].    If any biopsies were taken you will be contacted by phone or by letter within the next 1-3 weeks.  Please call us at [phone] if you have not heard about the biopsies in 3 weeks.    SIGNATURES/CONFIDENTIALITY: You and/or your care partner have signed paperwork which will be entered into your electronic medical record.  These signatures attest to the fact that that the information above on your After Visit Summary has been reviewed and is understood.  Full responsibility of the confidentiality of this  discharge information lies with you and/or your care-partner. 

## 2020-11-25 MED FILL — SILDENAFIL CITRATE 100 MG T: 100 | 15 days supply | Qty: 5 | Fill #0

## 2020-11-26 ENCOUNTER — Telehealth: Payer: Self-pay

## 2020-11-26 NOTE — Telephone Encounter (Signed)
  Follow up Call-  Call back number 11/24/2020 11/11/2020  Post procedure Call Back phone  # [phone]  Permission to leave phone message Yes Yes  Some recent data might be hidden     Patient questions:  Do you have a fever, pain , or abdominal swelling? No. Pain Score  0 *  Have you tolerated food without any problems? Yes.    Have you been able to return to your normal activities? Yes.    Do you have any questions about your discharge instructions: Diet   No. Medications  No. Follow up visit  No.  Do you have questions or concerns about your Care? No.  Actions: * If pain score is 4 or above: 1. No action needed, pain <4.Have you developed a fever since your procedure? no  2.   Have you had an respiratory symptoms (SOB or cough) since your procedure? no  3.   Have you tested positive for COVID 19 since your procedure no  4.   Have you had any family members/close contacts diagnosed with the COVID 19 since your procedure?  no   If yes to any of these questions please route to Joylene John, RN and Joella Prince, RN

## 2020-12-09 ENCOUNTER — Other Ambulatory Visit: Payer: Self-pay

## 2020-12-09 NOTE — Progress Notes (Signed)
The proposed treatment discussed in conference is for discussion purposes only and is not a binding recommendation.  The patients have not been physically examined, or presented with their treatment options.  Therefore, final treatment plans cannot be decided.   

## 2020-12-11 ENCOUNTER — Telehealth: Payer: Self-pay | Admitting: Internal Medicine

## 2020-12-11 NOTE — Telephone Encounter (Signed)
Patient called requesting to schedule a post procedure follow up however he mentioned Dr. Carlean Purl recommending he make the appointment with him along with another provider not sure with whom to schedule with please advise

## 2020-12-11 NOTE — Telephone Encounter (Signed)
We are setting up appointments with Dr. Nadeen Landau of [facility] surgery and Dr. Rush Landmark of our practice.  This is in a work you on a result note that I sent to [facility] just yesterday I think.

## 2020-12-11 NOTE — Telephone Encounter (Signed)
See results notes for details.  

## 2021-01-12 ENCOUNTER — Other Ambulatory Visit: Payer: Self-pay

## 2021-01-12 ENCOUNTER — Encounter: Payer: Self-pay | Admitting: Critical Care Medicine

## 2021-01-12 ENCOUNTER — Other Ambulatory Visit: Payer: Self-pay | Admitting: Critical Care Medicine

## 2021-01-12 ENCOUNTER — Ambulatory Visit: Payer: Medicare Other | Attending: Critical Care Medicine | Admitting: Critical Care Medicine

## 2021-01-12 VITALS — BP 146/82 | HR 74 | Temp 98.0°F | Resp 18 | Ht 72.0 in | Wt 166.2 lb

## 2021-01-12 DIAGNOSIS — I10 Essential (primary) hypertension: Secondary | ICD-10-CM | POA: Diagnosis not present

## 2021-01-12 DIAGNOSIS — C187 Malignant neoplasm of sigmoid colon: Secondary | ICD-10-CM | POA: Diagnosis not present

## 2021-01-12 DIAGNOSIS — J449 Chronic obstructive pulmonary disease, unspecified: Secondary | ICD-10-CM

## 2021-01-12 DIAGNOSIS — N529 Male erectile dysfunction, unspecified: Secondary | ICD-10-CM | POA: Diagnosis not present

## 2021-01-12 DIAGNOSIS — Z87891 Personal history of nicotine dependence: Secondary | ICD-10-CM

## 2021-01-12 DIAGNOSIS — K4090 Unilateral inguinal hernia, without obstruction or gangrene, not specified as recurrent: Secondary | ICD-10-CM

## 2021-01-12 MED ORDER — VALSARTAN-HYDROCHLOROTHIAZIDE 160-25 MG PO TABS: 1.0000 | ORAL_TABLET | Freq: Every day | ORAL | 1 refills | Status: DC

## 2021-01-12 MED ORDER — SILDENAFIL CITRATE 100 MG PO TABS: 100.0000 mg | ORAL_TABLET | Freq: Every day | ORAL | 1 refills | Status: DC | PRN

## 2021-01-12 MED ORDER — AMLODIPINE BESYLATE 10 MG PO TABS: 10.0000 mg | ORAL_TABLET | Freq: Every day | ORAL | 3 refills | Status: DC

## 2021-01-12 MED FILL — AMLODIPINE BESYLATE 10 MG T: 10 | 30 days supply | Qty: 30 | Fill #0

## 2021-01-12 MED FILL — SILDENAFIL CITRATE 100 MG T: 100 | 15 days supply | Qty: 5 | Fill #0

## 2021-01-12 NOTE — Assessment & Plan Note (Signed)
Refill on Viagra given

## 2021-01-12 NOTE — Patient Instructions (Signed)
Refills on all medications sent to our pharmacy  Please keep your upcoming appointments with surgery  Please obtain your COVID booster vaccine  Please refrain from smoking prior to your upcoming surgeries  Return to see Dr. Joya Gaskins 4 months

## 2021-01-12 NOTE — Assessment & Plan Note (Addendum)
Continue valsartan HCT and amlodipine

## 2021-01-12 NOTE — Assessment & Plan Note (Signed)
Recommended to follow-up with gastroenterology on plan of care

## 2021-01-12 NOTE — Assessment & Plan Note (Signed)
Planning surgery per general surgery on right inguinal hernia

## 2021-01-12 NOTE — Assessment & Plan Note (Signed)
Patient smoking about a cigarette a week asked him to stop this completely he said he would comply

## 2021-01-12 NOTE — Progress Notes (Signed)
Subjective:    Patient ID: Jordan [name], male    DOB: 08/27/1956, 65 y.o.   MRN: [id number]  06/04/20  Patient is here to establish care and previously seen by her physician assistant in June after admission in May for multiple trauma after a fall from 20 feet while trying to trim a tree.  See documentation from previous visit After hospitalization 5/21-5/24/2021 after a fall and sustaining multiple fractures.  He has f/up with Dr Lucia Gaskins on June 14.  Sees Dr Megan Salon June 17th.  Running out of pain meds.  No fever.  No SOB.  Appetite is good.  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 Coronary artery calcifications  Discharge Diagnosis 20 foot fall from a tree  T1 wedge fx T2 pincer type deformity T3 superior endplate incomplete burst fx with 30mm retropulsion and mild canal stenosis R T3 TP f L1-2 L TP fx Possible posterior tension band disruption at T2-4 Left scapula fx R 4-11 rib fx L 3-11 rib fx Trace R apical pneumothorax Small R effusion, atelectasis/ pulm contusion Small volume free fluid in the pelvis  Incidental findings include but not limited to:  Multileval c/t/l spine degenerative changes Aortic atherosclerosis, 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 He reports shallow breathing since the incident,but developed worsening shortness of breath, chest pain and shoulder pain and contacted EMS. By report he was originally walking around but became dizzy and more lethargic en route. He was hypertensive up to 810 systolic on route, sats have remained in the mid 90s, no tachycardia.  He does smoke crack cocaine and has done so since his fall today.  Procedures None  Hospital Course: Patient was admitted to the trauma service for above diagnoses. Neurosurgery was consulted recommended non-op, cervical collar and follow up in the office. Orthopedics was consulted and recommended non-op, sling for comfort, and follow up in the office. Repeat CXR showed no evidence of PTX. Patient worked with therapies who recommended [place]. Per notes, ambulation improved and patient was walking in halls on 5/24 without o2. Patient was discharged home with below follow up.  Iwas not directly involved in this patient's care and did not see the patient during their hospital stay, therefore the information in this discharge summary was taken entirely from the chart.  I called the patient on 5/27 at 10:34 a.m. to notify him that we recommend follow up with his PCP for incidental3.4cm infrarenal AAAseen on CTRecommend followup by abdomen and pelvis CTA in 6 months. Patient does not have a PCP. I discussed with him to call the number on his Medicare card to establish care with a PCP. I also gave him [place] and wellness number. I gave him the number for Vascular Surgery as a  referral. I recommended follow up with Neurosurgery for his fx's and provided their phone number. I also provided the patient with [facility] offices number incase he has any further questions or concerns. I reiterated that he is to follow up with Dr. Lucia Gaskins of Orthopedics. He reports he has already made a follow up with  Dr. Lucia Gaskins.  Observations/Objective:  NAD.  Patient expresses anger "at system."   Assessment and Plan: 1. Fall, sequela - traMADol (ULTRAM) 50 MG tablet; Take 1 tablet (50 mg total) by mouth every 6 (six) hours as needed for up to 5 days.  Dispense: 30 tablet; Refill: 0  2. Closed wedge compression fracture of T1 vertebra with routine healing, subsequent encounter - traMADol (ULTRAM) 50 MG tablet; Take 1 tablet (50 mg total) by mouth every 6 (six) hours as needed for up to 5 days.  Dispense: 30 tablet; Refill: 0  3. Multiple closed fractures of ribs of both sides with delayed healing, subsequent encounter - traMADol (ULTRAM) 50 MG tablet; Take 1 tablet (50 mg total) by mouth every 6 (six) hours as needed for up to 5 days.  Dispense: 30 tablet; Refill: 0  My nurse is calling to see if we can get him appts sooner with ortho and neurosurgery.  I have advised him to go to ED/UC if anything worsens or pain is not manageable.  Patient verbalizes understanding.   Patient currently is doing much better he has had follow-up with orthopedic he is now off all pain medication his fractures are nonrevealing.  He is more ambulatory.  He is back at work.  The patient has a former history of heavy alcohol use but has not had any alcohol use since the early 2000's.  He smoked heavily but has quit smoking since his injuries. Patient does note he has daily cough and some chronic dyspnea on exertion  08/11/2020 Patient returns for primary care follow-up he is back working as a Development worker, international aid he is trying to avoid climbing tall ladders he still has a knot above his left eyebrow he is questioning he is yet to turn in his Cologuard test he needs dental work and this is yet to be completed. Patient on arrival is 186/99 blood pressure previous blood pressure levels have been elevated when he was in the hospital. Note he does have a hepatitis C antibody is positive but no significant viral loads. He had elevated liver  function test previously this needs to be rechecked. Patient is no longer smoking he does have wheezing and chest x-ray showed COPD changes at the last visit he is only on the albuterol inhaler as needed  Patient does have erectile dysfunction and is requesting medication for this there is no contraindication to medication for erectile dysfunction in this patient The patient is yet to turn in his Cologuard test ordered at the last visit  11/23/20: This patient is seen in return follow-up and had a positive Cologuard which resulted in colonoscopy showing pedunculated colon cancer in the colon.  He now is to have repeat procedure in this week to ensure the margins are clear.  Patient is also healed up from his fall with fracture of the left clavicle and multiple right ribs.  Patient does state he has minimal swallowing issues.  The patient also has a hernia in the right groin inguinal area and is scheduled for evaluation by general surgery on the 17th of this month.  The patient is using albuterol as needed for his breathing and has no  other complaints at this time.  Patient continues to struggle with erectile dysfunction 50 mg of Viagra was insufficient and he is asking for an increased dose   01/12/2021 Patient is seen today in return follow-up for hypertension COPD.  Patient states his breathing is at baseline he is smoking only 1 to 2 cigarettes a week.  He is planning to have inguinal hernia surgery done within the next week.  He is being evaluated by general surgery and gastroenterology for potential sigmoid cancer resection procedure.  He has follow-up visits with gastroenterology and surgery in this regard as well as he does have colon cancer in the sigmoid colon.  Patient is asking for refill on the Viagra.  He has no other complaints.     Review of Systems  HENT: Positive for dental problem.   Respiratory: Negative for cough, chest tightness, shortness of breath and wheezing.    Cardiovascular: Negative for chest pain, palpitations and leg swelling.  Gastrointestinal: Negative.   Genitourinary: Negative.   Musculoskeletal: Negative.        Objective:   Physical Exam Vitals:   01/12/21 1355  BP: (!) 146/82  Pulse: 74  Resp: 18  Temp: 98 F (36.7 C)  TempSrc: Oral  SpO2: 94%  Weight: 166 lb 3.2 oz (75.4 kg)  Height: 6' (1.829 m)    Gen: Pleasant, well-nourished, in no distress,  normal affect  ENT: No lesions,  mouth clear,  oropharynx clear, no postnasal drip,   There is very poor dentition in the right upper gum Neck: No JVD, no TMG, no carotid bruits  Lungs: No use of accessory muscles, no dullness to percussion, few scattered rhonchi and prolonged expiratory phase Cardiovascular: RRR, heart sounds normal, no murmur or gallops, no peripheral edema  Abdomen: soft and NT, no HSM,  BS normal, right inguinal hernia is present  Musculoskeletal: No deformities, no cyanosis or clubbing  Neuro: alert, non focal  Skin: Warm, no lesions or rashes      Assessment & Plan:  I personally reviewed all images and lab data in the [facility] system as well as any outside material available during this office visit and agree with the  radiology impressions.   COPD with chronic bronchitis ([place]) Refill on albuterol given  Hypertension Continue valsartan HCT and amlodipine  Colon cancer ([place]) Recommended to follow-up with gastroenterology on plan of care  Erectile dysfunction Refill on Viagra given  Unilateral inguinal hernia without obstruction or gangrene Planning surgery per general surgery on right inguinal hernia   Hilery was seen today for follow-up.  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 -     sildenafil (VIAGRA) 100 MG tablet; Take 1 tablet (100 mg total) by mouth daily as needed for erectile dysfunction. -     valsartan-hydrochlorothiazide (DIOVAN HCT) 160-25 MG tablet; Take 1 tablet by mouth daily.

## 2021-01-12 NOTE — Assessment & Plan Note (Signed)
Refill on albuterol given

## 2021-01-13 ENCOUNTER — Other Ambulatory Visit: Payer: Self-pay

## 2021-01-13 ENCOUNTER — Encounter (HOSPITAL_BASED_OUTPATIENT_CLINIC_OR_DEPARTMENT_OTHER): Payer: Self-pay | Admitting: Surgery

## 2021-01-13 MED FILL — VALSARTAN-HCTZ 160-25 MG TA: 160-25 | 30 days supply | Qty: 30 | Fill #0

## 2021-01-13 NOTE — Progress Notes (Addendum)
Spoke w/ via phone for pre-op interview---pt Lab needs dos---- none              Lab results------has lab appt 01-18-2021 930 am for cbc cmp and repeat ekg, chest xray 06-05-2020 epic, abnormal ekg 04-03-2020 epic COVID test ------01-18-2021 1100 Arrive at -------1030 am 01-19-2021 NPO after MN NO Solid Food.   water from MN until---930 am then npo Medications to take morning of surgery -----amlodipine, use/ bring inhaler Diabetic medication -----n/a Patient Special Instructions -----none Pre-Op special Istructions -----none Patient verbalized understanding of instructions that were given at this phone interview. Patient denies shortness of breath, chest pain, fever, cough at this phone interview.  Ask mda day of surgery if urine drug screen needed last cocaine use 12-30-2020 per patient.

## 2021-01-14 NOTE — Progress Notes (Signed)
Reviewed pre op instructions with mother peggy Ricchio per pt request

## 2021-01-15 ENCOUNTER — Ambulatory Visit: Payer: Medicare Other | Admitting: Gastroenterology

## 2021-01-15 ENCOUNTER — Other Ambulatory Visit: Payer: Self-pay | Admitting: Gastroenterology

## 2021-01-15 ENCOUNTER — Encounter: Payer: Self-pay | Admitting: Gastroenterology

## 2021-01-15 VITALS — BP 130/70 | HR 85 | Ht 72.0 in | Wt 165.0 lb

## 2021-01-15 DIAGNOSIS — R933 Abnormal findings on diagnostic imaging of other parts of digestive tract: Secondary | ICD-10-CM | POA: Diagnosis not present

## 2021-01-15 DIAGNOSIS — D125 Benign neoplasm of sigmoid colon: Secondary | ICD-10-CM | POA: Diagnosis not present

## 2021-01-15 DIAGNOSIS — C189 Malignant neoplasm of colon, unspecified: Secondary | ICD-10-CM

## 2021-01-15 MED ORDER — NA SULFATE-K SULFATE-MG SULF 17.5-3.13-1.6 GM/177ML PO SOLN: 1.0000 | Freq: Once | ORAL | 0 refills | Status: DC

## 2021-01-15 MED FILL — SUPREP BOWEL PREP KIT: 17.5-3.13-1 | 30 days supply | Qty: 354 | Fill #0

## 2021-01-15 NOTE — Patient Instructions (Signed)
If you are age 66 or older, your body mass index should be between 23-30. Your Body mass index is 22.38 kg/m. If this is out of the aforementioned range listed, please consider follow up with your Primary Care Provider.  We have sent the following medications to your pharmacy for you to pick up at your convenience: [place] have been scheduled for a flexible sigmoidoscopy +EMR. Please follow the written instructions given to you at your visit today. If you use inhalers (even only as needed), please bring them with you on the day of your procedure.   Due to recent changes in healthcare laws, you may see the results of your imaging and laboratory studies on MyChart before your provider has had a chance to review them.  We understand that in some cases there may be results that are confusing or concerning to you. Not all laboratory results come back in the same time frame and the provider may be waiting for multiple results in order to interpret others.  Please give [place] 48 hours in order for your provider to thoroughly review all the results before contacting the office for clarification of your results.   Thank you for choosing me and [facility].  Dr. Rush Landmark

## 2021-01-16 ENCOUNTER — Other Ambulatory Visit (HOSPITAL_COMMUNITY): Payer: Medicare Other

## 2021-01-18 ENCOUNTER — Other Ambulatory Visit: Payer: Self-pay

## 2021-01-18 ENCOUNTER — Other Ambulatory Visit (HOSPITAL_COMMUNITY)
Admission: RE | Admit: 2021-01-18 | Discharge: 2021-01-18 | Disposition: A | Discharge status: Home / Self Care | Payer: Medicare Other | Source: Ambulatory Visit | Attending: Surgery | Admitting: Surgery

## 2021-01-18 ENCOUNTER — Encounter (HOSPITAL_COMMUNITY)
Admission: RE | Admit: 2021-01-18 | Discharge: 2021-01-18 | Disposition: A | Discharge status: Home / Self Care | Payer: Medicare Other | Source: Ambulatory Visit | Attending: Surgery | Admitting: Surgery

## 2021-01-18 DIAGNOSIS — R9431 Abnormal electrocardiogram [ECG] [EKG]: Secondary | ICD-10-CM | POA: Diagnosis not present

## 2021-01-18 DIAGNOSIS — Z20822 Contact with and (suspected) exposure to covid-19: Secondary | ICD-10-CM | POA: Insufficient documentation

## 2021-01-18 DIAGNOSIS — Z01818 Encounter for other preprocedural examination: Secondary | ICD-10-CM | POA: Insufficient documentation

## 2021-01-18 DIAGNOSIS — Z01812 Encounter for preprocedural laboratory examination: Secondary | ICD-10-CM | POA: Insufficient documentation

## 2021-01-18 LAB — COMPREHENSIVE METABOLIC PANEL
ALT: 48 U/L — ABNORMAL HIGH (ref 0–44)
AST: 36 U/L (ref 15–41)
Albumin: 3.9 g/dL (ref 3.5–5.0)
Alkaline Phosphatase: 70 U/L (ref 38–126)
Anion gap: 8 (ref 5–15)
BUN: 36 mg/dL — ABNORMAL HIGH (ref 8–23)
CO2: 26 mmol/L (ref 22–32)
Calcium: 8.9 mg/dL (ref 8.9–10.3)
Chloride: 103 mmol/L (ref 98–111)
Creatinine, Ser: 1.54 mg/dL — ABNORMAL HIGH (ref 0.61–1.24)
GFR, Estimated: 50 mL/min — ABNORMAL LOW (ref >60)
Glucose, Bld: 100 mg/dL — ABNORMAL HIGH (ref 70–99)
Potassium: 4.1 mmol/L (ref 3.5–5.1)
Sodium: 137 mmol/L (ref 135–145)
Total Bilirubin: 0.6 mg/dL (ref 0.3–1.2)
Total Protein: 7.2 g/dL (ref 6.5–8.1)

## 2021-01-18 LAB — CBC
HCT: 49.6 % (ref 39.0–52.0)
Hemoglobin: 16.6 g/dL (ref 13.0–17.0)
MCH: 30.1 pg (ref 26.0–34.0)
MCHC: 33.5 g/dL (ref 30.0–36.0)
MCV: 90 fL (ref 80.0–100.0)
Platelets: 233 10*3/uL (ref 150–400)
RBC: 5.51 MIL/uL (ref 4.22–5.81)
RDW: 13.2 % (ref 11.5–15.5)
WBC: 4.4 10*3/uL (ref 4.0–10.5)
nRBC: 0 % (ref 0.0–0.2)

## 2021-01-18 LAB — SARS CORONAVIRUS 2 (TAT 6-24 HRS): SARS Coronavirus 2: NEGATIVE

## 2021-01-19 ENCOUNTER — Encounter (HOSPITAL_BASED_OUTPATIENT_CLINIC_OR_DEPARTMENT_OTHER): Payer: Self-pay | Admitting: Surgery

## 2021-01-19 ENCOUNTER — Encounter (HOSPITAL_BASED_OUTPATIENT_CLINIC_OR_DEPARTMENT_OTHER)
Admission: RE | Disposition: A | Discharge status: Home / Self Care | Payer: Self-pay | Source: Home / Self Care | Attending: Surgery

## 2021-01-19 ENCOUNTER — Encounter: Payer: Self-pay | Admitting: Gastroenterology

## 2021-01-19 ENCOUNTER — Ambulatory Visit (HOSPITAL_BASED_OUTPATIENT_CLINIC_OR_DEPARTMENT_OTHER)
Admission: RE | Admit: 2021-01-19 | Discharge: 2021-01-19 | Disposition: A | Discharge status: Home / Self Care | Payer: Medicare Other | Attending: Surgery | Admitting: Surgery

## 2021-01-19 ENCOUNTER — Other Ambulatory Visit (HOSPITAL_BASED_OUTPATIENT_CLINIC_OR_DEPARTMENT_OTHER): Payer: Self-pay | Admitting: Surgery

## 2021-01-19 ENCOUNTER — Ambulatory Visit (HOSPITAL_BASED_OUTPATIENT_CLINIC_OR_DEPARTMENT_OTHER): Payer: Medicare Other | Admitting: Anesthesiology

## 2021-01-19 ENCOUNTER — Other Ambulatory Visit: Payer: Self-pay

## 2021-01-19 DIAGNOSIS — J449 Chronic obstructive pulmonary disease, unspecified: Secondary | ICD-10-CM | POA: Insufficient documentation

## 2021-01-19 DIAGNOSIS — D176 Benign lipomatous neoplasm of spermatic cord: Secondary | ICD-10-CM | POA: Diagnosis not present

## 2021-01-19 DIAGNOSIS — C189 Malignant neoplasm of colon, unspecified: Secondary | ICD-10-CM | POA: Insufficient documentation

## 2021-01-19 DIAGNOSIS — K4090 Unilateral inguinal hernia, without obstruction or gangrene, not specified as recurrent: Secondary | ICD-10-CM | POA: Insufficient documentation

## 2021-01-19 DIAGNOSIS — R933 Abnormal findings on diagnostic imaging of other parts of digestive tract: Secondary | ICD-10-CM | POA: Insufficient documentation

## 2021-01-19 DIAGNOSIS — Z809 Family history of malignant neoplasm, unspecified: Secondary | ICD-10-CM | POA: Insufficient documentation

## 2021-01-19 DIAGNOSIS — Z87891 Personal history of nicotine dependence: Secondary | ICD-10-CM | POA: Diagnosis not present

## 2021-01-19 DIAGNOSIS — Z800 Family history of malignant neoplasm of digestive organs: Secondary | ICD-10-CM | POA: Diagnosis not present

## 2021-01-19 DIAGNOSIS — Z79899 Other long term (current) drug therapy: Secondary | ICD-10-CM | POA: Diagnosis not present

## 2021-01-19 DIAGNOSIS — I714 Abdominal aortic aneurysm, without rupture: Secondary | ICD-10-CM | POA: Diagnosis not present

## 2021-01-19 DIAGNOSIS — Z8371 Family history of colonic polyps: Secondary | ICD-10-CM | POA: Insufficient documentation

## 2021-01-19 DIAGNOSIS — D125 Benign neoplasm of sigmoid colon: Secondary | ICD-10-CM | POA: Insufficient documentation

## 2021-01-19 HISTORY — DX: Family history of other specified conditions: Z84.89

## 2021-01-19 HISTORY — DX: Malignant neoplasm of colon, unspecified: C18.9

## 2021-01-19 HISTORY — PX: INGUINAL HERNIA REPAIR: SHX194

## 2021-01-19 HISTORY — DX: Abdominal aortic aneurysm, without rupture: I71.4

## 2021-01-19 HISTORY — DX: Inflammatory liver disease, unspecified: K75.9

## 2021-01-19 HISTORY — DX: Unilateral inguinal hernia, without obstruction or gangrene, not specified as recurrent: K40.90

## 2021-01-19 HISTORY — DX: Presence of spectacles and contact lenses: Z97.3

## 2021-01-19 HISTORY — DX: Infrarenal abdominal aortic aneurysm, without rupture: I71.43

## 2021-01-19 HISTORY — DX: Gastro-esophageal reflux disease without esophagitis: K21.9

## 2021-01-19 SURGERY — REPAIR, HERNIA, INGUINAL, LAPAROSCOPIC
Anesthesia: General | Site: Inguinal | Laterality: Right

## 2021-01-19 MED ORDER — ROCURONIUM BROMIDE 10 MG/ML (PF) SYRINGE
PREFILLED_SYRINGE | INTRAVENOUS | Status: AC
  Filled 2021-01-19: qty 10

## 2021-01-19 MED ORDER — OXYCODONE HCL 5 MG PO TABS
5.0000 mg | ORAL_TABLET | Freq: Once | ORAL | Status: AC | PRN
  Administered 2021-01-19: 5 mg via ORAL

## 2021-01-19 MED ORDER — OXYCODONE HCL 5 MG PO TABS
ORAL_TABLET | ORAL | Status: AC
  Filled 2021-01-19: qty 1

## 2021-01-19 MED ORDER — MIDAZOLAM HCL 2 MG/2ML IJ SOLN
INTRAMUSCULAR | Status: AC
  Filled 2021-01-19: qty 2

## 2021-01-19 MED ORDER — ACETAMINOPHEN 10 MG/ML IV SOLN
INTRAVENOUS | Status: AC
  Filled 2021-01-19: qty 100

## 2021-01-19 MED ORDER — PROPOFOL 10 MG/ML IV BOLUS
INTRAVENOUS | Status: DC | PRN
  Administered 2021-01-19: 110 mg via INTRAVENOUS

## 2021-01-19 MED ORDER — PHENYLEPHRINE 40 MCG/ML (10ML) SYRINGE FOR IV PUSH (FOR BLOOD PRESSURE SUPPORT)
PREFILLED_SYRINGE | INTRAVENOUS | Status: DC | PRN
  Administered 2021-01-19: 80 ug via INTRAVENOUS
  Administered 2021-01-19: 120 ug via INTRAVENOUS

## 2021-01-19 MED ORDER — DEXAMETHASONE SODIUM PHOSPHATE 10 MG/ML IJ SOLN
INTRAMUSCULAR | Status: DC | PRN
  Administered 2021-01-19: 10 mg via INTRAVENOUS

## 2021-01-19 MED ORDER — ACETAMINOPHEN 10 MG/ML IV SOLN
INTRAVENOUS | Status: DC | PRN
  Administered 2021-01-19: 1000 mg via INTRAVENOUS

## 2021-01-19 MED ORDER — HYDROMORPHONE HCL 1 MG/ML IJ SOLN
INTRAMUSCULAR | Status: AC
  Filled 2021-01-19: qty 1

## 2021-01-19 MED ORDER — BUPIVACAINE LIPOSOME 1.3 % IJ SUSP
INTRAMUSCULAR | Status: DC | PRN
  Administered 2021-01-19: 20 mL

## 2021-01-19 MED ORDER — FENTANYL CITRATE (PF) 100 MCG/2ML IJ SOLN
INTRAMUSCULAR | Status: DC | PRN
  Administered 2021-01-19: 100 ug via INTRAVENOUS

## 2021-01-19 MED ORDER — PROPOFOL 10 MG/ML IV BOLUS
INTRAVENOUS | Status: AC
  Filled 2021-01-19: qty 20

## 2021-01-19 MED ORDER — HYDROMORPHONE HCL 1 MG/ML IJ SOLN
0.2500 mg | INTRAMUSCULAR | Status: DC | PRN
  Administered 2021-01-19 (×2): 0.5 mg via INTRAVENOUS

## 2021-01-19 MED ORDER — OXYCODONE-ACETAMINOPHEN 5-325 MG PO TABS: 1.0000 | ORAL_TABLET | ORAL | 0 refills | Status: DC | PRN

## 2021-01-19 MED ORDER — CEFAZOLIN SODIUM-DEXTROSE 2-3 GM-%(50ML) IV SOLR
INTRAVENOUS | Status: DC | PRN
  Administered 2021-01-19: 2 g via INTRAVENOUS

## 2021-01-19 MED ORDER — CEFAZOLIN SODIUM-DEXTROSE 2-4 GM/100ML-% IV SOLN
INTRAVENOUS | Status: AC
  Filled 2021-01-19: qty 100

## 2021-01-19 MED ORDER — LIDOCAINE 2% (20 MG/ML) 5 ML SYRINGE
INTRAMUSCULAR | Status: AC
  Filled 2021-01-19: qty 5

## 2021-01-19 MED ORDER — OXYCODONE HCL 5 MG/5ML PO SOLN: 5.0000 mg | Freq: Once | ORAL | Status: AC | PRN

## 2021-01-19 MED ORDER — ONDANSETRON HCL 4 MG/2ML IJ SOLN
INTRAMUSCULAR | Status: DC | PRN
  Administered 2021-01-19: 4 mg via INTRAVENOUS

## 2021-01-19 MED ORDER — LIDOCAINE 2% (20 MG/ML) 5 ML SYRINGE
INTRAMUSCULAR | Status: DC | PRN
  Administered 2021-01-19: 100 mg via INTRAVENOUS

## 2021-01-19 MED ORDER — DEXAMETHASONE SODIUM PHOSPHATE 10 MG/ML IJ SOLN
INTRAMUSCULAR | Status: AC
  Filled 2021-01-19: qty 1

## 2021-01-19 MED ORDER — ROCURONIUM BROMIDE 10 MG/ML (PF) SYRINGE
PREFILLED_SYRINGE | INTRAVENOUS | Status: DC | PRN
  Administered 2021-01-19: 50 mg via INTRAVENOUS

## 2021-01-19 MED ORDER — ONDANSETRON HCL 4 MG/2ML IJ SOLN
INTRAMUSCULAR | Status: AC
  Filled 2021-01-19: qty 2

## 2021-01-19 MED ORDER — PROMETHAZINE HCL 25 MG/ML IJ SOLN: 6.2500 mg | INTRAMUSCULAR | Status: DC | PRN

## 2021-01-19 MED ORDER — FENTANYL CITRATE (PF) 250 MCG/5ML IJ SOLN
INTRAMUSCULAR | Status: AC
  Filled 2021-01-19: qty 5

## 2021-01-19 MED ORDER — SUGAMMADEX SODIUM 200 MG/2ML IV SOLN
INTRAVENOUS | Status: DC | PRN
  Administered 2021-01-19: 200 mg via INTRAVENOUS

## 2021-01-19 MED ORDER — LACTATED RINGERS IV SOLN: INTRAVENOUS | Status: DC

## 2021-01-19 MED ORDER — BUPIVACAINE HCL 0.5 % IJ SOLN
INTRAMUSCULAR | Status: DC | PRN
  Administered 2021-01-19: 30 mL

## 2021-01-19 MED ORDER — MIDAZOLAM HCL 5 MG/5ML IJ SOLN
INTRAMUSCULAR | Status: DC | PRN
  Administered 2021-01-19: 2 mg via INTRAVENOUS

## 2021-01-19 MED ORDER — IBUPROFEN 800 MG PO TABS: 800.0000 mg | ORAL_TABLET | Freq: Three times a day (TID) | ORAL | 0 refills | Status: DC

## 2021-01-19 MED FILL — IBUPROFEN 800 MG TABLET: 800 | 7 days supply | Qty: 21 | Fill #0

## 2021-01-19 SURGICAL SUPPLY — 42 items
ADH SKN CLS APL DERMABOND .7 (GAUZE/BANDAGES/DRESSINGS) ×1
APL PRP STRL LF DISP 70% ISPRP (MISCELLANEOUS) ×1
BLADE CLIPPER SENSICLIP SURGIC (BLADE) ×2 IMPLANT
CABLE HIGH FREQUENCY MONO STRZ (ELECTRODE) ×2 IMPLANT
CHLORAPREP W/TINT 26 (MISCELLANEOUS) ×2 IMPLANT
CLIPPER FILTER TUBING DISPOS (CLIP) ×2
COVER WAND RF STERILE (DRAPES) ×2 IMPLANT
DECANTER SPIKE VIAL GLASS SM (MISCELLANEOUS) IMPLANT
DERMABOND ADVANCED (GAUZE/BANDAGES/DRESSINGS) ×1
DERMABOND ADVANCED .7 DNX12 (GAUZE/BANDAGES/DRESSINGS) ×1 IMPLANT
ELECT REM PT RETURN 9FT ADLT (ELECTROSURGICAL) ×2
ELECTRODE REM PT RTRN 9FT ADLT (ELECTROSURGICAL) ×1 IMPLANT
FILTER TUBING DISPOS CLIPPER (CLIP) ×1 IMPLANT
GLOVE SURG ENC MOIS LTX SZ7 (GLOVE) ×2 IMPLANT
GLOVE SURG POLYISO LF SZ6.5 (GLOVE) ×4 IMPLANT
GLOVE SURG POLYISO LF SZ7 (GLOVE) ×4 IMPLANT
GLOVE SURG POLYISO LF SZ7.5 (GLOVE) ×2 IMPLANT
GLOVE SURG UNDER POLY LF SZ7 (GLOVE) ×4 IMPLANT
GLOVE SURG UNDER POLY LF SZ7.5 (GLOVE) ×2 IMPLANT
GOWN STRL REUS W/ TWL XL LVL3 (GOWN DISPOSABLE) ×1 IMPLANT
GOWN STRL REUS W/TWL LRG LVL3 (GOWN DISPOSABLE) ×6 IMPLANT
GOWN STRL REUS W/TWL XL LVL3 (GOWN DISPOSABLE) ×2
GRASPER SUT TROCAR 14GX15 (MISCELLANEOUS) ×2 IMPLANT
IRRIG SUCT STRYKERFLOW 2 WTIP (MISCELLANEOUS) ×2
IRRIGATION SUCT STRKRFLW 2 WTP (MISCELLANEOUS) ×1 IMPLANT
KIT TURNOVER CYSTO (KITS) ×2 IMPLANT
MESH 3DMAX 5X7 RT XLRG (Mesh General) ×2 IMPLANT
NEEDLE INSUFFLATION 120MM (ENDOMECHANICALS) ×2 IMPLANT
PACK BASIN DAY SURGERY FS (CUSTOM PROCEDURE TRAY) ×2 IMPLANT
PAD POSITIONING PINK XL (MISCELLANEOUS) ×2 IMPLANT
RELOAD STAPLE HERNIA 4.0 BLUE (INSTRUMENTS) IMPLANT
SCISSORS LAP 5X35 DISP (ENDOMECHANICALS) ×2 IMPLANT
SET TUBE SMOKE EVAC HIGH FLOW (TUBING) ×2 IMPLANT
STAPLER HERNIA 12 8.5 360D (INSTRUMENTS) ×2 IMPLANT
SUT MNCRL AB 4-0 PS2 18 (SUTURE) ×2 IMPLANT
SUT VICRYL 0 UR6 27IN ABS (SUTURE) IMPLANT
TOWEL OR 17X26 10 PK STRL BLUE (TOWEL DISPOSABLE) ×2 IMPLANT
TRAY FOL W/BAG SLVR 16FR STRL (SET/KITS/TRAYS/PACK) IMPLANT
TRAY FOLEY W/BAG SLVR 16FR LF (SET/KITS/TRAYS/PACK)
TRAY LAPAROSCOPIC (CUSTOM PROCEDURE TRAY) ×2 IMPLANT
TROCAR BLADELESS OPT 12M 100M (ENDOMECHANICALS) ×2 IMPLANT
TROCAR BLADELESS OPT 5 100 (ENDOMECHANICALS) ×4 IMPLANT

## 2021-01-19 NOTE — Op Note (Signed)
Patient: Jordan [name] (02/24/1956, [id number])  Date of Surgery: 01/19/2021   Preoperative Diagnosis: RIGHT INGUINAL HERNIA   Postoperative Diagnosis: RIGHT INGUINAL HERNIA   Surgical Procedure: LAPAROSCOPIC RIGHT INGUINAL HERNIA REPAIR WITH MESH:    Operative Team Members:  Surgeon(s) and Role:    * Emersyn Kotarski, Nickola Major, MD - Primary   Anesthesiologist: Nolon Nations, MD CRNA: Mechele Claude, CRNA; Rogers Blocker, CRNA   Anesthesia: General   Fluids:  Total I/O In: 100 [IV [id number]] Out: -   Complications: None  Drains:  None  Specimen: None  Disposition:  PACU - hemodynamically stable.  Plan of Care: Discharge to home after PACU  Indications for Procedure: Jordan [name] is a 65 y.o. male who presented with a right inguinal hernia.  The procedure itself as well as its risks, benefits and alternatives were discussed with the patient who granted consent to proceed.  Findings:  Technique: Transabdominal preperitoneal (TAPP) Hernia Location: Right inguinal indirect hernia and lipoma of the cord Mesh Size &Type:  Right indirect inguinal medium size with lipoma of the cord Mesh Fixation: Endo-Universal hernia stapler   Description of Procedure:  The patient was positioned supine, padded and secured to the bed, with both arms tucked.  The abdomen was widely prepped and draped.  A time out procedure was performed.  A 1 cm infraumbilical incision was made.  The abdomen was entered utilizing a Veress needle at the umbilical stalk, using a kocher clamp to elevate the umbilical stalk.  The abdomen was insufflated to 15 mm of Hg.  A blunt 12 mm trocar was inserted using the optical techinque.  Additional 5 mm trocars were placed in the left and right abdomen.  There was an INDIRECT hernia and lipoma of the cord on the RIGHT.  Utilizing a transabdominal pre peritoneal technique (TAPP), a horizontal incision was made in the peritoneum, immediately below the  umbilicus.  Dissection was carried out in the pre peritoneal space down to the level of the hernia sac which was reduced into the peritoneal cavity completely.  The cord contents were parietalized and preserved.  A large pre peritoneal dissection was performed to uncover the direct, indirect, femoral and obturator spaces.  Cooper's ligament was uncovered medially and the psoas muscle uncovered laterally.  The mesh, as documented above, was opened and advanced into the pre peritoneal position so that it more than adequately covered the indirect, direct, femoral and obturator spaces.  The mesh laid flat, with no inferior folds and covered the entire myopectineal orifice.  The mesh was fixated with the endo-universal hernia stapler to Cooper's ligament and the posterior aspect of the rectus muscle.  The peritoneal flap was closed with the same device.  There were no peritoneal defects or exposed mesh at the conclusion.  There was an a dimple of the peritoneum on the left but he has had no symptoms in this area so it was not repaired during this surgery.  The mesh, as documented above, was opened and advanced into the pre peritoneal position so that it more than adequately covered the indirect, direct, femoral and obturator spaces.  The mesh laid flat, with no inferior folds and covered the entire myopectineal orifice.  The mesh was fixated with the endo-universal hernia stapler to Cooper's ligament and the posterior aspect of the rectus muscle.  The peritoneal flap was closed with the same device.  There were no peritoneal defects or exposed mesh at the conclusion.  The umbilical trocar was  removed and the fascial defect was closed with a figure of eight 0 Vicryl suture, utilizing a suture passer.  The peritoneal cavity was completely desufflated, the trocars removed and the skin closed with 4-0 Monocryl subcuticular suture and skin glue.  All sponge and needle counts were correct at the end of the  case.  Louanna Raw, MD General, Bariatric, & Minimally Invasive Surgery [facility], [place]

## 2021-01-19 NOTE — Anesthesia Postprocedure Evaluation (Signed)
Anesthesia Post Note  Patient: Jordan [name]  Procedure(s) Performed: LAPAROSCOPIC RIGHT INGUINAL HERNIA REPAIR WITH MESH (Right Inguinal)     Patient location during evaluation: PACU Anesthesia Type: General Level of consciousness: sedated and patient cooperative Pain management: pain level controlled Vital Signs Assessment: post-procedure vital signs reviewed and stable Respiratory status: spontaneous breathing Cardiovascular status: stable Anesthetic complications: no   No complications documented.  Last Vitals:  Vitals:   01/19/21 1045 01/19/21 1100  BP: (!) 182/91 (!) 171/88  Pulse: 78 76  Resp: 18 12  Temp:    SpO2: 96% 96%    Last Pain:  Vitals:   01/19/21 1100  TempSrc:   PainSc: [place]

## 2021-01-19 NOTE — Anesthesia Procedure Notes (Signed)
Procedure Name: Intubation Date/Time: 01/19/2021 9:11 AM Performed by: Rogers Blocker, CRNA Pre-anesthesia Checklist: Patient identified, Emergency Drugs available, Suction available and Patient being monitored Patient Re-evaluated:Patient Re-evaluated prior to induction Oxygen Delivery Method: Circle System Utilized Preoxygenation: Pre-oxygenation with 100% oxygen Induction Type: IV induction Ventilation: Mask ventilation without difficulty Laryngoscope Size: Mac and 4 Grade View: Grade I Tube type: Oral Tube size: 7.5 mm Number of attempts: 1 Airway Equipment and Method: Stylet Placement Confirmation: ETT inserted through vocal cords under direct vision,  positive ETCO2 and breath sounds checked- equal and bilateral Secured at: 22 cm Tube secured with: Tape Dental Injury: Teeth and Oropharynx as per pre-operative assessment

## 2021-01-19 NOTE — Progress Notes (Addendum)
Pt called stating that pharmacy on file ([facility]) does not carry oxycodone. Pt requesting it be resent to [facility]. Pharmacy updated in [place] and Dr. Thermon Leyland notifed and stated he will resend as requested. Pt notified and verbalized understanding.

## 2021-01-19 NOTE — Progress Notes (Unsigned)
[place] VISIT   Primary Care Provider Elsie Stain, MD [address] [place] [address] [phone]  Referring Provider Dr. Carlean Purl  Patient Profile: Jordan [name] is a 65 y.o. male with a pmh significant for COPD, AAA, chronic HCV (viral load still present in 2021), family history of colon polyps, inguinal hernia (pending treatment next week), GERD, colon polyps, colon cancer (pedunculated polyp status post piecemeal resection with concern for inability to define margin).  The patient presents to the [facility] for an evaluation and management of problem(s) noted below:  Problem List 1. Adenocarcinoma of colon within a polyp ([place])   2. Adenomatous polyp of sigmoid colon   3. Abnormal colonoscopy     History of Present Illness This is a patient who underwent a diagnostic colonoscopy after positive Cologuard in December 2021 by Dr. Carlean Purl.  A large 40 mm polyp was found in the distal sigmoid colon that required piecemeal resection.  Final pathology showed evidence of adenocarcinoma arising in a tubular adenoma with high-grade dysplasia and prolapse into the stalk.  There was no lymphovascular invasion however as result of the adenocarcinoma being moderately differentiated in the margins because of the fragmentation of the specimen the possibility of an invasive adenocarcinoma at the deep margin of the lesion could not be excluded.  The patient then underwent 2 weeks later, a flexible sigmoidoscopy, with findings of an ulcer with biopsy showing evidence of atypia and inflammation but no evidence of cancer based on final cytostains.  The patient's case was discussed with the multidisciplinary conference by Dr. Carlean Purl.  Discussion as to consideration of surgical resection versus attempt at [facility] was had.  The patient was referred to [facility] surgery to have discussion with one of the colorectal surgeons.  With that  being said, the patient ended up seeing one of the general surgeons for issues of an inguinal hernia and is scheduled for hernia repair next week.  He is not excited about a colon resection being performed.  He has not seen one of the colorectal surgeons.  Patient denies any changes in his bowel habits.  No bleeding (melena/hematochezia/maroon stools) has been experienced.  The patient has been experiencing discomfort in his right inguinal region and is hopeful that the upcoming surgery will help with his symptoms.  GI Review of Systems Positive as above Negative for dysphagia, odynophagia, nausea, vomiting, change in bowel habits  Review of Systems General: Denies fevers/chills/weight loss unintentionally Cardiovascular: Denies chest pain/palpitations Pulmonary: Denies shortness of breath Gastroenterological: See HPI Genitourinary: Denies darkened urine Hematological: Denies easy bruising/bleeding Dermatological: Denies jaundice Psychological: Mood is stable   Medications Current Outpatient Medications  Medication Sig Dispense Refill  . Albuterol Sulfate (PROAIR RESPICLICK) 299 (90 Base) MCG/ACT AEPB Inhale 2 puffs into the lungs every 6 (six) hours as needed. 1 each 4  . amLODipine (NORVASC) 10 MG tablet Take 1 tablet (10 mg total) by mouth daily. 90 tablet 3  . sildenafil (VIAGRA) 100 MG tablet Take 1 tablet (100 mg total) by mouth daily as needed for erectile dysfunction. 5 tablet 1  . valsartan-hydrochlorothiazide (DIOVAN HCT) 160-25 MG tablet Take 1 tablet by mouth daily. 90 tablet 1  . ibuprofen (ADVIL) 800 MG tablet Take 1 tablet (800 mg total) by mouth every 8 (eight) hours for 7 days. 21 tablet 0  . oxyCODONE-acetaminophen (PERCOCET) 5-325 MG tablet Take 1 tablet by mouth every 4 (four) hours as needed for severe pain. 10 tablet 0  .  oxyCODONE-acetaminophen (PERCOCET) 5-325 MG tablet Take 1 tablet by mouth every 4 (four) hours as needed for severe pain. 10 tablet 0   No current  facility-administered medications for this visit.    Allergies No Known Allergies  Histories Past Medical History:  Diagnosis Date  . Allergy    seasonal  . Aneurysm of infrarenal abdominal aorta (HCC)    3.4 cm per angela mcclung pa 04-16-2020 office note  . Colon cancer ([place])   . COPD (chronic obstructive pulmonary disease) ([place])   . Family history of adverse reaction to anesthesia    mother slow to awaken  . GERD (gastroesophageal reflux disease)   . Hepatitis    hepatitis c dx in remission per pt  . Hypertension   . Inguinal hernia   . Multiple fractures 06/04/2020  . Positive colorectal cancer screening using Cologuard test 08/27/2020  . Substance abuse ([place])   . Wears glasses    for reading   Past Surgical History:  Procedure Laterality Date  . COLONOSCOPY  11/11/2020  . INGUINAL HERNIA REPAIR Right 01/19/2021   Procedure: LAPAROSCOPIC RIGHT INGUINAL HERNIA REPAIR WITH MESH;  Surgeon: Stechschulte, Nickola Major, MD;  Location: [facility];  Service: General;  Laterality: Right;  . obstruction removed as a child   swallowed a peanut   Social History   Socioeconomic History  . Marital status: Divorced    Spouse name: Not on file  . Number of children: Not on file  . Years of education: Not on file  . Highest education level: Not on file  Occupational History  . Not on file  Tobacco Use  . Smoking status: Former Smoker    Types: Cigarettes    Quit date: 03/14/2020    Years since quitting: 0.8  . Smokeless tobacco: Never Used  Vaping Use  . Vaping Use: Never used  Substance and Sexual Activity  . Alcohol use: Not Currently    Alcohol/week: 1.0 standard drink    Types: 1 Cans of beer per week    Comment: quit 2000  . Drug use: Yes    Types: Cocaine    Comment: last use cocaine 12-30-2020 per pt  . Sexual activity: Not on file  Other Topics Concern  . Not on file  Social History Narrative  . Not on file   Social Determinants of Health   Financial  Resource Strain: Not on file  Food Insecurity: Not on file  Transportation Needs: Not on file  Physical Activity: Not on file  Stress: Not on file  Social Connections: Not on file  Intimate Partner Violence: Not on file   Family History  Problem Relation Age of Onset  . Cancer Sister   . Colon polyps Sister   . Colon polyps Father   . Stomach cancer Father 56  . Colon polyps Brother   . Colon cancer Neg Hx   . Esophageal cancer Neg Hx   . Rectal cancer Neg Hx   . Inflammatory bowel disease Neg Hx   . Liver disease Neg Hx   . Pancreatic cancer Neg Hx    I have reviewed his medical, social, and family history in detail and updated the electronic medical record as necessary.    PHYSICAL EXAMINATION  BP 130/70   Pulse 85   Ht 6' (1.829 m)   Wt 165 lb (74.8 kg)   BMI 22.38 kg/m  Wt Readings from Last 3 Encounters:  01/19/21 161 lb 8 oz (73.3 kg)  01/18/21  165 lb (74.8 kg)  01/15/21 165 lb (74.8 kg)  GEN: NAD, appears stated age, doesn't appear chronically ill, accompanied by mother PSYCH: Cooperative, without pressured speech EYE: Conjunctivae pink, sclerae anicteric ENT: Masked CV: Nontachycardic RESP: No audible wheezing GI: NABS, soft, NT/ND, without rebound or guarding, no HSM appreciated MSK/EXT: No lower extremity edema SKIN: No jaundice NEURO:  Alert & Oriented x 3, no focal deficits   REVIEW OF DATA  I reviewed the following data at the time of this encounter:  GI Procedures and Studies  December 2021 colonoscopy - One 40 mm polyp in the distal sigmoid colon, removed piecemeal using a hot snare. Resected and retrieved. prophylactic endoloop placed on stalk. - Diverticulosis in the sigmoid colon. - The examination was otherwise normal on direct and retroflexion views.  Pathology Diagnosis 1. Surgical [P], colon, sigmoid, polyp (1) - ADENOCARCINOMA ARISING IN A TUBULAR ADENOMA WITH HIGH GRADE DYSPLASIA AND PROLAPSE INTO THE STALK. 2. Surgical [P],  colon, sigmoid, same polyp as jar 1, (1) - ADENOCARCINOMA ARISING IN A TUBULAR ADENOMA WITH HIGH GRADE DYSPLASIA. Microscopic Comment 1. and 2. The adenocarcinoma is moderately differentiated. There is no lymphovascular invasion. The lateral margins can not be accurately assessed due to fragmentation of the specimen. The deep cauterized margin in part 1 shows prolapsed adenoma with high grade dysplasia and extracellular mucin but no definitive evidence of invasive adenocarcinoma. The possibility of invasive adenocarcinoma at the deep margin of part 2 cannot be excluded. Clinical and endoscopic correlation is recommended to ensure complete removal.  January 2022 flexible sigmoidoscopy - Preparation of the colon was fair. - A single (solitary) ulcer in the sigmoid colon. Biopsied. Tattooed. - Diverticulosis in the sigmoid colon. - Stool in the rectum, in the sigmoid colon, in the descending colon and in the distal transverse Colon.  Pathology Diagnosis Surgical [P], colon, polypectomy site - COLONIC MUCOSA WITH NONSPECIFIC ARCHITECTURAL AND INFLAMMATORY CHANGES. SEE NOTE - NEGATIVE FOR CARCINOMA Diagnosis Note Findings are consistent with previous polypectomy site. Focal atypia was seen but immunohistochemical stains for pancytokeratin AE1/AE3 and CK8/18 do not show any evidence of carcinoma.  Laboratory Studies  Reviewed those in epic  Imaging Studies  No new imaging studies to review   ASSESSMENT  Mr. Kesling is a 65 y.o. male with a pmh significant for COPD, AAA, chronic HCV (viral load still present in 2021), family history of colon polyps, inguinal hernia (pending treatment next week), GERD, colon polyps, colon cancer (pedunculated polyp status post piecemeal resection with concern for inability to define margin).  The patient is seen today for evaluation and management of:  1. Adenocarcinoma of colon within a polyp ([place])   2. Adenomatous polyp of sigmoid colon   3. Abnormal  colonoscopy    The patient is hemodynamically and clinically stable.  He underwent recent colonoscopy with finding of a large pedunculated/semipedunculated polyp that required piecemeal resection.  There was concern for an invasive cancer although margin status could not be definitively materialized as a result of the piecemeal resection.  Patient's discussion at [facility] had been to consider potential surgical resection versus potential high risk surveillance and potential OVESCO FTRD to the region.  The biggest concern is not necessarily that he will have recurrence at the polyp site (he is already had one evaluation that showed just inflammatory changes) but rather that he could have or develop lymph node metastases at some point in the future.  The patient is very adamant that he does not want to have  any portion of his colon resected unless it was an emergency.  He is willing to move forward with his inguinal hernia repair because that is impacting his daily living.  He is planned for that next week.  With this being said, I did discuss the potential of a follow-up flexible sigmoidoscopy and potential OVESCO full-thickness resection.  This would give [place] a better sense of the actual resection site and ensure that we are not seeing anything that looks to be deeper or more invasive that would then absolutely require surgery.  Based upon the description and endoscopic pictures I do feel that it is reasonable to pursue an Advanced Polypectomy attempt of the polyp/lesion.  We discussed some of the techniques of advanced polypectomy which include Endoscopic Mucosal Resection, OVESCO Full-Thickness Resection, Endorotor Morcellation, and Tissue Ablation via Fulguration.  We also reviewed images of typical techniques as noted above.  The risks and benefits of endoscopic evaluation were discussed with the patient; these include but are not limited to the risk of perforation, infection, bleeding, missed lesions, lack of  diagnosis, severe illness requiring hospitalization, as well as anesthesia and sedation related illnesses.  During attempts at advanced resection, the risks of bleeding and perforation/leak are increased as opposed to diagnostic and screening procedures, and that was discussed with the patient as well.   In addition, I explained that with the possible need for piecemeal resection, subsequent short-interval endoscopic evaluation for follow up and potential retreatment of the lesion/area may be necessary.  I did offer, a referral to colorectal surgery in order for patient to have opportunity to discuss surgical management/intervention prior to finalizing decision for attempt at endoscopic removal, however, the patient deferred on this because he really wants to minimize a portion of his colon being removed.  If, after attempt at removal of the polyp/lesion, it is found that the patient has a complication or that an invasive lesion or malignant lesion is found, or that the polyp/lesion continues to recur, the patient is aware and understands that surgery may still be indicated/required.  All patient questions were answered, to the best of my ability, and the patient agrees to the aforementioned plan of action with follow-up as indicated.  We will plan to move forward with a flexible sigmoidoscopy at an interval of 4 to 6 weeks after his upcoming inguinal hernia repair.  I will update his primary gastroenterologist, Dr. Carlean Purl as well.   PLAN  Plan to proceed with flexible sigmoidoscopy with EMR/FTRD of the resection site of previous polyp with cancer Follow-up to be dictated by final pathology at time of evaluation We will likely consider cross-sectional imaging studies to be performed for monitoring/follow-up   Orders Placed This Encounter  Procedures  . Procedural/ Surgical Case Request: FLEXIBLE SIGMOIDOSCOPY, ENDOSCOPIC MUCOSAL RESECTION  . Ambulatory referral to Gastroenterology    Modified  Medications   No medications on file    Planned Follow Up No follow-ups on file.   Total Time in Face-to-Face and in Coordination of Care for patient including independent/personal interpretation/review of prior testing, medical history, examination, medication adjustment, communicating results with the patient directly, and documentation with the EHR is 35 minutes.   Justice Britain, MD [facility] Advanced Endoscopy Office # [phone]

## 2021-01-19 NOTE — Discharge Instructions (Signed)
Post Anesthesia Home Care Instructions  Activity: Get plenty of rest for the remainder of the day. A responsible adult should stay with you for 24 hours following the procedure.  For the next 24 hours, DO NOT: -Drive a car -Paediatric nurse -Drink alcoholic beverages -Take any medication unless instructed by your physician -Make any legal decisions or sign important papers.  Meals: Start with liquid foods such as gelatin or soup. Progress to regular foods as tolerated. Avoid greasy, spicy, heavy foods. If nausea and/or vomiting occur, drink only clear liquids until the nausea and/or vomiting subsides. Call your physician if vomiting continues.  Special Instructions/Symptoms: Your throat may feel dry or sore from the anesthesia or the breathing tube placed in your throat during surgery. If this causes discomfort, gargle with warm salt water. The discomfort should disappear within 24 hours.  If you had a scopolamine patch placed behind your ear for the management of post- operative nausea and/or vomiting:  1. The medication in the patch is effective for 72 hours, after which it should be removed.  Wrap patch in a tissue and discard in the trash. Wash hands thoroughly with soap and water. 2. You may remove the patch earlier than 72 hours if you experience unpleasant side effects which may include dry mouth, dizziness or visual disturbances. 3. Avoid touching the patch. Wash your hands with soap and water after contact with the patch.   Information for Discharge Teaching: EXPAREL (bupivacaine liposome injectable suspension)   Your surgeon gave you EXPAREL(bupivacaine) in your surgical incision to help control your pain after surgery.   EXPAREL is a local anesthetic that provides pain relief by numbing the tissue around the surgical site.  EXPAREL is designed to release pain medication over time and can control pain for up to 72 hours.  Depending on how you respond to EXPAREL, you may  require less pain medication during your recovery.  Possible side effects:  Temporary loss of sensation or ability to move in the area where bupivacaine was injected.  Nausea, vomiting, constipation  Rarely, numbness and tingling in your mouth or lips, lightheadedness, or anxiety may occur.  Call your doctor right away if you think you may be experiencing any of these sensations, or if you have other questions regarding possible side effects.  Follow all other discharge instructions given to you by your surgeon or nurse. Eat a healthy diet and drink plenty of water or other fluids.  If you return to the hospital for any reason within 96 hours following the administration of EXPAREL, please inform your health care providers.  GROIN HERNIA REPAIR POST OPERATIVE INSTRUCTIONS  Thinking Clearly  . The anesthesia may cause you to feel different for 1 or 2 days. Do not drive, drink alcohol, or make any big decisions for at least 2 days.  Nutrition . When you wake up, you will be able to drink small amounts of liquid. If you do not feel sick, you can slowly advance your diet to regular foods. . Continue to drink lots of fluids, usually about 8 to 10 glasses per day. . Eat a high-fiber diet so you don't strain during bowel movements. . High-Fiber Foods o Foods high in fiber include beans, bran cereals and whole-grain breads, peas, dried fruit (figs, apricots, and dates), raspberries, blackberries, strawberries, sweet corn, broccoli, baked potatoes with skin, plums, pears, apples, greens, and nuts. Activity . Slowly increase your activity. Be sure to get up and walk every hour or so to prevent blood  clots. . No heavy lifting or strenuous activity for 4 weeks following surgery to prevent hernias at your incision sites or recurrence of your hernia. . It is normal to feel tired. You may need more sleep than usual.  Get your rest but make sure to get up and move around frequently to prevent blood  clots and pneumonia.  Work and Return to [employer] . You can go back to work when you feel well enough. Discuss the timing with your surgeon. . You can usually go back to school or work 1 week or less after an laparoscopic or an open repair. . If your work requires heavy lifting or strenuous activity you need to be placed on light duty for 4 weeks following surgery. . You can return to gym class, sports or other physical activities 4 weeks after surgery.  Wound Care . You may experience significant bruising in the groin including into the scrotum in males.  Rest, elevating the groin and scrotum above the level of the heart, ice and compression with tight fitting underwear can help.  . Always wash your hands before and after touching near your incision site. . Do not soak in a bathtub until cleared at your follow up appointment. You may take a shower 24 hours after surgery. . A small amount of drainage from the incision is normal. If the drainage is thick and yellow or the site is red, you may have an infection, so call your surgeon. . If you have a drain in one of your incisions, it will be taken out in office when the drainage stops. . Steri-Strips will fall off in 7 to 10 days or they will be removed during your first office visit. . If you have dermabond glue covering over the incision, allow the glue to flake off on its own. . Protect the new skin, especially from the sun. The sun can burn and cause darker scarring. . Your scar will heal in about 4 to 6 weeks and will become softer and continue to fade over the next year.  The cosmetic appearance of the incisions will improve over the course of the first year after surgery. . Sensation around your incision will return in a few weeks or months.  Bowel Movements . After intestinal surgery, you may have loose watery stools for several days. If watery diarrhea lasts longer than 3 days, contact your surgeon. . Pain medication (narcotics) can cause  constipation. Increase the fiber in your diet with high-fiber foods if you are constipated. You can take an over the counter stool softener like Colace to avoid constipation.  Additional over the counter medications can also be used if Colace isn't sufficient (for example, Milk of Magnesia or Miralax).  Pain . The amount of pain is different for each person. Some people need only 1 to 3 doses of pain control medication, while others need more. . Take alternating doses of tylenol and ibuprofen around the clock for the first five days following surgery.  This will provide a baseline of pain control and help with inflammation.  Take the narcotic pain medication in addition if needed for severe pain.  Contact Your Surgeon at [phone], if you have: . Pain that will not go away . Pain that gets worse . A fever of more than 101F (38.3C) . Repeated vomiting . Swelling, redness, bleeding, or bad-smelling drainage from your wound site . Strong abdominal pain . No bowel movement or unable to pass gas for 3 days .  Watery diarrhea lasting longer than 3 days  Pain Control . The goal of pain control is to minimize pain, keep you moving and help you heal. Your surgical team will work with you on your pain plan. Most often a combination of therapies and medications are used to control your pain. You may also be given medication (local anesthetic) at the surgical site. This may help control your pain for several days. . Extreme pain puts extra stress on your body at a time when your body needs to focus on healing. Do not wait until your pain has reached a level "10" or is unbearable before telling your doctor or nurse. It is much easier to control pain before it becomes severe. . Following a laparoscopic procedure, pain is sometimes felt in the shoulder. This is due to the gas inserted into your abdomen during the procedure. Moving and walking helps to decrease the gas and the right shoulder pain.  . Use  the guide below for ways to manage your post-operative pain. Learn more by going to facs.org/safepaincontrol.  How Intense Is My Pain Common Therapies to Feel Better       I hardly notice my pain, and it does not interfere with my activities.  I notice my pain and it distracts me, but I can still do activities (sitting up, walking, standing).  Non-Medication Therapies  Ice (in a bag, applied over clothing at the surgical site), elevation, rest, meditation, massage, distraction (music, TV, play) walking and mild exercise Splinting the abdomen with pillows +  Non-Opioid Medications Acetaminophen (Tylenol) Non-steroidal anti-inflammatory drugs (NSAIDS) Aspirin, Ibuprofen (Motrin, Advil) Naproxen (Aleve) Take these as needed, when you feel pain. Both acetaminophen and NSAIDs help to decrease pain and swelling (inflammation).      My pain is hard to ignore and is more noticeable even when I rest.  My pain interferes with my usual activities.  Non-Medication Therapies  +  Non-Opioid medications  Take on a regular schedule (around-the-clock) instead of as needed. (For example, Tylenol every 6 hours at 9:00 am, 3:00 pm, 9:00 pm, 3:00 am and Motrin every 6 hours at 12:00 am, 6:00 am, 12:00 pm, 6:00 pm)         I am focused on my pain, and I am not doing my daily activities.  I am groaning in pain, and I cannot sleep. I am unable to do anything.  My pain is as bad as it could be, and nothing else matters.  Non-Medication Therapies  +  Around-the-Clock Non-Opioid Medications  +  Short-acting opioids  Opioids should be used with other medications to manage severe pain. Opioids block pain and give a feeling of euphoria (feel high). Addiction, a serious side effect of opioids, is rare with short-term (a few days) use.  Examples of short-acting opioids include: Tramadol (Ultram), Hydrocodone (Norco, Vicodin), Hydromorphone (Dilaudid), Oxycodone (Oxycontin)      The above directions have been adapted from the [employer] Surgical Patient Education Program.  Please refer to the ACS website if needed: PreferredVet.ca.ashx   Louanna Raw, MD [facility], [place], [place], [place], [place]  [address] ?  [address], [place], [place]   [address] [phone] ? [phone] ? FAX [phone] Web site: [email or web address]

## 2021-01-19 NOTE — Transfer of Care (Signed)
Immediate Anesthesia Transfer of Care Note  Patient: Jordan [name]  Procedure(s) Performed: LAPAROSCOPIC RIGHT INGUINAL HERNIA REPAIR WITH MESH (Right Inguinal)  Patient Location: PACU  Anesthesia Type:General  Level of Consciousness: awake, alert , oriented and patient cooperative  Airway & Oxygen Therapy: Patient Spontanous Breathing  Post-op Assessment: Report given to RN and Post -op Vital signs reviewed and stable  Post vital signs: Reviewed and stable  Last Vitals:  Vitals Value Taken Time  BP 191/96 01/19/21 1025  Temp    Pulse 80 01/19/21 1028  Resp 17 01/19/21 1028  SpO2 97 % 01/19/21 1028  Vitals shown include unvalidated device data.  Last Pain:  Vitals:   01/19/21 0800  TempSrc: Oral  PainSc: 0-No pain      Patients Stated Pain Goal: 4 (99/14/44 5848)  Complications: No complications documented.

## 2021-01-19 NOTE — H&P (Signed)
Admitting Physician: Nickola Major Zacariah Belue  Service: General Surgery  CC: right inguinal hernia  Subjective   HPI: Jordan [name] is an 65 y.o. male who is here for elective right inguinal hernia repair with mesh.  Past Medical History:  Diagnosis Date  . Allergy    seasonal  . Aneurysm of infrarenal abdominal aorta (HCC)    3.4 cm per angela mcclung pa 04-16-2020 office note  . Colon cancer ([place])   . COPD (chronic obstructive pulmonary disease) ([place])   . Family history of adverse reaction to anesthesia    mother slow to awaken  . GERD (gastroesophageal reflux disease)   . Hepatitis    hepatitis c dx in remission per pt  . Hypertension   . Inguinal hernia   . Multiple fractures 06/04/2020  . Positive colorectal cancer screening using Cologuard test 08/27/2020  . Substance abuse ([place])   . Wears glasses    for reading    Past Surgical History:  Procedure Laterality Date  . COLONOSCOPY  11/11/2020  . obstruction removed as a child   swallowed a peanut    Family History  Problem Relation Age of Onset  . Cancer Sister   . Colon polyps Sister   . Colon polyps Father   . Stomach cancer Father 34  . Colon polyps Brother   . Colon cancer Neg Hx   . Esophageal cancer Neg Hx   . Rectal cancer Neg Hx     Social:  reports that he quit smoking about 10 months ago. His smoking use included cigarettes. He has never used smokeless tobacco. He reports previous alcohol use of about 1.0 standard drink of alcohol per week. He reports current drug use. Drug: Cocaine.  Allergies: No Known Allergies  Medications: Current Outpatient Medications  Medication Instructions  . Albuterol Sulfate (PROAIR RESPICLICK) 878 (90 Base) MCG/ACT AEPB 2 puffs, Inhalation, Every 6 hours PRN  . amLODipine (NORVASC) 10 mg, Oral, Daily  . sildenafil (VIAGRA) 100 mg, Oral, Daily PRN  . valsartan-hydrochlorothiazide (DIOVAN HCT) 160-25 MG tablet 1 tablet, Oral, Daily    ROS - all of the below  systems have been reviewed with the patient and positives are indicated with bold text General: chills, fever or night sweats Eyes: blurry vision or double vision ENT: epistaxis or sore throat Allergy/Immunology: itchy/watery eyes or nasal congestion Hematologic/Lymphatic: bleeding problems, blood clots or swollen lymph nodes Endocrine: temperature intolerance or unexpected weight changes Breast: new or changing breast lumps or nipple discharge Resp: cough, shortness of breath, or wheezing CV: chest pain or dyspnea on exertion GI: as per HPI GU: dysuria, trouble voiding, or hematuria MSK: joint pain or joint stiffness Neuro: TIA or stroke symptoms Derm: pruritus and skin lesion changes Psych: anxiety and depression  Objective   PE Blood pressure (!) 153/87, pulse 79, temperature 97.6 F (36.4 C), temperature source Oral, resp. rate 17, height 6' (1.829 m), weight 73.3 kg, SpO2 99 %. Constitutional: NAD; conversant; no deformities Eyes: Moist conjunctiva; no lid lag; anicteric; PERRL Neck: Trachea midline; no thyromegaly Lungs: Normal respiratory effort; no tactile fremitus CV: RRR; no palpable thrills; no pitting edema GI: Abd Right inguinal hernia; no palpable hepatosplenomegaly MSK: Normal range of motion of extremities; no clubbing/cyanosis Psychiatric: Appropriate affect; alert and oriented x3 Lymphatic: No palpable cervical or axillary lymphadenopathy  Results for orders placed or performed during the hospital encounter of 01/18/21 (from the past 24 hour(s))  SARS CORONAVIRUS 2 (TAT 6-24 HRS) Nasopharyngeal Nasopharyngeal Swab  Status: None   Collection Time: 01/18/21 10:17 AM   Specimen: Nasopharyngeal Swab  Result Value Ref Range   SARS Coronavirus 2 NEGATIVE NEGATIVE    Imaging Orders  No imaging studies ordered today     Assessment and Plan   Jordan [name] is an 65 y.o. male with right inguinal hernia, here for elective repair.  The procedure itself as  well as its risks, benefits, and alternatives were discussed and the patient granted consent to proceed.  We will proceed as scheduled.   Felicie Morn, MD  [facility] Use [email or web address] to contact on call provider

## 2021-01-19 NOTE — Anesthesia Preprocedure Evaluation (Signed)
Anesthesia Evaluation  Patient identified by MRN, date of birth, ID band Patient awake    Reviewed: Allergy & Precautions, NPO status , Patient's Chart, lab work & pertinent test results  History of Anesthesia Complications (+) Family history of anesthesia reaction  Airway Mallampati: II  TM Distance: >3 FB Neck ROM: Full    Dental no notable dental hx. (+) Dental Advisory Given, Missing, Chipped, Poor Dentition   Pulmonary COPD, former smoker,     + decreased breath sounds  (-) rales    Cardiovascular hypertension, Normal cardiovascular exam Rhythm:Regular Rate:Normal     Neuro/Psych negative neurological ROS     GI/Hepatic GERD  ,(+) Hepatitis -  Endo/Other  negative endocrine ROS  Renal/GU negative Renal ROS     Musculoskeletal negative musculoskeletal ROS (+)   Abdominal   Peds  Hematology negative hematology ROS (+)   Anesthesia Other Findings   Reproductive/Obstetrics                             Anesthesia Physical Anesthesia Plan  ASA: III  Anesthesia Plan: General   Post-op Pain Management:    Induction: Intravenous  PONV Risk Score and Plan: 4 or greater and Ondansetron, Dexamethasone, Midazolam and Treatment may vary due to age or medical condition  Airway Management Planned: Oral ETT  Additional Equipment: None  Intra-op Plan:   Post-operative Plan: Extubation in OR  Informed Consent: I have reviewed the patients History and Physical, chart, labs and discussed the procedure including the risks, benefits and alternatives for the proposed anesthesia with the patient or authorized representative who has indicated his/her understanding and acceptance.     Dental advisory given  Plan Discussed with: CRNA  Anesthesia Plan Comments:         Anesthesia Quick Evaluation

## 2021-01-20 ENCOUNTER — Encounter (HOSPITAL_BASED_OUTPATIENT_CLINIC_OR_DEPARTMENT_OTHER): Payer: Self-pay | Admitting: Surgery

## 2021-03-03 ENCOUNTER — Telehealth: Payer: Self-pay | Admitting: Gastroenterology

## 2021-03-03 NOTE — Telephone Encounter (Signed)
Patients appt has been r/s to 03/08/21 @ 12:00 noon. Pt has been informed.

## 2021-03-03 NOTE — Telephone Encounter (Signed)
Pt wants to know if he can change his appt for Covid test to 03/08/21. Pls call him

## 2021-03-03 NOTE — Telephone Encounter (Signed)
Pt was scheduled in office please send to [place] thanks

## 2021-03-04 NOTE — Progress Notes (Signed)
Attempted to obtain medical history via telephone, unable to reach at this time. I left a voicemail to return pre surgical testing department's phone call.  

## 2021-03-06 ENCOUNTER — Other Ambulatory Visit (HOSPITAL_COMMUNITY): Payer: Medicare Other

## 2021-03-08 ENCOUNTER — Telehealth: Payer: Self-pay

## 2021-03-08 ENCOUNTER — Other Ambulatory Visit: Payer: Self-pay

## 2021-03-08 ENCOUNTER — Other Ambulatory Visit (HOSPITAL_COMMUNITY)
Admission: RE | Admit: 2021-03-08 | Discharge: 2021-03-08 | Disposition: A | Discharge status: Home / Self Care | Payer: Medicare Other | Source: Ambulatory Visit | Attending: Gastroenterology | Admitting: Gastroenterology

## 2021-03-08 DIAGNOSIS — C189 Malignant neoplasm of colon, unspecified: Secondary | ICD-10-CM

## 2021-03-08 DIAGNOSIS — Z01812 Encounter for preprocedural laboratory examination: Secondary | ICD-10-CM | POA: Diagnosis present

## 2021-03-08 DIAGNOSIS — Z20822 Contact with and (suspected) exposure to covid-19: Secondary | ICD-10-CM | POA: Diagnosis not present

## 2021-03-08 NOTE — Telephone Encounter (Signed)
-----   Message from Irving Copas., MD sent at 03/08/2021 11:09 AM EDT ----- Regarding: RE: Upcoming procedure Thanks so much for the help as always. GM ----- Message ----- From: Timothy Lasso, RN Sent: 03/08/2021  10:43 AM EDT To: Irving Copas., MD Subject: Melton Alar: Upcoming procedure                          ----- Message ----- From: Darden Dates Sent: 03/08/2021  10:36 AM EDT To: Timothy Lasso, RN Subject: RE: Upcoming procedure                         Auth# [id number] good 03/10/21-06/08/21 ----- Message ----- From: Timothy Lasso, RN Sent: 03/08/2021   9:22 AM EDT To: Darden Dates Subject: RE: Upcoming procedure                         Done thank you  ----- Message ----- From: Darden Dates Sent: 03/08/2021   9:20 AM EDT To: Timothy Lasso, RN Subject: RE: Upcoming procedure                         Please put in amb referral.  ----- Message ----- From: Timothy Lasso, RN Sent: 03/08/2021   8:36 AM EDT To: [place] Subject: RE: Upcoming procedure                         He doesn't want the case added yet.  He would like the precert just in case. ----- Message ----- From: Darden Dates Sent: 03/08/2021   8:29 AM EDT To: Timothy Lasso, RN Subject: RE: Upcoming procedure                         Authorization is needed with his insurance.  You will need to call to add to the case.  Let me know after it's booked. Thanks, Amy ----- Message ----- From: Timothy Lasso, RN Sent: 03/08/2021   8:19 AM EDT To: Darden Dates Subject: FW: Upcoming procedure                          ----- Message ----- From: Irving Copas., MD Sent: 03/05/2021   5:25 PM EDT To: Timothy Lasso, RN, Debbe Mounts, CMA Subject: Upcoming procedure                             Ryah Cribb and [place], As I have thought about this patient's case, there is a chance I could need endoscopic ultrasound.  I just want a make sure that we have the ability to do  that and he does not get extra cost so I would work on trying to ensure that we get precertification for the possibility of endoscopic ultrasound.  Diagnosis is colon cancer from prior polyp resection. His procedure is upcoming for 4/27 so would be ideal to have that ability if needed.   Thanks.   GM

## 2021-03-08 NOTE — Telephone Encounter (Signed)
See below the pt has prior auth if procedure is needed.

## 2021-03-09 LAB — SARS CORONAVIRUS 2 (TAT 6-24 HRS): SARS Coronavirus 2: NEGATIVE

## 2021-03-10 ENCOUNTER — Encounter (HOSPITAL_COMMUNITY)
Admission: RE | Disposition: A | Discharge status: Home / Self Care | Payer: Self-pay | Source: Home / Self Care | Attending: Gastroenterology

## 2021-03-10 ENCOUNTER — Encounter (HOSPITAL_COMMUNITY): Payer: Self-pay | Admitting: Gastroenterology

## 2021-03-10 ENCOUNTER — Ambulatory Visit (HOSPITAL_COMMUNITY): Payer: Medicare Other | Admitting: Registered Nurse

## 2021-03-10 ENCOUNTER — Ambulatory Visit (HOSPITAL_COMMUNITY)
Admission: RE | Admit: 2021-03-10 | Discharge: 2021-03-10 | Disposition: A | Discharge status: Home / Self Care | Payer: Medicare Other | Attending: Gastroenterology | Admitting: Gastroenterology

## 2021-03-10 ENCOUNTER — Other Ambulatory Visit: Payer: Self-pay

## 2021-03-10 DIAGNOSIS — Z800 Family history of malignant neoplasm of digestive organs: Secondary | ICD-10-CM | POA: Diagnosis not present

## 2021-03-10 DIAGNOSIS — Z87891 Personal history of nicotine dependence: Secondary | ICD-10-CM | POA: Insufficient documentation

## 2021-03-10 DIAGNOSIS — K621 Rectal polyp: Secondary | ICD-10-CM | POA: Insufficient documentation

## 2021-03-10 DIAGNOSIS — Z85038 Personal history of other malignant neoplasm of large intestine: Secondary | ICD-10-CM | POA: Diagnosis not present

## 2021-03-10 DIAGNOSIS — K5730 Diverticulosis of large intestine without perforation or abscess without bleeding: Secondary | ICD-10-CM | POA: Diagnosis not present

## 2021-03-10 DIAGNOSIS — D123 Benign neoplasm of transverse colon: Secondary | ICD-10-CM | POA: Insufficient documentation

## 2021-03-10 DIAGNOSIS — Z08 Encounter for follow-up examination after completed treatment for malignant neoplasm: Secondary | ICD-10-CM | POA: Diagnosis present

## 2021-03-10 DIAGNOSIS — D490 Neoplasm of unspecified behavior of digestive system: Secondary | ICD-10-CM

## 2021-03-10 DIAGNOSIS — K644 Residual hemorrhoidal skin tags: Secondary | ICD-10-CM | POA: Insufficient documentation

## 2021-03-10 DIAGNOSIS — K635 Polyp of colon: Secondary | ICD-10-CM

## 2021-03-10 DIAGNOSIS — K641 Second degree hemorrhoids: Secondary | ICD-10-CM | POA: Insufficient documentation

## 2021-03-10 HISTORY — PX: FLEXIBLE SIGMOIDOSCOPY: SHX5431

## 2021-03-10 HISTORY — PX: HEMOSTASIS CLIP PLACEMENT: SHX6857

## 2021-03-10 HISTORY — PX: POLYPECTOMY: SHX5525

## 2021-03-10 HISTORY — PX: SUBMUCOSAL LIFTING INJECTION: SHX6855

## 2021-03-10 HISTORY — PX: ENDOSCOPIC MUCOSAL RESECTION: SHX6839

## 2021-03-10 SURGERY — SIGMOIDOSCOPY, FLEXIBLE
Anesthesia: Monitor Anesthesia Care

## 2021-03-10 MED ORDER — PHENYLEPHRINE 40 MCG/ML (10ML) SYRINGE FOR IV PUSH (FOR BLOOD PRESSURE SUPPORT)
PREFILLED_SYRINGE | INTRAVENOUS | Status: DC | PRN
  Administered 2021-03-10 (×2): 80 ug via INTRAVENOUS

## 2021-03-10 MED ORDER — PROPOFOL 500 MG/50ML IV EMUL
INTRAVENOUS | Status: DC | PRN
  Administered 2021-03-10: 125 ug/kg/min via INTRAVENOUS

## 2021-03-10 MED ORDER — PROPOFOL 500 MG/50ML IV EMUL
INTRAVENOUS | Status: AC
  Filled 2021-03-10: qty 50

## 2021-03-10 MED ORDER — LACTATED RINGERS IV SOLN: INTRAVENOUS | Status: DC

## 2021-03-10 MED ORDER — LIDOCAINE 2% (20 MG/ML) 5 ML SYRINGE
INTRAMUSCULAR | Status: DC | PRN
  Administered 2021-03-10: 80 mg via INTRAVENOUS

## 2021-03-10 NOTE — Anesthesia Postprocedure Evaluation (Signed)
Anesthesia Post Note  Patient: Jordan [name]  Procedure(s) Performed: FLEXIBLE SIGMOIDOSCOPY (N/A ) ENDOSCOPIC MUCOSAL RESECTION (N/A ) POLYPECTOMY HEMOSTASIS CLIP PLACEMENT SUBMUCOSAL LIFTING INJECTION     Patient location during evaluation: Endoscopy Anesthesia Type: MAC Level of consciousness: awake and alert Pain management: pain level controlled Vital Signs Assessment: post-procedure vital signs reviewed and stable Respiratory status: spontaneous breathing, nonlabored ventilation, respiratory function stable and patient connected to nasal cannula oxygen Cardiovascular status: stable and blood pressure returned to baseline Postop Assessment: no apparent nausea or vomiting Anesthetic complications: no   No complications documented.  Last Vitals:  Vitals:   03/10/21 1210 03/10/21 1222  BP: 131/79 (!) 149/80  Pulse: 60 (!) 53  Resp: 20 17  Temp: (!) 36 C   SpO2: 91% 93%    Last Pain:  Vitals:   03/10/21 1210  TempSrc: Temporal  PainSc: 0-No pain                 Belenda Cruise P Miguelangel Korn

## 2021-03-10 NOTE — Anesthesia Procedure Notes (Signed)
Date/Time: 03/10/2021 9:36 AM Performed by: Talbot Grumbling, CRNA Oxygen Delivery Method: Simple face mask

## 2021-03-10 NOTE — Op Note (Signed)
[facility] Patient Name: Jordan [name] Procedure Date: 03/10/2021 MRN: [id number] Attending MD: Justice Britain , MD Date of Birth: Jan 21, 1956 CSN: [id number] Age: 65 Admit Type: Outpatient Procedure:                Flexible Sigmoidoscopy Indications:              Follow-up endoscopy after surgery Providers:                Justice Britain, MD, Kary Kos RN, RN, Ladona Ridgel, Technician, Marla Roe, CRNA Referring MD:             Gatha Mayer, MD, Sharon Mt. White MD, [place] Medicines:                Monitored Anesthesia Care Complications:            No immediate complications. Estimated Blood Loss:     Estimated blood loss was minimal. Procedure:                Pre-Anesthesia Assessment:                           - Prior to the procedure, a History and Physical                            was performed, and patient medications and                            allergies were reviewed. The patient's tolerance of                            previous anesthesia was also reviewed. The risks                            and benefits of the procedure and the sedation                            options and risks were discussed with the patient.                            All questions were answered, and informed consent                            was obtained. Prior Anticoagulants: The patient has                            taken no previous anticoagulant or antiplatelet                            agents except for NSAID medication. ASA Grade  Assessment: III - A patient with severe systemic                            disease. After reviewing the risks and benefits,                            the patient was deemed in satisfactory condition to                            undergo the procedure.                           After obtaining informed consent, the scope was                             passed under direct vision. The [id number]-[id number]                            ([id number] Olympus therapeutic endoscope was                            introduced through the anus and advanced to the the                            left transverse colon. The CF-[id number] ([id number])                            Olympus colonoscope was introduced through the anus                            and advanced to the the descending colon. The                            flexible sigmoidoscopy was technically difficult                            and complex due to an endoscope malfunction.                            Successful completion of the procedure was aided by                            performing the maneuvers documented (below) in this                            report. The patient tolerated the procedure. The                            quality of the bowel preparation was good. Scope In: 9:51:32 AM Scope Out: 11:15:46 AM Total Procedure Duration: 1 hour 24 minutes 14 seconds  Findings:      The digital rectal exam findings include hemorrhoids. Pertinent       negatives include no palpable rectal lesions.      Four sessile polyps were  found in the rectum (1), splenic flexure (1)       and transverse colon (2). The polyps were 3 to 10 mm in size. These       polyps were removed with a cold snare. Resection and retrieval were       complete.      Multiple small-mouthed diverticula were found in the recto-sigmoid colon       and sigmoid colon.      A 15 mm polypoid lesion was found in the recto-sigmoid colon just       proximal to the tattoo in the region. The lesion was polypoid. No       bleeding was present. This is the site of previous resection with       concern that mucosal changes could still show evidence of adenomatous       tissue. FTRD was to be attempted. PROVE-IT Cap was placed on the adult       colonoscope and was able to make it to this region with water        insufllation. However, when the actual device to mark the region with       cautery was placed, we were not able to have any evidence of active       electocautery with the device. Multiple different ERBE units were used.       We could not ascertain that adequate cautery could be used. This then       led [place] to proceed with a different means of attempt of resection since I       did not want to place or use the device if the electrocautery settings       could not be used appropriately. Decision made for preparations were       made for mucosal resection using attempt at Cap-band ligation. NBI       imaging and White-light endoscopy was done to demarcate the borders of       the lesion. Orise gel was injected to raise the lesion. Band ligator x 2       was placed on the region and snare mucosal resection was performed.       Resection and retrieval were complete. The deeper band was still on the       very bottom of the tissue. No evidence of perforation was noted. To       prevent bleeding after mucosal resection, three hemostatic clips were       successfully placed (MR conditional). There was no bleeding during, or       at the end, of the procedure.      Normal mucosa was found in the entire visualized colon otherwise.      Non-bleeding non-thrombosed external and internal hemorrhoids were found       during retroflexion, during perianal exam and during digital exam. The       hemorrhoids were Grade II (internal hemorrhoids that prolapse but reduce       spontaneously). Impression:               - Hemorrhoids found on digital rectal exam.                           - Four, 3 to 10 mm polyps in the rectum, at the  splenic flexure and in the transverse colon,                            removed with a cold snare. Resected and retrieved.                           - Diverticulosis in the recto-sigmoid colon and in                            the sigmoid colon.                            - Rule out recurrent adenoma or persistent                            malignancy, polypoid lesion in the recto-sigmoid                            colon. FTRD was to be attempted but with issues                            above we proceed with a deeper Cap-Band assisted                            mucosal resection and this was removed                            successfully. Clips (MR conditional) were placed.                           - Normal mucosa in the entire examined colon                            visualized otherwise.                           - Non-bleeding non-thrombosed external and internal                            hemorrhoids. Moderate Sedation:      Not Applicable - Patient had care per Anesthesia. Recommendation:           - The patient will be observed post-procedure,                            until all discharge criteria are met.                           - Discharge patient to home.                           - Patient has a contact number available for                            emergencies. The signs and symptoms of potential  delayed complications were discussed with the                            patient. Return to normal activities tomorrow.                            Written discharge instructions were provided to the                            patient.                           - High fiber diet.                           - Monitor for signs/symptoms of bleeding,                            perforation, and infection. If issues please call                            our number to get further assistance as needed.                           - Await pathology results.                           - If there is evidence of adenomatous tissue or                            malignancy, we can reperform an attempt at [facility]                            after ensuring our devices are in adequate use - if                             the patient remains adament to not undergo                            resection of the colon.                           - No ibuprofen, naproxen, or other non-steroidal                            anti-inflammatory drugs for 2 weeks after polyp                            removal.                           - The findings and recommendations were discussed                            with the patient.                           -  The findings and recommendations were discussed                            with the patient's family. Procedure Code(s):        --- Professional ---                           [phone], Sigmoidoscopy, flexible; with endoscopic                            mucosal resection                           45338, 68, Sigmoidoscopy, flexible; with removal of                            tumor(s), polyp(s), or other lesion(s) by snare                            technique Diagnosis Code(s):        --- Professional ---                           K64.1, Second degree hemorrhoids                           K62.1, Rectal polyp                           K63.5, Polyp of colon                           D49.0, Neoplasm of unspecified behavior of                            digestive system                           Z09, Encounter for follow-up examination after                            completed treatment for conditions other than                            malignant neoplasm                           K57.30, Diverticulosis of large intestine without                            perforation or abscess without bleeding CPT copyright 2019 American Medical Association. All rights reserved. The codes documented in this report are preliminary and upon coder review may  be revised to meet current compliance requirements. Justice Britain, MD 03/10/2021 11:45:08 AM Number of Addenda: 0

## 2021-03-10 NOTE — Anesthesia Preprocedure Evaluation (Signed)
Anesthesia Evaluation  Patient identified by MRN, date of birth, ID band Patient awake    Reviewed: Allergy & Precautions, NPO status , Patient's Chart, lab work & pertinent test results  Airway Mallampati: II  TM Distance: >3 FB Neck ROM: Full    Dental  (+) Teeth Intact   Pulmonary COPD,  COPD inhaler, former smoker,    Pulmonary exam normal        Cardiovascular hypertension, Pt. on medications  Rhythm:Regular Rate:Normal     Neuro/Psych negative neurological ROS  negative psych ROS   GI/Hepatic Neg liver ROS, GERD  ,Sigmoid polyps, sigmoid cancer   Endo/Other  negative endocrine ROS  Renal/GU negative Renal ROS  negative genitourinary   Musculoskeletal negative musculoskeletal ROS (+)   Abdominal (+)  Abdomen: soft. Bowel sounds: normal.  Peds  Hematology negative hematology ROS (+)   Anesthesia Other Findings   Reproductive/Obstetrics                             Anesthesia Physical Anesthesia Plan  ASA: II  Anesthesia Plan: MAC   Post-op Pain Management:    Induction: Intravenous  PONV Risk Score and Plan: 1 and Propofol infusion  Airway Management Planned: Simple Face Mask, Natural Airway and Nasal Cannula  Additional Equipment: None  Intra-op Plan:   Post-operative Plan:   Informed Consent: I have reviewed the patients History and Physical, chart, labs and discussed the procedure including the risks, benefits and alternatives for the proposed anesthesia with the patient or authorized representative who has indicated his/her understanding and acceptance.     Dental advisory given  Plan Discussed with: CRNA  Anesthesia Plan Comments: (Lab Results      Component                Value               Date                      WBC                      4.4                 01/18/2021                HGB                      16.6                01/18/2021                 HCT                      49.6                01/18/2021                MCV                      90.0                01/18/2021                PLT                      233  01/18/2021           Lab Results      Component                Value               Date                      NA                       137                 01/18/2021                K                        4.1                 01/18/2021                CO2                      26                  01/18/2021                GLUCOSE                  100 (H)             01/18/2021                BUN                      36 (H)              01/18/2021                CREATININE               1.54 (H)            01/18/2021                CALCIUM                  8.9                 01/18/2021                GFRNONAA                 50 (L)              01/18/2021                GFRAA                    >60                 04/06/2020          )        Anesthesia Quick Evaluation

## 2021-03-10 NOTE — Transfer of Care (Signed)
Immediate Anesthesia Transfer of Care Note  Patient: Jordan [name]  Procedure(s) Performed: FLEXIBLE SIGMOIDOSCOPY (N/A ) ENDOSCOPIC MUCOSAL RESECTION (N/A ) POLYPECTOMY HEMOSTASIS CLIP PLACEMENT SUBMUCOSAL LIFTING INJECTION  Patient Location: PACU  Anesthesia Type:MAC  Level of Consciousness: sedated  Airway & Oxygen Therapy: Patient Spontanous Breathing and Patient connected to face mask oxygen  Post-op Assessment: Report given to RN and Post -op Vital signs reviewed and stable  Post vital signs: Reviewed and stable  Last Vitals:  Vitals Value Taken Time  BP    Temp    Pulse 62 03/10/21 1127  Resp 21 03/10/21 1127  SpO2 91 % 03/10/21 1127  Vitals shown include unvalidated device data.  Last Pain:  Vitals:   03/10/21 0821  TempSrc: Oral  PainSc: 0-No pain         Complications: No complications documented.

## 2021-03-10 NOTE — Discharge Instructions (Signed)

## 2021-03-10 NOTE — H&P (Signed)
GASTROENTEROLOGY PROCEDURE H&P NOTE   Primary Care Physician: Elsie Stain, MD  HPI: Jordan [name] is a 65 y.o. 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  For possible deeper EMR vs FTRD vs surveillance of region.  Past Medical History:  Diagnosis Date  . Allergy    seasonal  . Aneurysm of infrarenal abdominal aorta (HCC)    3.4 cm per angela mcclung pa 04-16-2020 office note  . Colon cancer ([place])   . COPD (chronic obstructive pulmonary disease) ([place])   . Family history of adverse reaction to anesthesia    mother slow to awaken  . GERD (gastroesophageal reflux disease)   . Hepatitis    hepatitis c dx in remission per pt  . Hypertension   . Inguinal hernia   . Multiple fractures 06/04/2020  . Positive colorectal cancer screening using Cologuard test 08/27/2020  . Substance abuse ([place])   . Wears glasses    for reading   Past Surgical History:  Procedure Laterality Date  . COLONOSCOPY  11/11/2020  . INGUINAL HERNIA REPAIR Right 01/19/2021   Procedure: LAPAROSCOPIC RIGHT INGUINAL HERNIA REPAIR WITH MESH;  Surgeon: Stechschulte, Nickola Major, MD;  Location: [place];  Service: General;  Laterality: Right;  . obstruction removed as a child   swallowed a peanut   Current Facility-Administered Medications  Medication Dose Route Frequency Provider Last Rate Last Admin  . lactated ringers infusion   Intravenous Continuous Mansouraty, Telford Nab., MD 10 mL/hr at 03/10/21 0825 New Bag at 03/10/21 0825   No Known Allergies Family History  Problem Relation Age of Onset  . Cancer Sister   . Colon polyps Sister   . Colon polyps Father   . Stomach cancer Father 67  . Colon polyps Brother   . Colon cancer Neg Hx   . Esophageal cancer Neg Hx   . Rectal cancer Neg Hx   . Inflammatory bowel disease Neg Hx   . Liver disease Neg Hx   . Pancreatic cancer Neg Hx    Social History    Socioeconomic History  . Marital status: Divorced    Spouse name: Not on file  . Number of children: Not on file  . Years of education: Not on file  . Highest education level: Not on file  Occupational History  . Not on file  Tobacco Use  . Smoking status: Former Smoker    Types: Cigarettes    Quit date: 03/14/2020    Years since quitting: 0.9  . Smokeless tobacco: Never Used  Vaping Use  . Vaping Use: Never used  Substance and Sexual Activity  . Alcohol use: Not Currently    Alcohol/week: 1.0 standard drink    Types: 1 Cans of beer per week    Comment: quit 2000  . Drug use: Yes    Types: Cocaine    Comment: last use cocaine 12-30-2020 per pt  . Sexual activity: Not on file  Other Topics Concern  . Not on file  Social History Narrative  . Not on file   Social Determinants of Health   Financial Resource Strain: Not on file  Food Insecurity: Not on file  Transportation Needs: Not on file  Physical Activity: Not on file  Stress: Not on file  Social Connections: Not on file  Intimate Partner Violence: Not on file    Physical Exam: Vital signs in last 24 hours: Temp:  [97.7 F (36.5  C)] 97.7 F (36.5 C) (04/27 0821) Resp:  [18] 18 (04/27 0821) BP: (163)/(93) 163/93 (04/27 0821) SpO2:  [100 %] 100 % (04/27 0821) Weight:  [73.9 kg] 73.9 kg (04/27 0821)   GEN: NAD EYE: Sclerae anicteric ENT: MMM CV: Non-tachycardic GI: Soft, NT/ND NEURO:  Alert & Oriented x 3  Lab Results: No results for input(s): WBC, HGB, HCT, PLT in the last 72 hours. BMET No results for input(s): NA, K, CL, CO2, GLUCOSE, BUN, CREATININE, CALCIUM in the last 72 hours. LFT No results for input(s): PROT, ALBUMIN, AST, ALT, ALKPHOS, BILITOT, BILIDIR, IBILI in the last 72 hours. PT/INR No results for input(s): LABPROT, INR in the last 72 hours.   Impression / Plan: This is a 65 y.o.male who presents for Flexible sigmoidoscopy to evaluate the previous resection site of polyp that had  adenocarcinoma.  For possible deeper EMR vs FTRD vs surveillance of region.  The risks and benefits of endoscopic evaluation were discussed with the patient; these include but are not limited to the risk of perforation, infection, bleeding, missed lesions, lack of diagnosis, severe illness requiring hospitalization, as well as anesthesia and sedation related illnesses.  The patient is agreeable to proceed.    Justice Britain, MD [facility] Advanced Endoscopy Office # [phone]

## 2021-03-11 ENCOUNTER — Encounter (HOSPITAL_COMMUNITY): Payer: Self-pay | Admitting: Gastroenterology

## 2021-03-11 LAB — SURGICAL PATHOLOGY

## 2021-03-19 ENCOUNTER — Encounter: Payer: Self-pay | Admitting: Gastroenterology

## 2021-03-31 ENCOUNTER — Telehealth: Payer: Self-pay

## 2021-03-31 ENCOUNTER — Other Ambulatory Visit: Payer: Self-pay

## 2021-03-31 NOTE — Telephone Encounter (Signed)
-----   Message from Irving Copas., MD sent at 03/31/2021 11:16 AM EDT ----- Regarding: Referral Talen Poser, I have placed an [place] discussion note from today. Patient and I spoke as well this morning. Please proceed with with colorectal surgery referral (Dr. Jaynee Eagles. Thomas/Dr. Dema Severin) for consideration of surgical resection versus high risk endoscopic surveillance. Patient leaning towards high risk endoscopic surveillance but willing to hear about surgical options. FYI Dr. Carlean Purl and Dr. Joya Gaskins. Thanks. GM

## 2021-03-31 NOTE — Progress Notes (Signed)
The proposed treatment discussed in conference is for discussion purposes only and is not a binding recommendation.  The patients have not been physically examined, or presented with their treatment options.  Therefore, final treatment plans cannot be decided.   

## 2021-03-31 NOTE — Progress Notes (Signed)
Case discussed at [facility] this morning. We reviewed the patient's initial pathology and the most recent pathology. Patient's most recent pathology shows no evidence of precancerous tissue on the EMR resection site of the either residual or regrowth of tissue at the site of previous colon polyp with cancer resection. After discussion with the colorectal surgeons and oncologist, it was felt that due to the "invasiveness" of the cancer noted on the initial resection (even though it was felt that it was complete though had to be done in piecemeal) that consideration of surgical resection and lymph node resection to ensure that recurrence does not happen outside of the colon region itself should still be considered in some individuals. If we believe that the colon polyp was removed completely, then if we extrapolate a T1 colon cancer without lymph nodes (even though lymph nodes were not removed) then the risk of recurrence may be low such that additional therapies would not be offered other than close monitoring and surveillance. The colorectal surgeons were happy to meet the patient so that he would have an opportunity to decide how he wanted to approach things further. I called and spoke with the patient this morning and discussed the recommendations. The patient still remains hesitant to undergo surgery that could lead to ostomy creation but is willing to meet with one of the surgeons. I will refer him to Dr. Jaynee Eagles. Thomas/Dr. Dema Severin to have an opportunity to meet the patient formally and finalize a plan for either high risk endoscopic surveillance versus consideration of colon resection and lymph node resection. Patient appreciative for all of the feedback and care he has received up to this point. He is leaning towards just wanting to have colonoscopies but is wanting to hear and have all the information before he makes a final decision.

## 2021-03-31 NOTE — Progress Notes (Signed)
Referral made to [facility].  

## 2021-03-31 NOTE — Telephone Encounter (Signed)
The referral has been made records faxed

## 2021-04-02 ENCOUNTER — Telehealth: Payer: Self-pay | Admitting: Gastroenterology

## 2021-04-02 NOTE — Telephone Encounter (Signed)
Inbound call from patient requesting a call please.  States he has had the hiccups and is worried he may have a something more severe that is causing it.  Please advise.

## 2021-04-05 ENCOUNTER — Other Ambulatory Visit: Payer: Self-pay

## 2021-04-05 MED FILL — Valsartan-Hydrochlorothiazide Tab 160-25 MG: ORAL | 30 days supply | Qty: 30 | Fill #0 | Status: AC

## 2021-04-05 MED FILL — Amlodipine Besylate Tab 10 MG (Base Equivalent): ORAL | 30 days supply | Qty: 30 | Fill #0 | Status: AC

## 2021-04-05 MED FILL — Valsartan-Hydrochlorothiazide Tab 160-25 MG: ORAL | 90 days supply | Qty: 90 | Fill #0 | Status: CN

## 2021-04-05 NOTE — Telephone Encounter (Signed)
Left message on machine to call back  

## 2021-04-06 ENCOUNTER — Other Ambulatory Visit: Payer: Self-pay

## 2021-04-06 NOTE — Telephone Encounter (Signed)
Left message on machine to call back will send a My Chart message to inquire if pt continues to have issues.  I have been unable to reach by phone.

## 2021-05-11 ENCOUNTER — Other Ambulatory Visit: Payer: Self-pay

## 2021-05-11 ENCOUNTER — Ambulatory Visit: Payer: Medicare Other | Attending: Critical Care Medicine | Admitting: Critical Care Medicine

## 2021-05-11 ENCOUNTER — Encounter: Payer: Self-pay | Admitting: Critical Care Medicine

## 2021-05-11 DIAGNOSIS — C187 Malignant neoplasm of sigmoid colon: Secondary | ICD-10-CM | POA: Diagnosis not present

## 2021-05-11 DIAGNOSIS — J381 Polyp of vocal cord and larynx: Secondary | ICD-10-CM

## 2021-05-11 DIAGNOSIS — J449 Chronic obstructive pulmonary disease, unspecified: Secondary | ICD-10-CM

## 2021-05-11 DIAGNOSIS — R490 Dysphonia: Secondary | ICD-10-CM

## 2021-05-11 DIAGNOSIS — I10 Essential (primary) hypertension: Secondary | ICD-10-CM

## 2021-05-11 DIAGNOSIS — R1310 Dysphagia, unspecified: Secondary | ICD-10-CM | POA: Insufficient documentation

## 2021-05-11 DIAGNOSIS — C189 Malignant neoplasm of colon, unspecified: Secondary | ICD-10-CM

## 2021-05-11 DIAGNOSIS — N529 Male erectile dysfunction, unspecified: Secondary | ICD-10-CM

## 2021-05-11 DIAGNOSIS — Z87891 Personal history of nicotine dependence: Secondary | ICD-10-CM

## 2021-05-11 MED ORDER — ALBUTEROL SULFATE 108 (90 BASE) MCG/ACT IN AEPB
INHALATION_SPRAY | RESPIRATORY_TRACT | 4 refills | Status: DC
  Filled 2021-05-11: qty 1, 25d supply, fill #0

## 2021-05-11 MED ORDER — AMLODIPINE BESYLATE 10 MG PO TABS
10.0000 mg | ORAL_TABLET | Freq: Every day | ORAL | 3 refills | Status: DC
  Filled 2021-05-11: qty 30, 30d supply, fill #0
  Filled 2021-07-05: qty 30, 30d supply, fill #1
  Filled 2021-08-23: qty 30, 30d supply, fill #2

## 2021-05-11 MED ORDER — BUDESONIDE-FORMOTEROL FUMARATE 160-4.5 MCG/ACT IN AERO
2.0000 | INHALATION_SPRAY | Freq: Two times a day (BID) | RESPIRATORY_TRACT | 0 refills | Status: DC
  Filled 2021-05-11: qty 10.2, 30d supply, fill #0

## 2021-05-11 MED ORDER — VALSARTAN-HYDROCHLOROTHIAZIDE 160-25 MG PO TABS
1.0000 | ORAL_TABLET | Freq: Every day | ORAL | 1 refills | Status: DC
  Filled 2021-05-11: qty 30, 30d supply, fill #0
  Filled 2021-07-05: qty 30, 30d supply, fill #1
  Filled 2021-08-23: qty 30, 30d supply, fill #2

## 2021-05-11 MED ORDER — SILDENAFIL CITRATE 100 MG PO TABS
100.0000 mg | ORAL_TABLET | Freq: Every day | ORAL | 1 refills | Status: DC | PRN
  Filled 2021-05-11: qty 6, 15d supply, fill #0

## 2021-05-11 NOTE — Progress Notes (Signed)
Established Patient Office Visit  Subjective:  Patient ID: Jordan [name], male    DOB: 30-Oct-1956  Age: 65 y.o. MRN: [id number] Virtual Visit via Telephone Note  I connected with Jordan [name] on 05/11/21 at  2:00 PM EDT by telephone and verified that I am speaking with the correct person using two identifiers.   Consent:  I discussed the limitations, risks, security and privacy concerns of performing an evaluation and management service by telephone and the availability of in person appointments. I also discussed with the patient that there may be a patient responsible charge related to this service. The patient expressed understanding and agreed to proceed.  Location of patient: Patient is at home patient is at home Location of provider: I am in my office  Persons participating in the televisit with the patient.   No one else on the call   History of Present Illness:  CC: colon cancer f/u   HPI Jordan [name] presents for a follow up appointment regarding his colonic polyp, hypertension, COPD, and erectile dysfunction. The patient has a history of an adenomatous polyp of the sigmoid colon which was removed. The patient's case was reviewed by the tumor board. The note from tumor board states the most recent pathology from the sigmoidoscopy the patient underwent in April revealed no precancerous tissue. However, there is some concern of lymph node involvement of the regional lymph nodes. The patient had a meeting with the surgeon recently. She told him the chance of recurrence or lymph node involvement was about 10-20%. She stated undergoing surgery to have the affected section of the colon removed or being monitored every 6 months in the office were both reasonable options. She told the patient the decision was up to him. He feels he is more inclined to not undergo surgery and rather follow up every 6 months.   The patient has hypertension and takes valsartan-hydrochlorothiazide  160-25 mg daily. The patient does not check his blood pressure at home. The patient's most recent blood pressure in this office was 137/77.   The patient has COPD. He takes Symbicort 2 puffs twice daily and albuterol as needed for wheezing or shortness of breath. The patient no longer smokes and states he quits smoking last year.   The patient has also had some concerns regarding his throat. He states recently he feels as though something is "stuck in this throat and he has difficulty swallowing. He has been using his albuterol inhaler at night to see if this would help his throat symptoms. The patient denies any throat pain or changes in his voice.   The patient also reports he recently had hiccups for a week which resolved spontaneously. He was also experiencing acid reflux symptoms at the time, though this also resolved spontaneously.  The patient has erectile dysfunction for which he takes sildenafil 100 mg as needed. He states this medication has been helpful and he would like a refill today.   Past Medical History:  Diagnosis Date   Allergy    seasonal   Aneurysm of infrarenal abdominal aorta (HCC)    3.4 cm per Jordan mcclung pa 04-16-2020 office note   Colon cancer ([place])    COPD (chronic obstructive pulmonary disease) ([place])    Family history of adverse reaction to anesthesia    mother slow to awaken   GERD (gastroesophageal reflux disease)    Hepatitis    hepatitis c dx in remission per pt   Hypertension  Inguinal hernia    Multiple fractures 06/04/2020   Positive colorectal cancer screening using Cologuard test 08/27/2020   Substance abuse ([place])    Wears glasses    for reading    Past Surgical History:  Procedure Laterality Date   COLONOSCOPY  11/11/2020   ENDOSCOPIC MUCOSAL RESECTION N/A 03/10/2021   Procedure: ENDOSCOPIC MUCOSAL RESECTION;  Surgeon: Irving Copas., MD;  Location: [facility];  Service: Gastroenterology;  Laterality: N/A;   FLEXIBLE  SIGMOIDOSCOPY N/A 03/10/2021   Procedure: FLEXIBLE SIGMOIDOSCOPY;  Surgeon: Rush Landmark Telford Nab., MD;  Location: [facility];  Service: Gastroenterology;  Laterality: N/A;   HEMOSTASIS CLIP PLACEMENT  03/10/2021   Procedure: HEMOSTASIS CLIP PLACEMENT;  Surgeon: Irving Copas., MD;  Location: [facility];  Service: Gastroenterology;;   INGUINAL HERNIA REPAIR Right 01/19/2021   Procedure: LAPAROSCOPIC RIGHT INGUINAL HERNIA REPAIR WITH MESH;  Surgeon: Stechschulte, Nickola Major, MD;  Location: [facility];  Service: General;  Laterality: Right;   obstruction removed as a child   swallowed a peanut   POLYPECTOMY  03/10/2021   Procedure: POLYPECTOMY;  Surgeon: Mansouraty, Telford Nab., MD;  Location: [facility];  Service: Gastroenterology;;   Lia [place]oyer LIFTING INJECTION  03/10/2021   Procedure: SUBMUCOSAL LIFTING INJECTION;  Surgeon: Irving Copas., MD;  Location: [facility];  Service: Gastroenterology;;    Family History  Problem Relation Age of Onset   Cancer Sister    Colon polyps Sister    Colon polyps Father    Stomach cancer Father 76   Colon polyps Brother    Colon cancer Neg Hx    Esophageal cancer Neg Hx    Rectal cancer Neg Hx    Inflammatory bowel disease Neg Hx    Liver disease Neg Hx    Pancreatic cancer Neg Hx     Social History   Socioeconomic History   Marital status: Divorced    Spouse name: Not on file   Number of children: Not on file   Years of education: Not on file   Highest education level: Not on file  Occupational History   Not on file  Tobacco Use   Smoking status: Former    Pack years: 0.00    Types: Cigarettes    Quit date: 03/14/2020    Years since quitting: 1.1   Smokeless tobacco: Never  Vaping Use   Vaping Use: Never used  Substance and Sexual Activity   Alcohol use: Not Currently    Alcohol/week: 1.0 standard drink    Types: 1 Cans of beer per week    Comment: quit 2000   Drug use: Yes    Types: Cocaine     Comment: last use cocaine 12-30-2020 per pt   Sexual activity: Not on file  Other Topics Concern   Not on file  Social History Narrative   Not on file   Social Determinants of Health   Financial Resource Strain: Not on file  Food Insecurity: Not on file  Transportation Needs: Not on file  Physical Activity: Not on file  Stress: Not on file  Social Connections: Not on file  Intimate Partner Violence: Not on file    Outpatient Medications Prior to Visit  Medication Sig Dispense Refill   Albuterol Sulfate (PROAIR RESPICLICK) 540 (90 Base) MCG/ACT AEPB INHALE 2 PUFFS INTO THE LUNGS EVERY 6 (SIX) HOURS AS NEEDED. 1 each 4   amLODipine (NORVASC) 10 MG tablet TAKE 1 TABLET (10 MG TOTAL) BY MOUTH DAILY. (Patient taking differently: Take 10 mg  by mouth daily.) 90 tablet 3   budesonide-formoterol (SYMBICORT) 160-4.5 MCG/ACT inhaler INHALE 2 PUFFS BY MOUTH TWICE DAILY. (Patient taking differently: Inhale 2 puffs into the lungs 2 (two) times daily as needed (shortness of breath).) 10.2 g 0   sildenafil (VIAGRA) 100 MG tablet TAKE 1 TABLET (100 MG TOTAL) BY MOUTH DAILY AS NEEDED FOR ERECTILE DYSFUNCTION. (Patient taking differently: Take 100 mg by mouth daily as needed for erectile dysfunction.) 5 tablet 1   valsartan-hydrochlorothiazide (DIOVAN-HCT) 160-25 MG tablet TAKE 1 TABLET BY MOUTH DAILY. 90 tablet 1   No facility-administered medications prior to visit.    No Known Allergies  ROS Review of Systems  Constitutional:  Negative for chills and fever.  HENT:  Positive for trouble swallowing. Negative for sore throat and voice change.   Eyes: Negative.   Respiratory:  Positive for cough (nonproductive).   Cardiovascular: Negative.   Gastrointestinal: Negative.   Endocrine: Negative.   Genitourinary:        Erectile dysfunction  Musculoskeletal: Negative.   Skin: Negative.   Psychiatric/Behavioral: Negative.       Objective:    Physical Exam  This patient was conducted on the  phone. No physical exam was performed.   There were no vitals taken for this visit. Wt Readings from Last 3 Encounters:  03/10/21 163 lb (73.9 kg)  01/19/21 161 lb 8 oz (73.3 kg)  01/18/21 165 lb (74.8 kg)     Health Maintenance Due  Topic Date Due   Zoster Vaccines- Shingrix (1 of 2) Never done   COVID-19 Vaccine (3 - Pfizer risk series) 04/22/2020    There are no preventive care reminders to display for this patient.  No results found for: TSH Lab Results  Component Value Date   WBC 4.4 01/18/2021   HGB 16.6 01/18/2021   HCT 49.6 01/18/2021   MCV 90.0 01/18/2021   PLT 233 01/18/2021   Lab Results  Component Value Date   NA 137 01/18/2021   K 4.1 01/18/2021   CO2 26 01/18/2021   GLUCOSE 100 (H) 01/18/2021   BUN 36 (H) 01/18/2021   CREATININE 1.54 (H) 01/18/2021   BILITOT 0.6 01/18/2021   ALKPHOS 70 01/18/2021   AST 36 01/18/2021   ALT 48 (H) 01/18/2021   PROT 7.2 01/18/2021   ALBUMIN 3.9 01/18/2021   CALCIUM 8.9 01/18/2021   ANIONGAP 8 01/18/2021   No results found for: CHOL No results found for: HDL No results found for: LDLCALC No results found for: TRIG No results found for: CHOLHDL No results found for: HGBA1C    Assessment & Plan:   Problem List Items Addressed This Visit       Cardiovascular and Mediastinum   Hypertension    Patient has been taking his hypertension medications consistently but has not been able to monitor his blood pressure at home. Advised to monitor blood pressure at home if possible.  Continue valsartan-HCTZ daily.  Follow up at next office visit.        Relevant Medications   amLODipine (NORVASC) 10 MG tablet   sildenafil (VIAGRA) 100 MG tablet   valsartan-hydrochlorothiazide (DIOVAN-HCT) 160-25 MG tablet     Respiratory   COPD with chronic bronchitis (HCC)    Patient with nonproductive cough otherwise breathing is stable. Continue Symbicort two puffs twice daily. Continue albuterol as needed for wheezing or  shortness of breath.  Follow up at next office visit.        Relevant Medications   Albuterol Sulfate (  PROAIR RESPICLICK) 631 (90 Base) MCG/ACT AEPB   budesonide-formoterol (SYMBICORT) 160-4.5 MCG/ACT inhaler     Digestive   Colon cancer ([place]) - Primary   Relevant Orders   CEA   Adenocarcinoma of colon ([place])    Patient's most recent pathology revealed no precancerous tissue though there is still concern for possible lymph node involvement.  Reviewed Tumor Board note and advice with the patient. Discussed risks and benefits of surgical resection of the colon verses continuing regular follow up with specialists every six months. Ordered CEA for when the patient comes to the clinic for a lab visit.  Follow up at next appointment.        Difficulty swallowing    Patient with history of cigarette smoking having difficulty swallowing without sore throat or voice changes.  Referred to ENT. Follow up at next office visit.          Other   History of tobacco use    Patient quit smoking one year ago.        Relevant Orders   Ambulatory referral to ENT   Erectile dysfunction    Patient's symptoms are well controlled on Sildenafil 100 mg. Continue Sildenafil 100 mg as needed.  Follow up at next office visit.        Other Visit Diagnoses     Dysphonia       Relevant Orders   Ambulatory referral to ENT   Vocal cord polyp       Relevant Orders   Ambulatory referral to ENT       Meds ordered this encounter  Medications   Albuterol Sulfate (PROAIR RESPICLICK) 497 (90 Base) MCG/ACT AEPB    Sig: INHALE 2 PUFFS INTO THE LUNGS EVERY 6 (SIX) HOURS AS NEEDED.    Dispense:  1 each    Refill:  4   amLODipine (NORVASC) 10 MG tablet    Sig: Take 1 tablet (10 mg total) by mouth daily.    Dispense:  90 tablet    Refill:  3   budesonide-formoterol (SYMBICORT) 160-4.5 MCG/ACT inhaler    Sig: INHALE 2 PUFFS BY MOUTH TWICE DAILY.    Dispense:  10.2 g    Refill:  0   sildenafil  (VIAGRA) 100 MG tablet    Sig: Take 1 tablet (100 mg total) by mouth daily as needed for erectile dysfunction.    Dispense:  6 tablet    Refill:  1   valsartan-hydrochlorothiazide (DIOVAN-HCT) 160-25 MG tablet    Sig: TAKE 1 TABLET BY MOUTH DAILY.    Dispense:  90 tablet    Refill:  1    Follow-up: Return in about 2 months (around 07/11/2021).   Follow Up Instructions: Patient knows a direct office exam will occur short-term and referral to ENT will be made  We both agree the patient will hold off on surgical intervention at this time this:   I discussed the assessment and treatment plan with the patient. The patient was provided an opportunity to ask questions and all were answered. The patient agreed with the plan and demonstrated an understanding of the instructions.   The patient was advised to call back or seek an in-person evaluation if the symptoms worsen or if the condition fails to improve as anticipated.  I provided 31 minutes of non-face-to-face time during this encounter  including  median intraservice time , review of notes, labs, imaging, medications  and explaining diagnosis and management to the patient .  Asencion Noble, MD

## 2021-05-11 NOTE — Assessment & Plan Note (Signed)
Patient's most recent pathology revealed no precancerous tissue though there is still concern for possible lymph node involvement.  Reviewed Tumor Board note and advice with the patient. Discussed risks and benefits of surgical resection of the colon verses continuing regular follow up with specialists every six months. Ordered CEA for when the patient comes to the clinic for a lab visit.  Follow up at next appointment.

## 2021-05-11 NOTE — Assessment & Plan Note (Signed)
Patient with history of cigarette smoking having difficulty swallowing without sore throat or voice changes.  Referred to ENT. Follow up at next office visit.

## 2021-05-11 NOTE — Assessment & Plan Note (Signed)
Patient's symptoms are well controlled on Sildenafil 100 mg. Continue Sildenafil 100 mg as needed.  Follow up at next office visit.

## 2021-05-11 NOTE — Assessment & Plan Note (Signed)
Patient has been taking his hypertension medications consistently but has not been able to monitor his blood pressure at home. Advised to monitor blood pressure at home if possible.  Continue valsartan-HCTZ daily.  Follow up at next office visit.

## 2021-05-11 NOTE — Assessment & Plan Note (Signed)
Patient with nonproductive cough otherwise breathing is stable. Continue Symbicort two puffs twice daily. Continue albuterol as needed for wheezing or shortness of breath.  Follow up at next office visit.

## 2021-05-11 NOTE — Assessment & Plan Note (Signed)
Patient quit smoking one year ago.

## 2021-05-12 ENCOUNTER — Other Ambulatory Visit: Payer: Self-pay

## 2021-05-12 ENCOUNTER — Ambulatory Visit: Payer: Medicare Other | Attending: Critical Care Medicine

## 2021-05-12 DIAGNOSIS — C187 Malignant neoplasm of sigmoid colon: Secondary | ICD-10-CM

## 2021-05-13 LAB — CEA: CEA: 5.6 ng/mL — ABNORMAL HIGH (ref 0.0–4.7)

## 2021-05-18 ENCOUNTER — Other Ambulatory Visit: Payer: Self-pay

## 2021-05-18 ENCOUNTER — Ambulatory Visit: Payer: Medicare Other | Admitting: Critical Care Medicine

## 2021-06-02 ENCOUNTER — Ambulatory Visit (INDEPENDENT_AMBULATORY_CARE_PROVIDER_SITE_OTHER): Payer: Medicare Other | Admitting: Nurse Practitioner

## 2021-06-02 ENCOUNTER — Other Ambulatory Visit: Payer: Self-pay

## 2021-06-02 VITALS — BP 153/87 | HR 72 | Temp 97.9°F | Resp 18 | Ht 71.0 in | Wt 168.0 lb

## 2021-06-02 DIAGNOSIS — Z0000 Encounter for general adult medical examination without abnormal findings: Secondary | ICD-10-CM

## 2021-06-02 NOTE — Progress Notes (Signed)
Subjective:   Jordan [name] is a 65 y.o. male who presents for Medicare Annual/Subsequent preventive examination.  Review of Systems    Review of Systems  Constitutional: Negative.   HENT: Negative.    Eyes: Negative.   Respiratory: Negative.    Cardiovascular: Negative.   Gastrointestinal: Negative.   Genitourinary: Negative.   Musculoskeletal: Negative.   Skin: Negative.   Neurological: Negative.   Endo/Heme/Allergies: Negative.   Psychiatric/Behavioral: Negative.     Cardiac Risk Factors include: advanced age (>26men, >37 women);hypertension;male gender     Objective:    Today's Vitals   06/02/21 0853  BP: (!) 153/87  Pulse: 72  Resp: 18  Temp: 97.9 F (36.6 C)  SpO2: 98%  Weight: 168 lb (76.2 kg)  Height: 5\' 11"  (1.803 m)   Body mass index is 23.43 kg/m.  Advanced Directives 06/02/2021 03/10/2021 01/19/2021 06/04/2020 04/04/2020  Does Patient Have a Medical Advance Directive? No No No No No  Would patient like information on creating a medical advance directive? No - Patient declined No - Patient declined No - Patient declined - -    Current Medications (verified) Outpatient Encounter Medications as of 06/02/2021  Medication Sig   Albuterol Sulfate (PROAIR RESPICLICK) 242 (90 Base) MCG/ACT AEPB INHALE 2 PUFFS INTO THE LUNGS EVERY 6 (SIX) HOURS AS NEEDED.   amLODipine (NORVASC) 10 MG tablet Take 1 tablet (10 mg total) by mouth daily.   budesonide-formoterol (SYMBICORT) 160-4.5 MCG/ACT inhaler INHALE 2 PUFFS BY MOUTH TWICE DAILY.   sildenafil (VIAGRA) 100 MG tablet Take 1 tablet (100 mg total) by mouth daily as needed for erectile dysfunction.   valsartan-hydrochlorothiazide (DIOVAN-HCT) 160-25 MG tablet TAKE 1 TABLET BY MOUTH DAILY.   No facility-administered encounter medications on file as of 06/02/2021.    Allergies (verified) Patient has no known allergies.   History: Past Medical History:  Diagnosis Date   Allergy    seasonal   Aneurysm of  infrarenal abdominal aorta (HCC)    3.4 cm per angela mcclung pa 04-16-2020 office note   Colon cancer ([place])    COPD (chronic obstructive pulmonary disease) ([place])    Family history of adverse reaction to anesthesia    mother slow to awaken   GERD (gastroesophageal reflux disease)    Hepatitis    hepatitis c dx in remission per pt   Hypertension    Inguinal hernia    Multiple fractures 06/04/2020   Positive colorectal cancer screening using Cologuard test 08/27/2020   Substance abuse ([place])    Wears glasses    for reading   Past Surgical History:  Procedure Laterality Date   COLONOSCOPY  11/11/2020   ENDOSCOPIC MUCOSAL RESECTION N/A 03/10/2021   Procedure: ENDOSCOPIC MUCOSAL RESECTION;  Surgeon: Irving Copas., MD;  Location: [facility];  Service: Gastroenterology;  Laterality: N/A;   FLEXIBLE SIGMOIDOSCOPY N/A 03/10/2021   Procedure: FLEXIBLE SIGMOIDOSCOPY;  Surgeon: Rush Landmark Telford Nab., MD;  Location: [facility];  Service: Gastroenterology;  Laterality: N/A;   HEMOSTASIS CLIP PLACEMENT  03/10/2021   Procedure: HEMOSTASIS CLIP PLACEMENT;  Surgeon: Irving Copas., MD;  Location: [facility];  Service: Gastroenterology;;   INGUINAL HERNIA REPAIR Right 01/19/2021   Procedure: LAPAROSCOPIC RIGHT INGUINAL HERNIA REPAIR WITH MESH;  Surgeon: Thermon Leyland Nickola Major, MD;  Location: [facility];  Service: General;  Laterality: Right;   obstruction removed as a child   swallowed a peanut   POLYPECTOMY  03/10/2021   Procedure: POLYPECTOMY;  Surgeon: Irving Copas., MD;  Location: [facility];  Service: Gastroenterology;;   SUBMUCOSAL LIFTING INJECTION  03/10/2021   Procedure: SUBMUCOSAL LIFTING INJECTION;  Surgeon: Irving Copas., MD;  Location: [facility];  Service: Gastroenterology;;   Family History  Problem Relation Age of Onset   Cancer Sister    Colon polyps Sister    Colon polyps Father    Stomach cancer Father 75   Colon polyps  Brother    Colon cancer Neg Hx    Esophageal cancer Neg Hx    Rectal cancer Neg Hx    Inflammatory bowel disease Neg Hx    Liver disease Neg Hx    Pancreatic cancer Neg Hx    Social History   Socioeconomic History   Marital status: Divorced    Spouse name: Not on file   Number of children: Not on file   Years of education: Not on file   Highest education level: Not on file  Occupational History   Not on file  Tobacco Use   Smoking status: Former    Types: Cigarettes    Quit date: 03/14/2020    Years since quitting: 1.2   Smokeless tobacco: Never  Vaping Use   Vaping Use: Never used  Substance and Sexual Activity   Alcohol use: Not Currently    Alcohol/week: 1.0 standard drink    Types: 1 Cans of beer per week    Comment: quit 2000   Drug use: Yes    Types: Cocaine    Comment: last use cocaine 12-30-2020 per pt   Sexual activity: Not on file  Other Topics Concern   Not on file  Social History Narrative   Not on file   Social Determinants of Health   Financial Resource Strain: Medium Risk   Difficulty of Paying Living Expenses: Somewhat hard  Food Insecurity: No Food Insecurity   Worried About Charity fundraiser in the Last Year: Never true   Ran Out of Food in the Last Year: Never true  Transportation Needs: No Transportation Needs   Lack of Transportation (Medical): No   Lack of Transportation (Non-Medical): No  Physical Activity: Sufficiently Active   Days of Exercise per Week: 5 days   Minutes of Exercise per Session: 30 min  Stress: Stress Concern Present   Feeling of Stress : To some extent  Social Connections: Moderately Isolated   Frequency of Communication with Friends and Family: More than three times a week   Frequency of Social Gatherings with Friends and Family: Once a week   Attends Religious Services: Never   Marine scientist or Organizations: Yes   Attends Music therapist: More than 4 times per year   Marital Status:  Divorced    Tobacco Counseling Counseling given: Yes   Clinical Intake:  Pre-visit preparation completed: No  Pain : No/denies pain     BMI - recorded: 23.43 Nutritional Status: BMI of 19-24  Normal Nutritional Risks: None Diabetes: No  How often do you need to have someone help you when you read instructions, pamphlets, or other written materials from your doctor or pharmacy?: 1 - Never What is the last grade level you completed in school?: 6 years college  Diabetic? no  Interpreter Needed?: No      Activities of Daily Living In your present state of health, do you have any difficulty performing the following activities: 06/02/2021 01/19/2021  Hearing? N N  Vision? N N  Difficulty concentrating or making decisions? N N  Walking or  climbing stairs? N N  Dressing or bathing? N N  Doing errands, shopping? N -  Preparing Food and eating ? N -  Using the Toilet? N -  In the past six months, have you accidently leaked urine? N -  Do you have problems with loss of bowel control? N -  Managing your Medications? N -  Managing your Finances? N -  Housekeeping or managing your Housekeeping? N -  Some recent data might be hidden    Patient Care Team: Elsie Stain, MD as PCP - General (Pulmonary Disease)  Indicate any recent Medical Services you may have received from other than [facility] providers in the past year (date may be approximate).     Assessment:   This is a routine wellness examination for [place].   Dietary issues and exercise activities discussed: Current Exercise Habits: The patient does not participate in regular exercise at present, Exercise limited by: None identified   Goals Addressed             This Visit's Progress    DIET - REDUCE FAT INTAKE         Depression Screen PHQ 2/9 Scores 06/02/2021 01/12/2021 11/23/2020 08/11/2020 06/04/2020  PHQ - 2 Score 0 0 0 0 0  PHQ- 9 Score - 1 2 - 1    Fall Risk Fall Risk  06/02/2021 11/23/2020 08/11/2020  06/04/2020  Falls in the past year? 1 1 0 1  Comment - cutting down tree limbs. Ladder was moved from underneath him - -  Number falls in past yr: 0 0 0 0  Injury with Fall? 1 1 0 1  Comment - broken ribs - -  Risk for fall due to : History of fall(s) No Fall Risks - -  Follow up Education provided Falls evaluation completed - -    FALL RISK PREVENTION PERTAINING TO THE HOME:  Any stairs in or around the home? Yes  If so, are there any without handrails? No  Home free of loose throw rugs in walkways, pet beds, electrical cords, etc? Yes  Adequate lighting in your home to reduce risk of falls? Yes   ASSISTIVE DEVICES UTILIZED TO PREVENT FALLS:  Life alert? No  Use of a cane, walker or w/c? No  Grab bars in the bathroom? No  Shower chair or bench in shower? No  Elevated toilet seat or a handicapped toilet? No   TIMED UP AND GO:  Was the test performed? Yes .  Length of time to ambulate 10 feet: 3 sec.   Gait steady and fast without use of assistive device  Cognitive Function:        Immunizations Immunization History  Administered Date(s) Administered   PFIZER(Purple Top)SARS-COV-2 Vaccination 02/29/2020, 03/25/2020   Pneumococcal Conjugate-13 06/04/2020   Tdap 06/04/2020    TDAP status: Up to date  Flu Vaccine status: Due, Education has been provided regarding the importance of this vaccine. Advised may receive this vaccine at local pharmacy or Health Dept. Aware to provide a copy of the vaccination record if obtained from local pharmacy or Health Dept. Verbalized acceptance and understanding.  Pneumococcal vaccine status: Due, Education has been provided regarding the importance of this vaccine. Advised may receive this vaccine at local pharmacy or Health Dept. Aware to provide a copy of the vaccination record if obtained from local pharmacy or Health Dept. Verbalized acceptance and understanding.  Covid-19 vaccine status: Completed vaccines  Qualifies for Shingles  Vaccine? No   Zostavax completed  No   Shingrix Completed?: No.    Education has been provided regarding the importance of this vaccine. Patient has been advised to call insurance company to determine out of pocket expense if they have not yet received this vaccine. Advised may also receive vaccine at local pharmacy or Health Dept. Verbalized acceptance and understanding.  Screening Tests Health Maintenance  Topic Date Due   Zoster Vaccines- Shingrix (1 of 2) Never done   COVID-19 Vaccine (3 - Pfizer risk series) 04/22/2020   PNA vac Low Risk Adult (2 of 2 - PPSV23) 06/04/2021   INFLUENZA VACCINE  06/14/2021   Fecal DNA (Cologuard)  08/28/2023   TETANUS/TDAP  06/04/2030   Hepatitis C Screening  Completed   HPV VACCINES  Aged Out    Health Maintenance  Health Maintenance Due  Topic Date Due   Zoster Vaccines- Shingrix (1 of 2) Never done   COVID-19 Vaccine (3 - Pfizer risk series) 04/22/2020    Colorectal cancer screening: Type of screening: Colonoscopy. Completed  . Repeat every 1 years  Lung Cancer Screening: (Low Dose CT Chest recommended if Age 77-80 years, 30 pack-year currently smoking OR have quit w/in 15years.) does qualify.   Lung Cancer Screening Referral: PCP to order  Additional Screening:  Hepatitis C Screening: does qualify; Completed   Vision Screening: Recommended annual ophthalmology exams for early detection of glaucoma and other disorders of the eye. Is the patient up to date with their annual eye exam?  Yes  Who is the provider or what is the name of the office in which the patient attends annual eye exams? NA If pt is not established with a provider, would they like to be referred to a provider to establish care? No .   Dental Screening: Recommended annual dental exams for proper oral hygiene  Community Resource Referral / Chronic Care Management: CRR required this visit?  No   CCM required this visit?  No      Plan:     I have personally  reviewed and noted the following in the patient's chart:   Medical and social history Use of alcohol, tobacco or illicit drugs  Current medications and supplements including opioid prescriptions. Patient is not currently taking opioid prescriptions. Functional ability and status Nutritional status Physical activity Advanced directives List of other physicians Hospitalizations, surgeries, and ER visits in previous 12 months Vitals Screenings to include cognitive, depression, and falls Referrals and appointments  In addition, I have reviewed and discussed with patient certain preventive protocols, quality metrics, and best practice recommendations. A written personalized care plan for preventive services as well as general preventive health recommendations were provided to patient.     Fenton Foy, NP   06/02/2021

## 2021-06-02 NOTE — Patient Instructions (Addendum)
Jordan [name] , Thank you for taking time to come for your Medicare Wellness Visit. I appreciate your ongoing commitment to your health goals. Please review the following plan we discussed and let me know if I can assist you in the future.   These are the goals we discussed:  Goals     . DIET - REDUCE FAT INTAKE        This is a list of the screening recommended for you and due dates:  Health Maintenance  Topic Date Due  . Zoster (Shingles) Vaccine (1 of 2) Never done  . COVID-19 Vaccine (3 - Pfizer risk series) 04/22/2020  . Pneumonia vaccines (2 of 2 - PPSV23) 06/04/2021  . Flu Shot  06/14/2021  . Cologuard (Stool DNA test)  08/28/2023  . Tetanus Vaccine  06/04/2030  . Hepatitis C Screening: USPSTF Recommendation to screen - Ages 41-79 yo.  Completed  . HPV Vaccine  Aged Out    Fall Prevention in the Home, Adult Falls can cause injuries and can happen to people of all ages. There are many things you can do to make your home safe and to help prevent falls. Ask forhelp when making these changes. What actions can I take to prevent falls? General Instructions Use good lighting in all rooms. Replace any light bulbs that burn out. Turn on the lights in dark areas. Use night-lights. Keep items that you use often in easy-to-reach places. Lower the shelves around your home if needed. Set up your furniture so you have a clear path. Avoid moving your furniture around. Do not have throw rugs or other things on the floor that can make you trip. Avoid walking on wet floors. If any of your floors are uneven, fix them. Add color or contrast paint or tape to clearly mark and help you see: Grab bars or handrails. First and last steps of staircases. Where the edge of each step is. If you use a stepladder: Make sure that it is fully opened. Do not climb a closed stepladder. Make sure the sides of the stepladder are locked in place. Ask someone to hold the stepladder while you use it. Know  where your pets are when moving through your home. What can I do in the bathroom?     Keep the floor dry. Clean up any water on the floor right away. Remove soap buildup in the tub or shower. Use nonskid mats or decals on the floor of the tub or shower. Attach bath mats securely with double-sided, nonslip rug tape. If you need to sit down in the shower, use a plastic, nonslip stool. Install grab bars by the toilet and in the tub and shower. Do not use towel bars as grab bars. What can I do in the bedroom? Make sure that you have a light by your bed that is easy to reach. Do not use any sheets or blankets for your bed that hang to the floor. Have a firm chair with side arms that you can use for support when you get dressed. What can I do in the kitchen? Clean up any spills right away. If you need to reach something above you, use a step stool with a grab bar. Keep electrical cords out of the way. Do not use floor polish or wax that makes floors slippery. What can I do with my stairs? Do not leave any items on the stairs. Make sure that you have a light switch at the top and the bottom of  the stairs. Make sure that there are handrails on both sides of the stairs. Fix handrails that are broken or loose. Install nonslip stair treads on all your stairs. Avoid having throw rugs at the top or bottom of the stairs. Choose a carpet that does not hide the edge of the steps on the stairs. Check carpeting to make sure that it is firmly attached to the stairs. Fix carpet that is loose or worn. What can I do on the outside of my home? Use bright outdoor lighting. Fix the edges of walkways and driveways and fix any cracks. Remove anything that might make you trip as you walk through a door, such as a raised step or threshold. Trim any bushes or trees on paths to your home. Check to see if handrails are loose or broken and that both sides of all steps have handrails. Install guardrails along the  edges of any raised decks and porches. Clear paths of anything that can make you trip, such as tools or rocks. Have leaves, snow, or ice cleared regularly. Use sand or salt on paths during winter. Clean up any spills in your garage right away. This includes grease or oil spills. What other actions can I take? Wear shoes that: Have a low heel. Do not wear high heels. Have rubber bottoms. Feel good on your feet and fit well. Are closed at the toe. Do not wear open-toe sandals. Use tools that help you move around if needed. These include: Canes. Walkers. Scooters. Crutches. Review your medicines with your doctor. Some medicines can make you feel dizzy. This can increase your chance of falling. Ask your doctor what else you can do to help prevent falls. Where to find more information Centers for Disease Control and Prevention, [employer]: http://www.wolf.info/ National Institute on Aging: http://kim-miller.com/ Contact a doctor if: You are afraid of falling at home. You feel weak, drowsy, or dizzy at home. You fall at home. Summary There are many simple things that you can do to make your home safe and to help prevent falls. Ways to make your home safe include removing things that can make you trip and installing grab bars in the bathroom. Ask for help when making these changes in your home. This information is not intended to replace advice given to you by your health care provider. Make sure you discuss any questions you have with your healthcare provider. Document Revised: 06/03/2020 Document Reviewed: 06/03/2020 Elsevier Patient Education  [place] Maintenance, Male Adopting a healthy lifestyle and getting preventive care are important in promoting health and wellness. Ask your health care provider about: The right schedule for you to have regular tests and exams. Things you can do on your own to prevent diseases and keep yourself healthy. What should I know about diet, weight, and  exercise? Eat a healthy diet  Eat a diet that includes plenty of vegetables, fruits, low-fat dairy products, and lean protein. Do not eat a lot of foods that are high in solid fats, added sugars, or sodium.  Maintain a healthy weight Body mass index (BMI) is a measurement that can be used to identify possible weight problems. It estimates body fat based on height and weight. Your health care provider can help determine your BMI and help you achieve or maintain ahealthy weight. Get regular exercise Get regular exercise. This is one of the most important things you can do for your health. Most adults should: Exercise for at least 150 minutes each week. The  exercise should increase your heart rate and make you sweat (moderate-intensity exercise). Do strengthening exercises at least twice a week. This is in addition to the moderate-intensity exercise. Spend less time sitting. Even light physical activity can be beneficial. Watch cholesterol and blood lipids Have your blood tested for lipids and cholesterol at 66 years of age, then havethis test every 5 years. You may need to have your cholesterol levels checked more often if: Your lipid or cholesterol levels are high. You are older than 66 years of age. You are at high risk for heart disease. What should I know about cancer screening? Many types of cancers can be detected early and may often be prevented. Depending on your health history and family history, you may need to have cancer screening at various ages. This may include screening for: Colorectal cancer. Prostate cancer. Skin cancer. Lung cancer. What should I know about heart disease, diabetes, and high blood pressure? Blood pressure and heart disease High blood pressure causes heart disease and increases the risk of stroke. This is more likely to develop in people who have high blood pressure readings, are of African descent, or are overweight. Talk with your health care provider  about your target blood pressure readings. Have your blood pressure checked: Every 3-5 years if you are 40-56 years of age. Every year if you are 81 years old or older. If you are between the ages of 97 and 55 and are a current or former smoker, ask your health care provider if you should have a one-time screening for abdominal aortic aneurysm (AAA). Diabetes Have regular diabetes screenings. This checks your fasting blood sugar level. Have the screening done: Once every three years after age 92 if you are at a normal weight and have a low risk for diabetes. More often and at a younger age if you are overweight or have a high risk for diabetes. What should I know about preventing infection? Hepatitis B If you have a higher risk for hepatitis B, you should be screened for this virus. Talk with your health care provider to find out if you are at risk forhepatitis B infection. Hepatitis C Blood testing is recommended for: Everyone born from [birth year] through 1966. Anyone with known risk factors for hepatitis C. Sexually transmitted infections (STIs) You should be screened each year for STIs, including gonorrhea and chlamydia, if: You are sexually active and are younger than 66 years of age. You are older than 66 years of age and your health care provider tells you that you are at risk for this type of infection. Your sexual activity has changed since you were last screened, and you are at increased risk for chlamydia or gonorrhea. Ask your health care provider if you are at risk. Ask your health care provider about whether you are at high risk for HIV. Your health care provider may recommend a prescription medicine to help prevent HIV infection. If you choose to take medicine to prevent HIV, you should first get tested for HIV. You should then be tested every 3 months for as long as you are taking the medicine. Follow these instructions at home: Lifestyle Do not use any products that contain nicotine  or tobacco, such as cigarettes, e-cigarettes, and chewing tobacco. If you need help quitting, ask your health care provider. Do not use street drugs. Do not share needles. Ask your health care provider for help if you need support or information about quitting drugs. Alcohol use Do not drink alcohol if  your health care provider tells you not to drink. If you drink alcohol: Limit how much you have to 0-2 drinks a day. Be aware of how much alcohol is in your drink. In the [place] one drink equals one 12 oz bottle of beer (355 mL), one 5 oz glass of wine (148 mL), or one 1 oz glass of hard liquor (44 mL). General instructions Schedule regular health, dental, and eye exams. Stay current with your vaccines. Tell your health care provider if: You often feel depressed. You have ever been abused or do not feel safe at home. Summary Adopting a healthy lifestyle and getting preventive care are important in promoting health and wellness. Follow your health care provider's instructions about healthy diet, exercising, and getting tested or screened for diseases. Follow your health care provider's instructions on monitoring your cholesterol and blood pressure. This information is not intended to replace advice given to you by your health care provider. Make sure you discuss any questions you have with your healthcare provider. Document Revised: 10/24/2018 Document Reviewed: 10/24/2018 Elsevier Patient Education  2022 [employer].

## 2021-06-06 NOTE — Progress Notes (Signed)
Established Patient Office Visit  Subjective:  Patient ID: Jordan [name], male    DOB: October 20, 1956  Age: 65 y.o. MRN: [id number]  History of Present Illness:  CC: colon cancer f/u   HPI 05/11/21 Jordan [name] presents for a follow up appointment regarding his colonic polyp, hypertension, COPD, and erectile dysfunction. The patient has a history of an adenomatous polyp of the sigmoid colon which was removed. The patient's case was reviewed by the tumor board. The note from tumor board states the most recent pathology from the sigmoidoscopy the patient underwent in April revealed no precancerous tissue. However, there is some concern of lymph node involvement of the regional lymph nodes. The patient had a meeting with the surgeon recently. She told him the chance of recurrence or lymph node involvement was about 10-20%. She stated undergoing surgery to have the affected section of the colon removed or being monitored every 6 months in the office were both reasonable options. She told the patient the decision was up to him. He feels he is more inclined to not undergo surgery and rather follow up every 6 months.   The patient has hypertension and takes valsartan-hydrochlorothiazide 160-25 mg daily. The patient does not check his blood pressure at home. The patient's most recent blood pressure in this office was 137/77.   The patient has COPD. He takes Symbicort 2 puffs twice daily and albuterol as needed for wheezing or shortness of breath. The patient no longer smokes and states he quits smoking last year.   The patient has also had some concerns regarding his throat. He states recently he feels as though something is "stuck in this throat and he has difficulty swallowing. He has been using his albuterol inhaler at night to see if this would help his throat symptoms. The patient denies any throat pain or changes in his voice.   The patient also reports he recently had hiccups for a week which  resolved spontaneously. He was also experiencing acid reflux symptoms at the time, though this also resolved spontaneously.  The patient has erectile dysfunction for which he takes sildenafil 100 mg as needed. He states this medication has been helpful and he would like a refill today.   06/07/2021 Patient returns in follow-up overall doing well he has an appointment in August for ENT evaluation of the throat.  The patient is maintaining blood pressure medications on arrival blood pressure is 145/75 on repeat it is 138/60  Patient has no other complaints at this time.  He is decided to hold off on surgical intervention of his colon.    Past Medical History:  Diagnosis Date   Allergy    seasonal   Aneurysm of infrarenal abdominal aorta (HCC)    3.4 cm per angela mcclung pa 04-16-2020 office note   Colon cancer ([place])    COPD (chronic obstructive pulmonary disease) ([place])    Family history of adverse reaction to anesthesia    mother slow to awaken   GERD (gastroesophageal reflux disease)    Hepatitis    hepatitis c dx in remission per pt   Hypertension    Inguinal hernia    Multiple fractures 06/04/2020   Positive colorectal cancer screening using Cologuard test 08/27/2020   Substance abuse ([place])    Wears glasses    for reading    Past Surgical History:  Procedure Laterality Date   COLONOSCOPY  11/11/2020   ENDOSCOPIC MUCOSAL RESECTION N/A 03/10/2021   Procedure: ENDOSCOPIC MUCOSAL RESECTION;  Surgeon: Irving Copas., MD;  Location: [facility];  Service: Gastroenterology;  Laterality: N/A;   FLEXIBLE SIGMOIDOSCOPY N/A 03/10/2021   Procedure: FLEXIBLE SIGMOIDOSCOPY;  Surgeon: Rush Landmark Telford Nab., MD;  Location: [facility];  Service: Gastroenterology;  Laterality: N/A;   HEMOSTASIS CLIP PLACEMENT  03/10/2021   Procedure: HEMOSTASIS CLIP PLACEMENT;  Surgeon: Irving Copas., MD;  Location: [facility];  Service: Gastroenterology;;   INGUINAL HERNIA REPAIR Right  01/19/2021   Procedure: LAPAROSCOPIC RIGHT INGUINAL HERNIA REPAIR WITH MESH;  Surgeon: Stechschulte, Nickola Major, MD;  Location: [place];  Service: General;  Laterality: Right;   obstruction removed as a child   swallowed a peanut   POLYPECTOMY  03/10/2021   Procedure: POLYPECTOMY;  Surgeon: Mansouraty, Telford Nab., MD;  Location: [facility];  Service: Gastroenterology;;   Lia Foyer LIFTING INJECTION  03/10/2021   Procedure: SUBMUCOSAL LIFTING INJECTION;  Surgeon: Irving Copas., MD;  Location: [facility];  Service: Gastroenterology;;    Family History  Problem Relation Age of Onset   Cancer Sister    Colon polyps Sister    Colon polyps Father    Stomach cancer Father 3   Colon polyps Brother    Colon cancer Neg Hx    Esophageal cancer Neg Hx    Rectal cancer Neg Hx    Inflammatory bowel disease Neg Hx    Liver disease Neg Hx    Pancreatic cancer Neg Hx     Social History   Socioeconomic History   Marital status: Divorced    Spouse name: Not on file   Number of children: Not on file   Years of education: Not on file   Highest education level: Not on file  Occupational History   Not on file  Tobacco Use   Smoking status: Former    Types: Cigarettes    Quit date: 03/14/2020    Years since quitting: 1.2   Smokeless tobacco: Never  Vaping Use   Vaping Use: Never used  Substance and Sexual Activity   Alcohol use: Not Currently    Alcohol/week: 1.0 standard drink    Types: 1 Cans of beer per week    Comment: quit 2000   Drug use: Yes    Types: Cocaine    Comment: last use cocaine 12-30-2020 per pt   Sexual activity: Not on file  Other Topics Concern   Not on file  Social History Narrative   Not on file   Social Determinants of Health   Financial Resource Strain: Medium Risk   Difficulty of Paying Living Expenses: Somewhat hard  Food Insecurity: No Food Insecurity   Worried About Charity fundraiser in the Last Year: Never true   Ran Out of  Food in the Last Year: Never true  Transportation Needs: No Transportation Needs   Lack of Transportation (Medical): No   Lack of Transportation (Non-Medical): No  Physical Activity: Sufficiently Active   Days of Exercise per Week: 5 days   Minutes of Exercise per Session: 30 min  Stress: Stress Concern Present   Feeling of Stress : To some extent  Social Connections: Moderately Isolated   Frequency of Communication with Friends and Family: More than three times a week   Frequency of Social Gatherings with Friends and Family: Once a week   Attends Religious Services: Never   Marine scientist or Organizations: Yes   Attends Music therapist: More than 4 times per year   Marital Status: Divorced  Intimate  Partner Violence: Not At Risk   Fear of Current or Ex-Partner: No   Emotionally Abused: No   Physically Abused: No   Sexually Abused: No    Outpatient Medications Prior to Visit  Medication Sig Dispense Refill   Albuterol Sulfate (PROAIR RESPICLICK) 123XX123 (90 Base) MCG/ACT AEPB INHALE 2 PUFFS INTO THE LUNGS EVERY 6 (SIX) HOURS AS NEEDED. 1 each 4   amLODipine (NORVASC) 10 MG tablet Take 1 tablet (10 mg total) by mouth daily. 90 tablet 3   sildenafil (VIAGRA) 100 MG tablet Take 1 tablet (100 mg total) by mouth daily as needed for erectile dysfunction. 6 tablet 1   valsartan-hydrochlorothiazide (DIOVAN-HCT) 160-25 MG tablet TAKE 1 TABLET BY MOUTH DAILY. 90 tablet 1   budesonide-formoterol (SYMBICORT) 160-4.5 MCG/ACT inhaler INHALE 2 PUFFS BY MOUTH TWICE DAILY. 10.2 g 0   No facility-administered medications prior to visit.    No Known Allergies  ROS Review of Systems  Constitutional:  Negative for chills and fever.  HENT:  Positive for trouble swallowing. Negative for sore throat and voice change.   Eyes: Negative.   Respiratory:  Positive for cough (nonproductive).   Cardiovascular: Negative.   Gastrointestinal: Negative.   Endocrine: Negative.    Genitourinary:        Erectile dysfunction  Musculoskeletal: Negative.   Skin: Negative.   Psychiatric/Behavioral: Negative.       Objective:    Physical Exam  Vitals:   06/07/21 1433 06/07/21 1439  BP: (!) 145/75 138/60  Pulse: 88   Resp: 16   Temp: 98.6 F (37 C)   TempSrc: Oral   SpO2: 96%   Weight: 170 lb (77.1 kg)     Gen: Pleasant, well-nourished, in no distress,  normal affect, hoarse   ENT: No lesions,  mouth clear,  oropharynx clear, no postnasal drip  Neck: No JVD, no TMG, no carotid bruits  Lungs: No use of accessory muscles, no dullness to percussion, clear without rales or rhonchi  Cardiovascular: RRR, heart sounds normal, no murmur or gallops, no peripheral edema  Abdomen: soft and NT, no HSM,  BS normal  Musculoskeletal: No deformities, no cyanosis or clubbing  Neuro: alert, non focal  Skin: Warm, no lesions or rashes    Wt Readings from Last 3 Encounters:  06/07/21 170 lb (77.1 kg)  06/02/21 168 lb (76.2 kg)  03/10/21 163 lb (73.9 kg)     Health Maintenance Due  Topic Date Due   Zoster Vaccines- Shingrix (1 of 2) Never done   COVID-19 Vaccine (3 - Pfizer risk series) 04/22/2020    There are no preventive care reminders to display for this patient.  No results found for: TSH Lab Results  Component Value Date   WBC 4.4 01/18/2021   HGB 16.6 01/18/2021   HCT 49.6 01/18/2021   MCV 90.0 01/18/2021   PLT 233 01/18/2021   Lab Results  Component Value Date   NA 137 01/18/2021   K 4.1 01/18/2021   CO2 26 01/18/2021   GLUCOSE 100 (H) 01/18/2021   BUN 36 (H) 01/18/2021   CREATININE 1.54 (H) 01/18/2021   BILITOT 0.6 01/18/2021   ALKPHOS 70 01/18/2021   AST 36 01/18/2021   ALT 48 (H) 01/18/2021   PROT 7.2 01/18/2021   ALBUMIN 3.9 01/18/2021   CALCIUM 8.9 01/18/2021   ANIONGAP 8 01/18/2021   No results found for: CHOL No results found for: HDL No results found for: LDLCALC No results found for: TRIG No results found for:  CHOLHDL No results found for: HGBA1C    Assessment & Plan:   Problem List Items Addressed This Visit       Cardiovascular and Mediastinum   Hypertension    Hypertension well-controlled at this time no changes made in medication         Respiratory   COPD with chronic bronchitis (HCC)    COPD stable at this time inhaled medications to continue       Relevant Medications   budesonide-formoterol (SYMBICORT) 160-4.5 MCG/ACT inhaler     Digestive   Colon cancer [facility])    Patient wishes to hold off on repeat surgical approach will observe for now       Difficulty swallowing    History of hoarseness difficulty swallowing throat exam today is unrevealing has upcoming otolaryngology appointment he is encouraged to keep this appointment         Other   History of tobacco use    Patient congratulated on having quit smoking       Meds ordered this encounter  Medications   budesonide-formoterol (SYMBICORT) 160-4.5 MCG/ACT inhaler    Sig: INHALE 2 PUFFS BY MOUTH TWICE DAILY.    Dispense:  10.2 g    Refill:  3  Pneumonia vaccination given at this visit  Follow-up: Return in about 3 months (around 09/07/2021).  Asencion Noble, MD

## 2021-06-07 ENCOUNTER — Other Ambulatory Visit: Payer: Self-pay

## 2021-06-07 ENCOUNTER — Ambulatory Visit: Payer: Medicare Other | Attending: Critical Care Medicine | Admitting: Critical Care Medicine

## 2021-06-07 ENCOUNTER — Encounter: Payer: Self-pay | Admitting: Critical Care Medicine

## 2021-06-07 DIAGNOSIS — R1310 Dysphagia, unspecified: Secondary | ICD-10-CM

## 2021-06-07 DIAGNOSIS — C187 Malignant neoplasm of sigmoid colon: Secondary | ICD-10-CM | POA: Diagnosis not present

## 2021-06-07 DIAGNOSIS — J449 Chronic obstructive pulmonary disease, unspecified: Secondary | ICD-10-CM | POA: Diagnosis not present

## 2021-06-07 DIAGNOSIS — Z87891 Personal history of nicotine dependence: Secondary | ICD-10-CM

## 2021-06-07 DIAGNOSIS — I10 Essential (primary) hypertension: Secondary | ICD-10-CM

## 2021-06-07 DIAGNOSIS — Z23 Encounter for immunization: Secondary | ICD-10-CM | POA: Diagnosis not present

## 2021-06-07 MED ORDER — BUDESONIDE-FORMOTEROL FUMARATE 160-4.5 MCG/ACT IN AERO
2.0000 | INHALATION_SPRAY | Freq: Two times a day (BID) | RESPIRATORY_TRACT | 3 refills | Status: DC
  Filled 2021-06-07: qty 10.2, 30d supply, fill #0

## 2021-06-07 NOTE — Assessment & Plan Note (Signed)
Patient wishes to hold off on repeat surgical approach will observe for now

## 2021-06-07 NOTE — Assessment & Plan Note (Signed)
History of hoarseness difficulty swallowing throat exam today is unrevealing has upcoming otolaryngology appointment he is encouraged to keep this appointment

## 2021-06-07 NOTE — Patient Instructions (Addendum)
Pneumovax given today  Nurse visit in two weeks for your first shingles vaccine will be scheduled  Keep your ENT appointment  Refills on your medications at our pharmacy.  No medication changes made  Return Dr Joya Gaskins 3 months

## 2021-06-07 NOTE — Assessment & Plan Note (Signed)
Patient congratulated on having quit smoking

## 2021-06-07 NOTE — Assessment & Plan Note (Signed)
COPD stable at this time inhaled medications to continue

## 2021-06-07 NOTE — Progress Notes (Signed)
Follow up.

## 2021-06-07 NOTE — Assessment & Plan Note (Signed)
Hypertension well-controlled at this time no changes made in medication

## 2021-06-14 ENCOUNTER — Other Ambulatory Visit: Payer: Self-pay

## 2021-06-21 ENCOUNTER — Ambulatory Visit (INDEPENDENT_AMBULATORY_CARE_PROVIDER_SITE_OTHER): Payer: Medicare Other | Admitting: Otolaryngology

## 2021-06-21 ENCOUNTER — Other Ambulatory Visit: Payer: Self-pay

## 2021-06-21 DIAGNOSIS — K219 Gastro-esophageal reflux disease without esophagitis: Secondary | ICD-10-CM

## 2021-06-21 DIAGNOSIS — R490 Dysphonia: Secondary | ICD-10-CM

## 2021-06-21 DIAGNOSIS — R1314 Dysphagia, pharyngoesophageal phase: Secondary | ICD-10-CM

## 2021-06-21 MED ORDER — OMEPRAZOLE 40 MG PO CPDR
40.0000 mg | DELAYED_RELEASE_CAPSULE | Freq: Every day | ORAL | 1 refills | Status: DC
  Filled 2021-06-21: qty 30, 30d supply, fill #0
  Filled 2021-08-23: qty 30, 30d supply, fill #1

## 2021-06-21 NOTE — Progress Notes (Signed)
HPI: Jordan [name] is a 65 y.o. male who presents is referred by Dr. Joya Gaskins for evaluation of throat complaints.  He feels like something is in his throat that sometimes blocks his swallowing he has occasional hoarseness but is really not hoarse today.  He used to smoke about a half a pack a day and quit last year. He has not been on any regular antacid medication but does take Tums 2 or 3 times a week.  Past Medical History:  Diagnosis Date   Allergy    seasonal   Aneurysm of infrarenal abdominal aorta (HCC)    3.4 cm per angela mcclung pa 04-16-2020 office note   Colon cancer ([place])    COPD (chronic obstructive pulmonary disease) ([place])    Family history of adverse reaction to anesthesia    mother slow to awaken   GERD (gastroesophageal reflux disease)    Hepatitis    hepatitis c dx in remission per pt   Hypertension    Inguinal hernia    Multiple fractures 06/04/2020   Positive colorectal cancer screening using Cologuard test 08/27/2020   Substance abuse ([place])    Wears glasses    for reading   Past Surgical History:  Procedure Laterality Date   COLONOSCOPY  11/11/2020   ENDOSCOPIC MUCOSAL RESECTION N/A 03/10/2021   Procedure: ENDOSCOPIC MUCOSAL RESECTION;  Surgeon: Irving Copas., MD;  Location: [facility];  Service: Gastroenterology;  Laterality: N/A;   FLEXIBLE SIGMOIDOSCOPY N/A 03/10/2021   Procedure: FLEXIBLE SIGMOIDOSCOPY;  Surgeon: Rush Landmark Telford Nab., MD;  Location: [facility];  Service: Gastroenterology;  Laterality: N/A;   HEMOSTASIS CLIP PLACEMENT  03/10/2021   Procedure: HEMOSTASIS CLIP PLACEMENT;  Surgeon: Irving Copas., MD;  [facility]: [facility];  Service: Gastroenterology;;   INGUINAL HERNIA REPAIR Right 01/19/2021   Procedure: LAPAROSCOPIC RIGHT INGUINAL HERNIA REPAIR WITH MESH;  Surgeon: Stechschulte, Nickola Major, MD;  Location: [place];  Service: General;  Laterality: Right;   obstruction removed as a child   swallowed a  peanut   POLYPECTOMY  03/10/2021   Procedure: POLYPECTOMY;  Surgeon: Mansouraty, Telford Nab., MD;  Location: [facility];  Service: Gastroenterology;;   Lia Foyer LIFTING INJECTION  03/10/2021   Procedure: SUBMUCOSAL LIFTING INJECTION;  Surgeon: Irving Copas., MD;  Location: [facility];  Service: Gastroenterology;;   Social History   Socioeconomic History   Marital status: Divorced    Spouse name: Not on file   Number of children: Not on file   Years of education: Not on file   Highest education level: Not on file  Occupational History   Not on file  Tobacco Use   Smoking status: Former    Types: Cigarettes    Quit date: 03/14/2020    Years since quitting: 1.2   Smokeless tobacco: Never  Vaping Use   Vaping Use: Never used  Substance and Sexual Activity   Alcohol use: Not Currently    Alcohol/week: 1.0 standard drink    Types: 1 Cans of beer per week    Comment: quit 2000   Drug use: Yes    Types: Cocaine    Comment: last use cocaine 12-30-2020 per pt   Sexual activity: Not on file  Other Topics Concern   Not on file  Social History Narrative   Not on file   Social Determinants of Health   Financial Resource Strain: Medium Risk   Difficulty of Paying Living Expenses: Somewhat hard  Food Insecurity: No Food Insecurity   Worried About  Running Out of Food in the Last Year: Never true   Ran Out of Food in the Last Year: Never true  Transportation Needs: No Transportation Needs   Lack of Transportation (Medical): No   Lack of Transportation (Non-Medical): No  Physical Activity: Sufficiently Active   Days of Exercise per Week: 5 days   Minutes of Exercise per Session: 30 min  Stress: Stress Concern Present   Feeling of Stress : To some extent  Social Connections: Moderately Isolated   Frequency of Communication with Friends and Family: More than three times a week   Frequency of Social Gatherings with Friends and Family: Once a week   Attends Religious  Services: Never   Marine scientist or Organizations: Yes   Attends Music therapist: More than 4 times per year   Marital Status: Divorced   Family History  Problem Relation Age of Onset   Cancer Sister    Colon polyps Sister    Colon polyps Father    Stomach cancer Father 42   Colon polyps Brother    Colon cancer Neg Hx    Esophageal cancer Neg Hx    Rectal cancer Neg Hx    Inflammatory bowel disease Neg Hx    Liver disease Neg Hx    Pancreatic cancer Neg Hx    No Known Allergies Prior to Admission medications   Medication Sig Start Date End Date Taking? Authorizing Provider  Albuterol Sulfate (PROAIR RESPICLICK) 123XX123 (90 Base) MCG/ACT AEPB INHALE 2 PUFFS INTO THE LUNGS EVERY 6 (SIX) HOURS AS NEEDED. 05/11/21 05/11/22  Elsie Stain, MD  amLODipine (NORVASC) 10 MG tablet Take 1 tablet (10 mg total) by mouth daily. 05/11/21 05/11/22  Elsie Stain, MD  budesonide-formoterol (SYMBICORT) 160-4.5 MCG/ACT inhaler INHALE 2 PUFFS BY MOUTH TWICE DAILY. 06/07/21 06/07/22  Elsie Stain, MD  sildenafil (VIAGRA) 100 MG tablet Take 1 tablet (100 mg total) by mouth daily as needed for erectile dysfunction. 05/11/21 05/11/22  Elsie Stain, MD  valsartan-hydrochlorothiazide (DIOVAN-HCT) 160-25 MG tablet TAKE 1 TABLET BY MOUTH DAILY. 05/11/21 05/11/22  Elsie Stain, MD     Positive ROS: Otherwise negative  All other systems have been reviewed and were otherwise negative with the exception of those mentioned in the HPI and as above.  Physical Exam: Constitutional: Alert, well-appearing, no acute distress.  No significant hoarseness on conversation in the office today. Ears: External ears without lesions or tenderness. Ear canals are clear bilaterally with intact, clear TMs.  Nasal: External nose without lesions. Septum with minimal deformity.  No polyps noted.. Clear nasal passages otherwise. Oral: Lips and gums without lesions. Tongue and palate mucosa without  lesions. Posterior oropharynx clear.  Indirect laryngoscopy revealed a clear base of tongue vallecula and epiglottis.  Posterior vocal cords were clear with normal vocal mobility.  Piriform sinuses were clear. Fiberoptic laryngoscopy was performed through the right nostril.  The nasopharynx was clear.  The base of tongue vallecula and epiglottis were normal.  Vocal cords were clear bilaterally with normal vocal mobility.  Piriform sinuses were clear.  Minimal edema of the arytenoid mucosa but no mucosal abnormalities noted. Neck: No palpable adenopathy or masses.  No supraclavicular adenopathy.  Noted adenopathy noted on either side of the neck.  No thyroid nodules noted. Respiratory: Breathing comfortably  Skin: No facial/neck lesions or rash noted.  Laryngoscopy  Date/Time: 06/21/2021 3:06 PM Performed by: Rozetta Nunnery, MD Authorized by: Rozetta Nunnery, MD   Consent:  Consent obtained:  Verbal   Consent given by:  Patient Procedure details:    Indications: hoarseness, dysphagia, or aspiration     Medication:  Afrin   Instrument: flexible fiberoptic laryngoscope     Scope location: right nare   Sinus:    Right nasopharynx: normal   Mouth:    Oropharynx: normal     Vallecula: normal     Base of tongue: normal     Epiglottis: normal   Throat:    Pyriform sinus: normal     True vocal cords: normal   Comments:     On fiberoptic laryngoscopy the upper airway was clear to examination with no mucosal abnormalities noted.  No polyps or vocal cord lesions noted.  Assessment: Dysphagia and throat complaints I suspect are probably related to GE reflux disease.  Plan: Suggested taking omeprazole 40 mg daily before dinner for the next 6 weeks to see if this helps with the symptoms.   Radene Journey, MD   CC:

## 2021-06-22 ENCOUNTER — Ambulatory Visit: Payer: Medicare Other | Attending: Critical Care Medicine

## 2021-06-22 DIAGNOSIS — Z23 Encounter for immunization: Secondary | ICD-10-CM

## 2021-06-23 ENCOUNTER — Other Ambulatory Visit: Payer: Self-pay

## 2021-07-05 ENCOUNTER — Other Ambulatory Visit: Payer: Self-pay
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
## 2021-08-02 IMAGING — DX DG CHEST 1V PORT
1 series · 1 of 1 positions shown · non-contrast
Comparison: Chest CT from yesterday

CLINICAL DATA: Rib fractures.

EXAM:
PORTABLE CHEST 1 VIEW

[chest]
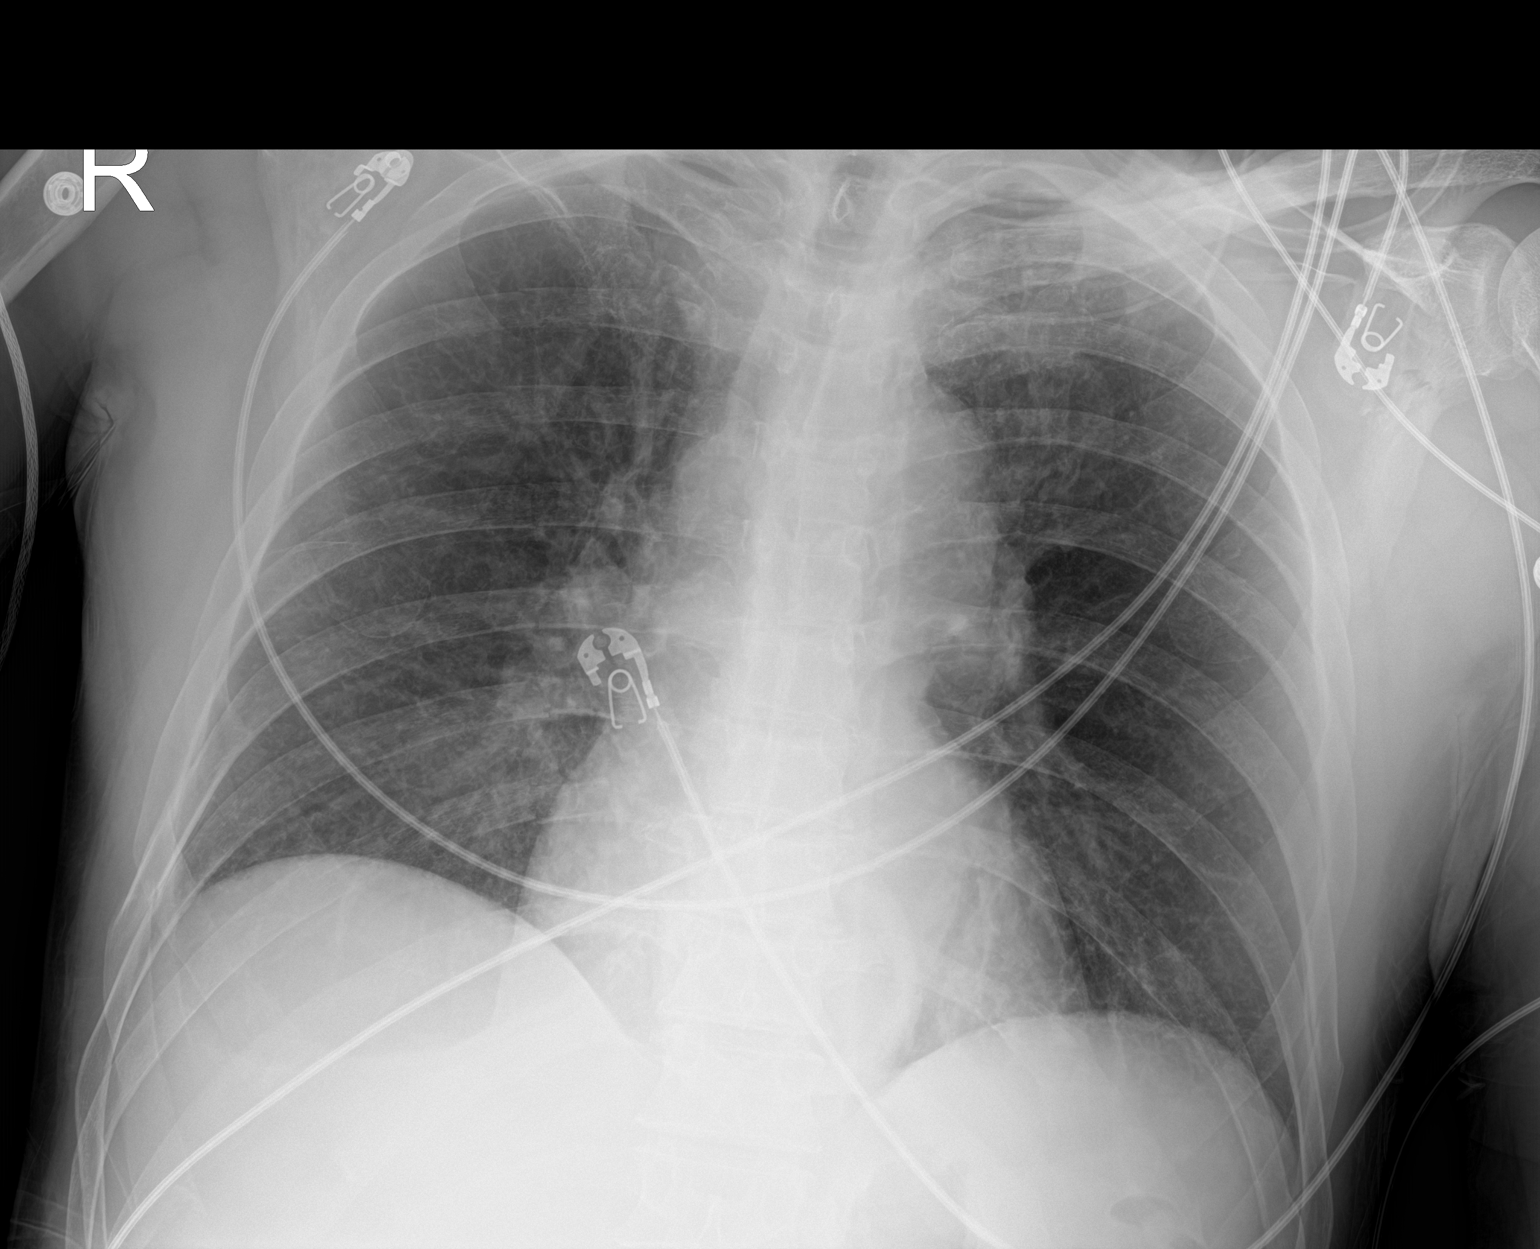

[1 of 1 positions shown; findings below may reference images not displayed]

FINDINGS: Normal heart size and mediastinal contours. Curvilinear structure
along the left descending aorta is presumably atelectasis when
compared with recent chest CT. Rib fractures are subtle by
radiography. No visible effusion or pneumothorax.
IMPRESSION: Mild atelectatic change.  No visible air leak.

## 2021-08-23 ENCOUNTER — Other Ambulatory Visit: Payer: Self-pay

## 2021-08-25 ENCOUNTER — Other Ambulatory Visit: Payer: Self-pay

## 2021-09-06 NOTE — Progress Notes (Signed)
Established Patient Office Visit  Subjective:  Patient ID: Jordan [name], male    DOB: 10/21/1956  Age: 65 y.o. MRN: [id number]  CC:  Chief Complaint  Patient presents with   Fatigue   Hypertension    HPI [name] presents for primary care follow-up Patient with history of hypertension and colon cancer.  Patient would like to get back to gastroenterology to see what the status of his colon cancer is.  He was offered a surgical approach but refused this earlier this year.  Patient also has several carious teeth that need to be removed.  He does have a dentist he will be contacting.  On arrival blood pressure elevated 148/92.  He has been but not necessarily compliant with his diet with low salt and he missed his blood pressure medicines this morning.  No other complaints at this visit.  Patient agreed to receive his second zoster vaccine at this visit.  We will hold off flu shot for at least a week he will come in for that vaccination at a later date.  Patient is out of his Symbicort and has been wheezing more and been more short of breath  Past Medical History:  Diagnosis Date   Allergy    seasonal   Aneurysm of infrarenal abdominal aorta    3.4 cm per angela mcclung pa 04-16-2020 office note   Colon cancer [facility])    COPD (chronic obstructive pulmonary disease) ([place])    Family history of adverse reaction to anesthesia    mother slow to awaken   GERD (gastroesophageal reflux disease)    Hepatitis    hepatitis c dx in remission per pt   Hypertension    Inguinal hernia    Multiple fractures 06/04/2020   Positive colorectal cancer screening using Cologuard test 08/27/2020   Substance abuse ([place])    Wears glasses    for reading    Past Surgical History:  Procedure Laterality Date   COLONOSCOPY  11/11/2020   ENDOSCOPIC MUCOSAL RESECTION N/A 03/10/2021   Procedure: ENDOSCOPIC MUCOSAL RESECTION;  Surgeon: Irving Copas., MD;  Location: [facility];  Service:  Gastroenterology;  Laterality: N/A;   FLEXIBLE SIGMOIDOSCOPY N/A 03/10/2021   Procedure: FLEXIBLE SIGMOIDOSCOPY;  Surgeon: Rush Landmark Telford Nab., MD;  Location: [facility];  Service: Gastroenterology;  Laterality: N/A;   HEMOSTASIS CLIP PLACEMENT  03/10/2021   Procedure: HEMOSTASIS CLIP PLACEMENT;  Surgeon: Irving Copas., MD;  Location: [facility];  Service: Gastroenterology;;   INGUINAL HERNIA REPAIR Right 01/19/2021   Procedure: LAPAROSCOPIC RIGHT INGUINAL HERNIA REPAIR WITH MESH;  Surgeon: Stechschulte, Nickola Major, MD;  Location: [facility];  Service: General;  Laterality: Right;   obstruction removed as a child   swallowed a peanut   POLYPECTOMY  03/10/2021   Procedure: POLYPECTOMY;  Surgeon: Mansouraty, Telford Nab., MD;  Location: [facility];  Service: Gastroenterology;;   Lia Foyer LIFTING INJECTION  03/10/2021   Procedure: SUBMUCOSAL LIFTING INJECTION;  Surgeon: Irving Copas., MD;  Location: [facility];  Service: Gastroenterology;;    Family History  Problem Relation Age of Onset   Cancer Sister    Colon polyps Sister    Colon polyps Father    Stomach cancer Father 75   Colon polyps Brother    Colon cancer Neg Hx    Esophageal cancer Neg Hx    Rectal cancer Neg Hx    Inflammatory bowel disease Neg Hx    Liver disease Neg Hx    Pancreatic cancer  Neg Hx     Social History   Socioeconomic History   Marital status: Divorced    Spouse name: Not on file   Number of children: Not on file   Years of education: Not on file   Highest education level: Not on file  Occupational History   Not on file  Tobacco Use   Smoking status: Former    Types: Cigarettes    Quit date: 03/14/2020    Years since quitting: 1.4   Smokeless tobacco: Never  Vaping Use   Vaping Use: Never used  Substance and Sexual Activity   Alcohol use: Not Currently    Alcohol/week: 1.0 standard drink    Types: 1 Cans of beer per week    Comment: quit 2000   Drug use:  Yes    Types: Cocaine    Comment: last use cocaine 12-30-2020 per pt   Sexual activity: Not on file  Other Topics Concern   Not on file  Social History Narrative   Not on file   Social Determinants of Health   Financial Resource Strain: Medium Risk   Difficulty of Paying Living Expenses: Somewhat hard  Food Insecurity: No Food Insecurity   Worried About Charity fundraiser in the Last Year: Never true   Ran Out of Food in the Last Year: Never true  Transportation Needs: No Transportation Needs   Lack of Transportation (Medical): No   Lack of Transportation (Non-Medical): No  Physical Activity: Sufficiently Active   Days of Exercise per Week: 5 days   Minutes of Exercise per Session: 30 min  Stress: Stress Concern Present   Feeling of Stress : To some extent  Social Connections: Moderately Isolated   Frequency of Communication with Friends and Family: More than three times a week   Frequency of Social Gatherings with Friends and Family: Once a week   Attends Religious Services: Never   Marine scientist or Organizations: Yes   Attends Music therapist: More than 4 times per year   Marital Status: Divorced  Human resources officer Violence: Not At Risk   Fear of Current or Ex-Partner: No   Emotionally Abused: No   Physically Abused: No   Sexually Abused: No    Outpatient Medications Prior to Visit  Medication Sig Dispense Refill   Albuterol Sulfate (PROAIR RESPICLICK) 258 (90 Base) MCG/ACT AEPB INHALE 2 PUFFS INTO THE LUNGS EVERY 6 (SIX) HOURS AS NEEDED. 1 each 4   amLODipine (NORVASC) 10 MG tablet Take 1 tablet (10 mg total) by mouth daily. 90 tablet 3   omeprazole (PRILOSEC) 40 MG capsule Take 1 capsule (40 mg total) by mouth daily before dinner. 30 capsule 1   sildenafil (VIAGRA) 100 MG tablet Take 1 tablet (100 mg total) by mouth daily as needed for erectile dysfunction. 6 tablet 1   valsartan-hydrochlorothiazide (DIOVAN-HCT) 160-25 MG tablet TAKE 1 TABLET BY  MOUTH DAILY. 90 tablet 1   budesonide-formoterol (SYMBICORT) 160-4.5 MCG/ACT inhaler INHALE 2 PUFFS BY MOUTH TWICE DAILY. (Patient not taking: Reported on 09/07/2021) 10.2 g 3   No facility-administered medications prior to visit.    No Known Allergies  ROS Review of Systems  Constitutional: Negative.  Negative for chills, diaphoresis and fever.  HENT:  Positive for trouble swallowing. Negative for congestion, ear pain, hearing loss, nosebleeds, postnasal drip, rhinorrhea, sinus pressure, sore throat, tinnitus and voice change.   Eyes: Negative.  Negative for photophobia and redness.  Respiratory:  Positive for shortness of breath  and wheezing. Negative for apnea, cough, choking, chest tightness and stridor.   Cardiovascular: Negative.  Negative for chest pain, palpitations and leg swelling.  Gastrointestinal:  Positive for abdominal pain. Negative for abdominal distention, anal bleeding, blood in stool, constipation, diarrhea, nausea and vomiting.       RLQ  Endocrine: Negative for polydipsia.  Genitourinary: Negative.  Negative for dysuria, flank pain, frequency, hematuria and urgency.  Musculoskeletal: Negative.  Negative for arthralgias, back pain, myalgias and neck pain.  Skin: Negative.  Negative for rash.  Allergic/Immunologic: Negative.  Negative for environmental allergies and food allergies.  Neurological: Negative.  Negative for dizziness, tremors, seizures, syncope, weakness and headaches.  Hematological:  Positive for adenopathy. Does not bruise/bleed easily.  Psychiatric/Behavioral:  Positive for confusion. Negative for agitation, dysphoric mood, hallucinations, self-injury, sleep disturbance and suicidal ideas. The patient is not nervous/anxious and is not hyperactive.      Objective:    Physical Exam Vitals reviewed.  Constitutional:      Appearance: Normal appearance. He is well-developed. He is not diaphoretic.  HENT:     Head: Normocephalic and atraumatic.      Nose: No nasal deformity, septal deviation, mucosal edema or rhinorrhea.     Right Sinus: No maxillary sinus tenderness or frontal sinus tenderness.     Left Sinus: No maxillary sinus tenderness or frontal sinus tenderness.     Mouth/Throat:     Pharynx: No oropharyngeal exudate.  Eyes:     General: No scleral icterus.    Conjunctiva/sclera: Conjunctivae normal.     Pupils: Pupils are equal, round, and reactive to light.  Neck:     Thyroid: No thyromegaly.     Vascular: No carotid bruit or JVD.     Trachea: Trachea normal. No tracheal tenderness or tracheal deviation.  Cardiovascular:     Rate and Rhythm: Normal rate and regular rhythm.     Chest Wall: PMI is not displaced.     Pulses: Normal pulses. No decreased pulses.     Heart sounds: Normal heart sounds, S1 normal and S2 normal. Heart sounds not distant. No murmur heard. No systolic murmur is present.  No diastolic murmur is present.    No friction rub. No gallop. No S3 or S4 sounds.  Pulmonary:     Effort: Pulmonary effort is normal. No tachypnea, accessory muscle usage or respiratory distress.     Breath sounds: No stridor. Wheezing and rhonchi present. No decreased breath sounds or rales.  Chest:     Chest wall: No tenderness.  Abdominal:     General: Bowel sounds are normal. There is no distension.     Palpations: Abdomen is soft. Abdomen is not rigid.     Tenderness: There is no abdominal tenderness. There is no guarding or rebound.  Musculoskeletal:        General: Normal range of motion.     Cervical back: Normal range of motion and neck supple. No edema, erythema or rigidity. No muscular tenderness. Normal range of motion.  Lymphadenopathy:     Head:     Right side of head: No submental or submandibular adenopathy.     Left side of head: No submental or submandibular adenopathy.     Cervical: No cervical adenopathy.  Skin:    General: Skin is warm and dry.     Coloration: Skin is not pale.     Findings: No rash.      Nails: There is no clubbing.  Neurological:  Mental Status: He is alert and oriented to person, place, and time.     Sensory: No sensory deficit.  Psychiatric:        Mood and Affect: Mood normal.        Speech: Speech normal.        Behavior: Behavior normal.    BP (!) 145/88   Pulse 75   Resp 16   Wt 171 lb 6.4 oz (77.7 kg)   SpO2 95%   BMI 23.91 kg/m  Wt Readings from Last 3 Encounters:  09/07/21 171 lb 6.4 oz (77.7 kg)  06/07/21 170 lb (77.1 kg)  06/02/21 168 lb (76.2 kg)     Health Maintenance Due  Topic Date Due   COVID-19 Vaccine (3 - Pfizer risk series) 04/22/2020   INFLUENZA VACCINE  Never done    There are no preventive care reminders to display for this patient.  No results found for: TSH Lab Results  Component Value Date   WBC 4.4 01/18/2021   HGB 16.6 01/18/2021   HCT 49.6 01/18/2021   MCV 90.0 01/18/2021   PLT 233 01/18/2021   Lab Results  Component Value Date   NA 137 01/18/2021   K 4.1 01/18/2021   CO2 26 01/18/2021   GLUCOSE 100 (H) 01/18/2021   BUN 36 (H) 01/18/2021   CREATININE 1.54 (H) 01/18/2021   BILITOT 0.6 01/18/2021   ALKPHOS 70 01/18/2021   AST 36 01/18/2021   ALT 48 (H) 01/18/2021   PROT 7.2 01/18/2021   ALBUMIN 3.9 01/18/2021   CALCIUM 8.9 01/18/2021   ANIONGAP 8 01/18/2021   No results found for: CHOL No results found for: HDL No results found for: LDLCALC No results found for: TRIG No results found for: CHOLHDL No results found for: HGBA1C    Assessment & Plan:   Problem List Items Addressed This Visit       Cardiovascular and Mediastinum   Hypertension    Hypertension not well controlled he is not been compliant with medications we will refill his medications and he will follow a low-salt diet we gave him a diet      Relevant Medications   amLODipine (NORVASC) 10 MG tablet   sildenafil (VIAGRA) 100 MG tablet   valsartan-hydrochlorothiazide (DIOVAN-HCT) 160-25 MG tablet     Respiratory   COPD with  chronic bronchitis (H[facility])    COPD with chronic bronchitis plan will be for this patient to refill Symbicort      Relevant Medications   budesonide-formoterol (SYMBICORT) 160-4.5 MCG/ACT inhaler   Albuterol Sulfate (PROAIR RESPICLICK) 884 (90 Base) MCG/ACT AEPB     Digestive   Colon cancer [facility])    Patient did declined to have his cancer resected earlier in the year he would like to see gastroenterology again for a sooner appointment this referral was made      Adenocarcinoma of colon ([place]) - Primary    As per colon cancer assessment      Relevant Orders   Ambulatory referral to Gastroenterology     Other   Erectile dysfunction    Still having difficulty we will continue Viagra       Meds ordered this encounter  Medications   Zoster Vaccine Adjuvanted [facility]) injection    Sig: Inject 0.5 mLs into the muscle once for 1 dose.    Dispense:  1 each    Refill:  0   amLODipine (NORVASC) 10 MG tablet    Sig: Take 1 tablet (10 mg total) by  mouth daily.    Dispense:  90 tablet    Refill:  3   budesonide-formoterol (SYMBICORT) 160-4.5 MCG/ACT inhaler    Sig: INHALE 2 PUFFS BY MOUTH TWICE DAILY.    Dispense:  10.2 g    Refill:  3   omeprazole (PRILOSEC) 40 MG capsule    Sig: Take 1 capsule (40 mg total) by mouth daily before dinner.    Dispense:  30 capsule    Refill:  1   sildenafil (VIAGRA) 100 MG tablet    Sig: Take 1 tablet (100 mg total) by mouth daily as needed for erectile dysfunction.    Dispense:  6 tablet    Refill:  1   valsartan-hydrochlorothiazide (DIOVAN-HCT) 160-25 MG tablet    Sig: TAKE 1 TABLET BY MOUTH DAILY.    Dispense:  90 tablet    Refill:  1   Albuterol Sulfate (PROAIR RESPICLICK) 321 (90 Base) MCG/ACT AEPB    Sig: INHALE 2 PUFFS INTO THE LUNGS EVERY 6 (SIX) HOURS AS NEEDED.    Dispense:  1 each    Refill:  4    Follow-up: No follow-ups on file.    Asencion Noble, MD

## 2021-09-07 ENCOUNTER — Other Ambulatory Visit: Payer: Self-pay

## 2021-09-07 ENCOUNTER — Encounter: Payer: Self-pay | Admitting: Critical Care Medicine

## 2021-09-07 ENCOUNTER — Ambulatory Visit: Payer: Medicare Other | Attending: Critical Care Medicine | Admitting: Critical Care Medicine

## 2021-09-07 VITALS — BP 145/88 | HR 75 | Resp 16 | Wt 171.4 lb

## 2021-09-07 DIAGNOSIS — N529 Male erectile dysfunction, unspecified: Secondary | ICD-10-CM

## 2021-09-07 DIAGNOSIS — I10 Essential (primary) hypertension: Secondary | ICD-10-CM

## 2021-09-07 DIAGNOSIS — C189 Malignant neoplasm of colon, unspecified: Secondary | ICD-10-CM | POA: Diagnosis not present

## 2021-09-07 DIAGNOSIS — J449 Chronic obstructive pulmonary disease, unspecified: Secondary | ICD-10-CM | POA: Diagnosis not present

## 2021-09-07 DIAGNOSIS — C187 Malignant neoplasm of sigmoid colon: Secondary | ICD-10-CM | POA: Diagnosis not present

## 2021-09-07 MED ORDER — OMEPRAZOLE 40 MG PO CPDR
40.0000 mg | DELAYED_RELEASE_CAPSULE | Freq: Every day | ORAL | 1 refills | Status: DC
  Filled 2021-09-07 – 2021-12-23 (×3): qty 30, 30d supply, fill #0
  Filled 2021-12-23: qty 30, 30d supply, fill #1

## 2021-09-07 MED ORDER — AMLODIPINE BESYLATE 10 MG PO TABS
10.0000 mg | ORAL_TABLET | Freq: Every day | ORAL | 3 refills | Status: DC
  Filled 2021-09-07: qty 90, 90d supply, fill #0
  Filled 2021-09-24: qty 30, 30d supply, fill #0
  Filled 2021-11-09: qty 30, 30d supply, fill #1
  Filled 2021-12-23: qty 30, 30d supply, fill #0
  Filled 2021-12-23: qty 30, 30d supply, fill #2

## 2021-09-07 MED ORDER — VALSARTAN-HYDROCHLOROTHIAZIDE 160-25 MG PO TABS
1.0000 | ORAL_TABLET | Freq: Every day | ORAL | 1 refills | Status: DC
  Filled 2021-09-07: qty 90, 90d supply, fill #0
  Filled 2021-09-24: qty 30, 30d supply, fill #0
  Filled 2021-11-09: qty 30, 30d supply, fill #1
  Filled 2021-12-23: qty 30, 30d supply, fill #0
  Filled 2021-12-23: qty 30, 30d supply, fill #2

## 2021-09-07 MED ORDER — SILDENAFIL CITRATE 100 MG PO TABS
100.0000 mg | ORAL_TABLET | Freq: Every day | ORAL | 1 refills | Status: DC | PRN
  Filled 2021-09-07: qty 10, 30d supply, fill #0

## 2021-09-07 MED ORDER — ZOSTER VAC RECOMB ADJUVANTED 50 MCG/0.5ML IM SUSR
0.5000 mL | Freq: Once | INTRAMUSCULAR | 0 refills | Status: AC
  Filled 2021-09-07: qty 1, 1d supply, fill #0

## 2021-09-07 MED ORDER — BUDESONIDE-FORMOTEROL FUMARATE 160-4.5 MCG/ACT IN AERO
2.0000 | INHALATION_SPRAY | Freq: Two times a day (BID) | RESPIRATORY_TRACT | 3 refills | Status: DC
  Filled 2021-09-07: qty 10.2, 30d supply, fill #0
  Filled 2021-11-09: qty 10.2, 30d supply, fill #1

## 2021-09-07 MED ORDER — ALBUTEROL SULFATE 108 (90 BASE) MCG/ACT IN AEPB
INHALATION_SPRAY | RESPIRATORY_TRACT | 4 refills | Status: DC
  Filled 2021-09-07: qty 1, 25d supply, fill #0

## 2021-09-07 NOTE — Assessment & Plan Note (Signed)
Hypertension not well controlled he is not been compliant with medications we will refill his medications and he will follow a low-salt diet we gave him a diet

## 2021-09-07 NOTE — Assessment & Plan Note (Signed)
Still having difficulty we will continue Viagra

## 2021-09-07 NOTE — Assessment & Plan Note (Signed)
Patient did declined to have his cancer resected earlier in the year he would like to see gastroenterology again for a sooner appointment this referral was made

## 2021-09-07 NOTE — Patient Instructions (Addendum)
We recommend a flu vaccine you can receive this at any outside pharmacy your insurance should cover this I would wait about 2 weeks after your shingles vaccine which she finished today  Recommend also a COVID-vaccine I also recommend waiting and not doing that at the same time as this flu vaccine at a later date this would be your booster shot  Referral back to gastroenterology will be made to follow-up on your colon cancer  Please work on getting the final teeth pulled that are in need of being removed  Follow a healthy diet watch your salt intake and stay on your blood pressure medicines  Refills on your Symbicort and all your other medications were made  Return to see Dr. Joya Gaskins 4 months

## 2021-09-07 NOTE — Assessment & Plan Note (Signed)
As per colon cancer assessment

## 2021-09-07 NOTE — Assessment & Plan Note (Addendum)
COPD with chronic bronchitis plan will be for this patient to refill Symbicort

## 2021-09-08 ENCOUNTER — Telehealth: Payer: Self-pay

## 2021-09-08 NOTE — Telephone Encounter (Signed)
Pt was scheduled for a previsit for 10/25/2021 @ 1:00 Pt scheduled for an Colonoscopy with Dr. Carlean Purl 10/29/2021 @ 11:00 Pt made aware Pt verbalized understanding with all questions answered.

## 2021-09-08 NOTE — Telephone Encounter (Signed)
-----   Message from Gatha Mayer, MD sent at 09/08/2021 12:58 PM EDT ----- Jordan [name]  Please arrange for [name] to have a colonoscopy (direct)  Dx hx colon cancer, hx polyps  Thanks  CEG ----- Message ----- From: Elsie Stain, MD Sent: 09/07/2021   5:59 PM EDT To: Gatha Mayer, MD, Irving Copas., MD  Thank you both! ----- Message ----- From: Irving Copas., MD Sent: 09/07/2021   5:34 PM EDT To: Gatha Mayer, MD, Elsie Stain, MD  PW, Thank you for reaching out. I do remember this patient. Since he did not want to undergo surgical evaluation or undergo surgery, I think high risk endoscopic evaluation is reasonable if he continues to do well. Dr. Carlean Purl did his first colonoscopy and I was available for the follow-up attempt at the potential full-thickness resection which led to Banding. Dr. Carlean Purl and I we will talk about which of [place] will be able to pursue his colonoscopy/flex sig and we will get him scheduled. CG, when you have a chance to review your initial colonoscopy lets touch base. Thanks. GM ----- Message ----- From: Elsie Stain, MD Sent: 09/07/2021   5:28 PM EDT To: Irving Copas., MD  Hello Dr Rush Landmark  This pt is interested to see if [name] has progressed.  Recall he refused to accept tumor board recommendation.  I sent another referral over

## 2021-09-09 NOTE — Telephone Encounter (Signed)
-----   Message from Gatha Mayer, MD sent at 09/08/2021 12:58 PM EDT ----- Jordan [name]  Please arrange for Jordan [name] to have a colonoscopy (direct)  Dx hx colon cancer, hx polyps  Thanks  CEG ----- Message ----- From: Elsie Stain, MD Sent: 09/07/2021   5:59 PM EDT To: Gatha Mayer, MD, Irving Copas., MD  Thank you both! ----- Message ----- From: Irving Copas., MD Sent: 09/07/2021   5:34 PM EDT To: Gatha Mayer, MD, Elsie Stain, MD  PW, Thank you for reaching out. I do remember this patient. Since he did not want to undergo surgical evaluation or undergo surgery, I think high risk endoscopic evaluation is reasonable if he continues to do well. Dr. Carlean Purl did his first colonoscopy and I was available for the follow-up attempt at the potential full-thickness resection which led to Banding. Dr. Carlean Purl and I we will talk about which of [place] will be able to pursue his colonoscopy/flex sig and we will get him scheduled. CG, when you have a chance to review your initial colonoscopy lets touch base. Thanks. GM ----- Message ----- From: Elsie Stain, MD Sent: 09/07/2021   5:28 PM EDT To: Irving Copas., MD  Hello Dr Rush Landmark  This pt is interested to see if [name] has progressed.  Recall he refused to accept tumor board recommendation.  I sent another referral over

## 2021-09-24 ENCOUNTER — Other Ambulatory Visit: Payer: Self-pay

## 2021-10-12 ENCOUNTER — Other Ambulatory Visit (HOSPITAL_COMMUNITY): Payer: Self-pay

## 2021-10-25 ENCOUNTER — Encounter: Payer: Self-pay | Admitting: Internal Medicine

## 2021-10-25 ENCOUNTER — Other Ambulatory Visit: Payer: Self-pay

## 2021-10-25 ENCOUNTER — Ambulatory Visit (AMBULATORY_SURGERY_CENTER): Payer: Medicare Other | Admitting: *Deleted

## 2021-10-25 VITALS — Ht 72.0 in | Wt 171.0 lb

## 2021-10-25 DIAGNOSIS — Z85038 Personal history of other malignant neoplasm of large intestine: Secondary | ICD-10-CM

## 2021-10-25 DIAGNOSIS — Z86010 Personal history of colonic polyps: Secondary | ICD-10-CM

## 2021-10-25 MED ORDER — BISACODYL EC 5 MG PO TBEC
5.0000 mg | DELAYED_RELEASE_TABLET | Freq: Once | ORAL | 0 refills | Status: AC
  Filled 2021-10-25: qty 4, 4d supply, fill #0

## 2021-10-25 MED ORDER — PEG 3350-KCL-NA BICARB-NACL 420 G PO SOLR
4000.0000 mL | Freq: Once | ORAL | 0 refills | Status: AC
  Filled 2021-10-25: qty 4000, 1d supply, fill #0

## 2021-10-25 NOTE — Progress Notes (Signed)
Patient's pre-visit was done today over the phone with the patient. Name,DOB and address verified. Patient denies any allergies to Eggs and Soy. Patient denies any problems with anesthesia/sedation. Patient is not taking any diet pills or blood thinners. No home Oxygen. Packet of Prep instructions mailed to patient including a copy of a consent form-pt is aware. Prep instructions sent to pt's MyChart (if activated).Patient understands to call us back with any questions or concerns. Patient is aware of our care-partner policy and [id number]-[id number] safety protocol.The patient is COVID-19 vaccinated.  Patient aware not to use cocaine 48 hours prior to procedure. Pt request cheap prep-I used [employer] pt will pick up instructions today from 2nd floor.

## 2021-10-26 ENCOUNTER — Other Ambulatory Visit: Payer: Self-pay

## 2021-10-29 ENCOUNTER — Other Ambulatory Visit: Payer: Self-pay

## 2021-10-29 ENCOUNTER — Ambulatory Visit (AMBULATORY_SURGERY_CENTER): Payer: Medicare Other | Admitting: Internal Medicine

## 2021-10-29 ENCOUNTER — Encounter: Payer: Self-pay | Admitting: Internal Medicine

## 2021-10-29 VITALS — BP 174/95 | HR 67 | Temp 97.3°F | Resp 13 | Ht 72.0 in | Wt 171.0 lb

## 2021-10-29 DIAGNOSIS — Z86010 Personal history of colonic polyps: Secondary | ICD-10-CM | POA: Diagnosis not present

## 2021-10-29 DIAGNOSIS — D120 Benign neoplasm of cecum: Secondary | ICD-10-CM

## 2021-10-29 DIAGNOSIS — Z85038 Personal history of other malignant neoplasm of large intestine: Secondary | ICD-10-CM | POA: Diagnosis not present

## 2021-10-29 DIAGNOSIS — D124 Benign neoplasm of descending colon: Secondary | ICD-10-CM

## 2021-10-29 MED ORDER — SODIUM CHLORIDE 0.9 % IV SOLN: 500.0000 mL | Freq: Once | INTRAVENOUS | Status: DC

## 2021-10-29 NOTE — Patient Instructions (Addendum)
I did not see any signs of cancer where it was removed but took biopsies to be sure.  I did find 4 other polyps.  I will let you know pathology results and when to have another routine colonoscopy by mail and/or My Chart.  Since you seem to keep growing polyps it may be sooner than 3 years.  I appreciate the opportunity to care for you. Gatha Mayer, MD, FACG  YOU HAD AN ENDOSCOPIC PROCEDURE TODAY: Refer to the procedure report and other information in the discharge instructions given to you for any specific questions about what was found during the examination. If this information does not answer your questions, please call [place] office at [phone] to clarify.   YOU SHOULD EXPECT: Some feelings of bloating in the abdomen. Passage of more gas than usual. Walking can help get rid of the air that was put into your GI tract during the procedure and reduce the bloating. If you had a lower endoscopy (such as a colonoscopy or flexible sigmoidoscopy) you may notice spotting of blood in your stool or on the toilet paper. Some abdominal soreness may be present for a day or two, also.  DIET: Your first meal following the procedure should be a light meal and then it is ok to progress to your normal diet. A half-sandwich or bowl of soup is an example of a good first meal. Heavy or fried foods are harder to digest and may make you feel nauseous or bloated. Drink plenty of fluids but you should avoid alcoholic beverages for 24 hours. If you had a esophageal dilation, please see attached instructions for diet.    ACTIVITY: Your care partner should take you home directly after the procedure. You should plan to take it easy, moving slowly for the rest of the day. You can resume normal activity the day after the procedure however YOU SHOULD NOT DRIVE, use power tools, machinery or perform tasks that involve climbing or major physical exertion for 24 hours (because of the sedation medicines used during the  test).   SYMPTOMS TO REPORT IMMEDIATELY: A gastroenterologist can be reached at any hour. Please call [phone]  for any of the following symptoms:  Following lower endoscopy (colonoscopy, flexible sigmoidoscopy) Excessive amounts of blood in the stool  Significant tenderness, worsening of abdominal pains  Swelling of the abdomen that is new, acute  Fever of 100 or higher  FOLLOW UP:  If any biopsies were taken you will be contacted by phone or by letter within the next 1-3 weeks. Call [phone]  if you have not heard about the biopsies in 3 weeks.  Please also call with any specific questions about appointments or follow up tests.

## 2021-10-29 NOTE — Progress Notes (Signed)
Report given to PACU, vss 

## 2021-10-29 NOTE — Progress Notes (Signed)
[facility] History and Physical   Primary Care Physician:  Elsie Stain, MD   Reason for Procedure:   Colon cancer surveillance  Plan:    colonoscopy     HPI: Jordan [name] is a 65 y.o. male w/ hx of colonoscopy removal of a colon cancer 1 year ago - here for surveillance exam - he went on and had a cap band resection of the original site as opposed to surgical resection. Those revealed hyperplastic polyp changes.   Past Medical History:  Diagnosis Date   Allergy    seasonal   Aneurysm of infrarenal abdominal aorta    3.4 cm per angela mcclung pa 04-16-2020 office note   Colon cancer [facility])    COPD (chronic obstructive pulmonary disease) ([place])    Family history of adverse reaction to anesthesia    mother slow to awaken   GERD (gastroesophageal reflux disease)    Hepatitis    hepatitis c dx in remission per pt   Hypertension    Inguinal hernia    Multiple fractures 06/04/2020   Positive colorectal cancer screening using Cologuard test 08/27/2020   Substance abuse ([place])    Wears glasses    for reading    Past Surgical History:  Procedure Laterality Date   COLONOSCOPY  11/11/2020   ENDOSCOPIC MUCOSAL RESECTION N/A 03/10/2021   Procedure: ENDOSCOPIC MUCOSAL RESECTION;  Surgeon: Irving Copas., MD;  Location: [facility];  Service: Gastroenterology;  Laterality: N/A;   FLEXIBLE SIGMOIDOSCOPY N/A 03/10/2021   Procedure: FLEXIBLE SIGMOIDOSCOPY;  Surgeon: Rush Landmark Telford Nab., MD;  Location: [facility];  Service: Gastroenterology;  Laterality: N/A;   HEMOSTASIS CLIP PLACEMENT  03/10/2021   Procedure: HEMOSTASIS CLIP PLACEMENT;  Surgeon: Irving Copas., MD;  Location: [facility];  Service: Gastroenterology;;   INGUINAL HERNIA REPAIR Right 01/19/2021   Procedure: LAPAROSCOPIC RIGHT INGUINAL HERNIA REPAIR WITH MESH;  Surgeon: Stechschulte, Nickola Major, MD;  Location: [place];  Service: General;  Laterality: Right;    obstruction removed as a child   swallowed a peanut   POLYPECTOMY  03/10/2021   Procedure: POLYPECTOMY;  Surgeon: Mansouraty, Telford Nab., MD;  Location: [facility];  Service: Gastroenterology;;   POLYPECTOMY     SUBMUCOSAL LIFTING INJECTION  03/10/2021   Procedure: SUBMUCOSAL LIFTING INJECTION;  Surgeon: Irving Copas., MD;  Location: [facility];  Service: Gastroenterology;;    Prior to Admission medications   Medication Sig Start Date End Date Taking? Authorizing Provider  Albuterol Sulfate (PROAIR RESPICLICK) 272 (90 Base) MCG/ACT AEPB INHALE 2 PUFFS INTO THE LUNGS EVERY 6 (SIX) HOURS AS NEEDED. 09/07/21 09/07/22 Yes Elsie Stain, MD  amLODipine (NORVASC) 10 MG tablet Take 1 tablet (10 mg total) by mouth daily. 09/07/21 09/07/22 Yes Elsie Stain, MD  budesonide-formoterol (SYMBICORT) 160-4.5 MCG/ACT inhaler INHALE 2 PUFFS BY MOUTH TWICE DAILY. 09/07/21 09/07/22 Yes Elsie Stain, MD  Multiple Vitamin (MULTIVITAMIN ADULT PO) Take by mouth.   Yes [provider]  omeprazole (PRILOSEC) 40 MG capsule Take 1 capsule (40 mg total) by mouth daily before dinner. 09/07/21  Yes Elsie Stain, MD  valsartan-hydrochlorothiazide (DIOVAN-HCT) 160-25 MG tablet TAKE 1 TABLET BY MOUTH DAILY. 09/07/21 09/07/22 Yes Elsie Stain, MD  sildenafil (VIAGRA) 100 MG tablet Take 1 tablet (100 mg total) by mouth daily as needed for erectile dysfunction. 09/07/21 09/07/22  Elsie Stain, MD    Current Outpatient Medications  Medication Sig Dispense Refill   Albuterol Sulfate (PROAIR RESPICLICK) 536 (90  Base) MCG/ACT AEPB INHALE 2 PUFFS INTO THE LUNGS EVERY 6 (SIX) HOURS AS NEEDED. 1 each 4   amLODipine (NORVASC) 10 MG tablet Take 1 tablet (10 mg total) by mouth daily. 90 tablet 3   budesonide-formoterol (SYMBICORT) 160-4.5 MCG/ACT inhaler INHALE 2 PUFFS BY MOUTH TWICE DAILY. 10.2 g 3   Multiple Vitamin (MULTIVITAMIN ADULT PO) Take by mouth.     omeprazole (PRILOSEC) 40  MG capsule Take 1 capsule (40 mg total) by mouth daily before dinner. 30 capsule 1   valsartan-hydrochlorothiazide (DIOVAN-HCT) 160-25 MG tablet TAKE 1 TABLET BY MOUTH DAILY. 90 tablet 1   sildenafil (VIAGRA) 100 MG tablet Take 1 tablet (100 mg total) by mouth daily as needed for erectile dysfunction. 6 tablet 1   Current Facility-Administered Medications  Medication Dose Route Frequency Provider Last Rate Last Admin   0.9 %  sodium chloride infusion  500 mL Intravenous Once Gatha Mayer, MD        Allergies as of 10/29/2021   (No Known Allergies)    Family History  Problem Relation Age of Onset   Cancer Sister    Colon polyps Sister    Colon polyps Father    Stomach cancer Father 79   Colon polyps Brother    Colon cancer Neg Hx    Esophageal cancer Neg Hx    Rectal cancer Neg Hx    Inflammatory bowel disease Neg Hx    Liver disease Neg Hx    Pancreatic cancer Neg Hx     Social History   Socioeconomic History   Marital status: Divorced    Spouse name: Not on file   Number of children: Not on file   Years of education: Not on file   Highest education level: Not on file  Occupational History   Not on file  Tobacco Use   Smoking status: Former    Types: Cigarettes    Quit date: 03/14/2020    Years since quitting: 1.6   Smokeless tobacco: Never  Vaping Use   Vaping Use: Never used  Substance and Sexual Activity   Alcohol use: Not Currently    Alcohol/week: 1.0 standard drink    Types: 1 Cans of beer per week    Comment: quit 2000   Drug use: Yes    Frequency: 1.0 times per week    Types: Cocaine    Comment: last use cocaine 10/26/2021   Sexual activity: Not on file  Other Topics Concern   Not on file  Social History Narrative   Not on file   Social Determinants of Health   Financial Resource Strain: Medium Risk   Difficulty of Paying Living Expenses: Somewhat hard  Food Insecurity: No Food Insecurity   Worried About Charity fundraiser in the Last Year:  Never true   Ran Out of Food in the Last Year: Never true  Transportation Needs: No Transportation Needs   Lack of Transportation (Medical): No   Lack of Transportation (Non-Medical): No  Physical Activity: Sufficiently Active   Days of Exercise per Week: 5 days   Minutes of Exercise per Session: 30 min  Stress: Stress Concern Present   Feeling of Stress : To some extent  Social Connections: Moderately Isolated   Frequency of Communication with Friends and Family: More than three times a week   Frequency of Social Gatherings with Friends and Family: Once a week   Attends Religious Services: Never   Marine scientist or Organizations: Yes  Attends Music therapist: More than 4 times per year   Marital Status: Divorced  Human resources officer Violence: Not At Risk   Fear of Current or Ex-Partner: No   Emotionally Abused: No   Physically Abused: No   Sexually Abused: No    Review of Systems:  All other review of systems negative except as mentioned in the HPI.  Physical Exam: Vital signs BP (!) 164/91    Pulse 72    Temp (!) 97.3 F (36.3 C) (Temporal)    Resp 17    Ht 6' (1.829 m)    Wt 171 lb (77.6 kg)    SpO2 99%    BMI 23.19 kg/m   General:   Alert,  Well-developed, well-nourished, pleasant and cooperative in NAD Lungs:  Clear throughout to auscultation.   Heart:  Regular rate and rhythm; no murmurs, clicks, rubs,  or gallops. Abdomen:  Soft, nontender and nondistended. Normal bowel sounds.   Neuro/Psych:  Alert and cooperative. Normal mood and affect. A and O x 3   @Eloise Mula  Simonne Maffucci, MD, [facility] [phone] (pager) 10/29/2021 10:19 AM@

## 2021-10-29 NOTE — Progress Notes (Signed)
Called to room to assist during endoscopic procedure.  Patient ID and intended procedure confirmed with present staff. Received instructions for my participation in the procedure from the performing physician.  

## 2021-10-29 NOTE — Progress Notes (Signed)
VS completed by CW.   Pt's states no medical or surgical changes since previsit or office visit.  

## 2021-10-29 NOTE — Op Note (Signed)
[facility] Patient Name: Jordan [name] Procedure Date: 10/29/2021 9:58 AM MRN: [id number] Endoscopist: Gatha Mayer , MD Age: 65 Referring MD:  Date of Birth: November 13, 1956 Gender: Male Account #: [id number] Procedure:                Colonoscopy Indications:              High risk colon cancer surveillance: Personal                            history of colon cancer, Last colonoscopy: 2021 Medicines:                Propofol per Anesthesia, Monitored Anesthesia Care Procedure:                Pre-Anesthesia Assessment:                           - Prior to the procedure, a History and Physical                            was performed, and patient medications and                            allergies were reviewed. The patient's tolerance of                            previous anesthesia was also reviewed. The risks                            and benefits of the procedure and the sedation                            options and risks were discussed with the patient.                            All questions were answered, and informed consent                            was obtained. Prior Anticoagulants: The patient has                            taken no previous anticoagulant or antiplatelet                            agents. ASA Grade Assessment: III - A patient with                            severe systemic disease. After reviewing the risks                            and benefits, the patient was deemed in                            satisfactory condition to undergo the procedure.  After obtaining informed consent, the colonoscope                            was passed under direct vision. Throughout the                            procedure, the patient's blood pressure, pulse, and                            oxygen saturations were monitored continuously. The                            Olympus CF-HQ190L [id number]) Colonoscope was                             introduced through the anus and advanced to the the                            cecum, identified by appendiceal orifice and                            ileocecal valve. The colonoscopy was somewhat                            difficult due to significant looping. Successful                            completion of the procedure was aided by applying                            abdominal pressure. The patient tolerated the                            procedure well. The quality of the bowel                            preparation was good. The bowel preparation used                            was GoLYTELY via split dose instruction. Scope In: 10:27:16 AM Scope Out: 10:52:42 AM Scope Withdrawal Time: 0 hours 21 minutes 41 seconds  Total Procedure Duration: 0 hours 25 minutes 26 seconds  Findings:                 The perianal and digital rectal examinations were                            normal. Pertinent negatives include normal prostate                            (size, shape, and consistency).                           Four sessile polyps were found in the descending  colon and cecum. The polyps were 3 to 8 mm in size.                            These polyps were removed with a cold snare.                            Resection and retrieval were complete. Verification                            of patient identification for the specimen was                            done. Estimated blood loss was minimal.                           A post polypectomy scar was found in the distal                            sigmoid colon. There was no evidence of the                            previous polyp. Biopsies were taken with a cold                            forceps for histology. Verification of patient                            identification for the specimen was done.                           A tattoo was seen in the distal sigmoid colon.                            Multiple diverticula were found in the sigmoid                            colon.                           The exam was otherwise without abnormality on                            direct and retroflexion views. Complications:            No immediate complications. Estimated Blood Loss:     Estimated blood loss was minimal. Impression:               - Four 3 to 8 mm polyps in the descending colon and                            in the cecum, removed with a cold snare. Resected                            and retrieved.                           -  Post-polypectomy scar in the distal sigmoid                            colon. Biopsied.                           - A tattoo was seen in the distal sigmoid colon.                           - Diverticulosis in the sigmoid colon.                           - The examination was otherwise normal on direct                            and retroflexion views.                           - Personal history of malignant neoplasm of the                            colon.                           - Personal history of colonic polyps. 3 other                            adenomas found at flex sig 02/2021 (when cap                            assisted EMR of cancer site) Recommendation:           - Patient has a contact number available for                            emergencies. The signs and symptoms of potential                            delayed complications were discussed with the                            patient. Return to normal activities tomorrow.                            Written discharge instructions were provided to the                            patient.                           - Resume previous diet.                           - Continue present medications.                           - Await pathology  results.                           - Repeat colonoscopy is recommended for                            surveillance. The colonoscopy date will be                             determined after pathology results from today's                            exam become available for review. Gatha Mayer, MD 10/29/2021 11:09:05 AM This report has been signed electronically.

## 2021-11-02 ENCOUNTER — Encounter: Payer: Self-pay | Admitting: Internal Medicine

## 2021-11-02 ENCOUNTER — Telehealth: Payer: Self-pay | Admitting: *Deleted

## 2021-11-02 ENCOUNTER — Telehealth: Payer: Self-pay

## 2021-11-02 NOTE — Telephone Encounter (Signed)
°  Follow up Call-  Call back number 10/29/2021 11/24/2020 11/11/2020  Post procedure Call Back phone  # [phone]  Permission to leave phone message Yes Yes Yes  Some recent data might be hidden     Left message

## 2021-11-02 NOTE — Telephone Encounter (Signed)
No answer for post procedure call back. Unable to leave message. 

## 2021-11-09 ENCOUNTER — Other Ambulatory Visit: Payer: Self-pay

## 2021-11-10 ENCOUNTER — Other Ambulatory Visit: Payer: Self-pay

## 2021-12-23 ENCOUNTER — Other Ambulatory Visit: Payer: Self-pay

## 2022-01-08 NOTE — Progress Notes (Signed)
Established Patient Office Visit  Subjective:  Patient ID: Jordan [name], male    DOB: 12-05-1955  Age: 66 y.o. MRN: [id number]  CC:  Chief Complaint  Patient presents with   Hypertension    HPI Jordan [name] presents for HTN, COPD, colon CA HEP C f/u. On arrival blood pressure 140/80 on recheck.  Patient had seen gastroenterology last fall undergone repeat colonoscopy and had no evidence of cancer recurrence.  He is to have repeat colonoscopy in 2 years.  Patient's breathing is at baseline he is on the Symbicort and albuterol patient maintains amlodipine and valsartan HCT for blood pressure.  He uses Viagra for erectile dysfunction.  Hepatitis C has cleared.  He has minimal cough at this time.  He does have poor dentition and needs to have these teeth removed in he does plan on obtaining a dental evaluation for same.  Patient did receive the flu vaccine at this visit.  There are no other complaints.  He does continue to have erectile dysfunction uses the Viagra.   Past Medical History:  Diagnosis Date   Allergy    seasonal   Aneurysm of infrarenal abdominal aorta    3.4 cm per angela mcclung pa 04-16-2020 office note   Colon cancer [facility])    COPD (chronic obstructive pulmonary disease) ([place])    Family history of adverse reaction to anesthesia    mother slow to awaken   GERD (gastroesophageal reflux disease)    Hepatitis    hepatitis c dx in remission per pt   Hypertension    Inguinal hernia    Multiple fractures 06/04/2020   Positive colorectal cancer screening using Cologuard test 08/27/2020   Substance abuse ([place])    Wears glasses    for reading    Past Surgical History:  Procedure Laterality Date   COLONOSCOPY  11/11/2020   ENDOSCOPIC MUCOSAL RESECTION N/A 03/10/2021   Procedure: ENDOSCOPIC MUCOSAL RESECTION;  Surgeon: Irving Copas., MD;  Location: [facility];  Service: Gastroenterology;  Laterality: N/A;   FLEXIBLE SIGMOIDOSCOPY N/A 03/10/2021    Procedure: FLEXIBLE SIGMOIDOSCOPY;  Surgeon: Rush Landmark Telford Nab., MD;  Location: [facility];  Service: Gastroenterology;  Laterality: N/A;   HEMOSTASIS CLIP PLACEMENT  03/10/2021   Procedure: HEMOSTASIS CLIP PLACEMENT;  Surgeon: Irving Copas., MD;  Location: [facility];  Service: Gastroenterology;;   INGUINAL HERNIA REPAIR Right 01/19/2021   Procedure: LAPAROSCOPIC RIGHT INGUINAL HERNIA REPAIR WITH MESH;  Surgeon: Stechschulte, Nickola Major, MD;  Location: [place];  Service: General;  Laterality: Right;   obstruction removed as a child   swallowed a peanut   POLYPECTOMY  03/10/2021   Procedure: POLYPECTOMY;  Surgeon: Mansouraty, Telford Nab., MD;  Location: [facility];  Service: Gastroenterology;;   POLYPECTOMY     SUBMUCOSAL LIFTING INJECTION  03/10/2021   Procedure: SUBMUCOSAL LIFTING INJECTION;  Surgeon: Irving Copas., MD;  Location: [facility];  Service: Gastroenterology;;    Family History  Problem Relation Age of Onset   Cancer Sister    Colon polyps Sister    Colon polyps Father    Stomach cancer Father 45   Colon polyps Brother    Colon cancer Neg Hx    Esophageal cancer Neg Hx    Rectal cancer Neg Hx    Inflammatory bowel disease Neg Hx    Liver disease Neg Hx    Pancreatic cancer Neg Hx     Social History   Socioeconomic History   Marital status: Divorced  Spouse name: Not on file   Number of children: Not on file   Years of education: Not on file   Highest education level: Not on file  Occupational History   Not on file  Tobacco Use   Smoking status: Former    Types: Cigarettes    Quit date: 03/14/2020    Years since quitting: 1.8   Smokeless tobacco: Never  Vaping Use   Vaping Use: Never used  Substance and Sexual Activity   Alcohol use: Not Currently    Alcohol/week: 1.0 standard drink    Types: 1 Cans of beer per week    Comment: quit 2000   Drug use: Yes    Frequency: 1.0 times per week    Types: Cocaine     Comment: last use cocaine 10/26/2021   Sexual activity: Not on file  Other Topics Concern   Not on file  Social History Narrative   Not on file   Social Determinants of Health   Financial Resource Strain: Medium Risk   Difficulty of Paying Living Expenses: Somewhat hard  Food Insecurity: No Food Insecurity   Worried About Charity fundraiser in the Last Year: Never true   Ran Out of Food in the Last Year: Never true  Transportation Needs: No Transportation Needs   Lack of Transportation (Medical): No   Lack of Transportation (Non-Medical): No  Physical Activity: Sufficiently Active   Days of Exercise per Week: 5 days   Minutes of Exercise per Session: 30 min  Stress: Stress Concern Present   Feeling of Stress : To some extent  Social Connections: Moderately Isolated   Frequency of Communication with Friends and Family: More than three times a week   Frequency of Social Gatherings with Friends and Family: Once a week   Attends Religious Services: Never   Marine scientist or Organizations: Yes   Attends Music therapist: More than 4 times per year   Marital Status: Divorced  Human resources officer Violence: Not At Risk   Fear of Current or Ex-Partner: No   Emotionally Abused: No   Physically Abused: No   Sexually Abused: No    Outpatient Medications Prior to Visit  Medication Sig Dispense Refill   Multiple Vitamin (MULTIVITAMIN ADULT PO) Take by mouth.     Albuterol Sulfate (PROAIR RESPICLICK) 469 (90 Base) MCG/ACT AEPB INHALE 2 PUFFS INTO THE LUNGS EVERY 6 (SIX) HOURS AS NEEDED. 1 each 4   amLODipine (NORVASC) 10 MG tablet Take 1 tablet (10 mg total) by mouth daily. 90 tablet 3   budesonide-formoterol (SYMBICORT) 160-4.5 MCG/ACT inhaler INHALE 2 PUFFS BY MOUTH TWICE DAILY. 10.2 g 3   omeprazole (PRILOSEC) 40 MG capsule Take 1 capsule (40 mg total) by mouth daily before dinner. 30 capsule 1   sildenafil (VIAGRA) 100 MG tablet Take 1 tablet (100 mg total) by  mouth daily as needed for erectile dysfunction. 6 tablet 1   valsartan-hydrochlorothiazide (DIOVAN-HCT) 160-25 MG tablet TAKE 1 TABLET BY MOUTH DAILY. 90 tablet 1   No facility-administered medications prior to visit.    No Known Allergies  ROS Review of Systems  Constitutional: Negative.   HENT: Negative.  Negative for ear pain, postnasal drip, rhinorrhea, sinus pressure, sore throat, trouble swallowing and voice change.   Eyes: Negative.   Respiratory: Negative.  Negative for apnea, cough, choking, chest tightness, shortness of breath, wheezing and stridor.   Cardiovascular: Negative.  Negative for chest pain, palpitations and leg swelling.  Gastrointestinal: Negative.  Negative for abdominal distention, abdominal pain, nausea and vomiting.  Genitourinary: Negative.   Musculoskeletal: Negative.  Negative for arthralgias and myalgias.  Skin: Negative.  Negative for rash.  Allergic/Immunologic: Negative.  Negative for environmental allergies and food allergies.  Neurological: Negative.  Negative for dizziness, syncope, weakness and headaches.  Hematological: Negative.  Negative for adenopathy. Does not bruise/bleed easily.  Psychiatric/Behavioral: Negative.  Negative for agitation and sleep disturbance. The patient is not nervous/anxious.      Objective:    Physical Exam Vitals reviewed.  Constitutional:      Appearance: Normal appearance. He is well-developed. He is not diaphoretic.  HENT:     Head: Normocephalic and atraumatic.     Nose: No nasal deformity, septal deviation, mucosal edema or rhinorrhea.     Right Sinus: No maxillary sinus tenderness or frontal sinus tenderness.     Left Sinus: No maxillary sinus tenderness or frontal sinus tenderness.     Mouth/Throat:     Pharynx: No oropharyngeal exudate.     Comments: Multiple dental caries poor dentition Eyes:     General: No scleral icterus.    Conjunctiva/sclera: Conjunctivae normal.     Pupils: Pupils are equal,  round, and reactive to light.  Neck:     Thyroid: No thyromegaly.     Vascular: No carotid bruit or JVD.     Trachea: Trachea normal. No tracheal tenderness or tracheal deviation.  Cardiovascular:     Rate and Rhythm: Normal rate and regular rhythm.     Chest Wall: PMI is not displaced.     Pulses: Normal pulses. No decreased pulses.     Heart sounds: Normal heart sounds, S1 normal and S2 normal. Heart sounds not distant. No murmur heard. No systolic murmur is present.  No diastolic murmur is present.    No friction rub. No gallop. No S3 or S4 sounds.  Pulmonary:     Effort: No tachypnea, accessory muscle usage or respiratory distress.     Breath sounds: No stridor. Wheezing present. No decreased breath sounds, rhonchi or rales.  Chest:     Chest wall: No tenderness.  Abdominal:     General: Bowel sounds are normal. There is no distension.     Palpations: Abdomen is soft. Abdomen is not rigid.     Tenderness: There is no abdominal tenderness. There is no guarding or rebound.  Musculoskeletal:        General: Normal range of motion.     Cervical back: Normal range of motion and neck supple. No edema, erythema or rigidity. No muscular tenderness. Normal range of motion.  Lymphadenopathy:     Head:     Right side of head: No submental or submandibular adenopathy.     Left side of head: No submental or submandibular adenopathy.     Cervical: No cervical adenopathy.  Skin:    General: Skin is warm and dry.     Coloration: Skin is not pale.     Findings: No rash.     Nails: There is no clubbing.  Neurological:     Mental Status: He is alert and oriented to person, place, and time.     Sensory: No sensory deficit.  Psychiatric:        Speech: Speech normal.        Behavior: Behavior normal.    BP 140/80    Pulse 83    Resp 18    Wt 175 lb 9.6 oz (79.7 kg)    SpO2  96%    BMI 23.82 kg/m  Wt Readings from Last 3 Encounters:  01/10/22 175 lb 9.6 oz (79.7 kg)  10/29/21 171 lb (77.6  kg)  10/25/21 171 lb (77.6 kg)     Health Maintenance Due  Topic Date Due   INFLUENZA VACCINE  Never done    There are no preventive care reminders to display for this patient.  No results found for: TSH Lab Results  Component Value Date   WBC 4.4 01/18/2021   HGB 16.6 01/18/2021   HCT 49.6 01/18/2021   MCV 90.0 01/18/2021   PLT 233 01/18/2021   Lab Results  Component Value Date   NA 137 01/18/2021   K 4.1 01/18/2021   CO2 26 01/18/2021   GLUCOSE 100 (H) 01/18/2021   BUN 36 (H) 01/18/2021   CREATININE 1.54 (H) 01/18/2021   BILITOT 0.6 01/18/2021   ALKPHOS 70 01/18/2021   AST 36 01/18/2021   ALT 48 (H) 01/18/2021   PROT 7.2 01/18/2021   ALBUMIN 3.9 01/18/2021   CALCIUM 8.9 01/18/2021   ANIONGAP 8 01/18/2021   No results found for: CHOL No results found for: HDL No results found for: LDLCALC No results found for: TRIG No results found for: CHOLHDL No results found for: HGBA1C    Assessment & Plan:   Problem List Items Addressed This Visit       Cardiovascular and Mediastinum   Hypertension - Primary    At goal continue valsartan HCT and amlodipine      Relevant Medications   amLODipine (NORVASC) 10 MG tablet   sildenafil (VIAGRA) 100 MG tablet   valsartan-hydrochlorothiazide (DIOVAN-HCT) 160-25 MG tablet   Other Relevant Orders   Comprehensive metabolic panel   CBC with Differential/Platelet     Respiratory   COPD with chronic bronchitis (HCC)    Stable at this time now off tobacco products continue inhalers      Relevant Medications   Albuterol Sulfate (PROAIR RESPICLICK) 242 (90 Base) MCG/ACT AEPB   budesonide-formoterol (SYMBICORT) 160-4.5 MCG/ACT inhaler     Digestive   Colon cancer ([place])   Dental caries    Multiple dental caries is planning to see dentist      Adenocarcinoma of colon [facility])    History of adenocarcinoma of the colon he has no residual disease as of December 2020 2 repeat colonoscopy in December 2024        Other    Hepatitis C antibody positive in blood    Patient has undergone therapy      Erectile dysfunction    Offered urology evaluation patient wishes to hold off on this      Other Visit Diagnoses     Encounter for health-related screening       Relevant Orders   Lipid panel       Meds ordered this encounter  Medications   Albuterol Sulfate (PROAIR RESPICLICK) 353 (90 Base) MCG/ACT AEPB    Sig: INHALE 2 PUFFS INTO THE LUNGS EVERY 6 (SIX) HOURS AS NEEDED.    Dispense:  1 each    Refill:  4   amLODipine (NORVASC) 10 MG tablet    Sig: Take 1 tablet (10 mg total) by mouth daily.    Dispense:  90 tablet    Refill:  3   budesonide-formoterol (SYMBICORT) 160-4.5 MCG/ACT inhaler    Sig: INHALE 2 PUFFS BY MOUTH TWICE DAILY.    Dispense:  10.2 g    Refill:  3   omeprazole (PRILOSEC) 40  MG capsule    Sig: Take 1 capsule (40 mg total) by mouth daily before dinner.    Dispense:  30 capsule    Refill:  1   sildenafil (VIAGRA) 100 MG tablet    Sig: Take 1 tablet (100 mg total) by mouth daily as needed for erectile dysfunction.    Dispense:  6 tablet    Refill:  1   valsartan-hydrochlorothiazide (DIOVAN-HCT) 160-25 MG tablet    Sig: TAKE 1 TABLET BY MOUTH DAILY.    Dispense:  90 tablet    Refill:  1  We will obtain health screening labs  Follow-up: Return in about 4 months (around 05/10/2022).    Asencion Noble, MD

## 2022-01-10 ENCOUNTER — Encounter: Payer: Self-pay | Admitting: Critical Care Medicine

## 2022-01-10 ENCOUNTER — Other Ambulatory Visit: Payer: Self-pay

## 2022-01-10 ENCOUNTER — Ambulatory Visit: Payer: Medicare Other | Attending: Critical Care Medicine | Admitting: Critical Care Medicine

## 2022-01-10 VITALS — BP 140/80 | HR 83 | Resp 18 | Wt 175.6 lb

## 2022-01-10 DIAGNOSIS — Z139 Encounter for screening, unspecified: Secondary | ICD-10-CM | POA: Diagnosis not present

## 2022-01-10 DIAGNOSIS — C187 Malignant neoplasm of sigmoid colon: Secondary | ICD-10-CM | POA: Diagnosis not present

## 2022-01-10 DIAGNOSIS — N529 Male erectile dysfunction, unspecified: Secondary | ICD-10-CM

## 2022-01-10 DIAGNOSIS — I10 Essential (primary) hypertension: Secondary | ICD-10-CM

## 2022-01-10 DIAGNOSIS — K029 Dental caries, unspecified: Secondary | ICD-10-CM

## 2022-01-10 DIAGNOSIS — J449 Chronic obstructive pulmonary disease, unspecified: Secondary | ICD-10-CM

## 2022-01-10 DIAGNOSIS — R768 Other specified abnormal immunological findings in serum: Secondary | ICD-10-CM

## 2022-01-10 DIAGNOSIS — C189 Malignant neoplasm of colon, unspecified: Secondary | ICD-10-CM

## 2022-01-10 MED ORDER — BUDESONIDE-FORMOTEROL FUMARATE 160-4.5 MCG/ACT IN AERO
2.0000 | INHALATION_SPRAY | Freq: Two times a day (BID) | RESPIRATORY_TRACT | 3 refills | Status: DC
  Filled 2022-01-10 – 2022-03-01 (×2): qty 10.2, 30d supply, fill #0

## 2022-01-10 MED ORDER — ALBUTEROL SULFATE 108 (90 BASE) MCG/ACT IN AEPB
INHALATION_SPRAY | RESPIRATORY_TRACT | 4 refills | Status: DC
  Filled 2022-01-10: qty 1, 25d supply, fill #0

## 2022-01-10 MED ORDER — AMLODIPINE BESYLATE 10 MG PO TABS
10.0000 mg | ORAL_TABLET | Freq: Every day | ORAL | 3 refills | Status: DC
Start: 2022-01-10 — End: 2022-05-10
  Filled 2022-01-10 – 2022-02-02 (×2): qty 90, 90d supply, fill #0

## 2022-01-10 MED ORDER — VALSARTAN-HYDROCHLOROTHIAZIDE 160-25 MG PO TABS
1.0000 | ORAL_TABLET | Freq: Every day | ORAL | 1 refills | Status: DC
  Filled 2022-01-10 – 2022-02-02 (×2): qty 90, 90d supply, fill #0

## 2022-01-10 MED ORDER — SILDENAFIL CITRATE 100 MG PO TABS
100.0000 mg | ORAL_TABLET | Freq: Every day | ORAL | 1 refills | Status: DC | PRN
Start: 2022-01-10 — End: 2022-05-10
  Filled 2022-01-10: qty 6, 30d supply, fill #0

## 2022-01-10 MED ORDER — OMEPRAZOLE 40 MG PO CPDR
40.0000 mg | DELAYED_RELEASE_CAPSULE | Freq: Every day | ORAL | 1 refills | Status: DC
  Filled 2022-01-10 – 2022-02-02 (×2): qty 30, 30d supply, fill #0
  Filled 2022-04-04: qty 30, 30d supply, fill #1

## 2022-01-10 NOTE — Assessment & Plan Note (Signed)
Stable at this time now off tobacco products continue inhalers

## 2022-01-10 NOTE — Assessment & Plan Note (Signed)
Offered urology evaluation patient wishes to hold off on this

## 2022-01-10 NOTE — Assessment & Plan Note (Signed)
Multiple dental caries is planning to see dentist

## 2022-01-10 NOTE — Assessment & Plan Note (Signed)
History of adenocarcinoma of the colon he has no residual disease as of December 2020 2 repeat colonoscopy in December 2024

## 2022-01-10 NOTE — Assessment & Plan Note (Signed)
Patient has undergone therapy

## 2022-01-10 NOTE — Patient Instructions (Addendum)
No change in medications  Complete screening labs today  Return 4 months  Call if you wish to go to urology for erectile dysfunction evaluation  Work on getting teeth removed

## 2022-01-10 NOTE — Assessment & Plan Note (Signed)
At goal continue valsartan HCT and amlodipine

## 2022-01-11 ENCOUNTER — Telehealth: Payer: Self-pay

## 2022-01-11 ENCOUNTER — Other Ambulatory Visit: Payer: Self-pay

## 2022-01-11 LAB — CBC WITH DIFFERENTIAL/PLATELET
Basophils Absolute: 0 10*3/uL (ref 0.0–0.2)
Basos: 1 %
EOS (ABSOLUTE): 0.2 10*3/uL (ref 0.0–0.4)
Eos: 4 %
Hematocrit: 47.6 % (ref 37.5–51.0)
Hemoglobin: 16.4 g/dL (ref 13.0–17.7)
Immature Grans (Abs): 0 10*3/uL (ref 0.0–0.1)
Immature Granulocytes: 0 %
Lymphocytes Absolute: 1.1 10*3/uL (ref 0.7–3.1)
Lymphs: 23 %
MCH: 29.8 pg (ref 26.6–33.0)
MCHC: 34.5 g/dL (ref 31.5–35.7)
MCV: 86 fL (ref 79–97)
Monocytes Absolute: 0.5 10*3/uL (ref 0.1–0.9)
Monocytes: 11 %
Neutrophils Absolute: 3 10*3/uL (ref 1.4–7.0)
Neutrophils: 61 %
Platelets: 292 10*3/uL (ref 150–450)
RBC: 5.51 x10E6/uL (ref 4.14–5.80)
RDW: 13 % (ref 11.6–15.4)
WBC: 4.8 10*3/uL (ref 3.4–10.8)

## 2022-01-11 LAB — COMPREHENSIVE METABOLIC PANEL
ALT: 50 IU/L — ABNORMAL HIGH (ref 0–44)
AST: 39 IU/L (ref 0–40)
Albumin/Globulin Ratio: 1.2 (ref 1.2–2.2)
Albumin: 3.9 g/dL (ref 3.8–4.8)
Alkaline Phosphatase: 78 IU/L (ref 44–121)
BUN/Creatinine Ratio: 18 (ref 10–24)
BUN: 22 mg/dL (ref 8–27)
Bilirubin Total: 0.2 mg/dL (ref 0.0–1.2)
CO2: 23 mmol/L (ref 20–29)
Calcium: 9.4 mg/dL (ref 8.6–10.2)
Chloride: 104 mmol/L (ref 96–106)
Creatinine, Ser: 1.2 mg/dL (ref 0.76–1.27)
Globulin, Total: 3.3 g/dL (ref 1.5–4.5)
Glucose: 97 mg/dL (ref 70–99)
Potassium: 4.7 mmol/L (ref 3.5–5.2)
Sodium: 142 mmol/L (ref 134–144)
Total Protein: 7.2 g/dL (ref 6.0–8.5)
eGFR: 67 mL/min/{1.73_m2} (ref >59)

## 2022-01-11 LAB — LIPID PANEL
Chol/HDL Ratio: 4.3 ratio (ref 0.0–5.0)
Cholesterol, Total: 164 mg/dL (ref 100–199)
HDL: 38 mg/dL — ABNORMAL LOW (ref >39)
LDL Chol Calc (NIH): 84 mg/dL (ref 0–99)
Triglycerides: 250 mg/dL — ABNORMAL HIGH (ref 0–149)
VLDL Cholesterol Cal: 42 mg/dL — ABNORMAL HIGH (ref 5–40)

## 2022-01-11 NOTE — Telephone Encounter (Signed)
-----   Message from Elsie Stain, MD sent at 01/11/2022  6:25 AM EST ----- Let pt know all labs are normal, cholesterol is not elevated.

## 2022-01-11 NOTE — Telephone Encounter (Signed)
Pt was called and is aware of results, DOB was confirmed.  ?

## 2022-01-17 ENCOUNTER — Other Ambulatory Visit: Payer: Self-pay

## 2022-02-02 ENCOUNTER — Other Ambulatory Visit: Payer: Self-pay

## 2022-02-03 ENCOUNTER — Other Ambulatory Visit: Payer: Self-pay

## 2022-03-01 ENCOUNTER — Other Ambulatory Visit: Payer: Self-pay

## 2022-04-04 ENCOUNTER — Other Ambulatory Visit: Payer: Self-pay

## 2022-04-05 ENCOUNTER — Other Ambulatory Visit: Payer: Self-pay

## 2022-05-10 ENCOUNTER — Encounter: Payer: Self-pay | Admitting: Critical Care Medicine

## 2022-05-10 ENCOUNTER — Other Ambulatory Visit: Payer: Self-pay

## 2022-05-10 ENCOUNTER — Ambulatory Visit: Payer: Medicare Other | Attending: Critical Care Medicine | Admitting: Critical Care Medicine

## 2022-05-10 VITALS — BP 130/74 | HR 87 | Wt 168.0 lb

## 2022-05-10 DIAGNOSIS — L989 Disorder of the skin and subcutaneous tissue, unspecified: Secondary | ICD-10-CM

## 2022-05-10 DIAGNOSIS — W57XXXA Bitten or stung by nonvenomous insect and other nonvenomous arthropods, initial encounter: Secondary | ICD-10-CM | POA: Diagnosis not present

## 2022-05-10 DIAGNOSIS — J449 Chronic obstructive pulmonary disease, unspecified: Secondary | ICD-10-CM

## 2022-05-10 DIAGNOSIS — K029 Dental caries, unspecified: Secondary | ICD-10-CM

## 2022-05-10 DIAGNOSIS — C189 Malignant neoplasm of colon, unspecified: Secondary | ICD-10-CM

## 2022-05-10 DIAGNOSIS — R768 Other specified abnormal immunological findings in serum: Secondary | ICD-10-CM

## 2022-05-10 DIAGNOSIS — I10 Essential (primary) hypertension: Secondary | ICD-10-CM

## 2022-05-10 DIAGNOSIS — N529 Male erectile dysfunction, unspecified: Secondary | ICD-10-CM

## 2022-05-10 MED ORDER — ALBUTEROL SULFATE 108 (90 BASE) MCG/ACT IN AEPB
INHALATION_SPRAY | RESPIRATORY_TRACT | 4 refills | Status: DC
  Filled 2022-05-10: qty 1, 30d supply, fill #0

## 2022-05-10 MED ORDER — AMLODIPINE BESYLATE 10 MG PO TABS
10.0000 mg | ORAL_TABLET | Freq: Every day | ORAL | 3 refills | Status: DC
  Filled 2022-05-10: qty 90, 90d supply, fill #0
  Filled 2022-09-26: qty 90, 90d supply, fill #1

## 2022-05-10 MED ORDER — SYMBICORT 160-4.5 MCG/ACT IN AERO
2.0000 | INHALATION_SPRAY | Freq: Two times a day (BID) | RESPIRATORY_TRACT | 11 refills | Status: DC
Start: 2022-05-10 — End: 2022-10-11
  Filled 2022-05-10: qty 10.2, 30d supply, fill #0

## 2022-05-10 MED ORDER — OMEPRAZOLE 40 MG PO CPDR
40.0000 mg | DELAYED_RELEASE_CAPSULE | Freq: Every day | ORAL | 3 refills | Status: DC
Start: 2022-05-10 — End: 2022-10-11
  Filled 2022-05-10: qty 90, 90d supply, fill #0
  Filled 2022-08-11: qty 90, 90d supply, fill #1

## 2022-05-10 MED ORDER — SILDENAFIL CITRATE 100 MG PO TABS
100.0000 mg | ORAL_TABLET | Freq: Every day | ORAL | 1 refills | Status: DC | PRN
  Filled 2022-05-10: qty 10, 10d supply, fill #0
  Filled 2022-05-27: qty 10, 30d supply, fill #1

## 2022-05-10 MED ORDER — VALSARTAN-HYDROCHLOROTHIAZIDE 160-25 MG PO TABS
1.0000 | ORAL_TABLET | Freq: Every day | ORAL | 1 refills | Status: DC
  Filled 2022-05-10 – 2022-05-27 (×2): qty 90, 90d supply, fill #0
  Filled 2022-08-30: qty 90, 90d supply, fill #1

## 2022-05-10 NOTE — Assessment & Plan Note (Signed)
Referral to dermatology made this looks like a squamous cell cancer

## 2022-05-10 NOTE — Assessment & Plan Note (Signed)
COPD chronic bronchitis continue inhaled medicines

## 2022-05-10 NOTE — Assessment & Plan Note (Signed)
Encouraged to keep dental appointment

## 2022-05-10 NOTE — Assessment & Plan Note (Addendum)
Hepatitis C will need to be reassayed he may need [place]

## 2022-05-10 NOTE — Assessment & Plan Note (Signed)
Renew Viagra 

## 2022-05-11 ENCOUNTER — Other Ambulatory Visit: Payer: Self-pay

## 2022-05-12 ENCOUNTER — Other Ambulatory Visit: Payer: Self-pay | Admitting: Critical Care Medicine

## 2022-05-12 DIAGNOSIS — R768 Other specified abnormal immunological findings in serum: Secondary | ICD-10-CM

## 2022-05-12 NOTE — Progress Notes (Signed)
Pt aware has pos HEP C in blood ref to [facility] sent

## 2022-05-13 LAB — HEPATIC FUNCTION PANEL
ALT: 42 IU/L (ref 0–44)
AST: 39 IU/L (ref 0–40)
Albumin: 4 g/dL (ref 3.8–4.8)
Alkaline Phosphatase: 73 IU/L (ref 44–121)
Bilirubin Total: 0.2 mg/dL (ref 0.0–1.2)
Bilirubin, Direct: 0.13 mg/dL (ref 0.00–0.40)
Total Protein: 7 g/dL (ref 6.0–8.5)

## 2022-05-13 LAB — HCV RNA QUANT RFLX ULTRA OR GENOTYP
HCV Quant Baseline: 921000 IU/mL
HCV log10: 5.964 log10 IU/mL

## 2022-05-13 LAB — LYME DISEASE SEROLOGY W/REFLEX: Lyme Total Antibody EIA: NEGATIVE

## 2022-05-13 LAB — HEPATITIS C GENOTYPE

## 2022-05-18 ENCOUNTER — Other Ambulatory Visit: Payer: Self-pay

## 2022-05-19 ENCOUNTER — Telehealth: Payer: Self-pay

## 2022-05-19 ENCOUNTER — Encounter: Payer: Self-pay | Admitting: Family

## 2022-05-19 ENCOUNTER — Ambulatory Visit (INDEPENDENT_AMBULATORY_CARE_PROVIDER_SITE_OTHER): Payer: Medicare Other | Admitting: Family

## 2022-05-19 ENCOUNTER — Other Ambulatory Visit (HOSPITAL_COMMUNITY): Payer: Self-pay

## 2022-05-19 ENCOUNTER — Other Ambulatory Visit: Payer: Self-pay

## 2022-05-19 VITALS — BP 145/89 | HR 85 | Temp 97.9°F | Resp 16 | Wt 169.2 lb

## 2022-05-19 DIAGNOSIS — B182 Chronic viral hepatitis C: Secondary | ICD-10-CM

## 2022-05-19 NOTE — Progress Notes (Signed)
Subjective:    Patient ID: Jordan [name], male    DOB: 03-Nov-1956, 66 y.o.   MRN: [id number]  Chief Complaint  Patient presents with   New Patient (Initial Visit)    Hep C     HPI:  Jordan [name] is a 66 y.o. male with previous medical history of adenocarcinoma of the colon, COPD, hypertension, and substance use presenting today for evaluation of Hepatitis C.   [name] was initially diagnosed with Hepatitis C in the early 1990's when he was attempting to donate blood and was informed of his positive test. Risk factors for Hepatitis C include history of IV drug use in the 41s and age being born between 43 and 71. Denies any current symptoms and denies abdominal pain, nausea, vomiting, fatigue, scleral icterus or jaundice. No treatment since initial diagnosis. No personal or family history of liver disease. No current alcohol consumption, with weekly cocaine use and no tobacco use.   Lab work reviewed shows chronic Hepatitis C with Hepatitis C RNA level of 921,000 and genotype 1a.   No Known Allergies    Outpatient Medications Prior to Visit  Medication Sig Dispense Refill   Albuterol Sulfate (PROAIR RESPICLICK) 889 (90 Base) MCG/ACT AEPB INHALE 2 PUFFS INTO THE LUNGS EVERY 6 (SIX) HOURS AS NEEDED. 1 each 4   amLODipine (NORVASC) 10 MG tablet Take 1 tablet (10 mg total) by mouth daily. 90 tablet 3   budesonide-formoterol (SYMBICORT) 160-4.5 MCG/ACT inhaler Inhale 2 puffs into the lungs in the morning and at bedtime. 10.2 g 11   Multiple Vitamin (MULTIVITAMIN ADULT PO) Take by mouth.     omeprazole (PRILOSEC) 40 MG capsule Take 1 capsule (40 mg total) by mouth daily before dinner. 60 capsule 3   sildenafil (VIAGRA) 100 MG tablet Take 1 tablet (100 mg total) by mouth daily as needed for erectile dysfunction. 10 tablet 1   valsartan-hydrochlorothiazide (DIOVAN-HCT) 160-25 MG tablet TAKE 1 TABLET BY MOUTH DAILY. 90 tablet 1   No facility-administered medications prior to  visit.     Past Medical History:  Diagnosis Date   Allergy    seasonal   Aneurysm of infrarenal abdominal aorta (HCC)    3.4 cm per angela mcclung pa 04-16-2020 office note   Colon cancer ([place])    COPD (chronic obstructive pulmonary disease) ([place])    Family history of adverse reaction to anesthesia    mother slow to awaken   GERD (gastroesophageal reflux disease)    Hepatitis    hepatitis c dx in remission per pt   Hypertension    Inguinal hernia    Multiple fractures 06/04/2020   Positive colorectal cancer screening using Cologuard test 08/27/2020   Substance abuse ([place])    Wears glasses    for reading     Past Surgical History:  Procedure Laterality Date   COLONOSCOPY  11/11/2020   ENDOSCOPIC MUCOSAL RESECTION N/A 03/10/2021   Procedure: ENDOSCOPIC MUCOSAL RESECTION;  Surgeon: Irving Copas., MD;  Location: [facility];  Service: Gastroenterology;  Laterality: N/A;   FLEXIBLE SIGMOIDOSCOPY N/A 03/10/2021   Procedure: FLEXIBLE SIGMOIDOSCOPY;  Surgeon: Rush Landmark Telford Nab., MD;  Location: [facility];  Service: Gastroenterology;  Laterality: N/A;   HEMOSTASIS CLIP PLACEMENT  03/10/2021   Procedure: HEMOSTASIS CLIP PLACEMENT;  Surgeon: Irving Copas., MD;  Location: [facility];  Service: Gastroenterology;;   INGUINAL HERNIA REPAIR Right 01/19/2021   Procedure: LAPAROSCOPIC RIGHT INGUINAL HERNIA REPAIR WITH MESH;  Surgeon: Felicie Morn, MD;  Location: [place];  Service: General;  Laterality: Right;   obstruction removed as a child   swallowed a peanut   POLYPECTOMY  03/10/2021   Procedure: POLYPECTOMY;  Surgeon: Mansouraty, Telford Nab., MD;  Location: [facility];  Service: Gastroenterology;;   POLYPECTOMY     SUBMUCOSAL LIFTING INJECTION  03/10/2021   Procedure: SUBMUCOSAL LIFTING INJECTION;  Surgeon: Irving Copas., MD;  Location: [facility];  Service: Gastroenterology;;       Review of Systems   Constitutional:  Negative for chills, diaphoresis, fatigue and fever.  Respiratory:  Negative for cough, chest tightness, shortness of breath and wheezing.   Cardiovascular:  Negative for chest pain.  Gastrointestinal:  Negative for abdominal distention, abdominal pain, constipation, diarrhea, nausea and vomiting.  Neurological:  Negative for weakness and headaches.  Hematological:  Does not bruise/bleed easily.      Objective:    BP (!) 145/89   Pulse 85   Temp 97.9 F (36.6 C) (Oral)   Resp 16   Wt 169 lb 3.2 oz (76.7 kg)   SpO2 95%   BMI 22.95 kg/m  Nursing note and vital signs reviewed.  Physical Exam Constitutional:      General: He is not in acute distress.    Appearance: He is well-developed.  Cardiovascular:     Rate and Rhythm: Normal rate and regular rhythm.     Heart sounds: Normal heart sounds. No murmur heard.    No friction rub. No gallop.  Pulmonary:     Effort: Pulmonary effort is normal. No respiratory distress.     Breath sounds: Normal breath sounds. No wheezing or rales.  Chest:     Chest wall: No tenderness.  Abdominal:     General: Bowel sounds are normal. There is no distension.     Palpations: Abdomen is soft. There is no mass.     Tenderness: There is no abdominal tenderness. There is no guarding or rebound.  Skin:    General: Skin is warm and dry.  Neurological:     Mental Status: He is alert and oriented to person, place, and time.  Psychiatric:        Behavior: Behavior normal.        Thought Content: Thought content normal.        Judgment: Judgment normal.         05/19/2022   10:39 AM 05/10/2022    2:23 PM 01/10/2022    1:43 PM 09/07/2021    1:42 PM 06/07/2021    2:40 PM  Depression screen PHQ 2/9  Decreased Interest 0 0 0 0 0  Down, Depressed, Hopeless 0 0 0 0 0  PHQ - 2 Score 0 0 0 0 0  Altered sleeping   0  0  Tired, decreased energy   1  0  Change in appetite   0  2  Feeling bad or failure about yourself    0  0  Trouble  concentrating   0  0  Moving slowly or fidgety/restless   0  0  Suicidal thoughts   0  0  PHQ-9 Score   1  2       Assessment & Plan:    Patient Active Problem List   Diagnosis Date Noted   Chronic hepatitis C without hepatic coma ([place]) 05/19/2022   Scalp lesion 05/10/2022   Tick bite 05/10/2022   Adenocarcinoma of colon ([place]) 01/19/2021   Dental caries 11/23/2020   Unilateral inguinal hernia  without obstruction or gangrene 10/27/2020   Erectile dysfunction 08/11/2020   COPD with chronic bronchitis ([place]) 06/04/2020   Hypertension 06/04/2020   History of tobacco use 06/04/2020   Compression fracture of thoracic vertebra with routine healing 04/30/2020     Problem List Items Addressed This Visit       Digestive   Chronic hepatitis C without hepatic coma ([place]) - Primary    Jordan [name] is a 66 y/o male with genotype 1a chronic Hepatitis C with initial RNA level of 921,000. 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 Asymptomatic and treatment naive. Reviewed the pathogenesis, transmission, risk if left untreated, treatment options and plan of care. Check lab work today including HIV and Hepatitis B status. Will plan for treatment with 8 weeks of [place] pending lab work results. Follow up in 1 month after start of medication.       Relevant Orders   CBC   Hepatitis B surface antigen   Hepatitis B surface antibody,quantitative   Liver Fibrosis, FibroTest-ActiTest   Protime-INR   HIV Antibody (routine testing w rflx)     I am having Jordan [name] maintain his Multiple Vitamin (MULTIVITAMIN ADULT PO), amLODipine, Symbicort, omeprazole, sildenafil, valsartan-hydrochlorothiazide, and Albuterol Sulfate.   Follow-up: 1 month after start of medication or sooner if needed.    Terri Piedra, MSN, FNP-C Nurse Practitioner [facility] for Infectious Disease [place] number: [phone]

## 2022-05-19 NOTE — Telephone Encounter (Signed)
[facility] Patient Teacher, English as a foreign language completed.    The patient is insured through [employer].  Medication will need a PA  We will continue to follow to see if copay assistance is needed.  Ileene Patrick, [facility] Patient [facility] for Infectious Disease Phone: [phone] Fax:  [phone]

## 2022-05-19 NOTE — Patient Instructions (Signed)
Nice to meet you.   We will check your lab work today.   Limit acetaminophen (Tylenol) usage to no more than 2 grams (2,000 mg) per day.  Avoid alcohol.  Do not share toothbrushes or razors.  Practice safe sex to protect against transmission as well as sexually transmitted disease.    Hepatitis C Hepatitis C is a viral infection of the liver. It can lead to scarring of the liver (cirrhosis), liver failure, or liver cancer. Hepatitis C may go undetected for months or years because people with the infection may not have symptoms, or they may have only mild symptoms. What are the causes? This condition is caused by the hepatitis C virus (HCV). The virus can spread from person to person (is contagious) through: Blood. Childbirth. A woman who has hepatitis C can pass it to her baby during birth. Bodily fluids, such as breast milk, tears, semen, vaginal fluids, and saliva. Blood transfusions or organ transplants done in the [place] before 1992.  What increases the risk? The following factors may make you more likely to develop this condition: Having contact with unclean (contaminated) needles or syringes. This may result from: Acupuncture. Tattoing. Body piercing. Injecting drugs. Having unprotected sex with someone who is infected. Needing treatment to filter your blood (kidney dialysis). Having HIV (human immunodeficiency virus) or AIDS (acquired immunodeficiency syndrome). Working in a job that involves contact with blood or bodily fluids, such as health care.  What are the signs or symptoms? Symptoms of this condition include: Fatigue. Loss of appetite. Nausea. Vomiting. Abdominal pain. Dark yellow urine. Yellowish skin and eyes (jaundice). Itchy skin. Clay-colored bowel movements. Joint pain. Bleeding and bruising easily. Fluid building up in your stomach (ascites).  In some cases, you may not have any symptoms. How is this diagnosed? This condition is  diagnosed with: Blood tests. Other tests to check how well your liver is functioning. They may include: Magnetic resonance elastography (MRE). This imaging test uses MRIs and sound waves to measure liver stiffness. Transient elastography. This imaging test uses ultrasounds to measure liver stiffness. Liver biopsy. This test requires taking a small tissue sample from your liver to examine it under a microscope.  How is this treated? Your health care provider may perform noninvasive tests or a liver biopsy to help decide the best course of treatment. Treatment may include: Antiviral medicines and other medicines. Follow-up treatments every 6-12 months for infections or other liver conditions. Receiving a donated liver (liver transplant).  Follow these instructions at home: Medicines Take over-the-counter and prescription medicines only as told by your health care provider. Take your antiviral medicine as told by your health care provider. Do not stop taking the antiviral even if you start to feel better. Do not take any medicines unless approved by your health care provider, including over-the-counter medicines and birth control pills. Activity Rest as needed. Do not have sex unless approved by your health care provider. Ask your health care provider when you may return to school or work. Eating and drinking Eat a balanced diet with plenty of fruits and vegetables, whole grains, and lowfat (lean) meats or non-meat proteins (such as beans or tofu). Drink enough fluids to keep your urine clear or pale yellow. Do not drink alcohol. General instructions Do not share toothbrushes, nail clippers, or razors. Wash your hands frequently with soap and water. If soap and water are not available, use hand sanitizer. Cover any cuts or open sores on your skin to prevent spreading the virus.  Keep all follow-up visits as told by your health care provider. This is important. You may need follow-up visits  every 6-12 months. How is this prevented? There is no vaccine for hepatitis C. The only way to prevent the disease is to reduce the risk of exposure to the virus. Make sure you: Wash your hands frequently with soap and water. If soap and water are not available, use hand sanitizer. Do not share needles or syringes. Practice safe sex and use condoms. Avoid handling blood or bodily fluids without gloves or other protection. Avoid getting tattoos or piercings in shops or other locations that are not clean.  Contact a health care provider if: You have a fever. You develop abdominal pain. You pass dark urine. You pass clay-colored stools. You develop joint pain. Get help right away if: You have increasing fatigue or weakness. You lose your appetite. You cannot eat or drink without vomiting. You develop jaundice or your jaundice gets worse. You bruise or bleed easily. Summary Hepatitis C is a viral infection of the liver. It can lead to scarring of the liver (cirrhosis), liver failure, or liver cancer. The hepatitis C virus (HCV) causes this condition. The virus can pass from person to person (is contagious). You should not take any medicines unless approved by your health care provider. This includes over-the-counter medicines and birth control pills. This information is not intended to replace advice given to you by your health care provider. Make sure you discuss any questions you have with your health care provider. Document Released: 10/28/2000 Document Revised: 12/06/2016 Document Reviewed: 12/06/2016 Elsevier Interactive Patient Education  [employer].

## 2022-05-19 NOTE — Assessment & Plan Note (Signed)
Jordan [name] is a 65 y/o male with genotype 1a chronic Hepatitis C with initial RNA level of 921,000. 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 Asymptomatic and treatment naive. Reviewed the pathogenesis, transmission, risk if left untreated, treatment options and plan of care. Check lab work today including HIV and Hepatitis B status. Will plan for treatment with 8 weeks of [place] pending lab work results. Follow up in 1 month after start of medication.

## 2022-05-24 LAB — CBC
HCT: 48.6 % (ref 38.5–50.0)
Hemoglobin: 16.9 g/dL (ref 13.2–17.1)
MCH: 30.3 pg (ref 27.0–33.0)
MCHC: 34.8 g/dL (ref 32.0–36.0)
MCV: 87.1 fL (ref 80.0–100.0)
MPV: 9.9 fL (ref 7.5–12.5)
Platelets: 240 10*3/uL (ref 140–400)
RBC: 5.58 10*6/uL (ref 4.20–5.80)
RDW: 12.5 % (ref 11.0–15.0)
WBC: 4.6 10*3/uL (ref 3.8–10.8)

## 2022-05-24 LAB — LIVER FIBROSIS, FIBROTEST-ACTITEST
ALT: 38 U/L (ref 9–46)
Alpha-2-Macroglobulin: 366 mg/dL — ABNORMAL HIGH (ref 106–279)
Apolipoprotein A1: 134 mg/dL (ref 94–176)
Bilirubin: 0.4 mg/dL (ref 0.2–1.2)
Fibrosis Score: 0.57
GGT: 26 U/L (ref 3–70)
Haptoglobin: 141 mg/dL (ref 43–212)
Necroinflammat ACT Score: 0.28
Reference ID: 4448663

## 2022-05-24 LAB — HEPATITIS B SURFACE ANTIBODY, QUANTITATIVE: Hep B S AB Quant (Post): <5 m[IU]/mL — ABNORMAL LOW (ref >=10)

## 2022-05-24 LAB — PROTIME-INR
INR: 1
Prothrombin Time: 10.9 s (ref 9.0–11.5)

## 2022-05-24 LAB — HEPATITIS B SURFACE ANTIGEN: Hepatitis B Surface Ag: NONREACTIVE

## 2022-05-24 LAB — HIV ANTIBODY (ROUTINE TESTING W REFLEX): HIV 1&2 Ab, 4th Generation: NONREACTIVE

## 2022-05-27 ENCOUNTER — Telehealth: Payer: Self-pay

## 2022-05-27 ENCOUNTER — Other Ambulatory Visit (HOSPITAL_COMMUNITY): Payer: Self-pay

## 2022-05-27 ENCOUNTER — Other Ambulatory Visit: Payer: Self-pay

## 2022-05-27 NOTE — Telephone Encounter (Signed)
[facility] Patient Advocate Encounter   Received notification from [facility] that prior authorization for Mavyret is required.   PA submitted on 05/27/22 Key [id number] Status is pending    [facility] will continue to follow.   Ileene Patrick, [facility] Patient [facility] for Infectious Disease Phone: [phone] Fax:  [phone]

## 2022-05-27 NOTE — Progress Notes (Signed)
Started PA

## 2022-05-30 ENCOUNTER — Telehealth: Payer: Self-pay

## 2022-05-30 NOTE — Telephone Encounter (Signed)
[facility] Patient Advocate Encounter  Prior Authorization for Mavyret has been approved.    PA# [id number] Effective dates: 05/27/22 through 07/22/22  Patients co-pay is $0.00.   Prescription can be filled at [facility].  [facility] will continue to follow.  Ileene Patrick, [facility] Patient [facility] for Infectious Disease Phone: [phone] Fax:  [phone]

## 2022-05-31 ENCOUNTER — Telehealth: Payer: Self-pay | Admitting: Pharmacist

## 2022-05-31 ENCOUNTER — Other Ambulatory Visit: Payer: Self-pay | Admitting: Pharmacist

## 2022-05-31 ENCOUNTER — Other Ambulatory Visit (HOSPITAL_COMMUNITY): Payer: Self-pay

## 2022-05-31 DIAGNOSIS — B182 Chronic viral hepatitis C: Secondary | ICD-10-CM

## 2022-05-31 MED ORDER — MAVYRET 100-40 MG PO TABS
3.0000 | ORAL_TABLET | Freq: Every day | ORAL | 1 refills | Status: DC
  Filled 2022-05-31: qty 84, 28d supply, fill #0
  Filled 2022-05-31: qty 84, fill #0
  Filled 2022-06-20: qty 84, 28d supply, fill #1

## 2022-05-31 NOTE — Telephone Encounter (Signed)
Patient is approved to receive Mavyret x 8 weeks for chronic Hepatitis C infection. Counseled patient to take all three tablets of Mavyret daily with food.  Counseled patient the need to take all three tablets together and to not separate them out during the day. Encouraged patient not to miss any doses and explained how their chance of cure could go down with each dose missed. Counseled patient on what to do if dose is missed - if it is closer to the missed dose take immediately; if closer to next dose then skip dose and take the next dose at the usual time. Counseled patient on common side effects such as headache, fatigue, and nausea and that these normally decrease with time. I reviewed patient medications and found no drug interactions. Discussed with patient that there are several drug interactions with Mavyret and instructed patient to call the clinic if he wishes to start a new medication during course of therapy. Also advised patient to call if he experiences any side effects. Patient will follow-up with Cassie in the pharmacy clinic on 8/16.  Alfonse Spruce, PharmD, CPP, [place] Clinical Pharmacist Practitioner Infectious [place] for Infectious Disease

## 2022-06-03 ENCOUNTER — Other Ambulatory Visit (HOSPITAL_COMMUNITY): Payer: Self-pay

## 2022-06-20 ENCOUNTER — Other Ambulatory Visit (HOSPITAL_COMMUNITY): Payer: Self-pay

## 2022-06-29 ENCOUNTER — Other Ambulatory Visit (HOSPITAL_COMMUNITY): Payer: Self-pay

## 2022-06-29 ENCOUNTER — Other Ambulatory Visit: Payer: Self-pay

## 2022-06-29 ENCOUNTER — Ambulatory Visit (INDEPENDENT_AMBULATORY_CARE_PROVIDER_SITE_OTHER): Payer: Medicare Other | Admitting: Pharmacist

## 2022-06-29 ENCOUNTER — Other Ambulatory Visit: Payer: Self-pay | Admitting: Critical Care Medicine

## 2022-06-29 DIAGNOSIS — B182 Chronic viral hepatitis C: Secondary | ICD-10-CM

## 2022-06-29 MED ORDER — SILDENAFIL CITRATE 100 MG PO TABS
100.0000 mg | ORAL_TABLET | Freq: Every day | ORAL | 1 refills | Status: DC | PRN
  Filled 2022-06-29: qty 10, 30d supply, fill #0

## 2022-06-29 NOTE — Progress Notes (Signed)
HPI: Jordan [name] is a 66 y.o. male who presents to the [place] clinic for Hepatitis C follow-up.  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   AMLODIPINE (NORVASC) 10 MG TABLET    Take 1 tablet (10 mg total) by mouth daily.   BUDESONIDE-FORMOTEROL (SYMBICORT) 160-4.5 MCG/ACT INHALER    Inhale 2 puffs into the lungs in the morning and at bedtime.   GLECAPREVIR-PIBRENTASVIR (MAVYRET) 100-40 MG TABS    Take 3 tablets by mouth daily with breakfast.   MULTIPLE VITAMIN (MULTIVITAMIN ADULT PO)    Take by mouth.   OMEPRAZOLE (PRILOSEC) 40 MG CAPSULE    Take 1 capsule (40 mg total) by mouth daily before dinner.   SILDENAFIL (VIAGRA) 100 MG TABLET    Take 1 tablet (100 mg total) by mouth daily as needed for erectile dysfunction.   VALSARTAN-HYDROCHLOROTHIAZIDE (DIOVAN-HCT) 160-25 MG TABLET    TAKE 1 TABLET BY MOUTH DAILY.  Modified Medications   No medications on file  Discontinued Medications   No medications on file    Allergies: No Known Allergies  Past Medical History: Past Medical History:  Diagnosis Date   Allergy    seasonal   Aneurysm of  infrarenal abdominal aorta (HCC)    3.4 cm per angela mcclung pa 04-16-2020 office note   Colon cancer ([place])    COPD (chronic obstructive pulmonary disease) ([place])    Family history of adverse reaction to anesthesia    mother slow to awaken   GERD (gastroesophageal reflux disease)    Hepatitis    hepatitis c dx in remission per pt   Hypertension    Inguinal hernia    Multiple fractures 06/04/2020   Positive colorectal cancer screening using Cologuard test 08/27/2020   Substance abuse ([place])    Wears glasses    for reading    Social History: Social History   Socioeconomic History   Marital status: Divorced    Spouse name: Not on file   Number of children: Not on file   Years of education: Not on file   Highest education level: Not on file  Occupational History   Not on file  Tobacco Use   Smoking status: Former    Types: Cigarettes    Quit date: 03/14/2020    Years since quitting: 2.2   Smokeless tobacco: Never  Vaping Use   Vaping Use: Never used  Substance and Sexual Activity   Alcohol use: Not Currently    Alcohol/week: 1.0 standard drink of alcohol    Types: 1 Cans of beer per week    Comment: quit 2000   Drug use: Yes  Frequency: 1.0 times per week    Types: Cocaine   Sexual activity: Not on file  Other Topics Concern   Not on file  Social History Narrative   Not on file   Social Determinants of Health   Financial Resource Strain: Medium Risk (06/02/2021)   Overall Financial Resource Strain (CARDIA)    Difficulty of Paying Living Expenses: Somewhat hard  Food Insecurity: No Food Insecurity (06/02/2021)   Hunger Vital Sign    Worried About Running Out of Food in the Last Year: Never true    Ran Out of Food in the Last Year: Never true  Transportation Needs: No Transportation Needs (06/02/2021)   PRAPARE - Hydrologist (Medical): No    Lack of Transportation (Non-Medical): No  Physical Activity: Sufficiently Active (06/02/2021)    Exercise Vital Sign    Days of Exercise per Week: 5 days    Minutes of Exercise per Session: 30 min  Stress: Stress Concern Present (06/02/2021)   [place]    Feeling of Stress : To some extent  Social Connections: Moderately Isolated (06/02/2021)   Social Connection and Isolation Panel [NHANES]    Frequency of Communication with Friends and Family: More than three times a week    Frequency of Social Gatherings with Friends and Family: Once a week    Attends Religious Services: Never    Marine scientist or Organizations: Yes    Attends Music therapist: More than 4 times per year    Marital Status: Divorced    Labs: Hepatitis C Lab Results  Component Value Date   HCVGENOTYPE Comment 05/10/2022   FIBROSTAGE F2 05/19/2022   Hepatitis B Lab Results  Component Value Date   HEPBSAG NON-REACTIVE 05/19/2022   Hepatitis A No results found for: "HAV" HIV Lab Results  Component Value Date   HIV NON-REACTIVE 05/19/2022   HIV Non Reactive 04/04/2020   Lab Results  Component Value Date   CREATININE 1.20 01/10/2022   CREATININE 1.54 (H) 01/18/2021   CREATININE 1.05 04/06/2020   CREATININE 1.00 04/04/2020   CREATININE 1.20 04/03/2020   Lab Results  Component Value Date   AST 39 05/10/2022   AST 39 01/10/2022   AST 36 01/18/2021   ALT 38 05/19/2022   ALT 42 05/10/2022   ALT 50 (H) 01/10/2022   INR 1.0 05/19/2022   INR 1.0 04/03/2020   INR 1.0 08/01/2009    Assessment: Jordan [name] presents to clinic today for HCV follow-up. He has been taking Mavyret for 4 weeks and is tolerating the medicine well besides one day of headache and dizziness when he first started. He has not missed any doses. Will check HCV RNA today and follow-up in 4 weeks. Patient eligible for hepatitis B vaccination which he deferred today. Has not received second box yet; confirmed with Darden Dates that it will be sent out tomorrow from  [facility].    Plan: Check HCV RNA Continue Mavyret x 4 weeks Follow-up with Marya Amsler at [facility]   Alfonse Spruce, PharmD, CPP, [place] Clinical Pharmacist Practitioner Infectious Diseases [place] for Infectious Disease 06/29/2022, 3:16 PM

## 2022-06-30 ENCOUNTER — Other Ambulatory Visit (HOSPITAL_COMMUNITY): Payer: Self-pay

## 2022-07-01 LAB — HEPATITIS C RNA QUANTITATIVE
HCV Quantitative Log: <1.18 log IU/mL — ABNORMAL HIGH
HCV RNA, PCR, QN: <15 IU/mL — ABNORMAL HIGH

## 2022-07-21 ENCOUNTER — Other Ambulatory Visit (HOSPITAL_COMMUNITY): Payer: Self-pay

## 2022-07-25 ENCOUNTER — Other Ambulatory Visit (HOSPITAL_COMMUNITY): Payer: Self-pay

## 2022-08-01 ENCOUNTER — Other Ambulatory Visit: Payer: Self-pay

## 2022-08-01 ENCOUNTER — Ambulatory Visit (INDEPENDENT_AMBULATORY_CARE_PROVIDER_SITE_OTHER): Payer: Medicare Other | Admitting: Family

## 2022-08-01 ENCOUNTER — Encounter: Payer: Self-pay | Admitting: Family

## 2022-08-01 VITALS — BP 119/75 | HR 76 | Temp 97.9°F | Resp 16 | Ht 71.75 in | Wt 168.4 lb

## 2022-08-01 DIAGNOSIS — B182 Chronic viral hepatitis C: Secondary | ICD-10-CM | POA: Diagnosis not present

## 2022-08-01 NOTE — Progress Notes (Signed)
Subjective:    Patient ID: Jordan [name], male    DOB: 1956-08-02, 66 y.o.   MRN: [id number]  Chief Complaint  Patient presents with   Follow-up    Chronic hepatitis C without hepatic coma     HPI:  Jordan [name] is a 66 y.o. male with chronic Hepatitis C last seen on 06/29/22 by Alfonse Spruce, PharmD, CPP with good adherence and tolerance to [place]. Hepatitis C RNA level was undetectable and was continued on Mavyret to complete remaining 4 weeks. Here today for follow up and end of treatment visit.   Doing well since he was last seen. Finished medication on 07/30/22 with occasional nausea and episode of vomiting otherwise tolerated well. Denies abdominal pain, nausea, vomiting, fatigue, fever, scleral icterus or jaundice.   No Known Allergies    Outpatient Medications Prior to Visit  Medication Sig Dispense Refill   Albuterol Sulfate (PROAIR RESPICLICK) 833 (90 Base) MCG/ACT AEPB INHALE 2 PUFFS INTO THE LUNGS EVERY 6 (SIX) HOURS AS NEEDED. 1 each 4   amLODipine (NORVASC) 10 MG tablet Take 1 tablet (10 mg total) by mouth daily. 90 tablet 3   budesonide-formoterol (SYMBICORT) 160-4.5 MCG/ACT inhaler Inhale 2 puffs into the lungs in the morning and at bedtime. 10.2 g 11   Multiple Vitamin (MULTIVITAMIN ADULT PO) Take by mouth.     omeprazole (PRILOSEC) 40 MG capsule Take 1 capsule (40 mg total) by mouth daily before dinner. 60 capsule 3   sildenafil (VIAGRA) 100 MG tablet Take 1 tablet (100 mg total) by mouth daily as needed for erectile dysfunction. 10 tablet 1   valsartan-hydrochlorothiazide (DIOVAN-HCT) 160-25 MG tablet TAKE 1 TABLET BY MOUTH DAILY. 90 tablet 1   Glecaprevir-Pibrentasvir (MAVYRET) 100-40 MG TABS Take 3 tablets by mouth daily with breakfast. 84 tablet 1   No facility-administered medications prior to visit.     Past Medical History:  Diagnosis Date   Allergy    seasonal   Aneurysm of infrarenal abdominal aorta (HCC)    3.4 cm per angela mcclung pa  04-16-2020 office note   Colon cancer ([place])    COPD (chronic obstructive pulmonary disease) ([place])    Family history of adverse reaction to anesthesia    mother slow to awaken   GERD (gastroesophageal reflux disease)    Hepatitis    hepatitis c dx in remission per pt   Hypertension    Inguinal hernia    Multiple fractures 06/04/2020   Positive colorectal cancer screening using Cologuard test 08/27/2020   Substance abuse ([place])    Wears glasses    for reading     Past Surgical History:  Procedure Laterality Date   COLONOSCOPY  11/11/2020   ENDOSCOPIC MUCOSAL RESECTION N/A 03/10/2021   Procedure: ENDOSCOPIC MUCOSAL RESECTION;  Surgeon: Irving Copas., MD;  Location: [facility];  Service: Gastroenterology;  Laterality: N/A;   FLEXIBLE SIGMOIDOSCOPY N/A 03/10/2021   Procedure: FLEXIBLE SIGMOIDOSCOPY;  Surgeon: Rush Landmark Telford Nab., MD;  Location: [facility];  Service: Gastroenterology;  Laterality: N/A;   HEMOSTASIS CLIP PLACEMENT  03/10/2021   Procedure: HEMOSTASIS CLIP PLACEMENT;  Surgeon: Irving Copas., MD;  Location: [facility];  Service: Gastroenterology;;   INGUINAL HERNIA REPAIR Right 01/19/2021   Procedure: LAPAROSCOPIC RIGHT INGUINAL HERNIA REPAIR WITH MESH;  Surgeon: Stechschulte, Nickola Major, MD;  Location: [facility];  Service: General;  Laterality: Right;   obstruction removed as a child   swallowed a peanut   POLYPECTOMY  03/10/2021   Procedure:  POLYPECTOMY;  Surgeon: Rush Landmark Telford Nab., MD;  Location: [facility];  Service: Gastroenterology;;   POLYPECTOMY     SUBMUCOSAL LIFTING INJECTION  03/10/2021   Procedure: SUBMUCOSAL LIFTING INJECTION;  Surgeon: Irving Copas., MD;  Location: [facility];  Service: Gastroenterology;;       Review of Systems  Constitutional:  Negative for chills, diaphoresis, fatigue and fever.  Respiratory:  Negative for cough, chest tightness, shortness of breath and wheezing.    Cardiovascular:  Negative for chest pain.  Gastrointestinal:  Negative for abdominal distention, abdominal pain, constipation, diarrhea, nausea and vomiting.  Neurological:  Negative for weakness and headaches.  Hematological:  Does not bruise/bleed easily.      Objective:    BP 119/75   Pulse 76   Temp 97.9 F (36.6 C) (Oral)   Resp 16   Ht 5' 11.75" (1.822 m)   Wt 168 lb 6.4 oz (76.4 kg)   SpO2 96%   BMI 23.00 kg/m  Nursing note and vital signs reviewed.  Physical Exam Constitutional:      General: He is not in acute distress.    Appearance: He is well-developed.  Cardiovascular:     Rate and Rhythm: Normal rate and regular rhythm.     Heart sounds: Normal heart sounds. No murmur heard.    No friction rub. No gallop.  Pulmonary:     Effort: Pulmonary effort is normal. No respiratory distress.     Breath sounds: Normal breath sounds. No wheezing or rales.  Chest:     Chest wall: No tenderness.  Abdominal:     General: Bowel sounds are normal. There is no distension.     Palpations: Abdomen is soft. There is no mass.     Tenderness: There is no abdominal tenderness. There is no guarding or rebound.  Skin:    General: Skin is warm and dry.  Neurological:     Mental Status: He is alert and oriented to person, place, and time.  Psychiatric:        Behavior: Behavior normal.        Thought Content: Thought content normal.        Judgment: Judgment normal.         08/01/2022    1:36 PM 05/19/2022   10:39 AM 05/10/2022    2:23 PM 01/10/2022    1:43 PM 09/07/2021    1:42 PM  Depression screen PHQ 2/9  Decreased Interest 0 0 0 0 0  Down, Depressed, Hopeless 0 0 0 0 0  PHQ - 2 Score 0 0 0 0 0  Altered sleeping    0   Tired, decreased energy    1   Change in appetite    0   Feeling bad or failure about yourself     0   Trouble concentrating    0   Moving slowly or fidgety/restless    0   Suicidal thoughts    0   PHQ-9 Score    1        Assessment & Plan:     Patient Active Problem List   Diagnosis Date Noted   Chronic hepatitis C without hepatic coma ([place]) 05/19/2022   Scalp lesion 05/10/2022   Tick bite 05/10/2022   Adenocarcinoma of colon ([place]) 01/19/2021   Dental caries 11/23/2020   Unilateral inguinal hernia without obstruction or gangrene 10/27/2020   Erectile dysfunction 08/11/2020   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 Reviewed previous lab work and discussed plan of care. Check Hepatitis C RNA. Plan for follow up in 3 months or sooner if needed with lab work on the same day.      Relevant Orders   Hepatitis C RNA quantitative     I have discontinued Jeneen Rinks B. Mondo's Mavyret. I am also having him maintain his Multiple Vitamin (MULTIVITAMIN ADULT PO), amLODipine, Symbicort, omeprazole, valsartan-hydrochlorothiazide, Albuterol Sulfate, and sildenafil.    Follow-up: Return in about 3 months (around 10/31/2022), or if symptoms worsen or fail to improve.   Terri Piedra, MSN, FNP-C Nurse Practitioner [facility] for Infectious Disease [place] number: [phone]

## 2022-08-01 NOTE — Assessment & Plan Note (Signed)
Jordan [name] has completed 8 weeks of [place] with good adherence and tolerance. Reviewed previous lab work and discussed plan of care. Check Hepatitis C RNA. Plan for follow up in 3 months or sooner if needed with lab work on the same day.

## 2022-08-01 NOTE — Patient Instructions (Signed)
Nice to see you.  We will check your lab work today.  Plan for follow up in 3 months or sooner if needed with lab work on the same day.  Have a great day and stay safe!

## 2022-08-03 LAB — HEPATITIS C RNA QUANTITATIVE
HCV Quantitative Log: <1.18 log IU/mL
HCV RNA, PCR, QN: <15 IU/mL

## 2022-08-04 ENCOUNTER — Telehealth: Payer: Self-pay

## 2022-08-04 NOTE — Telephone Encounter (Signed)
-----   Message from [place], [place] sent at 08/04/2022  9:00 AM EDT ----- Please inform Jordan [name] that his Hepatitis C viral load is undetectable and will proceed with re-checking in 3 months as discussed.

## 2022-08-04 NOTE — Telephone Encounter (Signed)
Staff tempted to contact patient, no answer and patient does not have an identifiable voicemail.   Will send patient a Mychart message to make aware of results and recommended follow up in 3 months.  Eugenia Mcalpine, LPN

## 2022-08-04 NOTE — Telephone Encounter (Signed)
-----   Message from [facility], [place] sent at 08/04/2022  9:00 AM EDT ----- Please inform Mr. Hargadon that his Hepatitis C viral load is undetectable and will proceed with re-checking in 3 months as discussed.

## 2022-08-08 ENCOUNTER — Other Ambulatory Visit: Payer: Self-pay

## 2022-08-12 ENCOUNTER — Other Ambulatory Visit: Payer: Self-pay

## 2022-08-30 ENCOUNTER — Other Ambulatory Visit: Payer: Self-pay

## 2022-09-01 ENCOUNTER — Other Ambulatory Visit: Payer: Self-pay

## 2022-09-26 ENCOUNTER — Other Ambulatory Visit: Payer: Self-pay

## 2022-10-09 NOTE — Progress Notes (Deleted)
Established Patient Office Visit  Subjective:  Patient ID: Jordan [name], male    DOB: 03/14/1956  Age: 66 y.o. MRN: [id number]  CC:  Chief Complaint  Patient presents with   Back Pain    HPI Jordan [name] presents for HTN, COPD, colon CA HEP C f/u.  01/10/22 On arrival blood pressure 140/80 on recheck.  Patient had seen gastroenterology last fall undergone repeat colonoscopy and had no evidence of cancer recurrence.  He is to have repeat colonoscopy in 2 years.  Patient's breathing is at baseline he is on the Symbicort and albuterol patient maintains amlodipine and valsartan HCT for blood pressure.  He uses Viagra for erectile dysfunction.  Hepatitis C has cleared.  He has minimal cough at this time.  He does have poor dentition and needs to have these teeth removed in he does plan on obtaining a dental evaluation for same.  Patient did receive the flu vaccine at this visit.  There are no other complaints.  He does continue to have erectile dysfunction uses the Viagra.  6/27 Patient seen in return follow-up is complaining of a new lesion on the top of his scalp and would like refills on Prilosec and Viagra.  He states the Viagra was helpful with erectile dysfunction.  He also had a tick bite in his lower abdomen on the right with an associated rash this occurred about a week ago.  Patient does have history of hepatitis C and never saw hepatology clinic.  He had mild elevation liver function positive hepatitis C antibody with 42,000 viral load in 2021.  Gastroenterology was to assess this but I think they were focused primarily on his colon cancer.  Patient will need to have hepatitis C reassayed.  Patient is no longer smoking.  His inhalers are working well he is having no breathing difficulties.  There are no other complaints.  Blood pressure is at goal at this visit 130/74 on the amlodipine and valsartan HCT Patient has poor dentition and is waiting on dental  appointment  11/28  flu, AWV  hep c neg   COPD with chronic bronchitis ([place])    COPD chronic bronchitis continue inhaled medicines      Relevant Medications   budesonide-formoterol (SYMBICORT) 160-4.5 MCG/ACT inhaler   Albuterol Sulfate (PROAIR RESPICLICK) 627 (90 Base) MCG/ACT AEPB     Digestive   Dental caries    Encouraged to keep dental appointment      Adenocarcinoma of colon ([place])    Stable at this time repeat colonoscopy December 2024        Musculoskeletal and Integument   Scalp lesion    Referral to dermatology made this looks like a squamous cell cancer      Relevant Orders   Ambulatory referral to Dermatology   Tick bite    With surrounding rash we will assess Lyme assay      Relevant Orders   Lyme Disease Serology w/Reflex     Other   Hepatitis C antibody positive in blood - Primary    Hepatitis C will need to be reassayed he may need Epclusa      Relevant Orders   HCV RNA quant rflx ultra or genotyp([facility])   Hepatic Function Panel   Erectile dysfunction    Renew Viagra      Past Medical History:  Diagnosis Date   Allergy    seasonal   Aneurysm of infrarenal abdominal aorta (HCC)    3.4 cm per angela mcclung  pa 04-16-2020 office note   Colon cancer [facility])    COPD (chronic obstructive pulmonary disease) ([place])    Family history of adverse reaction to anesthesia    mother slow to awaken   GERD (gastroesophageal reflux disease)    Hepatitis    hepatitis c dx in remission per pt   Hypertension    Inguinal hernia    Multiple fractures 06/04/2020   Positive colorectal cancer screening using Cologuard test 08/27/2020   Substance abuse ([place])    Wears glasses    for reading    Past Surgical History:  Procedure Laterality Date   COLONOSCOPY  11/11/2020   ENDOSCOPIC MUCOSAL RESECTION N/A 03/10/2021   Procedure: ENDOSCOPIC MUCOSAL RESECTION;  Surgeon: Irving Copas., MD;  Location: [facility];  Service: Gastroenterology;   Laterality: N/A;   FLEXIBLE SIGMOIDOSCOPY N/A 03/10/2021   Procedure: FLEXIBLE SIGMOIDOSCOPY;  Surgeon: Rush Landmark Telford Nab., MD;  Location: [facility];  Service: Gastroenterology;  Laterality: N/A;   HEMOSTASIS CLIP PLACEMENT  03/10/2021   Procedure: HEMOSTASIS CLIP PLACEMENT;  Surgeon: Irving Copas., MD;  Location: [facility];  Service: Gastroenterology;;   INGUINAL HERNIA REPAIR Right 01/19/2021   Procedure: LAPAROSCOPIC RIGHT INGUINAL HERNIA REPAIR WITH MESH;  Surgeon: Stechschulte, Nickola Major, MD;  Location: [place];  Service: General;  Laterality: Right;   obstruction removed as a child   swallowed a peanut   POLYPECTOMY  03/10/2021   Procedure: POLYPECTOMY;  Surgeon: Mansouraty, Telford Nab., MD;  Location: [facility];  Service: Gastroenterology;;   POLYPECTOMY     SUBMUCOSAL LIFTING INJECTION  03/10/2021   Procedure: SUBMUCOSAL LIFTING INJECTION;  Surgeon: Irving Copas., MD;  Location: [facility];  Service: Gastroenterology;;    Family History  Problem Relation Age of Onset   Cancer Sister    Colon polyps Sister    Colon polyps Father    Stomach cancer Father 87   Colon polyps Brother    Colon cancer Neg Hx    Esophageal cancer Neg Hx    Rectal cancer Neg Hx    Inflammatory bowel disease Neg Hx    Liver disease Neg Hx    Pancreatic cancer Neg Hx     Social History   Socioeconomic History   Marital status: Divorced    Spouse name: Not on file   Number of children: Not on file   Years of education: Not on file   Highest education level: Not on file  Occupational History   Not on file  Tobacco Use   Smoking status: Former    Types: Cigarettes    Quit date: 03/14/2020    Years since quitting: 2.5   Smokeless tobacco: Never  Vaping Use   Vaping Use: Never used  Substance and Sexual Activity   Alcohol use: Not Currently    Alcohol/week: 1.0 standard drink of alcohol    Types: 1 Cans of beer per week    Comment: quit 2000    Drug use: Yes    Frequency: 1.0 times per week    Types: Cocaine   Sexual activity: Not on file  Other Topics Concern   Not on file  Social History Narrative   Not on file   Social Determinants of Health   Financial Resource Strain: Medium Risk (06/02/2021)   Overall Financial Resource Strain (CARDIA)    Difficulty of Paying Living Expenses: Somewhat hard  Food Insecurity: No Food Insecurity (06/02/2021)   Hunger Vital Sign    Worried About Running Out of  Food in the Last Year: Never true    [place] in the Last Year: Never true  Transportation Needs: No Transportation Needs (06/02/2021)   PRAPARE - Hydrologist (Medical): No    Lack of Transportation (Non-Medical): No  Physical Activity: Sufficiently Active (06/02/2021)   Exercise Vital Sign    Days of Exercise per Week: 5 days    Minutes of Exercise per Session: 30 min  Stress: Stress Concern Present (06/02/2021)   [place]    Feeling of Stress : To some extent  Social Connections: Moderately Isolated (06/02/2021)   Social Connection and Isolation Panel [NHANES]    Frequency of Communication with Friends and Family: More than three times a week    Frequency of Social Gatherings with Friends and Family: Once a week    Attends Religious Services: Never    Marine scientist or Organizations: Yes    Attends Music therapist: More than 4 times per year    Marital Status: Divorced  Intimate Partner Violence: Not At Risk (06/02/2021)   Humiliation, Afraid, Rape, and Kick questionnaire    Fear of Current or Ex-Partner: No    Emotionally Abused: No    Physically Abused: No    Sexually Abused: No    Outpatient Medications Prior to Visit  Medication Sig Dispense Refill   Albuterol Sulfate (PROAIR RESPICLICK) 673 (90 Base) MCG/ACT AEPB INHALE 2 PUFFS INTO THE LUNGS EVERY 6 (SIX) HOURS AS NEEDED. 1 each 4    amLODipine (NORVASC) 10 MG tablet Take 1 tablet (10 mg total) by mouth daily. 90 tablet 3   budesonide-formoterol (SYMBICORT) 160-4.5 MCG/ACT inhaler Inhale 2 puffs into the lungs in the morning and at bedtime. 10.2 g 11   Multiple Vitamin (MULTIVITAMIN ADULT PO) Take by mouth.     omeprazole (PRILOSEC) 40 MG capsule Take 1 capsule (40 mg total) by mouth daily before dinner. 60 capsule 3   sildenafil (VIAGRA) 100 MG tablet Take 1 tablet (100 mg total) by mouth daily as needed for erectile dysfunction. 10 tablet 1   valsartan-hydrochlorothiazide (DIOVAN-HCT) 160-25 MG tablet TAKE 1 TABLET BY MOUTH DAILY. 90 tablet 1   No facility-administered medications prior to visit.    No Known Allergies  ROS Review of Systems  Constitutional: Negative.   HENT:  Positive for dental problem. Negative for ear pain, postnasal drip, rhinorrhea, sinus pressure, sore throat, trouble swallowing and voice change.   Eyes: Negative.   Respiratory: Negative.  Negative for apnea, cough, choking, chest tightness, shortness of breath, wheezing and stridor.   Cardiovascular: Negative.  Negative for chest pain, palpitations and leg swelling.  Gastrointestinal: Negative.  Negative for abdominal distention, abdominal pain, nausea and vomiting.  Genitourinary: Negative.        Erectile dysfunction  Musculoskeletal: Negative.  Negative for arthralgias and myalgias.  Skin:  Negative for rash.       Lesion on the top of the scalp that is exophytic  Allergic/Immunologic: Negative.  Negative for environmental allergies and food allergies.  Neurological: Negative.  Negative for dizziness, syncope, weakness and headaches.  Hematological: Negative.  Negative for adenopathy. Does not bruise/bleed easily.  Psychiatric/Behavioral: Negative.  Negative for agitation and sleep disturbance. The patient is not nervous/anxious.       Objective:    Physical Exam Vitals reviewed.  Constitutional:      Appearance: Normal  appearance. He is well-developed. He is not  diaphoretic.  HENT:     Head: Normocephalic and atraumatic.     Nose: No nasal deformity, septal deviation, mucosal edema or rhinorrhea.     Right Sinus: No maxillary sinus tenderness or frontal sinus tenderness.     Left Sinus: No maxillary sinus tenderness or frontal sinus tenderness.     Mouth/Throat:     Mouth: Mucous membranes are moist.     Pharynx: Oropharynx is clear. No oropharyngeal exudate.     Comments: Multiple dental caries poor dentition Eyes:     General: No scleral icterus.    Conjunctiva/sclera: Conjunctivae normal.     Pupils: Pupils are equal, round, and reactive to light.  Neck:     Thyroid: No thyromegaly.     Vascular: No carotid bruit or JVD.     Trachea: Trachea normal. No tracheal tenderness or tracheal deviation.  Cardiovascular:     Rate and Rhythm: Normal rate and regular rhythm.     Chest Wall: PMI is not displaced.     Pulses: Normal pulses. No decreased pulses.     Heart sounds: Normal heart sounds, S1 normal and S2 normal. Heart sounds not distant. No murmur heard.    No systolic murmur is present.     No diastolic murmur is present.     No friction rub. No gallop. No S3 or S4 sounds.  Pulmonary:     Effort: No tachypnea, accessory muscle usage or respiratory distress.     Breath sounds: No stridor. No decreased breath sounds, wheezing, rhonchi or rales.  Chest:     Chest wall: No tenderness.  Abdominal:     General: Bowel sounds are normal. There is no distension.     Palpations: Abdomen is soft. Abdomen is not rigid.     Tenderness: There is no abdominal tenderness. There is no guarding or rebound.  Musculoskeletal:        General: Normal range of motion.     Cervical back: Normal range of motion and neck supple. No edema, erythema or rigidity. No muscular tenderness. Normal range of motion.  Lymphadenopathy:     Head:     Right side of head: No submental or submandibular adenopathy.     Left side  of head: No submental or submandibular adenopathy.     Cervical: No cervical adenopathy.  Skin:    General: Skin is warm and dry.     Coloration: Skin is not pale.     Findings: Lesion present. No rash.     Nails: There is no clubbing.     Comments: Exophytic lesion at the top of the scalp is worrisome for an early squamous cell cancer  Tick bite right lower abdomen with surrounding rash  Neurological:     Mental Status: He is alert and oriented to person, place, and time.     Sensory: No sensory deficit.  Psychiatric:        Speech: Speech normal.        Behavior: Behavior normal.     BP (!) 167/82 (BP Location: Left Arm, Patient Position: Sitting, Cuff Size: Normal)   Pulse 75   Temp 98.2 F (36.8 C) (Oral)   Resp 16   Ht 5' 11.75" (1.822 m)   Wt 177 lb (80.3 kg)   SpO2 99%   BMI 24.17 kg/m  Wt Readings from Last 3 Encounters:  10/11/22 177 lb (80.3 kg)  08/01/22 168 lb 6.4 oz (76.4 kg)  05/19/22 169 lb 3.2 oz (76.7 kg)  Health Maintenance Due  Topic Date Due   Medicare Annual Wellness (AWV)  06/02/2022   INFLUENZA VACCINE  Never done     There are no preventive care reminders to display for this patient.  No results found for: "TSH" Lab Results  Component Value Date   WBC 4.6 05/19/2022   HGB 16.9 05/19/2022   HCT 48.6 05/19/2022   MCV 87.1 05/19/2022   PLT 240 05/19/2022   Lab Results  Component Value Date   NA 142 01/10/2022   K 4.7 01/10/2022   CO2 23 01/10/2022   GLUCOSE 97 01/10/2022   BUN 22 01/10/2022   CREATININE 1.20 01/10/2022   BILITOT 0.2 05/10/2022   ALKPHOS 73 05/10/2022   AST 39 05/10/2022   ALT 38 05/19/2022   PROT 7.0 05/10/2022   ALBUMIN 4.0 05/10/2022   CALCIUM 9.4 01/10/2022   ANIONGAP 8 01/18/2021   EGFR 67 01/10/2022   Lab Results  Component Value Date   CHOL 164 01/10/2022   Lab Results  Component Value Date   HDL 38 (L) 01/10/2022   Lab Results  Component Value Date   LDLCALC 84 01/10/2022   Lab Results   Component Value Date   TRIG 250 (H) 01/10/2022   Lab Results  Component Value Date   CHOLHDL 4.3 01/10/2022   No results found for: "HGBA1C"    Assessment & Plan:   Problem List Items Addressed This Visit   None No orders of the defined types were placed in this encounter.   Follow-up: No follow-ups on file.    Asencion Noble, MD

## 2022-10-11 ENCOUNTER — Ambulatory Visit (INDEPENDENT_AMBULATORY_CARE_PROVIDER_SITE_OTHER): Payer: Medicare Other | Admitting: Family

## 2022-10-11 ENCOUNTER — Other Ambulatory Visit: Payer: Self-pay

## 2022-10-11 ENCOUNTER — Encounter: Payer: Self-pay | Admitting: Critical Care Medicine

## 2022-10-11 ENCOUNTER — Ambulatory Visit: Payer: Medicare Other | Attending: Critical Care Medicine | Admitting: Critical Care Medicine

## 2022-10-11 ENCOUNTER — Encounter: Payer: Self-pay | Admitting: Family

## 2022-10-11 VITALS — BP 167/82 | HR 75 | Temp 98.2°F | Resp 16 | Ht 71.75 in | Wt 177.0 lb

## 2022-10-11 VITALS — BP 122/80 | HR 74 | Temp 97.8°F

## 2022-10-11 DIAGNOSIS — B182 Chronic viral hepatitis C: Secondary | ICD-10-CM | POA: Diagnosis not present

## 2022-10-11 DIAGNOSIS — F17210 Nicotine dependence, cigarettes, uncomplicated: Secondary | ICD-10-CM | POA: Diagnosis not present

## 2022-10-11 DIAGNOSIS — Z0000 Encounter for general adult medical examination without abnormal findings: Secondary | ICD-10-CM | POA: Diagnosis not present

## 2022-10-11 DIAGNOSIS — I10 Essential (primary) hypertension: Secondary | ICD-10-CM | POA: Insufficient documentation

## 2022-10-11 DIAGNOSIS — Z23 Encounter for immunization: Secondary | ICD-10-CM

## 2022-10-11 DIAGNOSIS — J4489 Other specified chronic obstructive pulmonary disease: Secondary | ICD-10-CM | POA: Diagnosis not present

## 2022-10-11 DIAGNOSIS — I739 Peripheral vascular disease, unspecified: Secondary | ICD-10-CM | POA: Insufficient documentation

## 2022-10-11 DIAGNOSIS — C189 Malignant neoplasm of colon, unspecified: Secondary | ICD-10-CM

## 2022-10-11 DIAGNOSIS — K029 Dental caries, unspecified: Secondary | ICD-10-CM

## 2022-10-11 DIAGNOSIS — Z87891 Personal history of nicotine dependence: Secondary | ICD-10-CM

## 2022-10-11 DIAGNOSIS — R6889 Other general symptoms and signs: Secondary | ICD-10-CM | POA: Diagnosis not present

## 2022-10-11 DIAGNOSIS — L989 Disorder of the skin and subcutaneous tissue, unspecified: Secondary | ICD-10-CM | POA: Diagnosis not present

## 2022-10-11 DIAGNOSIS — K0889 Other specified disorders of teeth and supporting structures: Secondary | ICD-10-CM | POA: Diagnosis not present

## 2022-10-11 MED ORDER — ALBUTEROL SULFATE 108 (90 BASE) MCG/ACT IN AEPB
INHALATION_SPRAY | RESPIRATORY_TRACT | 4 refills | Status: DC
Start: 2022-10-11 — End: 2023-07-04
  Filled 2022-10-11: qty 1, 25d supply, fill #0
  Filled 2022-12-13 (×3): qty 1, 25d supply, fill #1

## 2022-10-11 MED ORDER — BUDESONIDE-FORMOTEROL FUMARATE 160-4.5 MCG/ACT IN AERO
2.0000 | INHALATION_SPRAY | Freq: Two times a day (BID) | RESPIRATORY_TRACT | 11 refills | Status: DC
Start: 2022-10-11 — End: 2023-05-16
  Filled 2022-10-11: qty 10.2, 30d supply, fill #0
  Filled 2022-12-13 (×3): qty 10.2, 30d supply, fill #1

## 2022-10-11 MED ORDER — VALSARTAN-HYDROCHLOROTHIAZIDE 320-25 MG PO TABS
1.0000 | ORAL_TABLET | Freq: Every day | ORAL | 3 refills | Status: DC
  Filled 2022-10-11: qty 90, 90d supply, fill #0

## 2022-10-11 MED ORDER — OMEPRAZOLE 40 MG PO CPDR
40.0000 mg | DELAYED_RELEASE_CAPSULE | Freq: Every day | ORAL | 3 refills | Status: DC
Start: 2022-10-11 — End: 2023-01-11
  Filled 2022-10-11: qty 60, 60d supply, fill #0
  Filled 2022-12-13: qty 30, 30d supply, fill #0

## 2022-10-11 MED ORDER — SILDENAFIL CITRATE 100 MG PO TABS
100.0000 mg | ORAL_TABLET | Freq: Every day | ORAL | 3 refills | Status: AC | PRN
  Filled 2022-10-11: qty 10, 30d supply, fill #0
  Filled 2023-01-11: qty 10, 30d supply, fill #1

## 2022-10-11 MED ORDER — AMLODIPINE BESYLATE 10 MG PO TABS
10.0000 mg | ORAL_TABLET | Freq: Every day | ORAL | 3 refills | Status: DC
Start: 2022-10-11 — End: 2023-01-11
  Filled 2022-10-11 – 2023-01-11 (×2): qty 90, 90d supply, fill #0

## 2022-10-11 NOTE — Progress Notes (Signed)
Annual Wellness Visit     Patient: Jordan [name], Male    DOB: October 12, 1956, 66 y.o.   MRN: [id number]  Subjective  Chief Complaint  Patient presents with   Back Pain   Medicare Wellness    Jordan [name] is a 66 y.o. male who presents today for his Annual Wellness Visit. He reports consuming a low sodium diet. Home exercise routine includes walking 1/2 hrs per week. He generally feels fairly well. He reports sleeping well. He does have additional problems to discuss today.   Today the patient's main complaint is shortness of breath he is not been using his H[employer] regularly also has some low back pain after heavy lifting this has been treated with over-the-counter medications.  On arrival blood pressure is also elevated turns 67/82.  He is still smoking about 3 to 4 cigarettes daily.  He agrees to and received the flu vaccine at this visit.  He also complains of left lower extremity numbness and claudication with exercise.  He has no prior history of noted vascular disease.    Dental: Current dental problems patient has an appointment for dental care he needs multiple teeth extracted   Patient Active Problem List   Diagnosis Date Noted   Abnormal ankle brachial index (ABI) 10/11/2022   Chronic hepatitis C without hepatic coma ([place]) 05/19/2022   Scalp lesion 05/10/2022   Tick bite 05/10/2022   Adenocarcinoma of colon ([place]) 01/19/2021   Dental caries 11/23/2020   Unilateral inguinal hernia without obstruction or gangrene 10/27/2020   Erectile dysfunction 08/11/2020   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   Hypertension 06/04/2020   History of tobacco use 06/04/2020   Compression fracture of thoracic vertebra with routine healing 04/30/2020   Past Medical History:  Diagnosis Date   Allergy    seasonal   Aneurysm of infrarenal abdominal aorta (HCC)    3.4 cm per angela mcclung pa 04-16-2020 office note   Colon cancer ([place])    COPD (chronic obstructive pulmonary  disease) ([place])    Family history of adverse reaction to anesthesia    mother slow to awaken   GERD (gastroesophageal reflux disease)    Hepatitis    hepatitis c dx in remission per pt   Hypertension    Inguinal hernia    Multiple fractures 06/04/2020   Positive colorectal cancer screening using Cologuard test 08/27/2020   Substance abuse ([place])    Wears glasses    for reading   Past Surgical History:  Procedure Laterality Date   COLONOSCOPY  11/11/2020   ENDOSCOPIC MUCOSAL RESECTION N/A 03/10/2021   Procedure: ENDOSCOPIC MUCOSAL RESECTION;  Surgeon: Irving Copas., MD;  Location: [facility];  Service: Gastroenterology;  Laterality: N/A;   FLEXIBLE SIGMOIDOSCOPY N/A 03/10/2021   Procedure: FLEXIBLE SIGMOIDOSCOPY;  Surgeon: Rush Landmark Telford Nab., MD;  Location: [facility];  Service: Gastroenterology;  Laterality: N/A;   HEMOSTASIS CLIP PLACEMENT  03/10/2021   Procedure: HEMOSTASIS CLIP PLACEMENT;  Surgeon: Irving Copas., MD;  Location: [facility];  Service: Gastroenterology;;   INGUINAL HERNIA REPAIR Right 01/19/2021   Procedure: LAPAROSCOPIC RIGHT INGUINAL HERNIA REPAIR WITH MESH;  Surgeon: Stechschulte, Nickola Major, MD;  Location[facility];  Service: General;  Laterality: Right;   obstruction removed as a child   swallowed a peanut   POLYPECTOMY  03/10/2021   Procedure: POLYPECTOMY;  Surgeon: Mansouraty, Telford Nab., MD;  Location: [facility];  Service: Gastroenterology;;   POLYPECTOMY     SUBMUCOSAL LIFTING INJECTION  03/10/2021   Procedure: SUBMUCOSAL LIFTING INJECTION;  Surgeon: Irving Copas., MD;  Location: [facility];  Service: Gastroenterology;;   Social History   Tobacco Use   Smoking status: Some Days    Packs/day: 0.10    Types: Cigarettes   Smokeless tobacco: Never  Vaping Use   Vaping Use: Never used  Substance Use Topics   Alcohol use: Not Currently    Alcohol/week: 1.0 standard drink of alcohol    Types: 1  Cans of beer per week    Comment: quit 2000   Drug use: Yes    Frequency: 1.0 times per week    Types: Cocaine   Social History   Socioeconomic History   Marital status: Divorced    Spouse name: Not on file   Number of children: Not on file   Years of education: Not on file   Highest education level: Not on file  Occupational History   Not on file  Tobacco Use   Smoking status: Some Days    Packs/day: 0.10    Types: Cigarettes   Smokeless tobacco: Never  Vaping Use   Vaping Use: Never used  Substance and Sexual Activity   Alcohol use: Not Currently    Alcohol/week: 1.0 standard drink of alcohol    Types: 1 Cans of beer per week    Comment: quit 2000   Drug use: Yes    Frequency: 1.0 times per week    Types: Cocaine   Sexual activity: Not on file  Other Topics Concern   Not on file  Social History Narrative   Not on file   Social Determinants of Health   Financial Resource Strain: Low Risk  (10/11/2022)   Overall Financial Resource Strain (CARDIA)    Difficulty of Paying Living Expenses: Not hard at all  Food Insecurity: Food Insecurity Present (10/11/2022)   Hunger Vital Sign    Worried About Estate manager/land agent of Food in the Last Year: Sometimes true    Ran Out of Food in the Last Year: Never true  Transportation Needs: No Transportation Needs (10/11/2022)   PRAPARE - Hydrologist (Medical): No    Lack of Transportation (Non-Medical): No  Physical Activity: Insufficiently Active (10/11/2022)   Exercise Vital Sign    Days of Exercise per Week: 1 day    Minutes of Exercise per Session: 30 min  Stress: No Stress Concern Present (10/11/2022)   [place]    Feeling of Stress : Not at all  Social Connections: Socially Isolated (10/11/2022)   Social Connection and Isolation Panel [NHANES]    Frequency of Communication with Friends and Family: Once a week    Frequency of Social  Gatherings with Friends and Family: Once a week    Attends Religious Services: Never    Marine scientist or Organizations: No    Attends Archivist Meetings: Never    Marital Status: Divorced  Human resources officer Violence: Not At Risk (06/02/2021)   Humiliation, Afraid, Rape, and Kick questionnaire    Fear of Current or Ex-Partner: No    Emotionally Abused: No    Physically Abused: No    Sexually Abused: No   Family Status  Relation Name Status   Sister  Deceased   Father  (Not Specified)   Brother  (Not Specified)   Neg Hx  (Not Specified)   Family History  Problem Relation Age of Onset   Cancer  Sister    Colon polyps Sister    Colon polyps Father    Stomach cancer Father 52   Colon polyps Brother    Colon cancer Neg Hx    Esophageal cancer Neg Hx    Rectal cancer Neg Hx    Inflammatory bowel disease Neg Hx    Liver disease Neg Hx    Pancreatic cancer Neg Hx    No Known Allergies    Medications: Outpatient Medications Prior to Visit  Medication Sig   Multiple Vitamin (MULTIVITAMIN ADULT PO) Take by mouth.   [DISCONTINUED] Albuterol Sulfate (PROAIR RESPICLICK) 403 (90 Base) MCG/ACT AEPB INHALE 2 PUFFS INTO THE LUNGS EVERY 6 (SIX) HOURS AS NEEDED.   [DISCONTINUED] amLODipine (NORVASC) 10 MG tablet Take 1 tablet (10 mg total) by mouth daily.   [DISCONTINUED] budesonide-formoterol (SYMBICORT) 160-4.5 MCG/ACT inhaler Inhale 2 puffs into the lungs in the morning and at bedtime.   [DISCONTINUED] omeprazole (PRILOSEC) 40 MG capsule Take 1 capsule (40 mg total) by mouth daily before dinner.   [DISCONTINUED] sildenafil (VIAGRA) 100 MG tablet Take 1 tablet (100 mg total) by mouth daily as needed for erectile dysfunction.   [DISCONTINUED] valsartan-hydrochlorothiazide (DIOVAN-HCT) 160-25 MG tablet TAKE 1 TABLET BY MOUTH DAILY.   No facility-administered medications prior to visit.    No Known Allergies  Patient Care Team: Elsie Stain, MD as PCP - General  (Pulmonary Disease)  Review of Systems  Constitutional:  Negative for chills, diaphoresis, fever, malaise/fatigue and weight loss.  HENT:  Negative for congestion, hearing loss, nosebleeds, sore throat and tinnitus.   Eyes:  Negative for blurred vision, photophobia and redness.  Respiratory:  Positive for shortness of breath. Negative for cough, hemoptysis, sputum production, wheezing and stridor.   Cardiovascular:  Positive for claudication. Negative for chest pain, palpitations, orthopnea, leg swelling and PND.  Gastrointestinal:  Negative for abdominal pain, blood in stool, constipation, diarrhea, heartburn, nausea and vomiting.  Genitourinary:  Negative for dysuria, flank pain, frequency, hematuria and urgency.  Musculoskeletal:  Negative for back pain, falls, joint pain, myalgias and neck pain.  Skin:  Negative for itching and rash.  Neurological:  Negative for dizziness, tingling, tremors, sensory change, speech change, focal weakness, seizures, loss of consciousness, weakness and headaches.  Endo/Heme/Allergies:  Negative for environmental allergies and polydipsia. Does not bruise/bleed easily.  Psychiatric/Behavioral:  Negative for depression, memory loss, substance abuse and suicidal ideas. The patient is not nervous/anxious and does not have insomnia.         Objective  BP (!) 167/82 (BP Location: Left Arm, Patient Position: Sitting, Cuff Size: Normal)   Pulse 75   Temp 98.2 F (36.8 C) (Oral)   Resp 16   Ht 5' 11.75" (1.822 m)   Wt 177 lb (80.3 kg)   SpO2 99%   BMI 24.17 kg/m  BP Readings from Last 3 Encounters:  10/11/22 (!) 167/82  08/01/22 119/75  05/19/22 (!) 145/89   Wt Readings from Last 3 Encounters:  10/11/22 177 lb (80.3 kg)  08/01/22 168 lb 6.4 oz (76.4 kg)  05/19/22 169 lb 3.2 oz (76.7 kg)      Physical Exam Vitals reviewed.  Constitutional:      Appearance: Normal appearance. He is well-developed. He is not diaphoretic.  HENT:     Head:  Normocephalic and atraumatic.     Nose: No nasal deformity, septal deviation, mucosal edema or rhinorrhea.     Right Sinus: No maxillary sinus tenderness or frontal sinus tenderness.  Left Sinus: No maxillary sinus tenderness or frontal sinus tenderness.     Mouth/Throat:     Mouth: Mucous membranes are moist.     Pharynx: No oropharyngeal exudate.     Comments: Poor dentition Eyes:     General: No scleral icterus.    Conjunctiva/sclera: Conjunctivae normal.     Pupils: Pupils are equal, round, and reactive to light.  Neck:     Thyroid: No thyromegaly.     Vascular: No carotid bruit or JVD.     Trachea: Trachea normal. No tracheal tenderness or tracheal deviation.  Cardiovascular:     Rate and Rhythm: Normal rate and regular rhythm.     Chest Wall: PMI is not displaced.     Pulses: No decreased pulses.     Heart sounds: Normal heart sounds, S1 normal and S2 normal. Heart sounds not distant. No murmur heard.    No systolic murmur is present.     No diastolic murmur is present.     No friction rub. No gallop. No S3 or S4 sounds.     Comments: Decreased pulses left lower extremity Pulmonary:     Effort: Pulmonary effort is normal. No tachypnea, accessory muscle usage or respiratory distress.     Breath sounds: No stridor. No decreased breath sounds, wheezing, rhonchi or rales.     Comments: Distant breath sounds Chest:     Chest wall: No tenderness.  Abdominal:     General: Bowel sounds are normal. There is no distension.     Palpations: Abdomen is soft. Abdomen is not rigid.     Tenderness: There is no abdominal tenderness. There is no guarding or rebound.  Musculoskeletal:        General: Normal range of motion.     Cervical back: Normal range of motion and neck supple. No edema, erythema or rigidity. No muscular tenderness. Normal range of motion.  Lymphadenopathy:     Head:     Right side of head: No submental or submandibular adenopathy.     Left side of head: No  submental or submandibular adenopathy.     Cervical: No cervical adenopathy.  Skin:    General: Skin is warm and dry.     Coloration: Skin is not pale.     Findings: No rash.     Nails: There is no clubbing.  Neurological:     General: No focal deficit present.     Mental Status: He is alert and oriented to person, place, and time.     Sensory: No sensory deficit.  Psychiatric:        Mood and Affect: Mood normal.        Speech: Speech normal.        Behavior: Behavior normal.        Thought Content: Thought content normal.        Judgment: Judgment normal.       Most recent functional status assessment:     No data to display         Most recent fall risk assessment:    10/11/2022    9:52 AM  [place] in the past year? 0  Number falls in past yr: 0  Injury with Fall? 0  Risk for fall due to : No Fall Risks  Follow up Falls evaluation completed    Most recent depression screenings:    10/11/2022    9:53 AM 08/01/2022    1:36 PM  PHQ 2/9  Scores  PHQ - 2 Score 0 0  PHQ- 9 Score 1    Most recent cognitive screening:    10/11/2022   12:44 PM  6CIT Screen  What Year? 0 points  What month? 0 points  What time? 0 points  Count back from 20 0 points  Months in reverse 0 points  Repeat phrase 0 points  Total Score 0 points   Most recent Audit-C alcohol use screening    10/11/2022   12:40 PM  Alcohol Use Disorder Test (AUDIT)  1. How often do you have a drink containing alcohol? 0  2. How many drinks containing alcohol do you have on a typical day when you are drinking? 0  3. How often do you have six or more drinks on one occasion? 0  AUDIT-C Score 0   A score of 3 or more in women, and 4 or more in men indicates increased risk for alcohol abuse, EXCEPT if all of the points are from question 1   Vision/Hearing Screen: No results found.  Last CBC Lab Results  Component Value Date   WBC 4.6 05/19/2022   HGB 16.9 05/19/2022   HCT 48.6  05/19/2022   MCV 87.1 05/19/2022   MCH 30.3 05/19/2022   RDW 12.5 05/19/2022   PLT 240 79/39/0300   Last metabolic panel Lab Results  Component Value Date   GLUCOSE 97 01/10/2022   NA 142 01/10/2022   K 4.7 01/10/2022   CL 104 01/10/2022   CO2 23 01/10/2022   BUN 22 01/10/2022   CREATININE 1.20 01/10/2022   EGFR 67 01/10/2022   CALCIUM 9.4 01/10/2022   PROT 7.0 05/10/2022   ALBUMIN 4.0 05/10/2022   LABGLOB 3.3 01/10/2022   AGRATIO 1.2 01/10/2022   BILITOT 0.2 05/10/2022   ALKPHOS 73 05/10/2022   AST 39 05/10/2022   ALT 38 05/19/2022   ANIONGAP 8 01/18/2021   Last lipids Lab Results  Component Value Date   CHOL 164 01/10/2022   HDL 38 (L) 01/10/2022   LDLCALC 84 01/10/2022   TRIG 250 (H) 01/10/2022   CHOLHDL 4.3 01/10/2022   Last hemoglobin A1c No results found for: "HGBA1C" Last thyroid functions No results found for: "TSH", "T3TOTAL", "T4TOTAL", "THYROIDAB" Last vitamin D No results found for: "25OHVITD2", "25OHVITD3", "VD25OH" Last vitamin B12 and Folate No results found for: "VITAMINB12", "FOLATE"    No results found for any visits on 10/11/22.    Assessment & Plan   Annual wellness visit done today including the all of the following: Reviewed patient's Family Medical History Reviewed and updated list of patient's medical providers Assessment of cognitive impairment was done Assessed patient's functional ability Established a written schedule for health screening services Health Risk Assessent Completed and Reviewed  Exercise Activities and Dietary recommendations  Goals      DIET - REDUCE FAT INTAKE        Immunization History  Administered Date(s) Administered   Fluad Quad(high Dose 65+) 10/11/2022   PFIZER(Purple Top)SARS-COV-2 Vaccination 02/29/2020, 03/25/2020   Pneumococcal Conjugate-13 06/04/2020   Pneumococcal Polysaccharide-23 06/07/2021   Tdap 06/04/2020   Zoster Recombinat (Shingrix) 06/22/2021, 09/07/2021    Health  Maintenance  Topic Date Due   Medicare Annual Wellness (AWV)  06/02/2022   COLONOSCOPY (Pts 45-20yr Insurance coverage will need to be confirmed)  10/30/2023   Pneumonia Vaccine 66 Years old  Completed   INFLUENZA VACCINE  Completed   Hepatitis C Screening  Completed   Zoster Vaccines- Shingrix  Completed  HPV VACCINES  Aged Out   COVID-19 Vaccine  Discontinued   Fecal DNA (Cologuard)  Discontinued     Discussed health benefits of physical activity, and encouraged him to engage in regular exercise appropriate for his age and condition.    Problem List Items Addressed This Visit       Cardiovascular and Mediastinum   Hypertension - Primary    Hypertension poorly controlled  Plan to increase valsartan HCT to 320/25 daily and see the patient back short-term follow-up      Relevant Medications   amLODipine (NORVASC) 10 MG tablet   sildenafil (VIAGRA) 100 MG tablet   valsartan-hydrochlorothiazide (DIOVAN-HCT) 320-25 MG tablet     Respiratory   COPD with chronic bronchitis   Relevant Medications   Albuterol Sulfate (PROAIR RESPICLICK) 222 (90 Base) MCG/ACT AEPB   budesonide-formoterol (SYMBICORT) 160-4.5 MCG/ACT inhaler     Digestive   Dental caries    Severe dental caries patient has follow-up dental appointment scheduled      Adenocarcinoma of colon (HCC)    Currently in remission has follow-up with Dr. Carlean Purl for colonoscopy      Chronic hepatitis C without hepatic coma ([place])    Currently in remission recent viral load 0        Musculoskeletal and Integument   Scalp lesion    Patient has dermatology follow-up        Other   History of tobacco use    Smoking small amounts     Current smoking consumption amount: 3 to 4 cigarettes a day  Dicsussion on advise to quit smoking and smoking impacts: Cardiovascular lung impacts  Patient's willingness to quit: Wants to quit  Methods to quit smoking discussed: Behavioral modification  Medication  management of smoking session drugs discussed: Not indicated  Resources provided:  AVS   Setting quit date not established  Follow-up arranged 3 months  Time spent counseling the patient: 5 minutes       Abnormal ankle brachial index (ABI)    Referral to vascular surgery made      Relevant Orders   Ambulatory referral to Vascular Surgery   Other Visit Diagnoses     Flu vaccine need       Relevant Orders   Flu Vaccine QUAD High Dose(Fluad) (Completed)       Return in about 3 months (around 01/11/2023) for htn.     Asencion Noble, MD

## 2022-10-11 NOTE — Assessment & Plan Note (Signed)
Smoking small amounts     Current smoking consumption amount: 3 to 4 cigarettes a day  Dicsussion on advise to quit smoking and smoking impacts: Cardiovascular lung impacts  Patient's willingness to quit: Wants to quit  Methods to quit smoking discussed: Behavioral modification  Medication management of smoking session drugs discussed: Not indicated  Resources provided:  AVS   Setting quit date not established  Follow-up arranged 3 months  Time spent counseling the patient: 5 minutes

## 2022-10-11 NOTE — Assessment & Plan Note (Signed)
Currently in remission has follow-up with Dr. Carlean Purl for colonoscopy

## 2022-10-11 NOTE — Patient Instructions (Signed)
Nice to see you.  We will check your lab work today.  No further liver cancer screening is needed.  If you need to be tested for Hepatitis C in the future a Hepatitis C RNA level or viral load will need to be checked as the Hepatitis C antibody used for initial screening will always be positive.   Have a great day and stay safe!

## 2022-10-11 NOTE — Progress Notes (Signed)
Does not feel he needs to use inhaler as often Moving slower d/t back pain, lower back  Left foot losing circulation

## 2022-10-11 NOTE — Assessment & Plan Note (Signed)
Severe dental caries patient has follow-up dental appointment scheduled

## 2022-10-11 NOTE — Assessment & Plan Note (Signed)
Patient has dermatology follow-up

## 2022-10-11 NOTE — Assessment & Plan Note (Signed)
Currently in remission recent viral load 0

## 2022-10-11 NOTE — Patient Instructions (Addendum)
Work on smoking cessation as we discussed  Referral to vascular surgery will be made because of decreased circulation of the left leg  Flu vaccine was given  Blood pressure elevated increase valsartan HCT to 320/25 daily  Advanced directive document given please give this consideration  Return to Dr. Joya Gaskins 3 months

## 2022-10-11 NOTE — Assessment & Plan Note (Signed)
Mr. Corrales has completed treatment for Hepatitis C with 8 weeks of Mavyret. Overall doing well. Reviewed lab work and discussed plan of care. Check Hepatitis C RNA level. If negative, no additional treatment is needed. At low risk for fibrosis so no additional need for screening for hepatocellular carcinoma. If screening is needed in the future will require Hepatitis C RNA level as Hepatitis C antibody will always be positive. Follow up with ID as needed pending lab work.

## 2022-10-11 NOTE — Progress Notes (Signed)
Subjective:    Patient ID: Jordan [name], male    DOB: 1956-09-03, 66 y.o.   MRN: [id number]  Chief Complaint  Patient presents with   Follow-up   Hepatitis C    HPI:  Jordan [name] is a 66 y.o. male with chronic Hepatitis C last seen on 08/01/22 at the completion of 8 weeks of [place]. Hepatitis C RNA level was undetectable. Here today for SVR 12/cure visit.   Jordan [name] has been doing well since his last office visit. Currently without symptoms and maybe has more energy since completing treatment. Denies abdominal pain, nausea, vomiting, fatigue, fever, scleral icterus or jaundice.    No Known Allergies    Outpatient Medications Prior to Visit  Medication Sig Dispense Refill   Albuterol Sulfate (PROAIR RESPICLICK) 269 (90 Base) MCG/ACT AEPB INHALE 2 PUFFS INTO THE LUNGS EVERY 6 (SIX) HOURS AS NEEDED. 1 each 4   amLODipine (NORVASC) 10 MG tablet Take 1 tablet (10 mg total) by mouth daily. 90 tablet 3   budesonide-formoterol (SYMBICORT) 160-4.5 MCG/ACT inhaler Inhale 2 puffs into the lungs in the morning and at bedtime. 10.2 g 11   Multiple Vitamin (MULTIVITAMIN ADULT PO) Take by mouth.     omeprazole (PRILOSEC) 40 MG capsule Take 1 capsule (40 mg total) by mouth daily before dinner. 60 capsule 3   sildenafil (VIAGRA) 100 MG tablet Take 1 tablet (100 mg total) by mouth daily as needed for erectile dysfunction. 10 tablet 3   valsartan-hydrochlorothiazide (DIOVAN-HCT) 320-25 MG tablet Take 1 tablet by mouth daily. 90 tablet 3   No facility-administered medications prior to visit.     Past Medical History:  Diagnosis Date   Allergy    seasonal   Aneurysm of infrarenal abdominal aorta (HCC)    3.4 cm per angela mcclung pa 04-16-2020 office note   Colon cancer ([place])    COPD (chronic obstructive pulmonary disease) ([place])    Family history of adverse reaction to anesthesia    mother slow to awaken   GERD (gastroesophageal reflux disease)    Hepatitis    hepatitis c dx in  remission per pt   Hypertension    Inguinal hernia    Multiple fractures 06/04/2020   Positive colorectal cancer screening using Cologuard test 08/27/2020   Substance abuse ([place])    Wears glasses    for reading     Past Surgical History:  Procedure Laterality Date   COLONOSCOPY  11/11/2020   ENDOSCOPIC MUCOSAL RESECTION N/A 03/10/2021   Procedure: ENDOSCOPIC MUCOSAL RESECTION;  Surgeon: Irving Copas., MD;  Location: [facility];  Service: Gastroenterology;  Laterality: N/A;   FLEXIBLE SIGMOIDOSCOPY N/A 03/10/2021   Procedure: FLEXIBLE SIGMOIDOSCOPY;  Surgeon: Rush Landmark Telford Nab., MD;  Location: [facility];  Service: Gastroenterology;  Laterality: N/A;   HEMOSTASIS CLIP PLACEMENT  03/10/2021   Procedure: HEMOSTASIS CLIP PLACEMENT;  Surgeon: Irving Copas., MD;  Location: [facility];  Service: Gastroenterology;;   INGUINAL HERNIA REPAIR Right 01/19/2021   Procedure: LAPAROSCOPIC RIGHT INGUINAL HERNIA REPAIR WITH MESH;  Surgeon: Thermon Leyland Nickola Major, MD;  Location: [facility];  Service: General;  Laterality: Right;   obstruction removed as a child   swallowed a peanut   POLYPECTOMY  03/10/2021   Procedure: POLYPECTOMY;  Surgeon: Mansouraty, Telford Nab., MD;  Location: [facility];  Service: Gastroenterology;;   POLYPECTOMY     SUBMUCOSAL LIFTING INJECTION  03/10/2021   Procedure: SUBMUCOSAL LIFTING INJECTION;  Surgeon: Irving Copas., MD;  Location:  [facility];  Service: Gastroenterology;;       Review of Systems  Constitutional:  Negative for chills, diaphoresis, fatigue and fever.  Respiratory:  Negative for cough, chest tightness, shortness of breath and wheezing.   Cardiovascular:  Negative for chest pain.  Gastrointestinal:  Negative for abdominal distention, abdominal pain, constipation, diarrhea, nausea and vomiting.  Neurological:  Negative for weakness and headaches.  Hematological:  Does not bruise/bleed easily.       Objective:    BP 122/80   Pulse 74   Temp 97.8 F (36.6 C) (Oral)   SpO2 98%  Nursing note and vital signs reviewed.  Physical Exam Constitutional:      General: He is not in acute distress.    Appearance: He is well-developed.  Cardiovascular:     Rate and Rhythm: Normal rate and regular rhythm.     Heart sounds: Normal heart sounds. No murmur heard.    No friction rub. No gallop.  Pulmonary:     Effort: Pulmonary effort is normal. No respiratory distress.     Breath sounds: Normal breath sounds. No wheezing or rales.  Chest:     Chest wall: No tenderness.  Abdominal:     General: Bowel sounds are normal. There is no distension.     Palpations: Abdomen is soft. There is no mass.     Tenderness: There is no abdominal tenderness. There is no guarding or rebound.  Skin:    General: Skin is warm and dry.  Neurological:     Mental Status: He is alert and oriented to person, place, and time.  Psychiatric:        Behavior: Behavior normal.        Thought Content: Thought content normal.        Judgment: Judgment normal.         10/11/2022    9:53 AM 08/01/2022    1:36 PM 05/19/2022   10:39 AM 05/10/2022    2:23 PM 01/10/2022    1:43 PM  Depression screen PHQ 2/9  Decreased Interest 0 0 0 0 0  Down, Depressed, Hopeless 0 0 0 0 0  PHQ - 2 Score 0 0 0 0 0  Altered sleeping 0    0  Tired, decreased energy 1    1  Change in appetite 0    0  Feeling bad or failure about yourself  0    0  Trouble concentrating 0    0  Moving slowly or fidgety/restless 0    0  Suicidal thoughts 0    0  PHQ-9 Score 1    1       Assessment & Plan:    Patient Active Problem List   Diagnosis Date Noted   Abnormal ankle brachial index (ABI) 10/11/2022   Chronic hepatitis C without hepatic coma ([place]) 05/19/2022   Scalp lesion 05/10/2022   Tick bite 05/10/2022   Adenocarcinoma of colon ([place]) 01/19/2021   Dental caries 11/23/2020   Unilateral inguinal hernia without obstruction or  gangrene 10/27/2020   Erectile dysfunction 08/11/2020   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   Hypertension 06/04/2020   History of tobacco use 06/04/2020   Compression fracture of thoracic vertebra with routine healing 04/30/2020     Problem List Items Addressed This Visit       Digestive   Chronic hepatitis C without hepatic coma ([place]) - Primary    Jordan [name] has completed treatment for Hepatitis C with 8 weeks of  [place]. Overall doing well. Reviewed lab work and discussed plan of care. Check Hepatitis C RNA level. If negative, no additional treatment is needed. At low risk for fibrosis so no additional need for screening for hepatocellular carcinoma. If screening is needed in the future will require Hepatitis C RNA level as Hepatitis C antibody will always be positive. Follow up with ID as needed pending lab work.       Relevant Orders   Hepatitis C RNA quantitative     I am having Jordan [name] maintain his Multiple Vitamin (MULTIVITAMIN ADULT PO), Albuterol Sulfate, amLODipine, budesonide-formoterol, omeprazole, sildenafil, and valsartan-hydrochlorothiazide.    Follow-up: As needed pending lab work results.    Terri Piedra, MSN, FNP-C Nurse Practitioner [facility] [place] number: [phone]

## 2022-10-11 NOTE — Assessment & Plan Note (Signed)
Referral to vascular surgery made

## 2022-10-11 NOTE — Assessment & Plan Note (Signed)
Hypertension poorly controlled  Plan to increase valsartan HCT to 320/25 daily and see the patient back short-term follow-up

## 2022-10-12 ENCOUNTER — Other Ambulatory Visit: Payer: Self-pay

## 2022-10-13 ENCOUNTER — Telehealth: Payer: Self-pay

## 2022-10-13 ENCOUNTER — Other Ambulatory Visit: Payer: Self-pay

## 2022-10-13 LAB — HEPATITIS C RNA QUANTITATIVE
HCV Quantitative Log: <1.18 log IU/mL
HCV RNA, PCR, QN: <15 IU/mL

## 2022-10-13 NOTE — Telephone Encounter (Signed)
Patient aware of results and to continue to follow up with Dr.Wright.  [place], CMA

## 2022-10-13 NOTE — Telephone Encounter (Signed)
-----   Message from [place], [place] sent at 10/13/2022  9:05 AM EST ----- Please inform Mr. Postlewaite that his Hepatitis C is cured and no additional treatment or screenings are needed. Continue to follow up with Dr. Joya Gaskins.

## 2022-10-14 ENCOUNTER — Other Ambulatory Visit: Payer: Self-pay

## 2022-10-17 ENCOUNTER — Ambulatory Visit: Payer: Medicare Other | Admitting: Family

## 2022-11-02 DIAGNOSIS — S62616A Displaced fracture of proximal phalanx of right little finger, initial encounter for closed fracture: Secondary | ICD-10-CM | POA: Diagnosis not present

## 2022-11-02 DIAGNOSIS — M79641 Pain in right hand: Secondary | ICD-10-CM | POA: Diagnosis not present

## 2022-11-03 ENCOUNTER — Other Ambulatory Visit: Payer: Self-pay | Admitting: *Deleted

## 2022-11-03 DIAGNOSIS — R6889 Other general symptoms and signs: Secondary | ICD-10-CM

## 2022-11-04 ENCOUNTER — Encounter (HOSPITAL_COMMUNITY): Payer: Medicare Other

## 2022-11-08 DIAGNOSIS — S62616D Displaced fracture of proximal phalanx of right little finger, subsequent encounter for fracture with routine healing: Secondary | ICD-10-CM | POA: Diagnosis not present

## 2022-11-10 ENCOUNTER — Ambulatory Visit (INDEPENDENT_AMBULATORY_CARE_PROVIDER_SITE_OTHER): Payer: Medicare Other | Admitting: Physician Assistant

## 2022-11-10 ENCOUNTER — Ambulatory Visit (HOSPITAL_COMMUNITY)
Admission: RE | Admit: 2022-11-10 | Discharge: 2022-11-10 | Disposition: A | Discharge status: Home / Self Care | Payer: Medicare Other | Source: Ambulatory Visit | Attending: Physician Assistant | Admitting: Physician Assistant

## 2022-11-10 VITALS — BP 141/79 | HR 84 | Temp 97.3°F | Resp 18 | Ht 71.75 in | Wt 178.3 lb

## 2022-11-10 DIAGNOSIS — R6889 Other general symptoms and signs: Secondary | ICD-10-CM | POA: Diagnosis not present

## 2022-11-10 NOTE — Progress Notes (Signed)
Office Note     CC:  follow up Requesting Provider:  Elsie Stain, MD  HPI: Jordan [name] is a 66 y.o. (16-Nov-1955) male who presents as a new patient having had an abnormal ABI insurance screening and home.  He was referred by his PCP for evaluation.  He denies any history of claudication, rest pain, or nonhealing wounds of bilateral lower extremities.  Past medical history significant for hypertension and COPD.  He is a former smoker who quit 3 years ago.  He denies any history of high cholesterol or hyperlipidemia.   Past Medical History:  Diagnosis Date   Allergy    seasonal   Aneurysm of infrarenal abdominal aorta (HCC)    3.4 cm per angela mcclung pa 04-16-2020 office note   Colon cancer ([place])    COPD (chronic obstructive pulmonary disease) ([place])    Family history of adverse reaction to anesthesia    mother slow to awaken   GERD (gastroesophageal reflux disease)    Hepatitis    hepatitis c dx in remission per pt   Hypertension    Inguinal hernia    Multiple fractures 06/04/2020   Positive colorectal cancer screening using Cologuard test 08/27/2020   Substance abuse ([place])    Wears glasses    for reading    Past Surgical History:  Procedure Laterality Date   COLONOSCOPY  11/11/2020   ENDOSCOPIC MUCOSAL RESECTION N/A 03/10/2021   Procedure: ENDOSCOPIC MUCOSAL RESECTION;  Surgeon: Irving Copas., MD;  Location: [facility];  Service: Gastroenterology;  Laterality: N/A;   FLEXIBLE SIGMOIDOSCOPY N/A 03/10/2021   Procedure: FLEXIBLE SIGMOIDOSCOPY;  Surgeon: Rush Landmark Telford Nab., MD;  Location: [facility];  Service: Gastroenterology;  Laterality: N/A;   HEMOSTASIS CLIP PLACEMENT  03/10/2021   Procedure: HEMOSTASIS CLIP PLACEMENT;  Surgeon: Irving Copas., MD;  Location: [facility];  Service: Gastroenterology;;   INGUINAL HERNIA REPAIR Right 01/19/2021   Procedure: LAPAROSCOPIC RIGHT INGUINAL HERNIA REPAIR WITH MESH;  Surgeon: Stechschulte, Nickola Major, MD;  Location: [facility];  Service: General;  Laterality: Right;   obstruction removed as a child   swallowed a peanut   POLYPECTOMY  03/10/2021   Procedure: POLYPECTOMY;  Surgeon: Mansouraty, Telford Nab., MD;  Location: [facility];  Service: Gastroenterology;;   POLYPECTOMY     SUBMUCOSAL LIFTING INJECTION  03/10/2021   Procedure: SUBMUCOSAL LIFTING INJECTION;  Surgeon: Irving Copas., MD;  Location: [facility];  Service: Gastroenterology;;    Social History   Socioeconomic History   Marital status: Divorced    Spouse name: Not on file   Number of children: Not on file   Years of education: Not on file   Highest education level: Not on file  Occupational History   Not on file  Tobacco Use   Smoking status: Former    Packs/day: 0.10    Types: Cigarettes    Quit date: 03/15/2019    Years since quitting: 3.6   Smokeless tobacco: Never  Vaping Use   Vaping Use: Never used  Substance and Sexual Activity   Alcohol use: Not Currently    Alcohol/week: 1.0 standard drink of alcohol    Types: 1 Cans of beer per week    Comment: quit 2000   Drug use: Yes    Frequency: 1.0 times per week    Types: Cocaine   Sexual activity: Not on file  Other Topics Concern   Not on file  Social History Narrative   Not on file  Social Determinants of Health   Financial Resource Strain: Low Risk  (10/11/2022)   Overall Financial Resource Strain (CARDIA)    Difficulty of Paying Living Expenses: Not hard at all  Food Insecurity: Food Insecurity Present (10/11/2022)   Hunger Vital Sign    Worried About Running Out of Food in the Last Year: Sometimes true    Ran Out of Food in the Last Year: Never true  Transportation Needs: No Transportation Needs (10/11/2022)   PRAPARE - Hydrologist (Medical): No    Lack of Transportation (Non-Medical): No  Physical Activity: Insufficiently Active (10/11/2022)   Exercise Vital Sign    Days of  Exercise per Week: 1 day    Minutes of Exercise per Session: 30 min  Stress: No Stress Concern Present (10/11/2022)   [place]    Feeling of Stress : Not at all  Social Connections: Socially Isolated (10/11/2022)   Social Connection and Isolation Panel [NHANES]    Frequency of Communication with Friends and Family: Once a week    Frequency of Social Gatherings with Friends and Family: Once a week    Attends Religious Services: Never    Marine scientist or Organizations: No    Attends Archivist Meetings: Never    Marital Status: Divorced  Human resources officer Violence: Not At Risk (06/02/2021)   Humiliation, Afraid, Rape, and Kick questionnaire    Fear of Current or Ex-Partner: No    Emotionally Abused: No    Physically Abused: No    Sexually Abused: No    Family History  Problem Relation Age of Onset   Cancer Sister    Colon polyps Sister    Colon polyps Father    Stomach cancer Father 15   Colon polyps Brother    Colon cancer Neg Hx    Esophageal cancer Neg Hx    Rectal cancer Neg Hx    Inflammatory bowel disease Neg Hx    Liver disease Neg Hx    Pancreatic cancer Neg Hx     Current Outpatient Medications  Medication Sig Dispense Refill   Albuterol Sulfate (PROAIR RESPICLICK) 494 (90 Base) MCG/ACT AEPB INHALE 2 PUFFS INTO THE LUNGS EVERY 6 (SIX) HOURS AS NEEDED. 1 each 4   amLODipine (NORVASC) 10 MG tablet Take 1 tablet (10 mg total) by mouth daily. 90 tablet 3   budesonide-formoterol (SYMBICORT) 160-4.5 MCG/ACT inhaler Inhale 2 puffs into the lungs in the morning and at bedtime. 10.2 g 11   Multiple Vitamin (MULTIVITAMIN ADULT PO) Take by mouth.     omeprazole (PRILOSEC) 40 MG capsule Take 1 capsule (40 mg total) by mouth daily before dinner. 60 capsule 3   sildenafil (VIAGRA) 100 MG tablet Take 1 tablet (100 mg total) by mouth daily as needed for erectile dysfunction. 10 tablet 3    valsartan-hydrochlorothiazide (DIOVAN-HCT) 320-25 MG tablet Take 1 tablet by mouth daily. 90 tablet 3   No current facility-administered medications for this visit.    No Known Allergies   REVIEW OF SYSTEMS:   '[X]'$  denotes positive finding, '[ ]'$  denotes negative finding Cardiac  Comments:  Chest pain or chest pressure:    Shortness of breath upon exertion:    Short of breath when lying flat:    Irregular heart rhythm:        Vascular    Pain in calf, thigh, or hip brought on by ambulation:    Pain in  feet at night that wakes you up from your sleep:     Blood clot in your veins:    Leg swelling:         Pulmonary    Oxygen at home:    Productive cough:     Wheezing:         Neurologic    Sudden weakness in arms or legs:     Sudden numbness in arms or legs:     Sudden onset of difficulty speaking or slurred speech:    Temporary loss of vision in one eye:     Problems with dizziness:         Gastrointestinal    Blood in stool:     Vomited blood:         Genitourinary    Burning when urinating:     Blood in urine:        Psychiatric    Major depression:         Hematologic    Bleeding problems:    Problems with blood clotting too easily:        Skin    Rashes or ulcers:        Constitutional    Fever or chills:      PHYSICAL EXAMINATION:  Vitals:   11/10/22 0821  BP: (!) 141/79  Pulse: 84  Resp: 18  Temp: (!) 97.3 F (36.3 C)  TempSrc: Temporal  SpO2: 96%  Weight: 178 lb 4.8 oz (80.9 kg)  Height: 5' 11.75" (1.822 m)    General:  WDWN in NAD; vital signs documented above Gait: Not observed HENT: WNL, normocephalic Pulmonary: normal non-labored breathing , without Rales, rhonchi,  wheezing Cardiac: regular HR Abdomen: soft, NT, no masses Skin: without rashes Vascular Exam/Pulses:  Right Left  Radial 2+ (normal) 2+ (normal)  DP 2+ (normal) 2+ (normal)  PT 1+ (weak) 2+ (normal)   Extremities: without ischemic changes, without Gangrene ,  without cellulitis; without open wounds;  Musculoskeletal: no muscle wasting or atrophy  Neurologic: A&O X 3;  No focal weakness or paresthesias are detected Psychiatric:  The pt has Normal affect.   Non-Invasive Vascular Imaging:    ABI/TBIToday's ABIToday's TBIPrevious ABIPrevious TBI  +-------+-----------+-----------+------------+------------+  Right 1.03       0.77                                 +-------+-----------+-----------+------------+------------+  Left  1.09       0.71                                 +-------+-----------+-----------+-     ASSESSMENT/PLAN:: 66 y.o. male for evaluation after abnormal ABI screening as part of a insurance exam  -Subjectively the patient is without any claudication symptoms -On exam he has palpable pedal pulses bilaterally.  ABIs and TBI's are within normal limits.  No indication for further workup or repeat study at this time -Continue follow-up with PCP for chronic medical conditions -Patient will follow up on an as needed basis   Dagoberto Ligas, PA-C [facility] [phone]  Clinic MD:   [place]ot Dock

## 2022-11-22 DIAGNOSIS — S62616D Displaced fracture of proximal phalanx of right little finger, subsequent encounter for fracture with routine healing: Secondary | ICD-10-CM | POA: Diagnosis not present

## 2022-11-22 DIAGNOSIS — M79641 Pain in right hand: Secondary | ICD-10-CM | POA: Diagnosis not present

## 2022-11-24 DIAGNOSIS — L821 Other seborrheic keratosis: Secondary | ICD-10-CM | POA: Diagnosis not present

## 2022-12-07 DIAGNOSIS — M79641 Pain in right hand: Secondary | ICD-10-CM | POA: Diagnosis not present

## 2022-12-13 ENCOUNTER — Other Ambulatory Visit: Payer: Self-pay

## 2022-12-14 ENCOUNTER — Other Ambulatory Visit: Payer: Self-pay

## 2022-12-15 DIAGNOSIS — S62616D Displaced fracture of proximal phalanx of right little finger, subsequent encounter for fracture with routine healing: Secondary | ICD-10-CM | POA: Diagnosis not present

## 2023-01-03 DIAGNOSIS — M79641 Pain in right hand: Secondary | ICD-10-CM | POA: Diagnosis not present

## 2023-01-11 ENCOUNTER — Encounter: Payer: Self-pay | Admitting: Critical Care Medicine

## 2023-01-11 ENCOUNTER — Ambulatory Visit: Payer: 59 | Attending: Critical Care Medicine | Admitting: Critical Care Medicine

## 2023-01-11 ENCOUNTER — Other Ambulatory Visit: Payer: Self-pay

## 2023-01-11 VITALS — BP 124/66 | HR 89 | Temp 97.7°F | Ht 71.0 in | Wt 175.0 lb

## 2023-01-11 DIAGNOSIS — R6889 Other general symptoms and signs: Secondary | ICD-10-CM | POA: Diagnosis not present

## 2023-01-11 DIAGNOSIS — J4489 Other specified chronic obstructive pulmonary disease: Secondary | ICD-10-CM | POA: Diagnosis not present

## 2023-01-11 DIAGNOSIS — K029 Dental caries, unspecified: Secondary | ICD-10-CM | POA: Diagnosis not present

## 2023-01-11 DIAGNOSIS — B182 Chronic viral hepatitis C: Secondary | ICD-10-CM | POA: Diagnosis not present

## 2023-01-11 DIAGNOSIS — L989 Disorder of the skin and subcutaneous tissue, unspecified: Secondary | ICD-10-CM

## 2023-01-11 DIAGNOSIS — C189 Malignant neoplasm of colon, unspecified: Secondary | ICD-10-CM | POA: Diagnosis not present

## 2023-01-11 MED ORDER — PREDNISONE 10 MG PO TABS
ORAL_TABLET | ORAL | 0 refills | Status: DC
  Filled 2023-01-11: qty 20, 5d supply, fill #0

## 2023-01-11 MED ORDER — VALSARTAN-HYDROCHLOROTHIAZIDE 320-25 MG PO TABS
1.0000 | ORAL_TABLET | Freq: Every day | ORAL | 3 refills | Status: DC
  Filled 2023-01-11 (×2): qty 90, 90d supply, fill #0

## 2023-01-11 MED ORDER — OMEPRAZOLE 40 MG PO CPDR
40.0000 mg | DELAYED_RELEASE_CAPSULE | Freq: Every day | ORAL | 3 refills | Status: DC
  Filled 2023-01-11: qty 60, 60d supply, fill #0
  Filled 2023-03-27: qty 60, 60d supply, fill #1
  Filled 2023-06-28: qty 60, 60d supply, fill #2

## 2023-01-11 MED ORDER — AMLODIPINE BESYLATE 10 MG PO TABS
10.0000 mg | ORAL_TABLET | Freq: Every day | ORAL | 3 refills | Status: DC
  Filled 2023-05-02: qty 90, 90d supply, fill #0

## 2023-01-11 MED ORDER — CEFDINIR 300 MG PO CAPS
300.0000 mg | ORAL_CAPSULE | Freq: Two times a day (BID) | ORAL | 0 refills | Status: AC
  Filled 2023-01-11: qty 14, 7d supply, fill #0

## 2023-01-11 NOTE — Assessment & Plan Note (Signed)
No vascular disease

## 2023-01-11 NOTE — Assessment & Plan Note (Signed)
Acute exacerbation of COPD  Will give cefdinir and pulse prednisone continue inhaled medications

## 2023-01-11 NOTE — Assessment & Plan Note (Signed)
Benign according to dermatology

## 2023-01-11 NOTE — Assessment & Plan Note (Addendum)
Has follow-up December 2024 for repeat colonoscopy

## 2023-01-11 NOTE — Progress Notes (Signed)
Patient: Jordan [name], Male    DOB: 1956-01-11, 67 y.o.   MRN: [id number]  Subjective  Chief Complaint  Patient presents with   Hypertension    HTN f/u. Med refills.  Cough X3 weeks, tightness of chest.  Pain on R shoulder - due to tripping     09/2022 Today the patient's main complaint is shortness of breath he is not been using his [employer] regularly also has some low back pain after heavy lifting this has been treated with over-the-counter medications.  On arrival blood pressure is also elevated turns 67/82.  He is still smoking about 3 to 4 cigarettes daily.  He agrees to and received the flu vaccine at this visit.  He also complains of left lower extremity numbness and claudication with exercise.  He has no prior history of noted vascular disease.  01/11/23 Patient seen in return follow-up and he had a cough for 3 weeks coughing up thick gray-green mucus.  He smokes an occasional 1 cigarette.  Hepatitis C has cleared.  He had a dermatologic lesion on the head dermatology said it was benign.  There are no other complaints.  He has multiple medications that need to be refilled at this visit.  There are no primary care gaps.  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   Hypertension 06/04/2020   History of tobacco use 06/04/2020   Compression fracture of thoracic vertebra with routine healing 04/30/2020   Past Medical History:   Diagnosis Date   Allergy    seasonal   Aneurysm of infrarenal abdominal aorta (HCC)    3.4 cm per angela mcclung pa 04-16-2020 office note   Colon cancer ([place])    COPD (chronic obstructive pulmonary disease) ([place])    Family history of adverse reaction to anesthesia    mother slow to awaken   GERD (gastroesophageal reflux disease)    Hepatitis    hepatitis c dx in remission per pt   Hypertension    Inguinal hernia    Multiple fractures 06/04/2020   Positive colorectal cancer screening using Cologuard test 08/27/2020   Substance abuse ([place])    Wears glasses    for reading   Past Surgical History:  Procedure Laterality Date   COLONOSCOPY  11/11/2020   ENDOSCOPIC MUCOSAL RESECTION N/A 03/10/2021   Procedure: ENDOSCOPIC MUCOSAL RESECTION;  Surgeon: Irving Copas., MD;  Location: [facility];  Service: Gastroenterology;  Laterality: N/A;   FLEXIBLE SIGMOIDOSCOPY N/A 03/10/2021   Procedure: FLEXIBLE SIGMOIDOSCOPY;  Surgeon: Rush Landmark Telford Nab., MD;  Location: [facility];  Service: Gastroenterology;  Laterality: N/A;   HEMOSTASIS CLIP PLACEMENT  03/10/2021   Procedure: HEMOSTASIS CLIP PLACEMENT;  Surgeon: Irving Copas., MD;  Location: [facility];  Service: Gastroenterology;;   INGUINAL HERNIA REPAIR Right 01/19/2021   Procedure: LAPAROSCOPIC RIGHT INGUINAL HERNIA REPAIR WITH MESH;  Surgeon: Thermon Leyland Nickola Major, MD;  Location: [facility];  Service: General;  Laterality: Right;  obstruction removed as a child   swallowed a peanut   POLYPECTOMY  03/10/2021   Procedure: POLYPECTOMY;  Surgeon: Mansouraty, Telford Nab., MD;  Location: [facility];  Service: Gastroenterology;;   POLYPECTOMY     SUBMUCOSAL LIFTING INJECTION  03/10/2021   Procedure: SUBMUCOSAL LIFTING INJECTION;  Surgeon: Irving Copas., MD;  Location: [facility];  Service: Gastroenterology;;   Social History   Tobacco Use   Smoking status: Former    Packs/day: 0.10     Types: Cigarettes    Quit date: 03/15/2019    Years since quitting: 3.8   Smokeless tobacco: Never  Vaping Use   Vaping Use: Never used  Substance Use Topics   Alcohol use: Not Currently    Alcohol/week: 1.0 standard drink of alcohol    Types: 1 Cans of beer per week    Comment: quit 2000   Drug use: Yes    Frequency: 1.0 times per week    Types: Cocaine   Social History   Socioeconomic History   Marital status: Divorced    Spouse name: Not on file   Number of children: Not on file   Years of education: Not on file   Highest education level: Not on file  Occupational History   Not on file  Tobacco Use   Smoking status: Former    Packs/day: 0.10    Types: Cigarettes    Quit date: 03/15/2019    Years since quitting: 3.8   Smokeless tobacco: Never  Vaping Use   Vaping Use: Never used  Substance and Sexual Activity   Alcohol use: Not Currently    Alcohol/week: 1.0 standard drink of alcohol    Types: 1 Cans of beer per week    Comment: quit 2000   Drug use: Yes    Frequency: 1.0 times per week    Types: Cocaine   Sexual activity: Not on file  Other Topics Concern   Not on file  Social History Narrative   Not on file   Social Determinants of Health   Financial Resource Strain: Low Risk  (10/11/2022)   Overall Financial Resource Strain (CARDIA)    Difficulty of Paying Living Expenses: Not hard at all  Food Insecurity: Food Insecurity Present (10/11/2022)   Hunger Vital Sign    Worried About Running Out of Food in the Last Year: Sometimes true    Ran Out of Food in the Last Year: Never true  Transportation Needs: No Transportation Needs (10/11/2022)   PRAPARE - Hydrologist (Medical): No    Lack of Transportation (Non-Medical): No  Physical Activity: Insufficiently Active (10/11/2022)   Exercise Vital Sign    Days of Exercise per Week: 1 day    Minutes of Exercise per Session: 30 min  Stress: No Stress Concern Present (10/11/2022)    [place]    Feeling of Stress : Not at all  Social Connections: Socially Isolated (10/11/2022)   Social Connection and Isolation Panel [NHANES]    Frequency of Communication with Friends and Family: Once a week    Frequency of Social Gatherings with Friends and Family: Once a week    Attends Religious Services: Never    Marine scientist or Organizations: No    Attends Archivist Meetings: Never    Marital Status: Divorced  Human resources officer Violence: Not At Risk (06/02/2021)   Humiliation, Afraid, Rape, and Kick questionnaire    Fear of  Current or Ex-Partner: No    Emotionally Abused: No    Physically Abused: No    Sexually Abused: No   Family Status  Relation Name Status   Sister  Deceased   Father  (Not Specified)   Brother  (Not Specified)   Neg Hx  (Not Specified)   Family History  Problem Relation Age of Onset   Cancer Sister    Colon polyps Sister    Colon polyps Father    Stomach cancer Father 95   Colon polyps Brother    Colon cancer Neg Hx    Esophageal cancer Neg Hx    Rectal cancer Neg Hx    Inflammatory bowel disease Neg Hx    Liver disease Neg Hx    Pancreatic cancer Neg Hx    No Known Allergies    Medications: Outpatient Medications Prior to Visit  Medication Sig   Albuterol Sulfate (PROAIR RESPICLICK) 123XX123 (90 Base) MCG/ACT AEPB INHALE 2 PUFFS INTO THE LUNGS EVERY 6 (SIX) HOURS AS NEEDED.   budesonide-formoterol (SYMBICORT) 160-4.5 MCG/ACT inhaler Inhale 2 puffs into the lungs in the morning and at bedtime.   Multiple Vitamin (MULTIVITAMIN ADULT PO) Take by mouth.   sildenafil (VIAGRA) 100 MG tablet Take 1 tablet (100 mg total) by mouth daily as needed for erectile dysfunction.   [DISCONTINUED] amLODipine (NORVASC) 10 MG tablet Take 1 tablet (10 mg total) by mouth daily.   [DISCONTINUED] omeprazole (PRILOSEC) 40 MG capsule Take 1 capsule (40 mg total) by mouth daily before  dinner.   [DISCONTINUED] valsartan-hydrochlorothiazide (DIOVAN-HCT) 320-25 MG tablet Take 1 tablet by mouth daily.   No facility-administered medications prior to visit.    No Known Allergies  Patient Care Team: Elsie Stain, MD as PCP - General (Pulmonary Disease)  Review of Systems  Constitutional:  Negative for chills, diaphoresis, fever, malaise/fatigue and weight loss.  HENT:  Negative for congestion, hearing loss, nosebleeds, sore throat and tinnitus.   Eyes:  Negative for blurred vision, photophobia and redness.  Respiratory:  Positive for cough, sputum production and shortness of breath. Negative for hemoptysis, wheezing and stridor.   Cardiovascular:  Negative for chest pain, palpitations, orthopnea, claudication, leg swelling and PND.  Gastrointestinal:  Negative for abdominal pain, blood in stool, constipation, diarrhea, heartburn, nausea and vomiting.  Genitourinary:  Negative for dysuria, flank pain, frequency, hematuria and urgency.  Musculoskeletal:  Negative for back pain, falls, joint pain, myalgias and neck pain.  Skin:  Negative for itching and rash.  Neurological:  Negative for dizziness, tingling, tremors, sensory change, speech change, focal weakness, seizures, loss of consciousness, weakness and headaches.  Endo/Heme/Allergies:  Negative for environmental allergies and polydipsia. Does not bruise/bleed easily.  Psychiatric/Behavioral:  Negative for depression, memory loss, substance abuse and suicidal ideas. The patient is not nervous/anxious and does not have insomnia.         Objective  BP 124/66 (BP Location: Left Arm, Patient Position: Sitting, Cuff Size: Normal)   Pulse 89   Temp 97.7 F (36.5 C) (Oral)   Ht '5\' 11"'$  (1.803 m)   Wt 175 lb (79.4 kg)   SpO2 96%   BMI 24.41 kg/m  BP Readings from Last 3 Encounters:  01/11/23 124/66  11/10/22 (!) 141/79  10/11/22 122/80   Wt Readings from Last 3 Encounters:  01/11/23 175 lb (79.4 kg)  11/10/22  178 lb 4.8 oz (80.9 kg)  10/11/22 177 lb (80.3 kg)      Physical Exam Vitals reviewed.  Constitutional:  Appearance: Normal appearance. He is well-developed. He is not diaphoretic.  HENT:     Head: Normocephalic and atraumatic.     Nose: Congestion and rhinorrhea present. No nasal deformity, septal deviation or mucosal edema.     Right Sinus: No maxillary sinus tenderness or frontal sinus tenderness.     Left Sinus: No maxillary sinus tenderness or frontal sinus tenderness.     Comments: Nasal purulence    Mouth/Throat:     Mouth: Mucous membranes are moist.     Pharynx: No oropharyngeal exudate.     Comments: Poor dentition Eyes:     General: No scleral icterus.    Conjunctiva/sclera: Conjunctivae normal.     Pupils: Pupils are equal, round, and reactive to light.  Neck:     Thyroid: No thyromegaly.     Vascular: No carotid bruit or JVD.     Trachea: Trachea normal. No tracheal tenderness or tracheal deviation.  Cardiovascular:     Rate and Rhythm: Normal rate and regular rhythm.     Chest Wall: PMI is not displaced.     Pulses: No decreased pulses.     Heart sounds: Normal heart sounds, S1 normal and S2 normal. Heart sounds not distant. No murmur heard.    No systolic murmur is present.     No diastolic murmur is present.     No friction rub. No gallop. No S3 or S4 sounds.     Comments: Decreased pulses left lower extremity Pulmonary:     Effort: Pulmonary effort is normal. No tachypnea, accessory muscle usage or respiratory distress.     Breath sounds: No stridor. Wheezing and rhonchi present. No decreased breath sounds or rales.     Comments: Distant breath sounds Chest:     Chest wall: No tenderness.  Abdominal:     General: Bowel sounds are normal. There is no distension.     Palpations: Abdomen is soft. Abdomen is not rigid.     Tenderness: There is no abdominal tenderness. There is no guarding or rebound.  Musculoskeletal:        General: Normal range of  motion.     Cervical back: Normal range of motion and neck supple. No edema, erythema or rigidity. No muscular tenderness. Normal range of motion.  Lymphadenopathy:     Head:     Right side of head: No submental or submandibular adenopathy.     Left side of head: No submental or submandibular adenopathy.     Cervical: No cervical adenopathy.  Skin:    General: Skin is warm and dry.     Coloration: Skin is not pale.     Findings: No rash.     Nails: There is no clubbing.  Neurological:     General: No focal deficit present.     Mental Status: He is alert and oriented to person, place, and time.     Sensory: No sensory deficit.  Psychiatric:        Mood and Affect: Mood normal.        Speech: Speech normal.        Behavior: Behavior normal.        Thought Content: Thought content normal.        Judgment: Judgment normal.         Most recent depression screenings:    01/11/2023   10:18 AM 10/11/2022    9:53 AM  PHQ 2/9 Scores  PHQ - 2 Score 0 0  PHQ- 9 Score 0  1   Most recent cognitive screening:    10/11/2022   12:44 PM  6CIT Screen  What Year? 0 points  What month? 0 points  What time? 0 points  Count back from 20 0 points  Months in reverse 0 points  Repeat phrase 0 points  Total Score 0 points   Most recent Audit-C alcohol use screening    10/11/2022   12:40 PM  Alcohol Use Disorder Test (AUDIT)  1. How often do you have a drink containing alcohol? 0  2. How many drinks containing alcohol do you have on a typical day when you are drinking? 0  3. How often do you have six or more drinks on one occasion? 0  AUDIT-C Score 0   A score of 3 or more in women, and 4 or more in men indicates increased risk for alcohol abuse, EXCEPT if all of the points are from question 1   Vision/Hearing Screen: No results found.  Last CBC Lab Results  Component Value Date   WBC 4.6 05/19/2022   HGB 16.9 05/19/2022   HCT 48.6 05/19/2022   MCV 87.1 05/19/2022   MCH 30.3  05/19/2022   RDW 12.5 05/19/2022   PLT 240 [id number]   Last metabolic panel Lab Results  Component Value Date   GLUCOSE 97 01/10/2022   NA 142 01/10/2022   K 4.7 01/10/2022   CL 104 01/10/2022   CO2 23 01/10/2022   BUN 22 01/10/2022   CREATININE 1.20 01/10/2022   EGFR 67 01/10/2022   CALCIUM 9.4 01/10/2022   PROT 7.0 05/10/2022   ALBUMIN 4.0 05/10/2022   LABGLOB 3.3 01/10/2022   AGRATIO 1.2 01/10/2022   BILITOT 0.2 05/10/2022   ALKPHOS 73 05/10/2022   AST 39 05/10/2022   ALT 38 05/19/2022   ANIONGAP 8 01/18/2021   Last lipids Lab Results  Component Value Date   CHOL 164 01/10/2022   HDL 38 (L) 01/10/2022   LDLCALC 84 01/10/2022   TRIG 250 (H) 01/10/2022   CHOLHDL 4.3 01/10/2022   Last hemoglobin A1c No results found for: "HGBA1C" Last thyroid functions No results found for: "TSH", "T3TOTAL", "T4TOTAL", "THYROIDAB" Last vitamin D No results found for: "25OHVITD2", "25OHVITD3", "VD25OH" Last vitamin B12 and Folate No results found for: "VITAMINB12", "FOLATE"    No results found for any visits on 01/11/23.    Immunization History  Administered Date(s) Administered   Fluad Quad(high Dose 65+) 10/11/2022   PFIZER(Purple Top)SARS-COV-2 Vaccination 02/29/2020, 03/25/2020   Pneumococcal Conjugate-13 06/04/2020   Pneumococcal Polysaccharide-23 06/07/2021   Tdap 06/04/2020   Zoster Recombinat (Shingrix) 06/22/2021, 09/07/2021    Health Maintenance  Topic Date Due   Medicare Annual Wellness (AWV)  10/12/2023   COLONOSCOPY (Pts 45-15yr Insurance coverage will need to be confirmed)  10/30/2023   DTaP/Tdap/Td (2 - Td or Tdap) 06/04/2030   Pneumonia Vaccine 68 Years old  Completed   INFLUENZA VACCINE  Completed   Hepatitis C Screening  Completed   Zoster Vaccines- Shingrix  Completed   HPV VACCINES  Aged Out   COVID-19 Vaccine  Discontinued   Fecal DNA (Cologuard)  Discontinued      Problem List Items Addressed This Visit       Respiratory   COPD with  chronic bronchitis    Acute exacerbation of COPD  Will give cefdinir and pulse prednisone continue inhaled medications      Relevant Medications   predniSONE (DELTASONE) 10 MG tablet     Digestive   Dental  caries - Primary    Referral to dentistry made      Relevant Medications   cefdinir (OMNICEF) 300 MG capsule   Other Relevant Orders   Ambulatory referral to Dentistry   Adenocarcinoma of colon [facility])    Has follow-up December 2024 for repeat colonoscopy      Relevant Medications   predniSONE (DELTASONE) 10 MG tablet   cefdinir (OMNICEF) 300 MG capsule   Chronic hepatitis C without hepatic coma (HCC)    Hep C has cleared and fibrosis score was low      Relevant Medications   cefdinir (OMNICEF) 300 MG capsule     Musculoskeletal and Integument   Scalp lesion    Benign according to dermatology      Other Visit Diagnoses     Abnormal ankle brachial index (ABI)          Return in about 4 months (around 05/12/2023) for htn, copd.     Asencion Noble, MD

## 2023-01-11 NOTE — Patient Instructions (Signed)
No change in medications refills on all blood pressure medicines sent to pharmacy along with inhalers  Start prednisone take for 5 days as directed  Start cefdinir 1 twice daily for 7 days for bronchitis  Referral to dentistry Dr. Posey Pronto was made they will determine whether perhaps a oral surgeon should see you instead  Return to see Dr. Joya Gaskins 4 months

## 2023-01-11 NOTE — Assessment & Plan Note (Signed)
Referral to dentistry made

## 2023-01-11 NOTE — Assessment & Plan Note (Signed)
Hep C has cleared and fibrosis score was low

## 2023-01-12 ENCOUNTER — Other Ambulatory Visit: Payer: Self-pay

## 2023-01-12 DIAGNOSIS — S62616D Displaced fracture of proximal phalanx of right little finger, subsequent encounter for fracture with routine healing: Secondary | ICD-10-CM | POA: Diagnosis not present

## 2023-01-12 DIAGNOSIS — M79641 Pain in right hand: Secondary | ICD-10-CM | POA: Diagnosis not present

## 2023-05-02 ENCOUNTER — Other Ambulatory Visit: Payer: Self-pay

## 2023-05-16 ENCOUNTER — Other Ambulatory Visit: Payer: Self-pay | Admitting: Pharmacist

## 2023-05-16 ENCOUNTER — Other Ambulatory Visit: Payer: Self-pay

## 2023-05-16 ENCOUNTER — Encounter: Payer: Self-pay | Admitting: Critical Care Medicine

## 2023-05-16 ENCOUNTER — Ambulatory Visit: Payer: 59 | Attending: Critical Care Medicine | Admitting: Critical Care Medicine

## 2023-05-16 VITALS — BP 128/78 | HR 85 | Wt 179.2 lb

## 2023-05-16 DIAGNOSIS — I10 Essential (primary) hypertension: Secondary | ICD-10-CM

## 2023-05-16 DIAGNOSIS — J411 Mucopurulent chronic bronchitis: Secondary | ICD-10-CM | POA: Diagnosis not present

## 2023-05-16 DIAGNOSIS — Z122 Encounter for screening for malignant neoplasm of respiratory organs: Secondary | ICD-10-CM

## 2023-05-16 DIAGNOSIS — K029 Dental caries, unspecified: Secondary | ICD-10-CM

## 2023-05-16 DIAGNOSIS — C189 Malignant neoplasm of colon, unspecified: Secondary | ICD-10-CM | POA: Diagnosis not present

## 2023-05-16 MED ORDER — DOXYCYCLINE HYCLATE 100 MG PO TABS
100.0000 mg | ORAL_TABLET | Freq: Two times a day (BID) | ORAL | 0 refills | Status: AC
  Filled 2023-05-16: qty 14, 7d supply, fill #0

## 2023-05-16 MED ORDER — BENZONATATE 100 MG PO CAPS
100.0000 mg | ORAL_CAPSULE | Freq: Two times a day (BID) | ORAL | 0 refills | Status: DC | PRN
  Filled 2023-05-16: qty 20, 10d supply, fill #0

## 2023-05-16 MED ORDER — FLUTICASONE-SALMETEROL 115-21 MCG/ACT IN AERO
2.0000 | INHALATION_SPRAY | Freq: Two times a day (BID) | RESPIRATORY_TRACT | 12 refills | Status: DC
  Filled 2023-05-16: qty 12, 30d supply, fill #0

## 2023-05-16 NOTE — Assessment & Plan Note (Signed)
COPD exacerbation will avoid prednisone but give a course of doxycycline for 7 days  Will switch to Advair HFA which is patient's preferred medicine

## 2023-05-16 NOTE — Assessment & Plan Note (Signed)
Not able to get into dental will make another referral

## 2023-05-16 NOTE — Assessment & Plan Note (Signed)
Repeat colonoscopy in December of this year

## 2023-05-16 NOTE — Patient Instructions (Signed)
Take doxycycline 1 twice daily for the cough Benzonatate 1 pill 3 times daily will be given for cough as needed We will check into which dentist your insurance plan covers and which inhalers her insurance plan will cover with the best co-pay  Low-dose lung cancer screening will be obtained  Repeat labs will be obtained  Return to Dr. Delford Field 6 weeks

## 2023-05-16 NOTE — Progress Notes (Signed)
Patient: Jordan [name], Male    DOB: [birth year], 67 y.o.   MRN: [id number]  Subjective  Chief Complaint  Patient presents with   Foot Swelling   Cough     09/2022 Today the patient's main complaint is shortness of breath he is not been using his [employer] regularly also has some low back pain after heavy lifting this has been treated with over-the-counter medications.  On arrival blood pressure is also elevated turns 67/82.  He is still smoking about 3 to 4 cigarettes daily.  He agrees to and received the flu vaccine at this visit.  He also complains of left lower extremity numbness and claudication with exercise.  He has no prior history of noted vascular disease.  01/11/23 Patient seen in return follow-up and he had a cough for 3 weeks coughing up thick gray-green mucus.  He smokes an occasional 1 cigarette.  Hepatitis C has cleared.  He had a dermatologic lesion on the head dermatology said it was benign.  There are no other complaints.  He has multiple medications that need to be refilled at this visit.  There are no primary care gaps.  Patient did see vascular for our abnormal office ABI and there study was normal he has no vascular disease peripherally  05/16/23 This patient is seen in return follow-up he complains of cough with increased green mucus production for the past week.  Blood pressure on arrival 137/71.  He complains of his insurance not covering his Symbicort.  We ran a claims report and Advair is preferred.  Patient has follow-up colonoscopy in a year with his adenocarcinoma of colon.  Patient maintaining blood pressure medication.  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   Hypertension 06/04/2020   History of tobacco use 06/04/2020   Compression fracture of thoracic vertebra with routine healing 04/30/2020   Past Medical History:  Diagnosis Date   Allergy    seasonal   Aneurysm of infrarenal abdominal aorta (HCC)    3.4 cm per angela mcclung pa 04-16-2020 office note   Colon cancer (HCC)    COPD (chronic obstructive pulmonary disease) (HCC)    Family history of adverse reaction to anesthesia    mother slow to awaken   GERD (gastroesophageal reflux disease)    Hepatitis    hepatitis c dx in remission per pt   Hypertension    Inguinal hernia    Multiple fractures 06/04/2020   Positive colorectal cancer screening using Cologuard test 08/27/2020   Substance abuse (HCC)    Wears glasses    for reading   Past Surgical History:  Procedure Laterality Date   COLONOSCOPY  11/11/2020   ENDOSCOPIC MUCOSAL RESECTION N/A 03/10/2021   Procedure: ENDOSCOPIC MUCOSAL RESECTION;  Surgeon: Lemar Lofty., MD;  Location: [facility];  Service: Gastroenterology;  Laterality: N/A;   FLEXIBLE SIGMOIDOSCOPY N/A 03/10/2021   Procedure: FLEXIBLE SIGMOIDOSCOPY;  Surgeon: Meridee Score Netty Starring., MD;  Location: [facility];  Service: Gastroenterology;  Laterality: N/A;   HEMOSTASIS CLIP PLACEMENT  03/10/2021   Procedure: HEMOSTASIS CLIP PLACEMENT;  Surgeon: Lemar Lofty., MD;  Location: [facility];  Service: Gastroenterology;;   INGUINAL HERNIA REPAIR Right 01/19/2021   Procedure: LAPAROSCOPIC RIGHT INGUINAL HERNIA REPAIR WITH MESH;  Surgeon: Stechschulte, Hyman Hopes, MD;  Location: [facility];  Service: General;  Laterality: Right;   obstruction removed as a child   swallowed a peanut   POLYPECTOMY  03/10/2021   Procedure: POLYPECTOMY;  Surgeon: Mansouraty, Netty Starring., MD;  Location: [facility];  Service:  Gastroenterology;;   POLYPECTOMY     SUBMUCOSAL LIFTING INJECTION  03/10/2021   Procedure: SUBMUCOSAL LIFTING INJECTION;  Surgeon: Lemar Lofty., MD;  Location: [facility];  Service: Gastroenterology;;   Social History   Tobacco Use   Smoking status: Former    Packs/day: .1    Types: Cigarettes    Quit date: 03/15/2019    Years since quitting: 4.1   Smokeless tobacco: Never  Vaping Use   Vaping Use: Never used  Substance Use Topics   Alcohol use: Not Currently    Alcohol/week: 1.0 standard drink of alcohol    Types: 1 Cans of beer per week    Comment: quit 2000   Drug use: Yes    Frequency: 1.0 times per week    Types: Cocaine   Social History   Socioeconomic History   Marital status: Divorced    Spouse name: Not on file   Number of children: Not on file   Years of education: Not on file   Highest education level: Not on file  Occupational History   Not on file  Tobacco Use   Smoking status: Former    Packs/day: .1    Types: Cigarettes    Quit date: 03/15/2019    Years since quitting: 4.1   Smokeless tobacco: Never  Vaping Use   Vaping Use: Never used  Substance and Sexual Activity   Alcohol use: Not Currently    Alcohol/week: 1.0 standard drink of alcohol    Types: 1 Cans of beer per week    Comment: quit 2000   Drug use: Yes    Frequency: 1.0 times per week    Types: Cocaine   Sexual activity: Not on file  Other Topics Concern   Not on file  Social History Narrative   Not on file   Social Determinants of Health   Financial Resource Strain: Low Risk  (10/11/2022)   Overall Financial Resource Strain (CARDIA)    Difficulty of Paying Living Expenses: Not hard at all  Food Insecurity: Food Insecurity Present (10/11/2022)   Hunger Vital Sign    Worried About Running Out of Food in the Last Year: Sometimes true    Ran Out of Food in the Last Year: Never true  Transportation Needs: No Transportation Needs (10/11/2022)   PRAPARE - Therapist, art (Medical): No    Lack of Transportation (Non-Medical): No  Physical Activity: Insufficiently Active (10/11/2022)   Exercise Vital Sign    Days of Exercise per Week: 1 day    Minutes of Exercise per Session: 30 min  Stress: No Stress Concern Present (10/11/2022)   [employer] of Occupational Health - Occupational Stress Questionnaire    Feeling of Stress : Not at all  Social Connections: Socially Isolated (10/11/2022)   Social Connection and Isolation Panel [NHANES]    Frequency of Communication with Friends and Family: Once a week    Frequency of Social Gatherings with Friends and Family: Once a week    Attends  Religious Services: Never    Active Member of Clubs or Organizations: No    Attends Banker Meetings: Never    Marital Status: Divorced  Catering manager Violence: Not At Risk (06/02/2021)   Humiliation, Afraid, Rape, and Kick questionnaire    Fear of Current or Ex-Partner: No    Emotionally Abused: No    Physically Abused: No    Sexually Abused: No   Family Status  Relation Name Status   Sister  Deceased   Father  (Not Specified)   Brother  (Not Specified)   Neg Hx  (Not Specified)   Family History  Problem Relation Age of Onset   Cancer Sister    Colon polyps Sister    Colon polyps Father    Stomach cancer Father 65   Colon polyps Brother    Colon cancer Neg Hx    Esophageal cancer Neg Hx    Rectal cancer Neg Hx    Inflammatory bowel disease Neg Hx    Liver disease Neg Hx    Pancreatic cancer Neg Hx    No Known Allergies    Medications: Outpatient Medications Prior to Visit  Medication Sig   Albuterol Sulfate (PROAIR RESPICLICK) 108 (90 Base) MCG/ACT AEPB INHALE 2 PUFFS INTO THE LUNGS EVERY 6 (SIX) HOURS AS NEEDED.   Multiple Vitamin (MULTIVITAMIN ADULT PO) Take by mouth.   omeprazole (PRILOSEC) 40 MG capsule Take 1 capsule (40 mg total) by mouth daily before dinner.   sildenafil (VIAGRA) 100 MG tablet Take 1  tablet (100 mg total) by mouth daily as needed for erectile dysfunction.   valsartan-hydrochlorothiazide (DIOVAN-HCT) 320-25 MG tablet Take 1 tablet by mouth daily.   [DISCONTINUED] amLODipine (NORVASC) 10 MG tablet Take 1 tablet (10 mg total) by mouth daily.   [DISCONTINUED] budesonide-formoterol (SYMBICORT) 160-4.5 MCG/ACT inhaler Inhale 2 puffs into the lungs in the morning and at bedtime.   [DISCONTINUED] predniSONE (DELTASONE) 10 MG tablet Take 4 tablets daily for 5 days then stop   No facility-administered medications prior to visit.    No Known Allergies  Patient Care Team: Storm Frisk, MD as PCP - General (Pulmonary Disease)  Review of Systems  Constitutional:  Negative for chills, diaphoresis, fever, malaise/fatigue and weight loss.  HENT:  Negative for congestion, hearing loss, nosebleeds, sore throat and tinnitus.   Eyes:  Negative for blurred vision, photophobia and redness.  Respiratory:  Positive for cough, sputum production and shortness of breath. Negative for hemoptysis, wheezing and stridor.   Cardiovascular:  Negative for chest pain, palpitations, orthopnea, claudication, leg swelling and PND.  Gastrointestinal:  Negative for abdominal pain, blood in stool, constipation, diarrhea, heartburn, nausea and vomiting.  Genitourinary:  Negative for dysuria, flank pain, frequency, hematuria and urgency.  Musculoskeletal:  Negative for back pain, falls, joint pain, myalgias and neck pain.  Skin:  Negative for itching and rash.  Neurological:  Negative for dizziness, tingling, tremors, sensory change, speech change, focal weakness, seizures, loss of consciousness, weakness and headaches.  Endo/Heme/Allergies:  Negative for environmental allergies and polydipsia. Does not bruise/bleed easily.  Psychiatric/Behavioral:  Negative for depression, memory loss, substance abuse and suicidal ideas. The patient is not nervous/anxious and does not have insomnia.         Objective   BP 128/78   Pulse 85   Wt 179 lb 3.2 oz (81.3 kg)   SpO2 96%   BMI 24.99 kg/m  BP Readings from Last 3 Encounters:  05/16/23 128/78  01/11/23 124/66  11/10/22 Marland Kitchen)  141/79   Wt Readings from Last 3 Encounters:  05/16/23 179 lb 3.2 oz (81.3 kg)  01/11/23 175 lb (79.4 kg)  11/10/22 178 lb 4.8 oz (80.9 kg)      Physical Exam Vitals reviewed.  Constitutional:      Appearance: Normal appearance. He is well-developed. He is not diaphoretic.  HENT:     Head: Normocephalic and atraumatic.     Nose: No nasal deformity, septal deviation, mucosal edema, congestion or rhinorrhea.     Right Sinus: No maxillary sinus tenderness or frontal sinus tenderness.     Left Sinus: No maxillary sinus tenderness or frontal sinus tenderness.     Mouth/Throat:     Mouth: Mucous membranes are moist.     Pharynx: No oropharyngeal exudate.     Comments: Poor dentition Eyes:     General: No scleral icterus.    Conjunctiva/sclera: Conjunctivae normal.     Pupils: Pupils are equal, round, and reactive to light.  Neck:     Thyroid: No thyromegaly.     Vascular: No carotid bruit or JVD.     Trachea: Trachea normal. No tracheal tenderness or tracheal deviation.  Cardiovascular:     Rate and Rhythm: Normal rate and regular rhythm.     Chest Wall: PMI is not displaced.     Pulses: No decreased pulses.     Heart sounds: Normal heart sounds, S1 normal and S2 normal. Heart sounds not distant. No murmur heard.    No systolic murmur is present.     No diastolic murmur is present.     No friction rub. No gallop. No S3 or S4 sounds.     Comments: Decreased pulses left lower extremity Pulmonary:     Effort: Pulmonary effort is normal. No tachypnea, accessory muscle usage or respiratory distress.     Breath sounds: No stridor. Wheezing and rhonchi present. No decreased breath sounds or rales.     Comments: Distant breath sounds Chest:     Chest wall: No tenderness.  Abdominal:     General: Bowel sounds are  normal. There is no distension.     Palpations: Abdomen is soft. Abdomen is not rigid.     Tenderness: There is no abdominal tenderness. There is no guarding or rebound.  Musculoskeletal:        General: Normal range of motion.     Cervical back: Normal range of motion and neck supple. No edema, erythema or rigidity. No muscular tenderness. Normal range of motion.  Lymphadenopathy:     Head:     Right side of head: No submental or submandibular adenopathy.     Left side of head: No submental or submandibular adenopathy.     Cervical: No cervical adenopathy.  Skin:    General: Skin is warm and dry.     Coloration: Skin is not pale.     Findings: No rash.     Nails: There is no clubbing.  Neurological:     General: No focal deficit present.     Mental Status: He is alert and oriented to person, place, and time.     Sensory: No sensory deficit.  Psychiatric:        Mood and Affect: Mood normal.        Speech: Speech normal.        Behavior: Behavior normal.        Thought Content: Thought content normal.        Judgment: Judgment normal.  Most recent depression screenings:    05/16/2023    1:49 PM 01/11/2023   10:18 AM  PHQ 2/9 Scores  PHQ - 2 Score 0 0  PHQ- 9 Score  0   Most recent cognitive screening:    10/11/2022   12:44 PM  6CIT Screen  What Year? 0 points  What month? 0 points  What time? 0 points  Count back from 20 0 points  Months in reverse 0 points  Repeat phrase 0 points  Total Score 0 points   Most recent Audit-C alcohol use screening    10/11/2022   12:40 PM  Alcohol Use Disorder Test (AUDIT)  1. How often do you have a drink containing alcohol? 0  2. How many drinks containing alcohol do you have on a typical day when you are drinking? 0  3. How often do you have six or more drinks on one occasion? 0  AUDIT-C Score 0   A score of 3 or more in women, and 4 or more in men indicates increased risk for alcohol abuse, EXCEPT if all of the  points are from question 1   Vision/Hearing Screen: No results found.  Last CBC Lab Results  Component Value Date   WBC 4.6 05/19/2022   HGB 16.9 05/19/2022   HCT 48.6 05/19/2022   MCV 87.1 05/19/2022   MCH 30.3 05/19/2022   RDW 12.5 05/19/2022   PLT 240 05/19/2022   Last metabolic panel Lab Results  Component Value Date   GLUCOSE 97 01/10/2022   NA 142 01/10/2022   K 4.7 01/10/2022   CL 104 01/10/2022   CO2 23 01/10/2022   BUN 22 01/10/2022   CREATININE 1.20 01/10/2022   EGFR 67 01/10/2022   CALCIUM 9.4 01/10/2022   PROT 7.0 05/10/2022   ALBUMIN 4.0 05/10/2022   LABGLOB 3.3 01/10/2022   AGRATIO 1.2 01/10/2022   BILITOT 0.2 05/10/2022   ALKPHOS 73 05/10/2022   AST 39 05/10/2022   ALT 38 05/19/2022   ANIONGAP 8 01/18/2021   Last lipids Lab Results  Component Value Date   CHOL 164 01/10/2022   HDL 38 (L) 01/10/2022   LDLCALC 84 01/10/2022   TRIG 250 (H) 01/10/2022   CHOLHDL 4.3 01/10/2022   Last hemoglobin A1c No results found for: "HGBA1C" Last thyroid functions No results found for: "TSH", "T3TOTAL", "T4TOTAL", "THYROIDAB" Last vitamin D No results found for: "25OHVITD2", "25OHVITD3", "VD25OH" Last vitamin B12 and Folate No results found for: "VITAMINB12", "FOLATE"    No results found for any visits on 05/16/23.    Immunization History  Administered Date(s) Administered   Fluad Quad(high Dose 65+) 10/11/2022   PFIZER(Purple Top)SARS-COV-2 Vaccination 02/29/2020, 03/25/2020   Pneumococcal Conjugate-13 06/04/2020   Pneumococcal Polysaccharide-23 06/07/2021   Tdap 06/04/2020   Zoster Recombinant(Shingrix) 06/22/2021, 09/07/2021    Health Maintenance  Topic Date Due   INFLUENZA VACCINE  06/15/2023   Medicare Annual Wellness (AWV)  10/12/2023   Colonoscopy  10/30/2023   DTaP/Tdap/Td (2 - Td or Tdap) 06/04/2030   Pneumonia Vaccine 49+ Years old  Completed   Hepatitis C Screening  Completed   Zoster Vaccines- Shingrix  Completed   HPV VACCINES   Aged Out   COVID-19 Vaccine  Discontinued   Fecal DNA (Cologuard)  Discontinued      Problem List Items Addressed This Visit       Cardiovascular and Mediastinum   Hypertension - Primary   Relevant Orders   Comprehensive metabolic panel   CBC with Differential/Platelet  Respiratory   Mucopurulent chronic bronchitis (HCC)    COPD exacerbation will avoid prednisone but give a course of doxycycline for 7 days  Will switch to Advair HFA which is patient's preferred medicine        Digestive   Dental caries    Not able to get into dental will make another referral      Relevant Orders   Ambulatory referral to Dentistry   Adenocarcinoma of colon [facility])    Repeat colonoscopy in December of this year      Relevant Medications   doxycycline (VIBRA-TABS) 100 MG tablet   Other Visit Diagnoses     Encounter for screening for lung cancer       Relevant Orders   CT CHEST LUNG CA SCREEN LOW DOSE W/O CM     Return in about 6 weeks (around 06/27/2023) for htn, copd.     Shan Levans, MD

## 2023-05-17 ENCOUNTER — Telehealth: Payer: Self-pay

## 2023-05-17 ENCOUNTER — Other Ambulatory Visit: Payer: Self-pay

## 2023-05-17 LAB — COMPREHENSIVE METABOLIC PANEL
ALT: 13 IU/L (ref 0–44)
AST: 13 IU/L (ref 0–40)
Albumin: 4 g/dL (ref 3.9–4.9)
Alkaline Phosphatase: 96 IU/L (ref 44–121)
BUN/Creatinine Ratio: 18 (ref 10–24)
BUN: 30 mg/dL — ABNORMAL HIGH (ref 8–27)
Bilirubin Total: <0.2 mg/dL (ref 0.0–1.2)
CO2: 21 mmol/L (ref 20–29)
Calcium: 8.9 mg/dL (ref 8.6–10.2)
Chloride: 105 mmol/L (ref 96–106)
Creatinine, Ser: 1.67 mg/dL — ABNORMAL HIGH (ref 0.76–1.27)
Globulin, Total: 2.5 g/dL (ref 1.5–4.5)
Glucose: 133 mg/dL — ABNORMAL HIGH (ref 70–99)
Potassium: 4.3 mmol/L (ref 3.5–5.2)
Sodium: 141 mmol/L (ref 134–144)
Total Protein: 6.5 g/dL (ref 6.0–8.5)
eGFR: 44 mL/min/{1.73_m2} — ABNORMAL LOW (ref >59)

## 2023-05-17 LAB — CBC WITH DIFFERENTIAL/PLATELET
Basophils Absolute: 0.1 10*3/uL (ref 0.0–0.2)
Basos: 1 %
EOS (ABSOLUTE): 0.3 10*3/uL (ref 0.0–0.4)
Eos: 5 %
Hematocrit: 47.1 % (ref 37.5–51.0)
Hemoglobin: 15.7 g/dL (ref 13.0–17.7)
Immature Grans (Abs): 0 10*3/uL (ref 0.0–0.1)
Immature Granulocytes: 0 %
Lymphocytes Absolute: 1.5 10*3/uL (ref 0.7–3.1)
Lymphs: 27 %
MCH: 28.6 pg (ref 26.6–33.0)
MCHC: 33.3 g/dL (ref 31.5–35.7)
MCV: 86 fL (ref 79–97)
Monocytes Absolute: 0.6 10*3/uL (ref 0.1–0.9)
Monocytes: 11 %
Neutrophils Absolute: 3.2 10*3/uL (ref 1.4–7.0)
Neutrophils: 56 %
Platelets: 256 10*3/uL (ref 150–450)
RBC: 5.49 x10E6/uL (ref 4.14–5.80)
RDW: 15 % (ref 11.6–15.4)
WBC: 5.6 10*3/uL (ref 3.4–10.8)

## 2023-05-17 NOTE — Telephone Encounter (Signed)
-----   Message from Storm Frisk, MD sent at 05/17/2023  5:43 AM EDT ----- Let pt know labs stable blood count normal kidney stable

## 2023-05-17 NOTE — Telephone Encounter (Signed)
Pt was called and is aware of results, DOB was confirmed.  ?

## 2023-05-17 NOTE — Progress Notes (Signed)
Let pt know labs stable blood count normal kidney stable

## 2023-05-30 NOTE — Progress Notes (Signed)
Subjective:   Jordan [name] is a 67 y.o. male who presents for Medicare Annual/Subsequent preventive examination.  Visit Complete: Virtual  I connected with  Gara Kroner on 05/31/23 by a audio enabled telemedicine application and verified that I am speaking with the correct person using two identifiers.  Patient Location: Home  Provider Location: Home Office  I discussed the limitations of evaluation and management by telemedicine. The patient expressed understanding and agreed to proceed.  Review of Systems     Cardiac Risk Factors include: advanced age (>89men, >2 women);male gender;hypertension  Per patient no change in vitals since last visit, unable to obtain new vitals due to telehealth visit     Objective:    Today's Vitals   05/31/23 1448  Weight: 179 lb (81.2 kg)  Height: 5\' 11"  (1.803 m)   Body mass index is 24.97 kg/m.     05/31/2023    2:51 PM 06/02/2021    9:08 AM 03/10/2021    8:16 AM 01/19/2021    8:05 AM 06/04/2020    9:03 AM 04/04/2020    1:00 AM  Advanced Directives  Does Patient Have a Medical Advance Directive? No No No No No No  Would patient like information on creating a medical advance directive? Yes (MAU/Ambulatory/Procedural Areas - Information given) No - Patient declined No - Patient declined No - Patient declined      Current Medications (verified) Outpatient Encounter Medications as of 05/31/2023  Medication Sig   Albuterol Sulfate (PROAIR RESPICLICK) 108 (90 Base) MCG/ACT AEPB INHALE 2 PUFFS INTO THE LUNGS EVERY 6 (SIX) HOURS AS NEEDED.   benzonatate (TESSALON) 100 MG capsule Take 1 capsule (100 mg total) by mouth 2 (two) times daily as needed for cough.   fluticasone-salmeterol (ADVAIR HFA) 115-21 MCG/ACT inhaler Inhale 2 puffs into the lungs 2 (two) times daily.   Multiple Vitamin (MULTIVITAMIN ADULT PO) Take by mouth.   omeprazole (PRILOSEC) 40 MG capsule Take 1 capsule (40 mg total) by mouth daily before dinner.   sildenafil  (VIAGRA) 100 MG tablet Take 1 tablet (100 mg total) by mouth daily as needed for erectile dysfunction.   valsartan-hydrochlorothiazide (DIOVAN-HCT) 320-25 MG tablet Take 1 tablet by mouth daily.   No facility-administered encounter medications on file as of 05/31/2023.    Allergies (verified) Patient has no known allergies.   History: Past Medical History:  Diagnosis Date   Allergy    seasonal   Aneurysm of infrarenal abdominal aorta (HCC)    3.4 cm per angela mcclung pa 04-16-2020 office note   Colon cancer (HCC)    COPD (chronic obstructive pulmonary disease) (HCC)    Family history of adverse reaction to anesthesia    mother slow to awaken   GERD (gastroesophageal reflux disease)    Hepatitis    hepatitis c dx in remission per pt   Hypertension    Inguinal hernia    Multiple fractures 06/04/2020   Positive colorectal cancer screening using Cologuard test 08/27/2020   Substance abuse (HCC)    Wears glasses    for reading   Past Surgical History:  Procedure Laterality Date   COLONOSCOPY  11/11/2020   ENDOSCOPIC MUCOSAL RESECTION N/A 03/10/2021   Procedure: ENDOSCOPIC MUCOSAL RESECTION;  Surgeon: Lemar Lofty., MD;  Location: [facility];  Service: Gastroenterology;  Laterality: N/A;   FLEXIBLE SIGMOIDOSCOPY N/A 03/10/2021   Procedure: FLEXIBLE SIGMOIDOSCOPY;  Surgeon: Meridee Score Netty Starring., MD;  Location: [facility];  Service: Gastroenterology;  Laterality: N/A;  HEMOSTASIS CLIP PLACEMENT  03/10/2021   Procedure: HEMOSTASIS CLIP PLACEMENT;  Surgeon: Lemar Lofty., MD;  Location: [facility];  Service: Gastroenterology;;   INGUINAL HERNIA REPAIR Right 01/19/2021   Procedure: LAPAROSCOPIC RIGHT INGUINAL HERNIA REPAIR WITH MESH;  Surgeon: Stechschulte, Hyman Hopes, MD;  Location: [facility];  Service: General;  Laterality: Right;   obstruction removed as a child   swallowed a peanut   POLYPECTOMY  03/10/2021   Procedure: POLYPECTOMY;   Surgeon: Mansouraty, Netty Starring., MD;  Location: [facility];  Service: Gastroenterology;;   POLYPECTOMY     SUBMUCOSAL LIFTING INJECTION  03/10/2021   Procedure: SUBMUCOSAL LIFTING INJECTION;  Surgeon: Lemar Lofty., MD;  Location: [facility];  Service: Gastroenterology;;   Family History  Problem Relation Age of Onset   Cancer Sister    Colon polyps Sister    Colon polyps Father    Stomach cancer Father 92   Colon polyps Brother    Colon cancer Neg Hx    Esophageal cancer Neg Hx    Rectal cancer Neg Hx    Inflammatory bowel disease Neg Hx    Liver disease Neg Hx    Pancreatic cancer Neg Hx    Social History   Socioeconomic History   Marital status: Divorced    Spouse name: Not on file   Number of children: Not on file   Years of education: Not on file   Highest education level: Not on file  Occupational History   Not on file  Tobacco Use   Smoking status: Former    Current packs/day: 0.00    Types: Cigarettes    Quit date: 03/15/2019    Years since quitting: 4.2   Smokeless tobacco: Never  Vaping Use   Vaping status: Never Used  Substance and Sexual Activity   Alcohol use: Not Currently    Alcohol/week: 1.0 standard drink of alcohol    Types: 1 Cans of beer per week    Comment: quit 2000   Drug use: Yes    Frequency: 1.0 times per week    Types: Cocaine   Sexual activity: Not on file  Other Topics Concern   Not on file  Social History Narrative   Not on file   Social Determinants of Health   Financial Resource Strain: Low Risk  (05/31/2023)   Overall Financial Resource Strain (CARDIA)    Difficulty of Paying Living Expenses: Not hard at all  Food Insecurity: No Food Insecurity (05/31/2023)   Hunger Vital Sign    Worried About Running Out of Food in the Last Year: Never true    Ran Out of Food in the Last Year: Never true  Transportation Needs: No Transportation Needs (05/31/2023)   PRAPARE - Administrator, Civil Service (Medical):  No    Lack of Transportation (Non-Medical): No  Physical Activity: Insufficiently Active (05/31/2023)   Exercise Vital Sign    Days of Exercise per Week: 3 days    Minutes of Exercise per Session: 30 min  Stress: No Stress Concern Present (05/31/2023)   [employer] of Occupational Health - Occupational Stress Questionnaire    Feeling of Stress : Not at all  Social Connections: Socially Isolated (05/31/2023)   Social Connection and Isolation Panel [NHANES]    Frequency of Communication with Friends and Family: More than three times a week    Frequency of Social Gatherings with Friends and Family: Twice a week    Attends Religious Services: Never  Active Member of Clubs or Organizations: No    Attends Banker Meetings: Never    Marital Status: Divorced    Tobacco Counseling Counseling given: Not Answered   Clinical Intake:  Pre-visit preparation completed: Yes  Pain : No/denies pain     Diabetes: No  How often do you need to have someone help you when you read instructions, pamphlets, or other written materials from your doctor or pharmacy?: 1 - Never  Interpreter Needed?: No  Information entered by :: Kandis Fantasia LPN   Activities of Daily Living    05/31/2023    2:49 PM  In your present state of health, do you have any difficulty performing the following activities:  Hearing? 0  Vision? 0  Difficulty concentrating or making decisions? 0  Walking or climbing stairs? 0  Dressing or bathing? 0  Doing errands, shopping? 0  Preparing Food and eating ? N  Using the Toilet? N  In the past six months, have you accidently leaked urine? N  Do you have problems with loss of bowel control? N  Managing your Medications? N  Managing your Finances? N  Housekeeping or managing your Housekeeping? N    Patient Care Team: Storm Frisk, MD as PCP - General (Pulmonary Disease) Donzetta Starch, MD as Consulting Physician (Dermatology) Veryl Speak,  FNP as Nurse Practitioner (Infectious Diseases) Forestine Na (Physician Assistant) Bradly Bienenstock, MD as Consulting Physician (Orthopedic Surgery)  Indicate any recent Medical Services you may have received from other than [facility] providers in the past year (date may be approximate).     Assessment:   This is a routine wellness examination for [place].  Hearing/Vision screen Hearing Screening - Comments:: Denies hearing difficulties   Vision Screening - Comments:: No vision problems; will schedule routine eye exam soon    Dietary issues and exercise activities discussed:     Goals Addressed             This Visit's Progress    Remain active and independent         Depression Screen    05/16/2023    1:49 PM 01/11/2023   10:18 AM 10/11/2022    9:53 AM 08/01/2022    1:36 PM 05/19/2022   10:39 AM 05/10/2022    2:23 PM 01/10/2022    1:43 PM  PHQ 2/9 Scores  PHQ - 2 Score 0 0 0 0 0 0 0  PHQ- 9 Score  0 1    1    Fall Risk    05/31/2023    2:52 PM 05/16/2023    1:49 PM 10/11/2022    9:52 AM 08/01/2022    1:35 PM 05/19/2022   10:38 AM  Fall Risk   Falls in the past year? 0 0 0 0 0  Number falls in past yr:  0 0 0 0  Injury with Fall?  0 0 0 0  Risk for fall due to :  No Fall Risks No Fall Risks No Fall Risks   Follow up   Falls evaluation completed Falls evaluation completed Falls evaluation completed    MEDICARE RISK AT HOME:  Medicare Risk at Home - 05/31/23 1452     Any stairs in or around the home? No    If so, are there any without handrails? No    Home free of loose throw rugs in walkways, pet beds, electrical cords, etc? Yes    Adequate lighting in your home to reduce risk of  falls? Yes    Life alert? No    Use of a cane, walker or w/c? No    Grab bars in the bathroom? Yes    Shower chair or bench in shower? No    Elevated toilet seat or a handicapped toilet? No             TIMED UP AND GO:  Was the test performed?  No    Cognitive Function:         05/31/2023    2:54 PM 10/11/2022   12:44 PM  6CIT Screen  What Year? 0 points 0 points  What month? 0 points 0 points  What time? 0 points 0 points  Count back from 20 0 points 0 points  Months in reverse 2 points 0 points  Repeat phrase 0 points 0 points  Total Score 2 points 0 points    Immunizations Immunization History  Administered Date(s) Administered   Fluad Quad(high Dose 65+) 10/11/2022   PFIZER(Purple Top)SARS-COV-2 Vaccination 02/29/2020, 03/25/2020   Pneumococcal Conjugate-13 06/04/2020   Pneumococcal Polysaccharide-23 06/07/2021   Tdap 06/04/2020   Zoster Recombinant(Shingrix) 06/22/2021, 09/07/2021    TDAP status: Up to date  Pneumococcal vaccine status: Up to date  Covid-19 vaccine status: Information provided on how to obtain vaccines.   Qualifies for Shingles Vaccine? Yes   Zostavax completed No   Shingrix Completed?: Yes  Screening Tests Health Maintenance  Topic Date Due   INFLUENZA VACCINE  06/15/2023   Colonoscopy  10/30/2023   Medicare Annual Wellness (AWV)  05/30/2024   DTaP/Tdap/Td (2 - Td or Tdap) 06/04/2030   Pneumonia Vaccine 35+ Years old  Completed   Hepatitis C Screening  Completed   Zoster Vaccines- Shingrix  Completed   HPV VACCINES  Aged Out   COVID-19 Vaccine  Discontinued   Fecal DNA (Cologuard)  Discontinued    Health Maintenance  There are no preventive care reminders to display for this patient.  Colorectal cancer screening: Type of screening: Colonoscopy. Completed 10/29/21. Repeat every 2 years  Lung Cancer Screening: (Low Dose CT Chest recommended if Age 45-80 years, 20 pack-year currently smoking OR have quit w/in 15years.) does not qualify.   Lung Cancer Screening Referral: n/a  Additional Screening:  Hepatitis C Screening: does qualify; Completed 01/11/23  Vision Screening: Recommended annual ophthalmology exams for early detection of glaucoma and other disorders of the eye. Is the patient up to date  with their annual eye exam?  No  Who is the provider or what is the name of the office in which the patient attends annual eye exams? none If pt is not established with a provider, would they like to be referred to a provider to establish care? No .   Dental Screening: Recommended annual dental exams for proper oral hygiene  Community Resource Referral / Chronic Care Management: CRR required this visit?  No   CCM required this visit?  No     Plan:     I have personally reviewed and noted the following in the patient's chart:   Medical and social history Use of alcohol, tobacco or illicit drugs  Current medications and supplements including opioid prescriptions. Patient is not currently taking opioid prescriptions. Functional ability and status Nutritional status Physical activity Advanced directives List of other physicians Hospitalizations, surgeries, and ER visits in previous 12 months Vitals Screenings to include cognitive, depression, and falls Referrals and appointments  In addition, I have reviewed and discussed with patient certain preventive protocols, quality metrics,  and best practice recommendations. A written personalized care plan for preventive services as well as general preventive health recommendations were provided to patient.     Kandis Fantasia [place], [place]   [id number]   After Visit Summary: (MyChart) Due to this being a telephonic visit, the after visit summary with patients personalized plan was offered to patient via MyChart   Nurse Notes: No concerns

## 2023-05-30 NOTE — Patient Instructions (Incomplete)
[name] , Thank you for taking time to come for your Medicare Wellness Visit. I appreciate your ongoing commitment to your health goals. Please review the following plan we discussed and let me know if I can assist you in the future.   These are the goals we discussed:  Goals      DIET - REDUCE FAT INTAKE        This is a list of the screening recommended for you and due dates:  Health Maintenance  Topic Date Due   Flu Shot  06/15/2023   Medicare Annual Wellness Visit  10/12/2023   Colon Cancer Screening  10/30/2023   DTaP/Tdap/Td vaccine (2 - Td or Tdap) 06/04/2030   Pneumonia Vaccine  Completed   Hepatitis C Screening  Completed   Zoster (Shingles) Vaccine  Completed   HPV Vaccine  Aged Out   COVID-19 Vaccine  Discontinued   Cologuard (Stool DNA test)  Discontinued    Advanced directives: Information on Advanced Care Planning can be found at [facility] Advance Health Care Directives Advance Health Care Directives ([email or web address] Please bring a copy of your health care power of attorney and living will to the office to be added to your chart at your convenience.  Conditions/risks identified: Aim for 30 minutes of exercise or brisk walking, 6-8 glasses of water, and 5 servings of fruits and vegetables each day.  Next appointment: Follow up in one year for your annual wellness visit.   Preventive Care 68 Years and Older, Male  Preventive care refers to lifestyle choices and visits with your health care provider that can promote health and wellness. What does preventive care include? A yearly physical exam. This is also called an annual well check. Dental exams once or twice a year. Routine eye exams. Ask your health care provider how often you should have your eyes checked. Personal lifestyle choices, including: Daily care of your teeth and gums. Regular physical activity. Eating a healthy diet. Avoiding tobacco and drug use. Limiting alcohol  use. Practicing safe sex. Taking low doses of aspirin every day. Taking vitamin and mineral supplements as recommended by your health care provider. What happens during an annual well check? The services and screenings done by your health care provider during your annual well check will depend on your age, overall health, lifestyle risk factors, and family history of disease. Counseling  Your health care provider may ask you questions about your: Alcohol use. Tobacco use. Drug use. Emotional well-being. Home and relationship well-being. Sexual activity. Eating habits. History of falls. Memory and ability to understand (cognition). Work and work Astronomer. Screening  You may have the following tests or measurements: Height, weight, and BMI. Blood pressure. Lipid and cholesterol levels. These may be checked every 5 years, or more frequently if you are over 59 years old. Skin check. Lung cancer screening. You may have this screening every year starting at age 30 if you have a 30-pack-year history of smoking and currently smoke or have quit within the past 15 years. Fecal occult blood test (FOBT) of the stool. You may have this test every year starting at age 33. Flexible sigmoidoscopy or colonoscopy. You may have a sigmoidoscopy every 5 years or a colonoscopy every 10 years starting at age 64. Prostate cancer screening. Recommendations will vary depending on your family history and other risks. Hepatitis C blood test. Hepatitis B blood test. Sexually transmitted disease (STD) testing. Diabetes screening. This is done by checking your blood sugar (  glucose) after you have not eaten for a while (fasting). You may have this done every 1-3 years. Abdominal aortic aneurysm (AAA) screening. You may need this if you are a current or former smoker. Osteoporosis. You may be screened starting at age 27 if you are at high risk. Talk with your health care provider about your test results,  treatment options, and if necessary, the need for more tests. Vaccines  Your health care provider may recommend certain vaccines, such as: Influenza vaccine. This is recommended every year. Tetanus, diphtheria, and acellular pertussis (Tdap, Td) vaccine. You may need a Td booster every 10 years. Zoster vaccine. You may need this after age 71. Pneumococcal 13-valent conjugate (PCV13) vaccine. One dose is recommended after age 37. Pneumococcal polysaccharide (PPSV23) vaccine. One dose is recommended after age 79. Talk to your health care provider about which screenings and vaccines you need and how often you need them. This information is not intended to replace advice given to you by your health care provider. Make sure you discuss any questions you have with your health care provider. Document Released: 11/27/2015 Document Revised: 07/20/2016 Document Reviewed: 09/01/2015 Elsevier Interactive Patient Education  2017 [employer].  Fall Prevention in the Home Falls can cause injuries. They can happen to people of all ages. There are many things you can do to make your home safe and to help prevent falls. What can I do on the outside of my home? Regularly fix the edges of walkways and driveways and fix any cracks. Remove anything that might make you trip as you walk through a door, such as a raised step or threshold. Trim any bushes or trees on the path to your home. Use bright outdoor lighting. Clear any walking paths of anything that might make someone trip, such as rocks or tools. Regularly check to see if handrails are loose or broken. Make sure that both sides of any steps have handrails. Any raised decks and porches should have guardrails on the edges. Have any leaves, snow, or ice cleared regularly. Use sand or salt on walking paths during winter. Clean up any spills in your garage right away. This includes oil or grease spills. What can I do in the bathroom? Use night  lights. Install grab bars by the toilet and in the tub and shower. Do not use towel bars as grab bars. Use non-skid mats or decals in the tub or shower. If you need to sit down in the shower, use a plastic, non-slip stool. Keep the floor dry. Clean up any water that spills on the floor as soon as it happens. Remove soap buildup in the tub or shower regularly. Attach bath mats securely with double-sided non-slip rug tape. Do not have throw rugs and other things on the floor that can make you trip. What can I do in the bedroom? Use night lights. Make sure that you have a light by your bed that is easy to reach. Do not use any sheets or blankets that are too big for your bed. They should not hang down onto the floor. Have a firm chair that has side arms. You can use this for support while you get dressed. Do not have throw rugs and other things on the floor that can make you trip. What can I do in the kitchen? Clean up any spills right away. Avoid walking on wet floors. Keep items that you use a lot in easy-to-reach places. If you need to reach something above you, use  a strong step stool that has a grab bar. Keep electrical cords out of the way. Do not use floor polish or wax that makes floors slippery. If you must use wax, use non-skid floor wax. Do not have throw rugs and other things on the floor that can make you trip. What can I do with my stairs? Do not leave any items on the stairs. Make sure that there are handrails on both sides of the stairs and use them. Fix handrails that are broken or loose. Make sure that handrails are as long as the stairways. Check any carpeting to make sure that it is firmly attached to the stairs. Fix any carpet that is loose or worn. Avoid having throw rugs at the top or bottom of the stairs. If you do have throw rugs, attach them to the floor with carpet tape. Make sure that you have a light switch at the top of the stairs and the bottom of the stairs. If  you do not have them, ask someone to add them for you. What else can I do to help prevent falls? Wear shoes that: Do not have high heels. Have rubber bottoms. Are comfortable and fit you well. Are closed at the toe. Do not wear sandals. If you use a stepladder: Make sure that it is fully opened. Do not climb a closed stepladder. Make sure that both sides of the stepladder are locked into place. Ask someone to hold it for you, if possible. Clearly mark and make sure that you can see: Any grab bars or handrails. First and last steps. Where the edge of each step is. Use tools that help you move around (mobility aids) if they are needed. These include: Canes. Walkers. Scooters. Crutches. Turn on the lights when you go into a dark area. Replace any light bulbs as soon as they burn out. Set up your furniture so you have a clear path. Avoid moving your furniture around. If any of your floors are uneven, fix them. If there are any pets around you, be aware of where they are. Review your medicines with your doctor. Some medicines can make you feel dizzy. This can increase your chance of falling. Ask your doctor what other things that you can do to help prevent falls. This information is not intended to replace advice given to you by your health care provider. Make sure you discuss any questions you have with your health care provider. Document Released: 08/27/2009 Document Revised: 04/07/2016 Document Reviewed: 12/05/2014 Elsevier Interactive Patient Education  2017 [employer].

## 2023-05-31 ENCOUNTER — Ambulatory Visit: Payer: 59 | Attending: Critical Care Medicine

## 2023-05-31 VITALS — Ht 71.0 in | Wt 179.0 lb

## 2023-05-31 DIAGNOSIS — Z0000 Encounter for general adult medical examination without abnormal findings: Secondary | ICD-10-CM | POA: Diagnosis not present

## 2023-06-12 ENCOUNTER — Ambulatory Visit
Admission: RE | Admit: 2023-06-12 | Discharge: 2023-06-12 | Disposition: A | Discharge status: Home / Self Care | Payer: 59 | Source: Ambulatory Visit | Attending: Critical Care Medicine | Admitting: Critical Care Medicine

## 2023-06-12 DIAGNOSIS — Z87891 Personal history of nicotine dependence: Secondary | ICD-10-CM | POA: Diagnosis not present

## 2023-06-12 DIAGNOSIS — J439 Emphysema, unspecified: Secondary | ICD-10-CM | POA: Diagnosis not present

## 2023-06-12 DIAGNOSIS — Z122 Encounter for screening for malignant neoplasm of respiratory organs: Secondary | ICD-10-CM | POA: Diagnosis not present

## 2023-06-12 DIAGNOSIS — I700 Atherosclerosis of aorta: Secondary | ICD-10-CM | POA: Diagnosis not present

## 2023-06-19 ENCOUNTER — Telehealth: Payer: Self-pay | Admitting: *Deleted

## 2023-06-19 NOTE — Telephone Encounter (Signed)
Noted. Routing to PCP.

## 2023-06-19 NOTE — Telephone Encounter (Signed)
Calling to verify report can be accessed:  IMPRESSION: 1. Lung-RADS 2S, benign appearance or behavior. Continue annual screening with low-dose chest CT without contrast in 12 months. 2. The "S" modifier above refers to potentially clinically significant non lung cancer related findings. Specifically, there is aortic atherosclerosis, in addition to left main and right coronary artery disease. Please note that although the presence of coronary artery calcium documents the presence of coronary artery disease, the severity of this disease and any potential stenosis cannot be assessed on this non-gated CT examination. Assessment for potential risk factor modification, dietary therapy or pharmacologic therapy may be warranted, if clinically indicated. 3. Mild diffuse bronchial wall thickening with very mild centrilobular and paraseptal emphysema; imaging findings suggestive of underlying COPD. 4. Destructive lesion with surrounding soft tissue prominence and a small amount of gas centered in the mid sternum. The appearance is unusual and very aggressive, concerning for a focus of osteomyelitis. Further clinical evaluation is recommended.  These results will be called to the ordering clinician or representative by the Radiologist Assistant, and communication documented in the PACS or [employer].  Aortic Atherosclerosis (ICD10-I70.0) and Emphysema (ICD10-J43.9).   Electronically Signed By: Trudie Reed M.D. On: 06/19/2023 11:32

## 2023-06-19 NOTE — Telephone Encounter (Signed)
Jordan [name] is calling to report there was an addendum to report- this has been added: ADDENDUM REPORT: 06/19/2023 14:15  ADDENDUM: As mentioned in the body of the report there is circumferential mass-like thickening of the distal esophagus immediately above the level of the gastroesophageal junction. This may simply reflect reflux esophagitis, however, underlying esophageal neoplasm is not excluded. Further evaluation with nonemergent endoscopy should be considered to better evaluate this finding in the near future and exclude neoplasm.  These results will be called to the ordering clinician or representative by the Radiologist Assistant, and communication documented in the PACS or [employer].   Electronically Signed By: Trudie Reed M.D. On: 06/19/2023 14:15

## 2023-06-20 NOTE — Telephone Encounter (Signed)
I have tried numerous times to reach this patient with no answer  He has a critical result on his CT scan  Please call him and tell him he MUST answer his phone if he gets a call from [phone]  I am traveling and wont be available u til after 1p.

## 2023-06-21 ENCOUNTER — Telehealth: Payer: Self-pay | Admitting: Critical Care Medicine

## 2023-06-21 ENCOUNTER — Telehealth: Payer: Self-pay | Admitting: Internal Medicine

## 2023-06-21 DIAGNOSIS — K2289 Other specified disease of esophagus: Secondary | ICD-10-CM

## 2023-06-21 NOTE — Telephone Encounter (Signed)
Noted  

## 2023-06-21 NOTE — Telephone Encounter (Signed)
Urgent referral in [facility] for Esophageal mass, history of colon cancer.  Patient has previously been seen by Dr. Leone Payor and Dr. Meridee Score. Please review and advise scheduling and urgency.  Thanks

## 2023-06-21 NOTE — Telephone Encounter (Signed)
I finally got the patient to answer his phone.  He understands the findings on his CT in the urgent nature to be evaluated  A referral to [facility] gastroenterology was made he will need an endoscopy

## 2023-06-21 NOTE — Telephone Encounter (Signed)
Left message for pt to call back  °

## 2023-06-21 NOTE — Telephone Encounter (Signed)
See message from today, provider has gotten in contact with patient

## 2023-06-22 NOTE — Telephone Encounter (Signed)
Scheduled EGD with patient for 06/30/23 at 9:30 am with Dr. Leone Payor. PV tomorrow at 8:00 am. No blood thinners/diabetic.

## 2023-06-22 NOTE — Telephone Encounter (Signed)
Called preferred number & spoke with patient's mother who asked that I called his cell phone. Left voicemail for patient to call back.

## 2023-06-23 ENCOUNTER — Ambulatory Visit: Payer: 59 | Admitting: *Deleted

## 2023-06-23 VITALS — Ht 71.0 in | Wt 175.0 lb

## 2023-06-23 DIAGNOSIS — K2289 Other specified disease of esophagus: Secondary | ICD-10-CM

## 2023-06-23 NOTE — Progress Notes (Signed)
Pt's name and DOB verified at the beginning of the pre-visit.  Pt denies any difficulty with ambulating,sitting, laying down or rolling side to side Gave both [facility] main # and MD on call # prior to instructions.  No egg or soy allergy known to patient  No issues known to pt with past sedation with any surgeries or procedures Pt denies having issues being intubated Pt has no issues moving head neck but has some issues with swallowing No FH of Malignant Hyperthermia Pt is not on diet pills Pt is not on home 02  Pt is not on blood thinners  Pt denies issues with constipation  Pt is not on dialysis Pt denise any abnormal heart rhythms  Pt denies any upcoming cardiac testing Pt encouraged to use to use Singlecare or Goodrx to reduce cost  Patient's chart reviewed by Cathlyn Parsons CNRA prior to pre-visit and patient appropriate for the [facility].  Pre-visit completed and red dot placed by patient's name on their procedure day (on provider's schedule).  . Visit by phone Pt states weight is 175 lb Instructed pt why it is important to and  to call if they have any changes in health or new medications. Directed them to the # given and on instructions.   Pt states they will.  Instructions reviewed with pt and pt states understanding. Instructed to review again prior to procedure. Pt states they will.  Instructions sent by mail with coupon and by my chart Pt states has had recent episode of sleep walking  Instructed not to do Cocaine day before or day of procedure

## 2023-06-25 ENCOUNTER — Encounter: Payer: Self-pay | Admitting: Certified Registered Nurse Anesthetist

## 2023-06-29 ENCOUNTER — Other Ambulatory Visit: Payer: Self-pay

## 2023-06-30 ENCOUNTER — Ambulatory Visit (AMBULATORY_SURGERY_CENTER): Payer: 59 | Admitting: Internal Medicine

## 2023-06-30 ENCOUNTER — Encounter: Payer: Self-pay | Admitting: Internal Medicine

## 2023-06-30 ENCOUNTER — Other Ambulatory Visit (INDEPENDENT_AMBULATORY_CARE_PROVIDER_SITE_OTHER): Payer: 59

## 2023-06-30 VITALS — BP 128/88 | HR 88 | Temp 97.1°F | Resp 18 | Ht 71.0 in | Wt 175.0 lb

## 2023-06-30 DIAGNOSIS — K2289 Other specified disease of esophagus: Secondary | ICD-10-CM | POA: Diagnosis not present

## 2023-06-30 DIAGNOSIS — C153 Malignant neoplasm of upper third of esophagus: Secondary | ICD-10-CM | POA: Diagnosis not present

## 2023-06-30 DIAGNOSIS — C155 Malignant neoplasm of lower third of esophagus: Secondary | ICD-10-CM | POA: Diagnosis not present

## 2023-06-30 DIAGNOSIS — C159 Malignant neoplasm of esophagus, unspecified: Secondary | ICD-10-CM | POA: Diagnosis not present

## 2023-06-30 LAB — CBC
HCT: 46.8 % (ref 39.0–52.0)
Hemoglobin: 15.4 g/dL (ref 13.0–17.0)
MCHC: 32.9 g/dL (ref 30.0–36.0)
MCV: 88 fl (ref 78.0–100.0)
Platelets: 271 10*3/uL (ref 150.0–400.0)
RBC: 5.32 Mil/uL (ref 4.22–5.81)
RDW: 14.7 % (ref 11.5–15.5)
WBC: 5.9 10*3/uL (ref 4.0–10.5)

## 2023-06-30 LAB — COMPREHENSIVE METABOLIC PANEL
ALT: 12 U/L (ref 0–53)
AST: 14 U/L (ref 0–37)
Albumin: 3.9 g/dL (ref 3.5–5.2)
Alkaline Phosphatase: 84 U/L (ref 39–117)
BUN: 31 mg/dL — ABNORMAL HIGH (ref 6–23)
CO2: 25 mEq/L (ref 19–32)
Calcium: 8.8 mg/dL (ref 8.4–10.5)
Chloride: 99 mEq/L (ref 96–112)
Creatinine, Ser: 1.86 mg/dL — ABNORMAL HIGH (ref 0.40–1.50)
GFR: 36.77 mL/min — ABNORMAL LOW (ref >60.00)
Glucose, Bld: 102 mg/dL — ABNORMAL HIGH (ref 70–99)
Potassium: 4.1 mEq/L (ref 3.5–5.1)
Sodium: 132 mEq/L — ABNORMAL LOW (ref 135–145)
Total Bilirubin: 0.5 mg/dL (ref 0.2–1.2)
Total Protein: 6.7 g/dL (ref 6.0–8.3)

## 2023-06-30 MED ORDER — SODIUM CHLORIDE 0.9 % IV SOLN: 500.0000 mL | Freq: Once | INTRAVENOUS | Status: DC

## 2023-06-30 NOTE — Progress Notes (Signed)
Report given to PACU, vss 

## 2023-06-30 NOTE — Patient Instructions (Addendum)
There are two areas in the esophagus that do look like cancer. I took biopsies and will let you know the results - we will then determine next steps.  I am drawing some labs today.  I appreciate the opportunity to care for you. Iva Boop, MD, [facility]  Resume previous diet Continue present medications Await pathology results- Sent [facility] today in the basement before discharge home, we will transport you there prior to discharge Handouts/information given for Hiatal Hernia  YOU HAD AN ENDOSCOPIC PROCEDURE TODAY AT THE [facility]:   Refer to the procedure report that was given to you for any specific questions about what was found during the examination.  If the procedure report does not answer your questions, please call your gastroenterologist to clarify.  If you requested that your care partner not be given the details of your procedure findings, then the procedure report has been included in a sealed envelope for you to review at your convenience later.  YOU SHOULD EXPECT: Some feelings of bloating in the abdomen. Passage of more gas than usual.  Walking can help get rid of the air that was put into your GI tract during the procedure and reduce the bloating. If you had a lower endoscopy (such as a colonoscopy or flexible sigmoidoscopy) you may notice spotting of blood in your stool or on the toilet paper. If you underwent a bowel prep for your procedure, you may not have a normal bowel movement for a few days.  Please Note:  You might notice some irritation and congestion in your nose or some drainage.  This is from the oxygen used during your procedure.  There is no need for concern and it should clear up in a day or so.  SYMPTOMS TO REPORT IMMEDIATELY:  Following upper endoscopy (EGD)  Vomiting of blood or coffee ground material  New chest pain or pain under the shoulder blades  Painful or persistently difficult swallowing  New shortness of breath  Fever of 100F or  higher  Black, tarry-looking stools  For urgent or emergent issues, a gastroenterologist can be reached at any hour by calling [phone]. Do not use MyChart messaging for urgent concerns.   DIET:  We do recommend a small meal at first, but then you may proceed to your regular diet.  Drink plenty of fluids but you should avoid alcoholic beverages for 24 hours.  ACTIVITY:  You should plan to take it easy for the rest of today and you should NOT DRIVE or use heavy machinery until tomorrow (because of the sedation medicines used during the test).    FOLLOW UP: Our staff will call the number listed on your records the next business day following your procedure.  We will call around 7:15- 8:00 am to check on you and address any questions or concerns that you may have regarding the information given to you following your procedure. If we do not reach you, we will leave a message.     If any biopsies were taken you will be contacted by phone or by letter within the next 1-3 weeks.  Please call us at [phone] if you have not heard about the biopsies in 3 weeks.    SIGNATURES/CONFIDENTIALITY: You and/or your care partner have signed paperwork which will be entered into your electronic medical record.  These signatures attest to the fact that that the information above on your After Visit Summary has been reviewed and is understood.  Full responsibility  of the confidentiality of this discharge information lies with you and/or your care-partner.

## 2023-06-30 NOTE — Op Note (Signed)
[facility] Patient Name: Jordan [name] Procedure Date: 06/30/2023 9:25 AM MRN: [id number] Endoscopist: Iva Boop , MD, [id number] Age: 67 Referring MD:  Date of Birth: [birth year] Gender: Male Account #: [id number] Procedure:                Upper GI endoscopy Indications:              Abnormal CT of the GI tract Medicines:                Monitored Anesthesia Care Procedure:                Pre-Anesthesia Assessment:                           - Prior to the procedure, a History and Physical                            was performed, and patient medications and                            allergies were reviewed. The patient's tolerance of                            previous anesthesia was also reviewed. The risks                            and benefits of the procedure and the sedation                            options and risks were discussed with the patient.                            All questions were answered, and informed consent                            was obtained. Prior Anticoagulants: The patient has                            taken no anticoagulant or antiplatelet agents. ASA                            Grade Assessment: III - A patient with severe                            systemic disease. After reviewing the risks and                            benefits, the patient was deemed in satisfactory                            condition to undergo the procedure.                           After obtaining informed consent, the endoscope was  passed under direct vision. Throughout the                            procedure, the patient's blood pressure, pulse, and                            oxygen saturations were monitored continuously. The                            Olympus Scope [id number] was introduced through the                            mouth, and advanced to the second part of duodenum.                            The upper GI  endoscopy was accomplished without                            difficulty. The patient tolerated the procedure                            well. Scope In: Scope Out: Findings:                 A large, submucosal mass + a smaller mass more                            proximal with no stigmata of recent bleeding was                            found in the distal esophagus, 30 and 35 cm from                            the incisors. The mass was partially obstructing                            and partially circumferential (involving two thirds                            of the lumen circumference). Biopsies were taken                            with a cold forceps for histology. Estimated blood                            loss was minimal.                           A 5 cm hiatal hernia was present.                           The exam was otherwise without abnormality.                           The cardia  and gastric fundus were normal on                            retroflexion. Complications:            No immediate complications. Estimated Blood Loss:     Estimated blood loss was minimal. 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                            Biopsied. Two masses - smaller more proximal at 30                            cm then 2/3 circumferential mass at 35 cm.                           - 5 cm hiatal hernia.                           - The examination was otherwise normal. Recommendation:           - Await pathology results.                           - Patient has a contact number available for                            emergencies. The signs and symptoms of potential                            delayed complications were discussed with the                            patient. Return to normal activities tomorrow.                            Written discharge instructions were  provided to the                            patient.                           - CBC, CMET and CEA ordered today Iva Boop, MD 06/30/2023 9:57:58 AM This report has been signed electronically.

## 2023-06-30 NOTE — Progress Notes (Signed)
Pt's states no medical or surgical changes since previsit or office visit. VS assessed by [facility]. Patient has an upper loose tooth- Charlsie Merles, CRNA is aware.

## 2023-06-30 NOTE — Progress Notes (Signed)
[facility] History and Physical   Primary Care Physician:  Storm Frisk, MD   Reason for Procedure:   Abnormal CT scan of esophagus  Plan:    EGD     HPI: Jordan [name] is a 67 y.o. male w/ abnormal CT scan as below No clear dysphagia  CT CHEST LUNG CA SCREEN LOW DOSE W/O CM Addendum: ADDENDUM REPORT: 06/19/2023 14:15   ADDENDUM:  As mentioned in the body of the report there is circumferential  mass-like thickening of the distal esophagus immediately above the  level of the gastroesophageal junction. This may simply reflect  reflux esophagitis, however, underlying esophageal neoplasm is not  excluded. Further evaluation with nonemergent endoscopy should be  considered to better evaluate this finding in the near future and  exclude neoplasm.   These results will be called to the ordering clinician or  representative by the Radiologist Assistant, and communication  documented in the PACS or [employer].   Electronically Signed    By: Trudie Reed M.D.    On: 06/19/2023 14:15 Narrative: CLINICAL DATA:  67 year old male smoker with 46 pack-year history of smoking. Lung cancer screening examination.  EXAM: CT CHEST WITHOUT CONTRAST LOW-DOSE FOR LUNG CANCER SCREENING  TECHNIQUE: Multidetector CT imaging of the chest was performed following the standard protocol without IV contrast.  RADIATION DOSE REDUCTION: This exam was performed according to the departmental dose-optimization program which includes automated exposure control, adjustment of the mA and/or kV according to patient size and/or use of iterative reconstruction technique.  COMPARISON:  Chest CT 04/03/2020.  FINDINGS: Cardiovascular: Heart size is normal. There is no significant pericardial fluid, thickening or pericardial calcification. There is aortic atherosclerosis, as well as atherosclerosis of the great vessels of the mediastinum and the coronary arteries,  including calcified atherosclerotic plaque in the left main and right coronary arteries. Calcifications of the aortic valve.  Mediastinum/Nodes: Small hiatal hernia. Circumferential thickening of the distal esophagus immediately above the level of the hiatal hernia, somewhat mass-like in appearance (axial image 46 of series 2) measuring approximately 2.9 x 3.0 cm. No axillary lymphadenopathy.  Lungs/Pleura: Small pulmonary nodule in the periphery of the left upper lobe (axial image 51), with a volume derived mean diameter of only 4.1 mm. No other larger more suspicious appearing pulmonary nodules or masses are noted. No acute consolidative airspace disease. No pleural effusions. Mild diffuse bronchial wall thickening with very mild centrilobular and paraseptal emphysema.  Upper Abdomen: Aortic atherosclerosis.  Musculoskeletal: Expansile lytic appearing lesion in the mid sternum (axial image 26 of series 2 and sagittal image 183 of series 6) estimated to measure approximately 4.0 x 2.2 x 2.5 cm, which has surrounding soft tissue prominence and small amount of gas in the mid sternum. There are no aggressive appearing lytic or blastic lesions noted in the visualized portions of the skeleton.  IMPRESSION: 1. Lung-RADS 2S, benign appearance or behavior. Continue annual screening with low-dose chest CT without contrast in 12 months. 2. The "S" modifier above refers to potentially clinically significant non lung cancer related findings. Specifically, there is aortic atherosclerosis, in addition to left main and right coronary artery disease. Please note that although the presence of coronary artery calcium documents the presence of coronary artery disease, the severity of this disease and any potential stenosis cannot be assessed on this non-gated CT examination. Assessment for potential risk factor modification, dietary therapy or pharmacologic therapy may be warranted, if clinically  indicated. 3. Mild diffuse bronchial wall thickening with  very mild centrilobular and paraseptal emphysema; imaging findings suggestive of underlying COPD. 4. Destructive lesion with surrounding soft tissue prominence and a small amount of gas centered in the mid sternum. The appearance is unusual and very aggressive, concerning for a focus of osteomyelitis. Further clinical evaluation is recommended.  These results will be called to the ordering clinician or representative by the Radiologist Assistant, and communication documented in the PACS or [employer].  Aortic Atherosclerosis (ICD10-I70.0) and Emphysema (ICD10-J43.9).  Electronically Signed: By: Trudie Reed M.D. On: 06/19/2023 11:32   Past Medical History:  Diagnosis Date   Allergy    seasonal   Aneurysm of infrarenal abdominal aorta (HCC)    3.4 cm per angela mcclung pa 04-16-2020 office note   Arthritis    Colon cancer (HCC)    COPD (chronic obstructive pulmonary disease) (HCC)    Family history of adverse reaction to anesthesia    mother slow to awaken   GERD (gastroesophageal reflux disease)    Hepatitis    hepatitis c dx in remission per pt   Hypertension    Inguinal hernia    Multiple fractures 06/04/2020   Positive colorectal cancer screening using Cologuard test 08/27/2020   Substance abuse (HCC)    Wears glasses    for reading    Past Surgical History:  Procedure Laterality Date   COLONOSCOPY  11/11/2020   ENDOSCOPIC MUCOSAL RESECTION N/A 03/10/2021   Procedure: ENDOSCOPIC MUCOSAL RESECTION;  Surgeon: Lemar Lofty., MD;  Location: [facility];  Service: Gastroenterology;  Laterality: N/A;   FLEXIBLE SIGMOIDOSCOPY N/A 03/10/2021   Procedure: FLEXIBLE SIGMOIDOSCOPY;  Surgeon: Meridee Score Netty Starring., MD;  Location: [facility];  Service: Gastroenterology;  Laterality: N/A;   HEMOSTASIS CLIP PLACEMENT  03/10/2021   Procedure: HEMOSTASIS CLIP PLACEMENT;  Surgeon: Lemar Lofty., MD;  Location: [facility];  Service: Gastroenterology;;   INGUINAL HERNIA REPAIR Right 01/19/2021   Procedure: LAPAROSCOPIC RIGHT INGUINAL HERNIA REPAIR WITH MESH;  Surgeon: Stechschulte, Hyman Hopes, MD;  Location[facility];  Service: General;  Laterality: Right;   obstruction removed as a child   swallowed a peanut   POLYPECTOMY  03/10/2021   Procedure: POLYPECTOMY;  Surgeon: Mansouraty, Netty Starring., MD;  Location: [facility];  Service: Gastroenterology;;   POLYPECTOMY     SUBMUCOSAL LIFTING INJECTION  03/10/2021   Procedure: SUBMUCOSAL LIFTING INJECTION;  Surgeon: Lemar Lofty., MD;  Location: [facility];  Service: Gastroenterology;;    Prior to Admission medications   Medication Sig Start Date End Date Taking? Authorizing Provider  Albuterol Sulfate (PROAIR RESPICLICK) 108 (90 Base) MCG/ACT AEPB INHALE 2 PUFFS INTO THE LUNGS EVERY 6 (SIX) HOURS AS NEEDED. 10/11/22 10/11/23 Yes Storm Frisk, MD  fluticasone-salmeterol (ADVAIR HFA) 115-21 MCG/ACT inhaler Inhale 2 puffs into the lungs 2 (two) times daily. 05/16/23  Yes Storm Frisk, MD  Multiple Vitamin (MULTIVITAMIN ADULT PO) Take by mouth.   Yes [provider]  omeprazole (PRILOSEC) 40 MG capsule Take 1 capsule (40 mg total) by mouth daily before dinner. 01/11/23  Yes Storm Frisk, MD  sildenafil (VIAGRA) 100 MG tablet Take 1 tablet (100 mg total) by mouth daily as needed for erectile dysfunction. 10/11/22 10/11/23 Yes Storm Frisk, MD  valsartan-hydrochlorothiazide (DIOVAN-HCT) 320-25 MG tablet Take 1 tablet by mouth daily. 01/11/23  Yes Storm Frisk, MD  benzonatate (TESSALON) 100 MG capsule Take 1 capsule (100 mg total) by mouth 2 (two) times daily as needed for cough. Patient not taking:  Reported on 06/23/2023 05/16/23   Storm Frisk, MD    Current Outpatient Medications  Medication Sig Dispense Refill   Albuterol Sulfate (PROAIR RESPICLICK) 108 (90 Base) MCG/ACT AEPB  INHALE 2 PUFFS INTO THE LUNGS EVERY 6 (SIX) HOURS AS NEEDED. 1 each 4   fluticasone-salmeterol (ADVAIR HFA) 115-21 MCG/ACT inhaler Inhale 2 puffs into the lungs 2 (two) times daily. 12 g 12   Multiple Vitamin (MULTIVITAMIN ADULT PO) Take by mouth.     omeprazole (PRILOSEC) 40 MG capsule Take 1 capsule (40 mg total) by mouth daily before dinner. 60 capsule 3   sildenafil (VIAGRA) 100 MG tablet Take 1 tablet (100 mg total) by mouth daily as needed for erectile dysfunction. 10 tablet 3   valsartan-hydrochlorothiazide (DIOVAN-HCT) 320-25 MG tablet Take 1 tablet by mouth daily. 90 tablet 3   benzonatate (TESSALON) 100 MG capsule Take 1 capsule (100 mg total) by mouth 2 (two) times daily as needed for cough. (Patient not taking: Reported on 06/23/2023) 20 capsule 0   Current Facility-Administered Medications  Medication Dose Route Frequency Provider Last Rate Last Admin   0.9 %  sodium chloride infusion  500 mL Intravenous Once Iva Boop, MD        Allergies as of 06/30/2023   (No Known Allergies)    Family History  Problem Relation Age of Onset   Cancer Sister    Colon polyps Sister    Colon polyps Father    Stomach cancer Father 73   Colon polyps Brother    Colon cancer Neg Hx    Esophageal cancer Neg Hx    Rectal cancer Neg Hx    Inflammatory bowel disease Neg Hx    Liver disease Neg Hx    Pancreatic cancer Neg Hx     Social History   Socioeconomic History   Marital status: Divorced    Spouse name: Not on file   Number of children: Not on file   Years of education: Not on file   Highest education level: Not on file  Occupational History   Not on file  Tobacco Use   Smoking status: Former    Current packs/day: 0.00    Types: Cigarettes    Quit date: 03/15/2019    Years since quitting: 4.2   Smokeless tobacco: Never  Vaping Use   Vaping status: Never Used  Substance and Sexual Activity   Alcohol use: Not Currently    Alcohol/week: 1.0 standard drink of alcohol     Types: 1 Cans of beer per week    Comment: quit 2000   Drug use: Yes    Frequency: 1.0 times per week    Types: Cocaine    Comment: last use Wednesday 06/28/23   Sexual activity: Not on file  Other Topics Concern   Not on file  Social History Narrative   Not on file   Social Determinants of Health   Financial Resource Strain: Low Risk  (05/31/2023)   Overall Financial Resource Strain (CARDIA)    Difficulty of Paying Living Expenses: Not hard at all  Food Insecurity: No Food Insecurity (05/31/2023)   Hunger Vital Sign    Worried About Running Out of Food in the Last Year: Never true    Ran Out of Food in the Last Year: Never true  Transportation Needs: No Transportation Needs (05/31/2023)   PRAPARE - Administrator, Civil Service (Medical): No    Lack of Transportation (Non-Medical): No  Physical Activity: Insufficiently Active (  05/31/2023)   Exercise Vital Sign    Days of Exercise per Week: 3 days    Minutes of Exercise per Session: 30 min  Stress: No Stress Concern Present (05/31/2023)   [employer] of Occupational Health - Occupational Stress Questionnaire    Feeling of Stress : Not at all  Social Connections: Socially Isolated (05/31/2023)   Social Connection and Isolation Panel [NHANES]    Frequency of Communication with Friends and Family: More than three times a week    Frequency of Social Gatherings with Friends and Family: Twice a week    Attends Religious Services: Never    Database administrator or Organizations: No    Attends Banker Meetings: Never    Marital Status: Divorced  Catering manager Violence: Not At Risk (05/31/2023)   Humiliation, Afraid, Rape, and Kick questionnaire    Fear of Current or Ex-Partner: No    Emotionally Abused: No    Physically Abused: No    Sexually Abused: No    Review of Systems: Positive for cough All other review of systems negative except as mentioned in the HPI.  Physical Exam: Vital signs BP  135/68   Pulse 67   Temp (!) 97.1 F (36.2 C) (Temporal)   Ht 5\' 11"  (1.803 m)   Wt 175 lb (79.4 kg)   SpO2 95%   BMI 24.41 kg/m   General:   Alert,  Well-developed, well-nourished, pleasant and cooperative in NAD Lungs:  Clear throughout to auscultation.   Heart:  Regular rate and rhythm; no murmurs, clicks, rubs,  or gallops. Abdomen:  Soft, nontender and nondistended. Normal bowel sounds.   Neuro/Psych:  Alert and cooperative. Normal mood and affect. A and O x 3  Poor dentition w/ loose upper incisor and missing teeth @[facility]  Sena Slate, MD, [facility] [phone] (pager) 06/30/2023 9:29 AM@

## 2023-06-30 NOTE — Progress Notes (Signed)
Called to room to assist during endoscopic procedure.  Patient ID and intended procedure confirmed with present staff. Received instructions for my participation in the procedure from the performing physician.  

## 2023-07-02 NOTE — Progress Notes (Unsigned)
Patient: Jordan [name], Male    DOB: 10-15-1956, 67 y.o.   MRN: [id number]  Subjective  No chief complaint on file.    09/2022 Today the patient's main complaint is shortness of breath he is not been using his [employer] regularly also has some low back pain after heavy lifting this has been treated with over-the-counter medications.  On arrival blood pressure is also elevated turns 67/82.  He is still smoking about 3 to 4 cigarettes daily.  He agrees to and received the flu vaccine at this visit.  He also complains of left lower extremity numbness and claudication with exercise.  He has no prior history of noted vascular disease.  01/11/23 Patient seen in return follow-up and he had a cough for 3 weeks coughing up thick gray-green mucus.  He smokes an occasional 1 cigarette.  Hepatitis C has cleared.  He had a dermatologic lesion on the head dermatology said it was benign.  There are no other complaints.  He has multiple medications that need to be refilled at this visit.  There are no primary care gaps.  Patient did see vascular for our abnormal office ABI and there study was normal he has no vascular disease peripherally  8/19    Dental: Current dental problems patient has an appointment for dental care he needs multiple teeth extracted   Patient Active Problem List   Diagnosis Date Noted   Mucopurulent chronic bronchitis (HCC) 05/16/2023   Chronic hepatitis C without hepatic coma (HCC) 05/19/2022   Scalp lesion 05/10/2022   Adenocarcinoma of colon (HCC) 01/19/2021   Dental caries 11/23/2020   Unilateral inguinal hernia without obstruction or gangrene 10/27/2020   Erectile dysfunction 08/11/2020   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   Hypertension 06/04/2020   History of tobacco use 06/04/2020   Compression fracture of thoracic vertebra with routine healing 04/30/2020   Past Medical History:  Diagnosis Date   Allergy    seasonal   Aneurysm of infrarenal abdominal  aorta (HCC)    3.4 cm per angela mcclung pa 04-16-2020 office note   Arthritis    Colon cancer (HCC)    COPD (chronic obstructive pulmonary disease) (HCC)    Family history of adverse reaction to anesthesia    mother slow to awaken   GERD (gastroesophageal reflux disease)    Hepatitis    hepatitis c dx in remission per pt   Hypertension    Inguinal hernia    Multiple fractures 06/04/2020   Positive colorectal cancer screening using Cologuard test 08/27/2020   Substance abuse (HCC)    Wears glasses    for reading   Past Surgical History:  Procedure Laterality Date   COLONOSCOPY  11/11/2020   ENDOSCOPIC MUCOSAL RESECTION N/A 03/10/2021   Procedure: ENDOSCOPIC MUCOSAL RESECTION;  Surgeon: Lemar Lofty., MD;  Location: [facility];  Service: Gastroenterology;  Laterality: N/A;   FLEXIBLE SIGMOIDOSCOPY N/A 03/10/2021   Procedure: FLEXIBLE SIGMOIDOSCOPY;  Surgeon: Meridee Score Netty Starring., MD;  Location: [facility];  Service: Gastroenterology;  Laterality: N/A;   HEMOSTASIS CLIP PLACEMENT  03/10/2021   Procedure: HEMOSTASIS CLIP PLACEMENT;  Surgeon: Lemar Lofty., MD;  Location: [facility];  Service: Gastroenterology;;   INGUINAL HERNIA REPAIR Right 01/19/2021   Procedure: LAPAROSCOPIC RIGHT INGUINAL HERNIA REPAIR WITH MESH;  Surgeon: Stechschulte, Hyman Hopes, MD;  Location: [facility] [place];  Service: General;  Laterality: Right;   obstruction removed as a child   swallowed a peanut   POLYPECTOMY  03/10/2021   Procedure: POLYPECTOMY;  Surgeon: Lemar Lofty., MD;  Location: [facility];  Service: Gastroenterology;;   POLYPECTOMY     SUBMUCOSAL LIFTING INJECTION  03/10/2021   Procedure: SUBMUCOSAL LIFTING INJECTION;  Surgeon: Lemar Lofty., MD;  Location: [facility];  Service: Gastroenterology;;   Social History   Tobacco Use   Smoking status: Former    Current packs/day: 0.00    Types: Cigarettes    Quit date: 03/15/2019    Years  since quitting: 4.3   Smokeless tobacco: Never  Vaping Use   Vaping status: Never Used  Substance Use Topics   Alcohol use: Not Currently    Alcohol/week: 1.0 standard drink of alcohol    Types: 1 Cans of beer per week    Comment: quit 2000   Drug use: Yes    Frequency: 1.0 times per week    Types: Cocaine    Comment: last use Wednesday 06/28/23   Social History   Socioeconomic History   Marital status: Divorced    Spouse name: Not on file   Number of children: Not on file   Years of education: Not on file   Highest education level: Not on file  Occupational History   Not on file  Tobacco Use   Smoking status: Former    Current packs/day: 0.00    Types: Cigarettes    Quit date: 03/15/2019    Years since quitting: 4.3   Smokeless tobacco: Never  Vaping Use   Vaping status: Never Used  Substance and Sexual Activity   Alcohol use: Not Currently    Alcohol/week: 1.0 standard drink of alcohol    Types: 1 Cans of beer per week    Comment: quit 2000   Drug use: Yes    Frequency: 1.0 times per week    Types: Cocaine    Comment: last use Wednesday 06/28/23   Sexual activity: Not on file  Other Topics Concern   Not on file  Social History Narrative   Not on file   Social Determinants of Health   Financial Resource Strain: Low Risk  (05/31/2023)   Overall Financial Resource Strain (CARDIA)    Difficulty of Paying Living Expenses: Not hard at all  Food Insecurity: No Food Insecurity (05/31/2023)   Hunger Vital Sign    Worried About Running Out of Food in the Last Year: Never true    Ran Out of Food in the Last Year: Never true  Transportation Needs: No Transportation Needs (05/31/2023)   PRAPARE - Administrator, Civil Service (Medical): No    Lack of Transportation (Non-Medical): No  Physical Activity: Insufficiently Active (05/31/2023)   Exercise Vital Sign    Days of Exercise per Week: 3 days    Minutes of Exercise per Session: 30 min  Stress: No Stress  Concern Present (05/31/2023)   [employer] of Occupational Health - Occupational Stress Questionnaire    Feeling of Stress : Not at all  Social Connections: Socially Isolated (05/31/2023)   Social Connection and Isolation Panel [NHANES]    Frequency of Communication with Friends and Family: More than three times a week    Frequency of Social Gatherings with Friends and Family: Twice a week    Attends Religious Services: Never    Database administrator or Organizations: No    Attends Banker Meetings: Never    Marital Status: Divorced  Catering manager Violence: Not At Risk (05/31/2023)   Humiliation, Afraid, Rape, and  Kick questionnaire    Fear of Current or Ex-Partner: No    Emotionally Abused: No    Physically Abused: No    Sexually Abused: No   Family Status  Relation Name Status   Sister  Deceased   Father  (Not Specified)   Brother  (Not Specified)   Neg Hx  (Not Specified)  No partnership data on file   Family History  Problem Relation Age of Onset   Cancer Sister    Colon polyps Sister    Colon polyps Father    Stomach cancer Father 36   Colon polyps Brother    Colon cancer Neg Hx    Esophageal cancer Neg Hx    Rectal cancer Neg Hx    Inflammatory bowel disease Neg Hx    Liver disease Neg Hx    Pancreatic cancer Neg Hx    No Known Allergies    Medications: Outpatient Medications Prior to Visit  Medication Sig   Albuterol Sulfate (PROAIR RESPICLICK) 108 (90 Base) MCG/ACT AEPB INHALE 2 PUFFS INTO THE LUNGS EVERY 6 (SIX) HOURS AS NEEDED.   benzonatate (TESSALON) 100 MG capsule Take 1 capsule (100 mg total) by mouth 2 (two) times daily as needed for cough. (Patient not taking: Reported on 06/23/2023)   fluticasone-salmeterol (ADVAIR HFA) 115-21 MCG/ACT inhaler Inhale 2 puffs into the lungs 2 (two) times daily.   Multiple Vitamin (MULTIVITAMIN ADULT PO) Take by mouth.   omeprazole (PRILOSEC) 40 MG capsule Take 1 capsule (40 mg total) by mouth daily  before dinner.   sildenafil (VIAGRA) 100 MG tablet Take 1 tablet (100 mg total) by mouth daily as needed for erectile dysfunction.   valsartan-hydrochlorothiazide (DIOVAN-HCT) 320-25 MG tablet Take 1 tablet by mouth daily.   No facility-administered medications prior to visit.    No Known Allergies  Patient Care Team: Storm Frisk, MD as PCP - General (Pulmonary Disease) Donzetta Starch, MD as Consulting Physician (Dermatology) Veryl Speak, FNP as Nurse Practitioner (Infectious Diseases) Forestine Na (Physician Assistant) Bradly Bienenstock, MD as Consulting Physician (Orthopedic Surgery)  Review of Systems  Constitutional:  Negative for chills, diaphoresis, fever, malaise/fatigue and weight loss.  HENT:  Negative for congestion, hearing loss, nosebleeds, sore throat and tinnitus.   Eyes:  Negative for blurred vision, photophobia and redness.  Respiratory:  Positive for cough, sputum production and shortness of breath. Negative for hemoptysis, wheezing and stridor.   Cardiovascular:  Negative for chest pain, palpitations, orthopnea, claudication, leg swelling and PND.  Gastrointestinal:  Negative for abdominal pain, blood in stool, constipation, diarrhea, heartburn, nausea and vomiting.  Genitourinary:  Negative for dysuria, flank pain, frequency, hematuria and urgency.  Musculoskeletal:  Negative for back pain, falls, joint pain, myalgias and neck pain.  Skin:  Negative for itching and rash.  Neurological:  Negative for dizziness, tingling, tremors, sensory change, speech change, focal weakness, seizures, loss of consciousness, weakness and headaches.  Endo/Heme/Allergies:  Negative for environmental allergies and polydipsia. Does not bruise/bleed easily.  Psychiatric/Behavioral:  Negative for depression, memory loss, substance abuse and suicidal ideas. The patient is not nervous/anxious and does not have insomnia.         Objective  There were no vitals taken for this  visit. BP Readings from Last 3 Encounters:  06/30/23 128/88  05/16/23 128/78  01/11/23 124/66   Wt Readings from Last 3 Encounters:  06/30/23 175 lb (79.4 kg)  06/23/23 175 lb (79.4 kg)  05/31/23 179 lb (81.2 kg)  Physical Exam Vitals reviewed.  Constitutional:      Appearance: Normal appearance. He is well-developed. He is not diaphoretic.  HENT:     Head: Normocephalic and atraumatic.     Nose: Congestion and rhinorrhea present. No nasal deformity, septal deviation or mucosal edema.     Right Sinus: No maxillary sinus tenderness or frontal sinus tenderness.     Left Sinus: No maxillary sinus tenderness or frontal sinus tenderness.     Comments: Nasal purulence    Mouth/Throat:     Mouth: Mucous membranes are moist.     Pharynx: No oropharyngeal exudate.     Comments: Poor dentition Eyes:     General: No scleral icterus.    Conjunctiva/sclera: Conjunctivae normal.     Pupils: Pupils are equal, round, and reactive to light.  Neck:     Thyroid: No thyromegaly.     Vascular: No carotid bruit or JVD.     Trachea: Trachea normal. No tracheal tenderness or tracheal deviation.  Cardiovascular:     Rate and Rhythm: Normal rate and regular rhythm.     Chest Wall: PMI is not displaced.     Pulses: No decreased pulses.     Heart sounds: Normal heart sounds, S1 normal and S2 normal. Heart sounds not distant. No murmur heard.    No systolic murmur is present.     No diastolic murmur is present.     No friction rub. No gallop. No S3 or S4 sounds.     Comments: Decreased pulses left lower extremity Pulmonary:     Effort: Pulmonary effort is normal. No tachypnea, accessory muscle usage or respiratory distress.     Breath sounds: No stridor. Wheezing and rhonchi present. No decreased breath sounds or rales.     Comments: Distant breath sounds Chest:     Chest wall: No tenderness.  Abdominal:     General: Bowel sounds are normal. There is no distension.     Palpations: Abdomen  is soft. Abdomen is not rigid.     Tenderness: There is no abdominal tenderness. There is no guarding or rebound.  Musculoskeletal:        General: Normal range of motion.     Cervical back: Normal range of motion and neck supple. No edema, erythema or rigidity. No muscular tenderness. Normal range of motion.  Lymphadenopathy:     Head:     Right side of head: No submental or submandibular adenopathy.     Left side of head: No submental or submandibular adenopathy.     Cervical: No cervical adenopathy.  Skin:    General: Skin is warm and dry.     Coloration: Skin is not pale.     Findings: No rash.     Nails: There is no clubbing.  Neurological:     General: No focal deficit present.     Mental Status: He is alert and oriented to person, place, and time.     Sensory: No sensory deficit.  Psychiatric:        Mood and Affect: Mood normal.        Speech: Speech normal.        Behavior: Behavior normal.        Thought Content: Thought content normal.        Judgment: Judgment normal.         Most recent depression screenings:    05/16/2023    1:49 PM 01/11/2023   10:18 AM  PHQ 2/9 Scores  PHQ - 2 Score 0 0  PHQ- 9 Score  0   Most recent cognitive screening:    05/31/2023    2:54 PM  6CIT Screen  What Year? 0 points  What month? 0 points  What time? 0 points  Count back from 20 0 points  Months in reverse 2 points  Repeat phrase 0 points  Total Score 2 points   Most recent Audit-C alcohol use screening    05/31/2023    2:50 PM  Alcohol Use Disorder Test (AUDIT)  1. How often do you have a drink containing alcohol? 3  2. How many drinks containing alcohol do you have on a typical day when you are drinking? 0  3. How often do you have six or more drinks on one occasion? 0  AUDIT-C Score 3   A score of 3 or more in women, and 4 or more in men indicates increased risk for alcohol abuse, EXCEPT if all of the points are from question 1   Vision/Hearing Screen: No  results found.  Last CBC Lab Results  Component Value Date   WBC 5.9 06/30/2023   HGB 15.4 06/30/2023   HCT 46.8 06/30/2023   MCV 88.0 06/30/2023   MCH 28.6 05/16/2023   RDW 14.7 06/30/2023   PLT 271.0 06/30/2023   Last metabolic panel Lab Results  Component Value Date   GLUCOSE 102 (H) 06/30/2023   NA 132 (L) 06/30/2023   K 4.1 06/30/2023   CL 99 06/30/2023   CO2 25 06/30/2023   BUN 31 (H) 06/30/2023   CREATININE 1.86 (H) 06/30/2023   EGFR 44 (L) 05/16/2023   CALCIUM 8.8 06/30/2023   PROT 6.7 06/30/2023   ALBUMIN 3.9 06/30/2023   LABGLOB 2.5 05/16/2023   AGRATIO 1.2 01/10/2022   BILITOT 0.5 06/30/2023   ALKPHOS 84 06/30/2023   AST 14 06/30/2023   ALT 12 06/30/2023   ANIONGAP 8 01/18/2021   Last lipids Lab Results  Component Value Date   CHOL 164 01/10/2022   HDL 38 (L) 01/10/2022   LDLCALC 84 01/10/2022   TRIG 250 (H) 01/10/2022   CHOLHDL 4.3 01/10/2022   Last hemoglobin A1c No results found for: "HGBA1C" Last thyroid functions No results found for: "TSH", "T3TOTAL", "T4TOTAL", "THYROIDAB" Last vitamin D No results found for: "25OHVITD2", "25OHVITD3", "VD25OH" Last vitamin B12 and Folate No results found for: "VITAMINB12", "FOLATE"    No results found for any visits on 07/04/23.    Immunization History  Administered Date(s) Administered   Fluad Quad(high Dose 65+) 10/11/2022   PFIZER(Purple Top)SARS-COV-2 Vaccination 02/29/2020, 03/25/2020   Pneumococcal Conjugate-13 06/04/2020   Pneumococcal Polysaccharide-23 06/07/2021   Tdap 06/04/2020   Zoster Recombinant(Shingrix) 06/22/2021, 09/07/2021    Health Maintenance  Topic Date Due   INFLUENZA VACCINE  06/15/2023   Colonoscopy  10/30/2023   Medicare Annual Wellness (AWV)  05/30/2024   DTaP/Tdap/Td (2 - Td or Tdap) 06/04/2030   Pneumonia Vaccine 59+ Years old  Completed   Hepatitis C Screening  Completed   Zoster Vaccines- Shingrix  Completed   HPV VACCINES  Aged Out   COVID-19 Vaccine   Discontinued   Fecal DNA (Cologuard)  Discontinued      Problem List Items Addressed This Visit   None   No follow-ups on file.     Shan Levans, MD

## 2023-07-03 ENCOUNTER — Telehealth: Payer: Self-pay

## 2023-07-03 LAB — CEA: CEA: 6.5 ng/mL — ABNORMAL HIGH

## 2023-07-03 NOTE — Telephone Encounter (Signed)
  Follow up Call-     06/30/2023    8:52 AM 10/29/2021    9:58 AM 11/24/2020   10:52 AM 11/11/2020    9:43 AM  Call back number  Post procedure Call Back phone  # [phone]  Permission to leave phone message Yes Yes Yes Yes     Patient questions:  Do you have a fever, pain , or abdominal swelling? No. Pain Score  0 *  Have you tolerated food without any problems? Yes.    Have you been able to return to your normal activities? Yes.    Do you have any questions about your discharge instructions: Diet   No. Medications  No. Follow up visit  No.  Do you have questions or concerns about your Care? No.  Actions: * If pain score is 4 or above: No action needed, pain <4.

## 2023-07-04 ENCOUNTER — Other Ambulatory Visit: Payer: Self-pay

## 2023-07-04 ENCOUNTER — Ambulatory Visit: Payer: 59 | Admitting: Critical Care Medicine

## 2023-07-04 ENCOUNTER — Ambulatory Visit: Payer: 59 | Attending: Critical Care Medicine | Admitting: Critical Care Medicine

## 2023-07-04 ENCOUNTER — Encounter: Payer: Self-pay | Admitting: Critical Care Medicine

## 2023-07-04 VITALS — BP 121/71 | HR 74 | Wt 173.6 lb

## 2023-07-04 DIAGNOSIS — K2289 Other specified disease of esophagus: Secondary | ICD-10-CM | POA: Diagnosis not present

## 2023-07-04 DIAGNOSIS — N1832 Chronic kidney disease, stage 3b: Secondary | ICD-10-CM | POA: Diagnosis not present

## 2023-07-04 DIAGNOSIS — I10 Essential (primary) hypertension: Secondary | ICD-10-CM

## 2023-07-04 MED ORDER — ALBUTEROL SULFATE HFA 108 (90 BASE) MCG/ACT IN AERS
2.0000 | INHALATION_SPRAY | Freq: Four times a day (QID) | RESPIRATORY_TRACT | 2 refills | Status: DC | PRN
  Filled 2023-07-04: qty 18, 25d supply, fill #0

## 2023-07-04 MED ORDER — PROMETHAZINE-DM 6.25-15 MG/5ML PO SYRP
5.0000 mL | ORAL_SOLUTION | Freq: Four times a day (QID) | ORAL | 1 refills | Status: DC | PRN
Start: 2023-07-04 — End: 2023-11-22
  Filled 2023-07-04: qty 240, 12d supply, fill #0
  Filled 2023-07-13: qty 240, 12d supply, fill #1

## 2023-07-04 MED ORDER — AMLODIPINE BESYLATE 10 MG PO TABS
10.0000 mg | ORAL_TABLET | Freq: Every day | ORAL | 2 refills | Status: DC
  Filled 2023-07-04 – 2023-08-21 (×2): qty 90, 90d supply, fill #0

## 2023-07-04 MED ORDER — OMEPRAZOLE 40 MG PO CPDR
40.0000 mg | DELAYED_RELEASE_CAPSULE | Freq: Every day | ORAL | 3 refills | Status: DC
  Filled 2023-07-04 – 2023-08-21 (×2): qty 60, 60d supply, fill #0

## 2023-07-04 NOTE — Assessment & Plan Note (Signed)
Esophageal mass status post endoscopy likely esophageal cancer patient to follow-up with gastroenterology on further measures I told him likely would be some combination of oncology referral and thoracic surgery referral for potential resection based on staging

## 2023-07-04 NOTE — Assessment & Plan Note (Signed)
Progressive drop in GFR will need to discontinue valsartan 10 and switch to amlodipine

## 2023-07-04 NOTE — Patient Instructions (Signed)
Stop valsartan HCT  Start Amlodipine one daily  Return 3 weeks blood pressure check RN  Return primary care 4 months  Follow up with Dr Leone Payor for continued care of esophagus

## 2023-07-04 NOTE — Assessment & Plan Note (Signed)
Well-controlled but GFR is following while on valsartan hand will discontinue valsartan HCT and begin amlodipine daily follow-up metabolic panel

## 2023-07-06 ENCOUNTER — Telehealth: Payer: Self-pay | Admitting: Internal Medicine

## 2023-07-06 ENCOUNTER — Other Ambulatory Visit: Payer: Self-pay

## 2023-07-06 DIAGNOSIS — C159 Malignant neoplasm of esophagus, unspecified: Secondary | ICD-10-CM

## 2023-07-06 DIAGNOSIS — K2289 Other specified disease of esophagus: Secondary | ICD-10-CM

## 2023-07-06 NOTE — Telephone Encounter (Signed)
Tried to call on both numbers and no answer.  Please call again when able and explain that biopsies show esophageal cancer as we thought.  He needs  1) Oncology referral Dr. Truett Perna or Mosetta Putt  2) CT chest, abd and pelvis with contrast (dose adjusted for reduced GFR, if possible)  3) Let men know after you reach him

## 2023-07-06 NOTE — Telephone Encounter (Signed)
Please contact pathology and ask about results that are pending

## 2023-07-06 NOTE — Telephone Encounter (Signed)
Called and spoke with [facility] & they are going to call Dr. Kenard Gower today and ask that he review case as it has not been signed off & still "needs diagnosis."

## 2023-07-06 NOTE — Telephone Encounter (Signed)
Spoke with patient regarding results & MD recommendations. CT scheduled for 07/12/23 at 5:00 pm at [facility]. Pt will need to arrive at 3:00 pm, NPO 4 hours prior. Referral placed to oncology.

## 2023-07-12 ENCOUNTER — Ambulatory Visit (HOSPITAL_COMMUNITY)
Admission: RE | Admit: 2023-07-12 | Discharge: 2023-07-12 | Disposition: A | Discharge status: Home / Self Care | Payer: 59 | Source: Ambulatory Visit | Attending: Internal Medicine | Admitting: Internal Medicine

## 2023-07-12 DIAGNOSIS — C159 Malignant neoplasm of esophagus, unspecified: Secondary | ICD-10-CM | POA: Diagnosis not present

## 2023-07-12 DIAGNOSIS — R918 Other nonspecific abnormal finding of lung field: Secondary | ICD-10-CM | POA: Diagnosis not present

## 2023-07-12 DIAGNOSIS — I7143 Infrarenal abdominal aortic aneurysm, without rupture: Secondary | ICD-10-CM | POA: Diagnosis not present

## 2023-07-12 DIAGNOSIS — K2289 Other specified disease of esophagus: Secondary | ICD-10-CM | POA: Diagnosis not present

## 2023-07-12 LAB — POCT I-STAT CREATININE: Creatinine, Ser: 1.6 mg/dL — ABNORMAL HIGH (ref 0.61–1.24)

## 2023-07-12 MED ORDER — IOHEXOL 300 MG/ML  SOLN
100.0000 mL | Freq: Once | INTRAMUSCULAR | Status: AC | PRN
  Administered 2023-07-12: 80 mL via INTRAVENOUS

## 2023-07-12 MED ORDER — IOHEXOL 9 MG/ML PO SOLN
1000.0000 mL | Freq: Once | ORAL | Status: AC
  Administered 2023-07-12: 1000 mL via ORAL

## 2023-07-13 ENCOUNTER — Inpatient Hospital Stay: Payer: 59 | Attending: Hematology | Admitting: Hematology

## 2023-07-13 ENCOUNTER — Encounter: Payer: Self-pay | Admitting: Hematology

## 2023-07-13 ENCOUNTER — Inpatient Hospital Stay: Payer: 59

## 2023-07-13 VITALS — BP 145/83 | HR 77 | Temp 97.7°F | Resp 18 | Ht 71.0 in | Wt 173.8 lb

## 2023-07-13 DIAGNOSIS — I7143 Infrarenal abdominal aortic aneurysm, without rupture: Secondary | ICD-10-CM | POA: Diagnosis not present

## 2023-07-13 DIAGNOSIS — F1410 Cocaine abuse, uncomplicated: Secondary | ICD-10-CM | POA: Diagnosis not present

## 2023-07-13 DIAGNOSIS — I10 Essential (primary) hypertension: Secondary | ICD-10-CM | POA: Diagnosis not present

## 2023-07-13 DIAGNOSIS — K449 Diaphragmatic hernia without obstruction or gangrene: Secondary | ICD-10-CM | POA: Diagnosis not present

## 2023-07-13 DIAGNOSIS — C155 Malignant neoplasm of lower third of esophagus: Secondary | ICD-10-CM | POA: Insufficient documentation

## 2023-07-13 DIAGNOSIS — I700 Atherosclerosis of aorta: Secondary | ICD-10-CM | POA: Diagnosis not present

## 2023-07-13 DIAGNOSIS — J449 Chronic obstructive pulmonary disease, unspecified: Secondary | ICD-10-CM | POA: Insufficient documentation

## 2023-07-13 DIAGNOSIS — Z86010 Personal history of colonic polyps: Secondary | ICD-10-CM | POA: Diagnosis not present

## 2023-07-13 DIAGNOSIS — Z79899 Other long term (current) drug therapy: Secondary | ICD-10-CM | POA: Diagnosis not present

## 2023-07-13 DIAGNOSIS — C189 Malignant neoplasm of colon, unspecified: Secondary | ICD-10-CM

## 2023-07-13 DIAGNOSIS — Z85038 Personal history of other malignant neoplasm of large intestine: Secondary | ICD-10-CM | POA: Diagnosis not present

## 2023-07-13 DIAGNOSIS — R911 Solitary pulmonary nodule: Secondary | ICD-10-CM | POA: Diagnosis not present

## 2023-07-13 DIAGNOSIS — K219 Gastro-esophageal reflux disease without esophagitis: Secondary | ICD-10-CM | POA: Insufficient documentation

## 2023-07-13 DIAGNOSIS — Z87891 Personal history of nicotine dependence: Secondary | ICD-10-CM | POA: Diagnosis not present

## 2023-07-13 DIAGNOSIS — Z800 Family history of malignant neoplasm of digestive organs: Secondary | ICD-10-CM | POA: Insufficient documentation

## 2023-07-13 DIAGNOSIS — C159 Malignant neoplasm of esophagus, unspecified: Secondary | ICD-10-CM | POA: Insufficient documentation

## 2023-07-13 NOTE — Progress Notes (Signed)
[facility]   [phone] [phone]   Clinic New Consult Note   Patient Care Team: Storm Frisk, MD as PCP - General (Pulmonary Disease) Donzetta Starch, MD as Consulting Physician (Dermatology) Veryl Speak, FNP as Nurse Practitioner (Infectious Diseases) Forestine Na (Physician Assistant) Bradly Bienenstock, MD as Consulting Physician (Orthopedic Surgery) Malachy Mood, MD as Consulting Physician (Hematology and Oncology) 07/13/2023  CHIEF COMPLAINTS/PURPOSE OF CONSULTATION:  Newly diagnosed esophageal cancer  Referring physician: GI Dr. Leone Payor  HISTORY OF PRESENTING ILLNESS:  Jordan [name] 67 y.o. male with past medical history of COPD, stage I colon cancer, hypertension, cocaine abuse, is here because of newly diagnosed esophageal cancer.  He was referred by his gastroenterologist Dr. Leonette Monarch.  He presented to the clinic with his brother in the mother.  Due to history of heavy smoking and COPD, he underwent CT chest for lung cancer screening on June 12, 2023.  CT scan showed circumferential thickening of the distal esophagus immediately above the level of the hiatal hernia, somewhat masslike appearance measuring 2.9 x 3.0 centimeter.  No axillary or mediastinal adenopathy.  He was referred to GI for further evaluation.  He had a history of colon polyps, and a very small early stage colon adenocarcinoma arising from adenoma which was endoscopy removed in December 2021.  Last colonoscopy was in December 2022.  He underwent a EGD evaluation on June 30, 2023, which showed a large submucosal mass plus a small mass was found in the distal esophagus, 30 and 35 cm from the incisors.  Biopsy of the posterior mass showed moderate to poorly differentiated carcinoma.   Patient denies any dysphagia or odynophagia.  Due to his COPD and previous heavy smoking history, and ongoing cocaine smoking, he has chronic dry cough, which causes some chest pain.  His  appetite is normal, but he has lost about 10 pounds in the past 3 to 4 months.  He has mild fatigue, able to function fully.  No issue with bowel movement, denies any melena or GI bleeding.  Socially he is divorced, has no children.  He lives with his mother.  He is retired, but still does some yard work.  MEDICAL HISTORY:  Past Medical History:  Diagnosis Date   Allergy    seasonal   Aneurysm of infrarenal abdominal aorta (HCC)    3.4 cm per angela mcclung pa 04-16-2020 office note   Arthritis    Colon cancer (HCC)    Compression fracture of thoracic vertebra with routine healing 04/30/2020   COPD (chronic obstructive pulmonary disease) (HCC)    Family history of adverse reaction to anesthesia    mother slow to awaken   GERD (gastroesophageal reflux disease)    Hepatitis    hepatitis c dx in remission per pt   Hypertension    Inguinal hernia    Multiple fractures 06/04/2020   Positive colorectal cancer screening using Cologuard test 08/27/2020   Substance abuse (HCC)    Wears glasses    for reading    SURGICAL HISTORY: Past Surgical History:  Procedure Laterality Date   COLONOSCOPY  11/11/2020   ENDOSCOPIC MUCOSAL RESECTION N/A 03/10/2021   Procedure: ENDOSCOPIC MUCOSAL RESECTION;  Surgeon: Lemar Lofty., MD;  Location: [facility];  Service: Gastroenterology;  Laterality: N/A;   FLEXIBLE SIGMOIDOSCOPY N/A 03/10/2021   Procedure: FLEXIBLE SIGMOIDOSCOPY;  Surgeon: Meridee Score Netty Starring., MD;  Location: [facility];  Service: Gastroenterology;  Laterality: N/A;   HEMOSTASIS CLIP PLACEMENT  03/10/2021  Procedure: HEMOSTASIS CLIP PLACEMENT;  Surgeon: Lemar Lofty., MD;  Location: [facility];  Service: Gastroenterology;;   INGUINAL HERNIA REPAIR Right 01/19/2021   Procedure: LAPAROSCOPIC RIGHT INGUINAL HERNIA REPAIR WITH MESH;  Surgeon: Stechschulte, Hyman Hopes, MD;  Location: [facility];  Service: General;  Laterality: Right;   obstruction  removed as a child   swallowed a peanut   POLYPECTOMY  03/10/2021   Procedure: POLYPECTOMY;  Surgeon: Mansouraty, Netty Starring., MD;  Location: [facility];  Service: Gastroenterology;;   POLYPECTOMY     SUBMUCOSAL LIFTING INJECTION  03/10/2021   Procedure: SUBMUCOSAL LIFTING INJECTION;  Surgeon: Lemar Lofty., MD;  Location: [facility];  Service: Gastroenterology;;    SOCIAL HISTORY: Social History   Socioeconomic History   Marital status: Divorced    Spouse name: Not on file   Number of children: 0   Years of education: Not on file   Highest education level: Not on file  Occupational History   Not on file  Tobacco Use   Smoking status: Former    Current packs/day: 0.00    Types: Cigarettes    Quit date: 22    Years since quitting: 48.6   Smokeless tobacco: Never  Vaping Use   Vaping status: Never Used  Substance and Sexual Activity   Alcohol use: Not Currently    Alcohol/week: 1.0 standard drink of alcohol    Types: 1 Cans of beer per week    Comment: quit 2000   Drug use: Yes    Frequency: 1.0 times per week    Types: Cocaine    Comment: last use Wednesday 06/28/23   Sexual activity: Not on file  Other Topics Concern   Not on file  Social History Narrative   Not on file   Social Determinants of Health   Financial Resource Strain: Low Risk  (05/31/2023)   Overall Financial Resource Strain (CARDIA)    Difficulty of Paying Living Expenses: Not hard at all  Food Insecurity: No Food Insecurity (05/31/2023)   Hunger Vital Sign    Worried About Running Out of Food in the Last Year: Never true    Ran Out of Food in the Last Year: Never true  Transportation Needs: No Transportation Needs (05/31/2023)   PRAPARE - Administrator, Civil Service (Medical): No    Lack of Transportation (Non-Medical): No  Physical Activity: Insufficiently Active (05/31/2023)   Exercise Vital Sign    Days of Exercise per Week: 3 days    Minutes of Exercise per  Session: 30 min  Stress: No Stress Concern Present (05/31/2023)   [employer] of Occupational Health - Occupational Stress Questionnaire    Feeling of Stress : Not at all  Social Connections: Socially Isolated (05/31/2023)   Social Connection and Isolation Panel [NHANES]    Frequency of Communication with Friends and Family: More than three times a week    Frequency of Social Gatherings with Friends and Family: Twice a week    Attends Religious Services: Never    Database administrator or Organizations: No    Attends Banker Meetings: Never    Marital Status: Divorced  Catering manager Violence: Not At Risk (05/31/2023)   Humiliation, Afraid, Rape, and Kick questionnaire    Fear of Current or Ex-Partner: No    Emotionally Abused: No    Physically Abused: No    Sexually Abused: No    FAMILY HISTORY: Family History  Problem Relation Age of Onset  Colon polyps Father    Stomach cancer Father 13   Cancer Sister        Small cell lung cancer   Colon polyps Sister    Cancer Sister        pancreatic cancer   Colon polyps Brother    Colon cancer Neg Hx    Esophageal cancer Neg Hx    Rectal cancer Neg Hx    Inflammatory bowel disease Neg Hx    Liver disease Neg Hx    Pancreatic cancer Neg Hx     ALLERGIES:  has No Known Allergies.  MEDICATIONS:  Current Outpatient Medications  Medication Sig Dispense Refill   albuterol (VENTOLIN HFA) 108 (90 Base) MCG/ACT inhaler Inhale 2 puffs into the lungs every 6 (six) hours as needed for wheezing or shortness of breath. 18 g 2   amLODipine (NORVASC) 10 MG tablet Take 1 tablet (10 mg total) by mouth daily. 90 tablet 2   fluticasone-salmeterol (ADVAIR HFA) 115-21 MCG/ACT inhaler Inhale 2 puffs into the lungs 2 (two) times daily. 12 g 12   Multiple Vitamin (MULTIVITAMIN ADULT PO) Take by mouth.     omeprazole (PRILOSEC) 40 MG capsule Take 1 capsule (40 mg total) by mouth daily before dinner. 60 capsule 3    promethazine-dextromethorphan (PROMETHAZINE-DM) 6.25-15 MG/5ML syrup Take 5 mLs by mouth 4 (four) times daily as needed for cough. 240 mL 1   sildenafil (VIAGRA) 100 MG tablet Take 1 tablet (100 mg total) by mouth daily as needed for erectile dysfunction. 10 tablet 3   No current facility-administered medications for this visit.    REVIEW OF SYSTEMS:   Constitutional: Denies fevers, chills or abnormal night sweats Eyes: Denies blurriness of vision, double vision or watery eyes Ears, nose, mouth, throat, and face: Denies mucositis or sore throat Respiratory: Denies cough, dyspnea or wheezes Cardiovascular: Denies palpitation, chest discomfort or lower extremity swelling Gastrointestinal:  Denies nausea, heartburn or change in bowel habits Skin: Denies abnormal skin rashes Lymphatics: Denies new lymphadenopathy or easy bruising Neurological:Denies numbness, tingling or new weaknesses Behavioral/Psych: Mood is stable, no new changes  All other systems were reviewed with the patient and are negative.  PHYSICAL EXAMINATION: ECOG PERFORMANCE STATUS: 1 - Symptomatic but completely ambulatory  Vitals:   07/13/23 1502  BP: (!) 145/83  Pulse: 77  Resp: 18  Temp: 97.7 F (36.5 C)  SpO2: 97%   Filed Weights   07/13/23 1502  Weight: 173 lb 12.8 oz (78.8 kg)    GENERAL:alert, no distress and comfortable SKIN: skin color, texture, turgor are normal, no rashes or significant lesions EYES: normal, conjunctiva are pink and non-injected, sclera clear OROPHARYNX:no exudate, no erythema and lips, buccal mucosa, and tongue normal  NECK: supple, thyroid normal size, non-tender, without nodularity LYMPH:  no palpable lymphadenopathy in the cervical, axillary or inguinal LUNGS: clear to auscultation and percussion with normal breathing effort HEART: regular rate & rhythm and no murmurs and no lower extremity edema ABDOMEN:abdomen soft, non-tender and normal bowel sounds Musculoskeletal:no  cyanosis of digits and no clubbing  PSYCH: alert & oriented x 3 with fluent speech NEURO: no focal motor/sensory deficits  LABORATORY DATA:  I have reviewed the data as listed    Latest Ref Rng & Units 06/30/2023   10:30 AM 05/16/2023    2:16 PM 05/19/2022   11:07 AM  CBC  WBC 4.0 - 10.5 K/uL 5.9  5.6  4.6   Hemoglobin 13.0 - 17.0 g/dL 16.1  09.6  16.9  Hematocrit 39.0 - 52.0 % 46.8  47.1  48.6   Platelets 150.0 - 400.0 K/uL 271.0  256  240     @cmpl @  RADIOGRAPHIC STUDIES: I have personally reviewed the radiological images as listed and agreed with the findings in the report. 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 * Tracking Code: BO * EXAM: CT CHEST, ABDOMEN, AND PELVIS WITH CONTRAST TECHNIQUE: Multidetector CT imaging of the chest, abdomen and pelvis was performed following the standard protocol during bolus administration of intravenous contrast. RADIATION DOSE REDUCTION: This exam was performed according to the departmental dose-optimization program which includes automated exposure control, adjustment of the mA and/or kV according to patient size and/or use of iterative reconstruction technique. CONTRAST:  80mL OMNIPAQUE IOHEXOL 300 MG/ML  SOLN COMPARISON:  06/12/2023 and 04/03/2020 FINDINGS: CT CHEST FINDINGS Cardiovascular: Coronary, aortic arch, and branch vessel atherosclerotic vascular disease. Mediastinum/Nodes: Small to moderate distal hiatal hernia. Wall thickening in the distal esophagus just above the hiatal hernia, probably correlating with the known malignancy. No paraesophageal adenopathy. Lungs/Pleura: Ground-glass density nodule at the right lung apex measuring 2.2 by 1.2 by 0.6 cm. This was not present 06/12/2023 and is therefore almost certainly benign/inflammatory. Airway thickening is present, suggesting bronchitis or reactive airways disease. Mild airway  plugging in the left lower lobe. Musculoskeletal: Multiple old rib fractures a and old healed left scapular fracture. Lucent mid sternal body lesion with surrounding sclerosis cortical destruction, similar to prior but with little if any gas density. Abnormal surrounding retrosternal and presternal density in this vicinity. Old compression fractures at T1, T2, T3, similar to prior. CT ABDOMEN PELVIS FINDINGS Hepatobiliary: Contracted gallbladder. No focal hepatic parenchymal lesion to suggest hepatic metastatic disease. 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 Spleen: Unremarkable Adrenals/Urinary Tract: Mild scarring in the right kidney. Adrenal glands and urinary bladder unremarkable. Stomach/Bowel: Unremarkable Vascular/Lymphatic: Atherosclerosis is present, including aortoiliac atherosclerotic disease. Infrarenal abdominal aortic aneurysm 4.2 cm in anterior-posterior dimension on image 78 series 2, previously 3.6 cm. Right gastric lymph node 0.8 cm in short axis on image 58 series 2, within normal size limits. No pathologic adenopathy observed. Reproductive: Unremarkable Other: No supplemental non-categorized findings. Musculoskeletal: Old healed left transverse process fractures at L1 through L3. IMPRESSION: 1. Wall thickening in the distal esophagus just above the hiatal hernia, probably correlating with the known malignancy. No paraesophageal adenopathy or findings of distant metastatic disease. 2. Lucent mid sternal body lesion with surrounding sclerosis and cortical destruction, similar to prior but with little if any gas density. Given the prior gas density, infection is certainly favored over a metastatic lesion as this would be an unusual metastatic pathway. Correlate with signs and symptoms of infection in determining need for biopsy. 3. Ground-glass density nodule at the right lung apex measuring 2.2 by 1.2 by 0.6 cm. This was not present 06/12/2023 and is therefore almost  certainly benign/inflammatory. 4. Airway thickening is present, suggesting bronchitis or reactive airways disease. Mild airway plugging in the left lower lobe. 5. Infrarenal abdominal aortic aneurysm 4.2 cm in anterior-posterior dimension, previously 3.6 cm. Recommend follow-up every 12 months and vascular consultation. This recommendation follows ACR consensus guidelines: White Paper of the ACR Incidental Findings Committee II on Vascular Findings. J Am Coll Radiol 2013; 10:789-794. 6. Aortic atherosclerosis. Aortic Atherosclerosis (ICD10-I70.0). Electronically Signed   By: Gaylyn Rong M.D.   On: 07/12/2023 17:47    ASSESSMENT & PLAN:  67 y.o.  male with past medical history of COPD, stage I colon cancer, hypertension, cocaine abuse, is here because of newly diagnosed esophageal cancer.  Distal esophageal carcinoma, moderate to poorly differentiated -Incidental finding on his CT chest for lung cancer screening, he does not have significant dysphagia or odynophagia, does have 10 pound weight loss and a mild fatigue.   -I reviewed his CT scan images, including CT from July and yesterday, with patient and his family in detail -I reviewed his endoscopy findings and biopsy results -No definitive evidence of nodal or distant metastasis, I recommend PET scan for staging, she is more sensitive. -If no nodal metastasis on PET, I recommend EUS for staging -If he has T2 or above, or positive lymph nodes, I would recommend neoadjuvant chemotherapy with FLOT, or concurrent chemoradiation, followed by esophagectomy, if he is a candidate. -Based on the recent ESOPEC data, perioperative chemotherapy with FLOT improves overall survival comparing to neoadjuvant concurrent chemoradiation with cross protocol.  So our likely recommend neoadjuvant FLOT regimen if he has locally advanced disease. -Will refer him to thoracic surgeon [place] for surgical evaluation.  2.  COPD, hypertension, history of heavy smoking,  active cocaine abuse -He has quit smoking completely -I recommend him to quit cocaine completely, he agrees -Follow-up with his PCP and pulmonologist Dr. Delford Field   Plan -PET scan in the next 1 to 2 weeks -Referral to cardiothoracic surgeon Dr. Cliffton Asters -Genetic referral -Dietitian and social worker referral -Will message Dr. Meridee Score for EUS staging -Follow-up after the above workup.    Orders Placed This Encounter  Procedures   NM PET Image Initial (PI) Skull Base To Thigh    Standing Status:   Future    Standing Expiration Date:   07/12/2024    Order Specific Question:   If indicated for the ordered procedure, I authorize the administration of a radiopharmaceutical per Radiology protocol    Answer:   Yes    Order Specific Question:   Preferred imaging location?    Answer:   [facility]   Ambulatory referral to [facility]    Referral Priority:   Routine    Referral Type:   Consultation    Referral Reason:   Specialty Services Required    Number of Visits Requested:   1   Ambulatory referral to Cardiothoracic Surgery    Referral Priority:   Routine    Referral Type:   Surgical    Referral Reason:   Specialty Services Required    Requested Specialty:   Cardiothoracic Surgery    Number of Visits Requested:   1    All questions were answered. The patient knows to call the clinic with any problems, questions or concerns. I spent 55 minutes counseling the patient face to face. The total time spent in the appointment was 60 minutes and more than 50% was on counseling.     Malachy Mood, MD 07/13/2023 5:04 PM

## 2023-07-14 ENCOUNTER — Other Ambulatory Visit: Payer: Self-pay

## 2023-07-14 ENCOUNTER — Inpatient Hospital Stay: Payer: 59 | Admitting: Licensed Clinical Social Worker

## 2023-07-14 ENCOUNTER — Telehealth: Payer: Self-pay

## 2023-07-14 DIAGNOSIS — C159 Malignant neoplasm of esophagus, unspecified: Secondary | ICD-10-CM

## 2023-07-14 DIAGNOSIS — C155 Malignant neoplasm of lower third of esophagus: Secondary | ICD-10-CM

## 2023-07-14 NOTE — Telephone Encounter (Signed)
-----   Message from [facility] sent at 07/14/2023  4:24 AM EDT ----- Jordan [name], Please schedule this patient for 9/12 upper EUS at 9 AM. Would be ideal to have his PET/CT completed before then with the read if possible. Thanks. GM ----- Message ----- From: Malachy Mood, MD Sent: 07/13/2023   5:34 PM EDT To: Chrystine Oiler, MD; Rinaldo Cloud; #  Dr. Cliffton Asters,  I am referring this pt with distal esophageal cancer to you, CT is negative for node and distant mets.  Peggy, please schedule his PET asap.   Gabe, could you do his EUS staging? You removed his every early stage colon cancer in 2021.  Darl Pikes, please review my note and call or meet him. He will apply for financial assistance if he goes on chemotherapy.  Clydie Braun, please schedule him to see me back after PET and EUS.  Thanks   Terrace Arabia

## 2023-07-14 NOTE — Telephone Encounter (Signed)
EUS has been set up for 07/27/23 at 10 am at [facility] with GM   EUS scheduled, pt instructed and medications reviewed.  Patient instructions mailed to home.  Patient to call with any questions or concerns.

## 2023-07-14 NOTE — Telephone Encounter (Signed)
Marily Lente, RN  Mansouraty, Netty Starring., MD; Malachy Mood, MD; Corliss Skains, MD; Rachel Moulds, LCSW; Charlann Lange; 3 others Pet scan scheduled for 9/9 at 1230.  F/u with Dr Mosetta Putt is on 9/16 with labs  KB

## 2023-07-14 NOTE — Progress Notes (Signed)
[facility] Clinical Social Work  Initial Assessment   Jordan [name] is a 67 y.o. year old male contacted by phone. Clinical Social Work was referred by medical provider for assessment of psychosocial needs.   SDOH (Social Determinants of Health) assessments performed: Yes SDOH Interventions    Flowsheet Row Clinical Support from 05/31/2023 in [facility]  SDOH Interventions   Food Insecurity Interventions Intervention Not Indicated  Housing Interventions Intervention Not Indicated  Transportation Interventions Intervention Not Indicated  Utilities Interventions Intervention Not Indicated  Alcohol Usage Interventions Intervention Not Indicated (Score <7)  Financial Strain Interventions Intervention Not Indicated  Physical Activity Interventions Intervention Not Indicated  Stress Interventions Intervention Not Indicated  Social Connections Interventions Intervention Not Indicated  Health Literacy Interventions Intervention Not Indicated       SDOH Screenings   Food Insecurity: No Food Insecurity (05/31/2023)  Housing: Low Risk  (05/31/2023)  Transportation Needs: No Transportation Needs (05/31/2023)  Utilities: Not At Risk (05/31/2023)  Alcohol Screen: Low Risk  (05/31/2023)  Depression (PHQ2-9): Low Risk  (07/04/2023)  Financial Resource Strain: Low Risk  (05/31/2023)  Physical Activity: Insufficiently Active (05/31/2023)  Social Connections: Socially Isolated (05/31/2023)  Stress: No Stress Concern Present (05/31/2023)  Tobacco Use: Medium Risk (07/13/2023)  Health Literacy: Adequate Health Literacy (05/31/2023)     Distress Screen completed: No     No data to display            Family/Social Information:  Housing Arrangement: patient lives with his 59 y/o mother whom he provides assistance to.  Pt's mother reports she is independent in ADLs and continues to drive.   Family members/support persons in your life? Pt's mother is his primary source  of support.  Pt has a brother who resides locally, but travels a great deal for his job and another brother in [place].  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  Income source: Actor concerns:  pt doesn't have any immediate concerns, but if he is unable to make a supplemental income he may have concerns through treatment Type of concern: None Food access concerns: no Religious or spiritual practice: Not known Services Currently in place:  none  Coping/ Adjustment to diagnosis: Patient understands treatment plan and what happens next? Pt is still undergoing diagnostics, treatment not yet determined Concerns about diagnosis and/or treatment: Overwhelmed by information Patient reported stressors: Adjusting to my illness Hopes and/or priorities: Pt's priority is to complete diagnostics and start treatment w/ the hope of positive results Patient enjoys  not addressed Current coping skills/ strengths: Capable of independent living , Motivation for treatment/growth , and Physical Health     SUMMARY: Current SDOH Barriers:  Substance abuse issues -  pt currently using cocaine  Clinical Social Work Clinical Goal(s):  No clinical social work goals at this time  Interventions: Discussed common feeling and emotions when being diagnosed with cancer, and the importance of support during treatment Informed patient of the support team roles and support services at [facility] Provided CSW contact information and encouraged patient to call with any questions or concerns Provided patient with information about the [employer].  CSW to reach out to pt once treatment plan has been determined.   Follow Up Plan: Patient will contact CSW with any support or resource needs Patient verbalizes understanding of plan: Yes    Rachel Moulds, LCSW Clinical Social Worker [facility]

## 2023-07-14 NOTE — Progress Notes (Signed)
I spoke with [employer], Jordan [name] mother.  Jordan [name] was not home at this time.  She will give him my message and have him call me.  She has my direct number.

## 2023-07-18 NOTE — Progress Notes (Signed)
I spoke with Mr Jordan [name] and reviewed the date time location and instructions for his Pet scan on 9/9.  I also reviewed his upcoming appts for EUS and Consult with Dr Cliffton Asters.  All questions were answered.  He verbalized understanding.

## 2023-07-20 ENCOUNTER — Encounter (HOSPITAL_COMMUNITY): Payer: Self-pay | Admitting: Gastroenterology

## 2023-07-21 NOTE — Progress Notes (Signed)
Attempted to obtain medical history via telephone, unable to reach at this time. HIPAA compliant voicemail message left requesting return call to pre surgical testing department. 

## 2023-07-24 ENCOUNTER — Encounter (HOSPITAL_COMMUNITY)
Admission: RE | Admit: 2023-07-24 | Discharge: 2023-07-24 | Disposition: A | Discharge status: Home / Self Care | Payer: 59 | Source: Ambulatory Visit | Attending: Hematology | Admitting: Hematology

## 2023-07-24 ENCOUNTER — Inpatient Hospital Stay: Payer: 59 | Attending: Hematology

## 2023-07-24 DIAGNOSIS — J449 Chronic obstructive pulmonary disease, unspecified: Secondary | ICD-10-CM | POA: Insufficient documentation

## 2023-07-24 DIAGNOSIS — C155 Malignant neoplasm of lower third of esophagus: Secondary | ICD-10-CM | POA: Insufficient documentation

## 2023-07-24 DIAGNOSIS — Z79899 Other long term (current) drug therapy: Secondary | ICD-10-CM | POA: Insufficient documentation

## 2023-07-24 DIAGNOSIS — Z9221 Personal history of antineoplastic chemotherapy: Secondary | ICD-10-CM | POA: Insufficient documentation

## 2023-07-24 DIAGNOSIS — B1920 Unspecified viral hepatitis C without hepatic coma: Secondary | ICD-10-CM | POA: Insufficient documentation

## 2023-07-24 DIAGNOSIS — I10 Essential (primary) hypertension: Secondary | ICD-10-CM | POA: Insufficient documentation

## 2023-07-24 DIAGNOSIS — Z7951 Long term (current) use of inhaled steroids: Secondary | ICD-10-CM | POA: Insufficient documentation

## 2023-07-24 DIAGNOSIS — J4489 Other specified chronic obstructive pulmonary disease: Secondary | ICD-10-CM | POA: Insufficient documentation

## 2023-07-24 DIAGNOSIS — K219 Gastro-esophageal reflux disease without esophagitis: Secondary | ICD-10-CM | POA: Insufficient documentation

## 2023-07-24 DIAGNOSIS — K449 Diaphragmatic hernia without obstruction or gangrene: Secondary | ICD-10-CM | POA: Insufficient documentation

## 2023-07-24 DIAGNOSIS — B182 Chronic viral hepatitis C: Secondary | ICD-10-CM | POA: Insufficient documentation

## 2023-07-24 DIAGNOSIS — Z87891 Personal history of nicotine dependence: Secondary | ICD-10-CM | POA: Insufficient documentation

## 2023-07-24 DIAGNOSIS — Z923 Personal history of irradiation: Secondary | ICD-10-CM | POA: Insufficient documentation

## 2023-07-24 LAB — GLUCOSE, CAPILLARY: Glucose-Capillary: 105 mg/dL — ABNORMAL HIGH (ref 70–99)

## 2023-07-24 MED ORDER — FLUDEOXYGLUCOSE F - 18 (FDG) INJECTION
8.6200 | Freq: Once | INTRAVENOUS | Status: AC
  Administered 2023-07-24: 8.62 via INTRAVENOUS

## 2023-07-24 NOTE — Progress Notes (Signed)
Nutrition  Patient did not show up for scheduled nutrition visit. Message sent to scheduling to offer another appointment.  Message also sent to referring provider  Liel Rudden B. Freida Busman, RD, LDN Registered Dietitian [phone]

## 2023-07-25 ENCOUNTER — Ambulatory Visit: Payer: 59 | Attending: Family Medicine

## 2023-07-25 DIAGNOSIS — Z0130 Encounter for examination of blood pressure without abnormal findings: Secondary | ICD-10-CM | POA: Diagnosis not present

## 2023-07-25 NOTE — Progress Notes (Signed)
I spoke with Jordan [name].  I relayed the PET scan results that should now distant metastatic disease.  I reminded him of his appt for EUS on 9/12 and his appt with dr Cliffton Asters on 9/13.  All questions were answered.  He verbalized understanding.

## 2023-07-25 NOTE — Progress Notes (Signed)
   Blood Pressure Recheck Visit  Name: Jordan [name] MRN: [id number] Date of Birth: 1956-05-05  HARVY MAGNANO presents today for Blood Pressure recheck with clinical support staff.  Order for BP recheck by Bunnie Philips, ordered on 07/25/2023.   BP Readings from Last 3 Encounters:  07/25/23 (!) 151/73  07/13/23 (!) 145/83  07/04/23 121/71    Current Outpatient Medications  Medication Sig Dispense Refill   albuterol (VENTOLIN HFA) 108 (90 Base) MCG/ACT inhaler Inhale 2 puffs into the lungs every 6 (six) hours as needed for wheezing or shortness of breath. 18 g 2   amLODipine (NORVASC) 10 MG tablet Take 1 tablet (10 mg total) by mouth daily. 90 tablet 2   fluticasone-salmeterol (ADVAIR HFA) 115-21 MCG/ACT inhaler Inhale 2 puffs into the lungs 2 (two) times daily. 12 g 12   Multiple Vitamin (MULTIVITAMIN ADULT PO) Take by mouth.     omeprazole (PRILOSEC) 40 MG capsule Take 1 capsule (40 mg total) by mouth daily before dinner. 60 capsule 3   promethazine-dextromethorphan (PROMETHAZINE-DM) 6.25-15 MG/5ML syrup Take 5 mLs by mouth 4 (four) times daily as needed for cough. 240 mL 1   sildenafil (VIAGRA) 100 MG tablet Take 1 tablet (100 mg total) by mouth daily as needed for erectile dysfunction. 10 tablet 3   No current facility-administered medications for this visit.    Hypertensive Medication Review: Patient states that they are taking all their hypertensive medications as prescribed and their last dose of hypertensive medications was this morning   Documentation of any medication adherence discrepancies: Patient reports that he missed Blood pressure medication on 07/23/2023, 07/24/2023. Reports that he did take med this morning. Patient reports that he has not been monitoring  Blood pressures as he should.  Provider Recommendation:  Spoke to Dr. Delford Field and they stated: Patient should go back to taking amLODipine (NORVASC) 10 MG tablet daily., Monitor BP daily, Reduce sodium  intake.  Patient has been scheduled to follow up with follow-up with nurse in 2 weeks   Patient has been given provider's recommendations and does not have any questions or concerns at this time. Patient will contact the office for any future questions or concerns.

## 2023-07-26 ENCOUNTER — Other Ambulatory Visit: Payer: Self-pay | Admitting: Hematology

## 2023-07-26 ENCOUNTER — Other Ambulatory Visit: Payer: Self-pay

## 2023-07-26 DIAGNOSIS — C155 Malignant neoplasm of lower third of esophagus: Secondary | ICD-10-CM

## 2023-07-26 NOTE — Progress Notes (Addendum)
I spoke to Mr Spradley.  He is aware and agreeable to bone biopsy of the sternal lesion.  He knows that IR will call this afternoon to review date time location and instructions of biopsy appt.  All questions were answered.  He verbalized understanding. I mailed instructions for the biopsy to Mr Diener' home.

## 2023-07-26 NOTE — Progress Notes (Signed)
Oley Balm, MD  Leodis Rains D PROCEDURE / BIOPSY REVIEW Date: 07/26/23  Requested Biopsy site: sternum  Reason for request: infection vs met Imaging review: Best seen on PET 07/24/23 and priors  Decision: Approved Imaging modality to perform: Ultrasound or  CT ask scheduled operator Schedule with: Moderate Sedation Schedule for: Any VIR  Additional comments:   Please contact me with questions, concerns, or if issue pertaining to this request arise.  Dayne Oley Balm, MD [facility]

## 2023-07-26 NOTE — Progress Notes (Signed)
The proposed treatment discussed in conference is for discussion purpose only and is not a binding recommendation.  The patients have not been physically examined, or presented with their treatment options.  Therefore, final treatment plans cannot be decided.  

## 2023-07-27 ENCOUNTER — Ambulatory Visit (HOSPITAL_COMMUNITY): Payer: 59 | Admitting: Anesthesiology

## 2023-07-27 ENCOUNTER — Other Ambulatory Visit: Payer: Self-pay

## 2023-07-27 ENCOUNTER — Encounter (HOSPITAL_COMMUNITY): Payer: Self-pay | Admitting: Gastroenterology

## 2023-07-27 ENCOUNTER — Encounter (HOSPITAL_COMMUNITY)
Admission: RE | Disposition: A | Discharge status: Home / Self Care | Payer: Self-pay | Source: Home / Self Care | Attending: Gastroenterology

## 2023-07-27 ENCOUNTER — Ambulatory Visit (HOSPITAL_COMMUNITY)
Admission: RE | Admit: 2023-07-27 | Discharge: 2023-07-27 | Disposition: A | Discharge status: Home / Self Care | Payer: 59 | Attending: Gastroenterology | Admitting: Gastroenterology

## 2023-07-27 DIAGNOSIS — K219 Gastro-esophageal reflux disease without esophagitis: Secondary | ICD-10-CM | POA: Diagnosis not present

## 2023-07-27 DIAGNOSIS — I739 Peripheral vascular disease, unspecified: Secondary | ICD-10-CM | POA: Diagnosis not present

## 2023-07-27 DIAGNOSIS — C159 Malignant neoplasm of esophagus, unspecified: Secondary | ICD-10-CM

## 2023-07-27 DIAGNOSIS — I899 Noninfective disorder of lymphatic vessels and lymph nodes, unspecified: Secondary | ICD-10-CM | POA: Diagnosis not present

## 2023-07-27 DIAGNOSIS — I129 Hypertensive chronic kidney disease with stage 1 through stage 4 chronic kidney disease, or unspecified chronic kidney disease: Secondary | ICD-10-CM | POA: Diagnosis not present

## 2023-07-27 DIAGNOSIS — Z85038 Personal history of other malignant neoplasm of large intestine: Secondary | ICD-10-CM | POA: Diagnosis not present

## 2023-07-27 DIAGNOSIS — K449 Diaphragmatic hernia without obstruction or gangrene: Secondary | ICD-10-CM | POA: Insufficient documentation

## 2023-07-27 DIAGNOSIS — N1832 Chronic kidney disease, stage 3b: Secondary | ICD-10-CM | POA: Diagnosis not present

## 2023-07-27 DIAGNOSIS — Z87891 Personal history of nicotine dependence: Secondary | ICD-10-CM | POA: Insufficient documentation

## 2023-07-27 DIAGNOSIS — Z7951 Long term (current) use of inhaled steroids: Secondary | ICD-10-CM | POA: Insufficient documentation

## 2023-07-27 DIAGNOSIS — K2289 Other specified disease of esophagus: Secondary | ICD-10-CM | POA: Insufficient documentation

## 2023-07-27 DIAGNOSIS — C155 Malignant neoplasm of lower third of esophagus: Secondary | ICD-10-CM | POA: Diagnosis not present

## 2023-07-27 DIAGNOSIS — I10 Essential (primary) hypertension: Secondary | ICD-10-CM

## 2023-07-27 DIAGNOSIS — K2281 Esophageal polyp: Secondary | ICD-10-CM | POA: Insufficient documentation

## 2023-07-27 DIAGNOSIS — Z801 Family history of malignant neoplasm of trachea, bronchus and lung: Secondary | ICD-10-CM | POA: Insufficient documentation

## 2023-07-27 DIAGNOSIS — J449 Chronic obstructive pulmonary disease, unspecified: Secondary | ICD-10-CM | POA: Diagnosis not present

## 2023-07-27 HISTORY — PX: ESOPHAGOGASTRODUODENOSCOPY (EGD) WITH PROPOFOL: SHX5813

## 2023-07-27 HISTORY — PX: EUS: SHX5427

## 2023-07-27 SURGERY — UPPER ENDOSCOPIC ULTRASOUND (EUS) RADIAL
Anesthesia: Monitor Anesthesia Care

## 2023-07-27 MED ORDER — LACTATED RINGERS IV SOLN: INTRAVENOUS | Status: DC

## 2023-07-27 MED ORDER — SODIUM CHLORIDE 0.9 % IV SOLN: INTRAVENOUS | Status: DC

## 2023-07-27 MED ORDER — PHENYLEPHRINE HCL (PRESSORS) 10 MG/ML IV SOLN
INTRAVENOUS | Status: DC | PRN
  Administered 2023-07-27 (×2): 160 ug via INTRAVENOUS

## 2023-07-27 MED ORDER — PROPOFOL 500 MG/50ML IV EMUL
INTRAVENOUS | Status: DC | PRN
  Administered 2023-07-27: 150 ug/kg/min via INTRAVENOUS

## 2023-07-27 NOTE — Progress Notes (Signed)
[address]       [place] [address]             [phone]                    Jordan [name] [facility] Medical Record #[id number] Date of Birth: [birth year]  Referring: Malachy Mood, MD Primary Care: Storm Frisk, MD Primary Cardiologist: None  Chief Complaint:    Chief Complaint  Patient presents with   Esophageal Cancer    New patient consultation, review all studies    History of Present Illness:    Jordan [name] 67 y.o. 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  This was found incidentally when he underwent a CT chest for sternal pain.  Thickening was noted in the distal esophagus, thus he underwent an EGD which identified the lesions.  PET/CT then showed avidity in the distal esophagus, and the sternum.    He denies any dysphagia, or odynophagia.  He does have a hiatal hernia and describes a long history of reflux.  He also has an active history of cocaine abuse.       Past Medical History:  Diagnosis Date   Allergy    seasonal   Aneurysm of infrarenal abdominal aorta (HCC)    3.4 cm per angela mcclung pa 04-16-2020 office note   Arthritis    Colon cancer (HCC)    Compression fracture of thoracic vertebra with routine healing 04/30/2020   COPD (chronic obstructive pulmonary disease) (HCC)    Family history of adverse reaction to anesthesia    mother slow to awaken   GERD (gastroesophageal reflux disease)    Hepatitis    hepatitis c dx in remission per pt   Hypertension    Inguinal hernia    Multiple fractures 06/04/2020   Positive colorectal cancer screening using Cologuard test 08/27/2020   Substance abuse (HCC)    Wears glasses    for reading    Past Surgical History:  Procedure Laterality Date   COLONOSCOPY  11/11/2020   ENDOSCOPIC MUCOSAL RESECTION N/A 03/10/2021   Procedure: ENDOSCOPIC MUCOSAL RESECTION;  Surgeon: Lemar Lofty., MD;  Location: [facility]  END[facility]COPY;  Service: Gastroenterology;  Laterality: N/A;   FLEXIBLE SIGMOIDOSCOPY N/A 03/10/2021   Procedure: FLEXIBLE SIGMOIDOSCOPY;  Surgeon: Meridee Score Netty Starring., MD;  Location: [facility] ENDOSCOPY;  Service: Gastroenterology;  Laterality: N/A;   HEMOSTASIS CLIP PLACEMENT  03/10/2021   Procedure: HEMOSTASIS CLIP PLACEMENT;  Surgeon: Lemar Lofty., MD;  Location: [facility] ENDOSCOPY;  Service: Gastroenterology;;   INGUINAL HERNIA REPAIR Right 01/19/2021   Procedure: LAPAROSCOPIC RIGHT INGUINAL HERNIA REPAIR WITH MESH;  Surgeon: Stechschulte, Hyman Hopes, MD;  Location: [facility];  Service: General;  Laterality: Right;   obstruction removed as a child   swallowed a peanut   POLYPECTOMY  03/10/2021   Procedure: POLYPECTOMY;  Surgeon: Mansouraty, Netty Starring., MD;  Location: [facility];  Service: Gastroenterology;;   POLYPECTOMY     SUBMUCOSAL LIFTING INJECTION  03/10/2021   Procedure: SUBMUCOSAL LIFTING INJECTION;  Surgeon: Lemar Lofty., MD;  Location: [facility];  Service: Gastroenterology;;    Family History  Problem Relation Age of Onset   Colon polyps Father    Stomach cancer Father 60   Cancer Sister        Small cell lung cancer   Colon polyps Sister    Cancer Sister  pancreatic cancer   Colon polyps Brother    Colon cancer Neg Hx    Esophageal cancer Neg Hx    Rectal cancer Neg Hx    Inflammatory bowel disease Neg Hx    Liver disease Neg Hx    Pancreatic cancer Neg Hx      Social History   Tobacco Use  Smoking Status Former   Types: Cigarettes   Start date: 2022   Quit date: 1975   Years since quitting: 49.7  Smokeless Tobacco Never    Social History   Substance and Sexual Activity  Alcohol Use Not Currently   Alcohol/week: 1.0 standard drink of alcohol   Types: 1 Cans of beer per week   Comment: quit 2000     No Known Allergies  Current Outpatient Medications  Medication Sig Dispense Refill   albuterol (VENTOLIN HFA) 108  (90 Base) MCG/ACT inhaler Inhale 2 puffs into the lungs every 6 (six) hours as needed for wheezing or shortness of breath. 18 g 2   amLODipine (NORVASC) 10 MG tablet Take 1 tablet (10 mg total) by mouth daily. 90 tablet 2   fluticasone-salmeterol (ADVAIR HFA) 115-21 MCG/ACT inhaler Inhale 2 puffs into the lungs 2 (two) times daily. 12 g 12   Multiple Vitamin (MULTIVITAMIN ADULT PO) Take by mouth.     omeprazole (PRILOSEC) 40 MG capsule Take 1 capsule (40 mg total) by mouth daily before dinner. 60 capsule 3   promethazine-dextromethorphan (PROMETHAZINE-DM) 6.25-15 MG/5ML syrup Take 5 mLs by mouth 4 (four) times daily as needed for cough. 240 mL 1   sildenafil (VIAGRA) 100 MG tablet Take 1 tablet (100 mg total) by mouth daily as needed for erectile dysfunction. 10 tablet 3   No current facility-administered medications for this visit.    Review of Systems  Constitutional:  Positive for weight loss. Negative for malaise/fatigue.  Respiratory:  Positive for shortness of breath.   Cardiovascular:  Positive for chest pain.  Gastrointestinal:  Positive for heartburn.  Musculoskeletal:  Positive for myalgias.  Neurological: Negative.      PHYSICAL EXAMINATION: BP 138/77 (BP Location: Left Arm, Patient Position: Sitting, Cuff Size: Normal)   Pulse 84   Resp 20   Ht 5\' 11"  (1.803 m)   Wt 174 lb 3.2 oz (79 kg)   SpO2 94% Comment: RA  BMI 24.30 kg/m  Physical Exam Constitutional:      General: He is not in acute distress. HENT:     Head: Normocephalic and atraumatic.  Eyes:     Extraocular Movements: Extraocular movements intact.  Cardiovascular:     Rate and Rhythm: Normal rate.  Pulmonary:     Effort: Pulmonary effort is normal. No respiratory distress.  Abdominal:     General: Abdomen is flat. There is no distension.  Musculoskeletal:        General: Normal range of motion.     Cervical back: Normal range of motion.  Skin:    General: Skin is warm and dry.  Neurological:      General: No focal deficit present.     Mental Status: He is alert and oriented to person, place, and time.     Diagnostic Studies & Laboratory data:     PET/CT: IMPRESSION: Distal esophageal/GE junction mass, corresponding to the patient's known primary esophageal cancer.   No evidence of metastatic disease.   Sclerosis/destructive changes involving the lower sternum, favoring infection/osteomyelitis.   4.2 cm infrarenal abdominal aortic aneurysm. Recommend follow-up every 12 months and  vascular consultation.    EGD/EUS:  - T2N0Mx 35-39cm - T1smN0Mx 34-35cm  Path:  1. Esophagus, biopsy, distal - INVASIVE MODERATE TO POORLY DIFFERENTIATED CARCINOMA, SEE NOTE 2. Esophagus, biopsy, proximal - INVASIVE MODERATE TO POORLY DIFFERENTIATED CARCINOMA, SEE NOTE      I have independently reviewed the above radiology studies  and reviewed the findings with the patient.   Recent Lab Findings: Lab Results  Component Value Date   WBC 5.9 06/30/2023   HGB 15.4 06/30/2023   HCT 46.8 06/30/2023   PLT 271.0 06/30/2023   GLUCOSE 102 (H) 06/30/2023   CHOL 164 01/10/2022   TRIG 250 (H) 01/10/2022   HDL 38 (L) 01/10/2022   LDLCALC 84 01/10/2022   ALT 12 06/30/2023   AST 14 06/30/2023   NA 132 (L) 06/30/2023   K 4.1 06/30/2023   CL 99 06/30/2023   CREATININE 1.60 (H) 07/12/2023   BUN 31 (H) 06/30/2023   CO2 25 06/30/2023   INR 1.0 05/19/2022      Problem List: 2 esophageal lesions ranging from 34-39cm Hx of cocaine abuse  Assessment / Plan:   67yo male with 2 distal esophageal lesions.  Most invasive is T2N0Mx. He is scheduled for biopsy of the area of the sternum with SUV uptake.  If negative, then we will plan for surgery after he has completed his neo-adjuvant therapy.  We discussed the importance of his nutrition during this period.  I will follow-up with him in 1 month.      I  spent 40 minutes with the patient face to face counseling and coordination of care.     Corliss Skains 07/30/2023 10:27 AM

## 2023-07-27 NOTE — Discharge Instructions (Signed)

## 2023-07-27 NOTE — Transfer of Care (Signed)
Immediate Anesthesia Transfer of Care Note  Patient: Jordan [name]  Procedure(s) Performed: UPPER ENDOSCOPIC ULTRASOUND (EUS) RADIAL  Patient Location: PACU  Anesthesia Type:MAC  Level of Consciousness: sedated, patient cooperative, and responds to stimulation  Airway & Oxygen Therapy: Patient Spontanous Breathing and Patient connected to face mask oxygen  Post-op Assessment: Report given to RN and Post -op Vital signs reviewed and stable  Post vital signs: Reviewed and stable  Last Vitals:  Vitals Value Taken Time  BP    Temp    Pulse    Resp    SpO2      Last Pain:  Vitals:   07/27/23 0930  TempSrc: Temporal  PainSc: 0-No pain         Complications: No notable events documented.

## 2023-07-27 NOTE — Op Note (Signed)
[facility] Patient Name: Jordan [name] Procedure Date: 07/27/2023 MRN: [id number] Attending MD: Corliss Parish , MD, [id number] Date of Birth: 1956-01-07 CSN: [id number] Age: 67 Admit Type: Outpatient Procedure:                Upper EUS Indications:              Esophageal mucosal mass/polyp found on endoscopy,                            Pre-treatment staging of esophageal adenocarcinoma Providers:                Corliss Parish, MD, Margaree Mackintosh, RN,                            Salley Scarlet, Technician, Mirian Mo, CRNA Referring MD:             Iva Boop, MD, Malachy Mood, Corliss Skains Medicines:                Monitored Anesthesia Care Complications:            No immediate complications. Estimated Blood Loss:     Estimated blood loss was minimal. Procedure:                Pre-Anesthesia Assessment:                           - Prior to the procedure, a History and Physical                            was performed, and patient medications and                            allergies were reviewed. The patient's tolerance of                            previous anesthesia was also reviewed. The risks                            and benefits of the procedure and the sedation                            options and risks were discussed with the patient.                            All questions were answered, and informed consent                            was obtained. Prior Anticoagulants: The patient has                            taken no anticoagulant or antiplatelet agents. ASA                            Grade Assessment: III - A patient with severe  systemic disease. After reviewing the risks and                            benefits, the patient was deemed in satisfactory                            condition to undergo the procedure.                           After obtaining informed consent, the endoscope was                             passed under direct vision. Throughout the                            procedure, the patient's blood pressure, pulse, and                            oxygen saturations were monitored continuously. The                            [id number]-[id number] ([id number]) Olympus endoscope was introduced                            through the mouth, and advanced to the second part                            of duodenum. The [id number] ([id number]) Olympus                            radial ultrasound scope was introduced through the                            mouth, and advanced to the stomach for ultrasound                            examination from the esophagus and stomach. The                            upper EUS was accomplished without difficulty. The                            patient tolerated the procedure. Scope In: Scope Out: Findings:      ENDOSCOPIC FINDING: :      No gross lesions were noted in the proximal esophagus and in the mid       esophagus.      A small, fungating, submucosal and ulcerating mass with no bleeding and       stigmata of recent bleeding was found in the distal esophagus, 34 to 35       cm from the incisors. The mass was non-obstructing and not       circumferential.      A large, fungating and ulcerating mass with no bleeding and no stigmata       of recent bleeding was  found in the distal esophagus, 35 to 39 cm from       the incisors. The mass was non-obstructing and partially circumferential       (involving one-half of the lumen circumference).      The Z-line was irregular and was found 41 cm from the incisors.      A 2 cm hiatal hernia was present. No evidence of the mass involving this       area at the time.      No gross lesions were noted in the entire examined stomach.      No gross lesions were noted in the duodenal bulb, in the first portion       of the duodenum and in the second portion of the duodenum.      ENDOSONOGRAPHIC FINDING: :       A hypoechoic mass was found in the lower third of the esophagus. The       mass was encountered at 34 cm from the incisors and extended to 35 cm.       The lesion was non-circumferential. The endosonographic borders were       well-defined. The mass measured up to 15 mm in thickness. There was       sonographic evidence suggesting invasion into the submucosa (Layer 3).      A hypoechoic mass was found in the lower third of the esophagus. The       mass was encountered at 35 cm from the incisors and extended to 39 cm.       The lesion was partially circumferential (involving one-half of the       lumen circumference). The endosonographic borders were well-defined. The       mass measured up to 30 mm in thickness. There was sonographic evidence       suggesting invasion into the muscularis propria (Layer 4) but not       necessarily though the MP.      Endosonographic imaging in the middle third of the esophagus showed no       mass or wall thickening.      No malignant-appearing lymph nodes were visualized in the middle       paraesophageal mediastinum (level 29M), lower paraesophageal mediastinum       (level 8L) and celiac region (level 20).      Endosonographic imaging in the visualized portion of the liver showed no       mass.      The celiac region was visualized. Impression:               EGD Impression:                           - No gross lesions in the proximal esophagus and in                            the mid esophagus.                           - Malignant esophageal tumor was found in the                            distal esophagus.                           -  Malignant esophageal tumor was found in the                            distal esophagus.                           - Z-line irregular, 41 cm from the incisors.                           - 2 cm hiatal hernia. No evidence of the mass                            involving this at this time.                           - No  gross lesions in the entire stomach.                           - No gross lesions in the duodenal bulb, in the                            first portion of the duodenum and in the second                            portion of the duodenum.                           EUS Impression:                           - A mass was found in the lower third of the                            esophagus. A tissue diagnosis was obtained prior to                            this exam. This is of adenocarcinoma. This was                            staged T1sm N0 Mx by endosonographic criteria.                            Found 34-35 cm.                           - A mass was found in the lower third of the                            esophagus. A tissue diagnosis was obtained prior to                            this exam. This is of adenocarcinoma. This was  staged T2 N0 Mx by endosonographic criteria. Found                            35-39 cm.                           - No malignant-appearing lymph nodes were                            visualized in the middle paraesophageal mediastinum                            (level 55M), lower paraesophageal mediastinum (level                            8L) and celiac region (level 20). Moderate Sedation:      Not Applicable - Patient had care per Anesthesia. Recommendation:           - The patient will be observed post-procedure,                            until all discharge criteria are met.                           - Discharge patient to home.                           - Patient has a contact number available for                            emergencies. The signs and symptoms of potential                            delayed complications were discussed with the                            patient. Return to normal activities tomorrow.                            Written discharge instructions were provided to the                             patient.                           - Resume previous diet.                           - Observe patient's clinical course.                           - Followup with Oncology/Radiation                            Oncology/Thoracic Surgery to discuss next steps in  evaluation/treatment. Procedure Code(s):        --- Professional ---                           [phone], Esophagogastroduodenoscopy, flexible,                            transoral; with endoscopic ultrasound examination                            limited to the esophagus, stomach or duodenum, and                            adjacent structures Diagnosis Code(s):        --- Professional ---                           C15.5, Malignant neoplasm of lower third of                            esophagus                           K22.89, Other specified disease of esophagus                           K44.9, Diaphragmatic hernia without obstruction or                            gangrene                           I89.9, Noninfective disorder of lymphatic vessels                            and lymph nodes, unspecified                           K22.81, Esophageal polyp CPT copyright 2022 American Medical Association. All rights reserved. The codes documented in this report are preliminary and upon coder review may  be revised to meet current compliance requirements. Corliss Parish, MD 07/27/2023 11:06:00 AM Number of Addenda: 0

## 2023-07-27 NOTE — Anesthesia Preprocedure Evaluation (Addendum)
Anesthesia Evaluation  Patient identified by MRN, date of birth, ID band Patient awake    Reviewed: Allergy & Precautions, H&P , NPO status , Patient's Chart, lab work & pertinent test results  Airway Mallampati: II  TM Distance: >3 FB Neck ROM: Full    Dental  (+) Poor Dentition, Missing, Loose, Chipped, Dental Advisory Given,    Pulmonary COPD, former smoker   Pulmonary exam normal breath sounds clear to auscultation       Cardiovascular hypertension, + Peripheral Vascular Disease  Normal cardiovascular exam Rhythm:Regular Rate:Normal     Neuro/Psych negative neurological ROS  negative psych ROS   GI/Hepatic Neg liver ROS,GERD  ,,  Endo/Other  negative endocrine ROS    Renal/GU Renal InsufficiencyRenal disease  negative genitourinary   Musculoskeletal negative musculoskeletal ROS (+)    Abdominal   Peds negative pediatric ROS (+)  Hematology negative hematology ROS (+)   Anesthesia Other Findings   Reproductive/Obstetrics negative OB ROS                             Anesthesia Physical Anesthesia Plan  ASA: 3  Anesthesia Plan: MAC   Post-op Pain Management: Minimal or no pain anticipated   Induction: Intravenous  PONV Risk Score and Plan: 1 and Treatment may vary due to age or medical condition and Propofol infusion  Airway Management Planned: Simple Face Mask  Additional Equipment:   Intra-op Plan:   Post-operative Plan:   Informed Consent: I have reviewed the patients History and Physical, chart, labs and discussed the procedure including the risks, benefits and alternatives for the proposed anesthesia with the patient or authorized representative who has indicated his/her understanding and acceptance.     Dental advisory given  Plan Discussed with: CRNA and Surgeon  Anesthesia Plan Comments:        Anesthesia Quick Evaluation

## 2023-07-27 NOTE — Consult Note (Addendum)
GASTROENTEROLOGY PROCEDURE H&P NOTE   Primary Care Physician: Storm Frisk, MD  HPI: Jordan [name] is a 67 y.o. male who presents for EGD/EUS for esophageal cancer staging with negative CT and PET.  Past Medical History:  Diagnosis Date   Allergy    seasonal   Aneurysm of infrarenal abdominal aorta (HCC)    3.4 cm per angela mcclung pa 04-16-2020 office note   Arthritis    Colon cancer (HCC)    Compression fracture of thoracic vertebra with routine healing 04/30/2020   COPD (chronic obstructive pulmonary disease) (HCC)    Family history of adverse reaction to anesthesia    mother slow to awaken   GERD (gastroesophageal reflux disease)    Hepatitis    hepatitis c dx in remission per pt   Hypertension    Inguinal hernia    Multiple fractures 06/04/2020   Positive colorectal cancer screening using Cologuard test 08/27/2020   Substance abuse (HCC)    Wears glasses    for reading   Past Surgical History:  Procedure Laterality Date   COLONOSCOPY  11/11/2020   ENDOSCOPIC MUCOSAL RESECTION N/A 03/10/2021   Procedure: ENDOSCOPIC MUCOSAL RESECTION;  Surgeon: Lemar Lofty., MD;  Location: [facility];  Service: Gastroenterology;  Laterality: N/A;   FLEXIBLE SIGMOIDOSCOPY N/A 03/10/2021   Procedure: FLEXIBLE SIGMOIDOSCOPY;  Surgeon: Meridee Score Netty Starring., MD;  Location: [facility];  Service: Gastroenterology;  Laterality: N/A;   HEMOSTASIS CLIP PLACEMENT  03/10/2021   Procedure: HEMOSTASIS CLIP PLACEMENT;  Surgeon: Lemar Lofty., MD;  Location: [facility];  Service: Gastroenterology;;   INGUINAL HERNIA REPAIR Right 01/19/2021   Procedure: LAPAROSCOPIC RIGHT INGUINAL HERNIA REPAIR WITH MESH;  Surgeon: Stechschulte, Hyman Hopes, MD;  Location: [facility] [place];  Service: General;  Laterality: Right;   obstruction removed as a child   swallowed a peanut   POLYPECTOMY  03/10/2021   Procedure: POLYPECTOMY;  Surgeon: Mansouraty, Netty Starring., MD;   Location: [facility];  Service: Gastroenterology;;   POLYPECTOMY     SUBMUCOSAL LIFTING INJECTION  03/10/2021   Procedure: SUBMUCOSAL LIFTING INJECTION;  Surgeon: Lemar Lofty., MD;  Location: [facility];  Service: Gastroenterology;;   Current Facility-Administered Medications  Medication Dose Route Frequency Provider Last Rate Last Admin   0.9 %  sodium chloride infusion   Intravenous Continuous Mansouraty, Netty Starring., MD       lactated ringers infusion   Intravenous Continuous Mansouraty, Netty Starring., MD 10 mL/hr at 07/27/23 0932 New Bag at 07/27/23 0932    Current Facility-Administered Medications:    0.9 %  sodium chloride infusion, , Intravenous, Continuous, Mansouraty, Netty Starring., MD   lactated ringers infusion, , Intravenous, Continuous, Mansouraty, Netty Starring., MD, Last Rate: 10 mL/hr at 07/27/23 0932, New Bag at 07/27/23 0932 No Known Allergies Family History  Problem Relation Age of Onset   Colon polyps Father    Stomach cancer Father 76   Cancer Sister        Small cell lung cancer   Colon polyps Sister    Cancer Sister        pancreatic cancer   Colon polyps Brother    Colon cancer Neg Hx    Esophageal cancer Neg Hx    Rectal cancer Neg Hx    Inflammatory bowel disease Neg Hx    Liver disease Neg Hx    Pancreatic cancer Neg Hx    Social History   Socioeconomic History   Marital status: Divorced    Spouse  name: Not on file   Number of children: 0   Years of education: Not on file   Highest education level: Not on file  Occupational History   Not on file  Tobacco Use   Smoking status: Former    Current packs/day: 0.00    Types: Cigarettes    Quit date: 19    Years since quitting: 48.7   Smokeless tobacco: Never  Vaping Use   Vaping status: Never Used  Substance and Sexual Activity   Alcohol use: Not Currently    Alcohol/week: 1.0 standard drink of alcohol    Types: 1 Cans of beer per week    Comment: quit 2000   Drug use: Yes     Frequency: 1.0 times per week    Types: Cocaine    Comment: last use Wednesday 06/28/23   Sexual activity: Not on file  Other Topics Concern   Not on file  Social History Narrative   Not on file   Social Determinants of Health   Financial Resource Strain: Low Risk  (05/31/2023)   Overall Financial Resource Strain (CARDIA)    Difficulty of Paying Living Expenses: Not hard at all  Food Insecurity: No Food Insecurity (05/31/2023)   Hunger Vital Sign    Worried About Running Out of Food in the Last Year: Never true    Ran Out of Food in the Last Year: Never true  Transportation Needs: No Transportation Needs (05/31/2023)   PRAPARE - Administrator, Civil Service (Medical): No    Lack of Transportation (Non-Medical): No  Physical Activity: Insufficiently Active (05/31/2023)   Exercise Vital Sign    Days of Exercise per Week: 3 days    Minutes of Exercise per Session: 30 min  Stress: No Stress Concern Present (05/31/2023)   [employer] of Occupational Health - Occupational Stress Questionnaire    Feeling of Stress : Not at all  Social Connections: Socially Isolated (05/31/2023)   Social Connection and Isolation Panel [NHANES]    Frequency of Communication with Friends and Family: More than three times a week    Frequency of Social Gatherings with Friends and Family: Twice a week    Attends Religious Services: Never    Database administrator or Organizations: No    Attends Banker Meetings: Never    Marital Status: Divorced  Catering manager Violence: Not At Risk (05/31/2023)   Humiliation, Afraid, Rape, and Kick questionnaire    Fear of Current or Ex-Partner: No    Emotionally Abused: No    Physically Abused: No    Sexually Abused: No    Physical Exam: Today's Vitals   07/27/23 0930  BP: (!) 160/73  Pulse: 68  Resp: 17  Temp: 98 F (36.7 C)  TempSrc: Temporal  SpO2: 96%  Weight: 78.8 kg  Height: 5\' 11"  (1.803 m)  PainSc: 0-No pain   Body  mass index is 24.23 kg/m. GEN: NAD EYE: Sclerae anicteric ENT: MMM CV: Non-tachycardic GI: Soft, NT/ND NEURO:  Alert & Oriented x 3  Lab Results: No results for input(s): "WBC", "HGB", "HCT", "PLT" in the last 72 hours. BMET No results for input(s): "NA", "K", "CL", "CO2", "GLUCOSE", "BUN", "CREATININE", "CALCIUM" in the last 72 hours. LFT No results for input(s): "PROT", "ALBUMIN", "AST", "ALT", "ALKPHOS", "BILITOT", "BILIDIR", "IBILI" in the last 72 hours. PT/INR No results for input(s): "LABPROT", "INR" in the last 72 hours.   Impression / Plan: This is a 67 y.o.male who presents for  EGD/EUS for esophageal cancer staging with negative CT and PET.  The risks of an EUS including intestinal perforation, bleeding, infection, aspiration, and medication effects were discussed as was the possibility it may not give a definitive diagnosis if a biopsy is performed.  When a biopsy of the pancreas is done as part of the EUS, there is an additional risk of pancreatitis at the rate of about 1-2%.  It was explained that procedure related pancreatitis is typically mild, although it can be severe and even life threatening, which is why we do not perform random pancreatic biopsies and only biopsy a lesion/area we feel is concerning enough to warrant the risk.   The risks and benefits of endoscopic evaluation/treatment were discussed with the patient and/or family; these include but are not limited to the risk of perforation, infection, bleeding, missed lesions, lack of diagnosis, severe illness requiring hospitalization, as well as anesthesia and sedation related illnesses.  The patient's history has been reviewed, patient examined, no change in status, and deemed stable for procedure.  The patient and/or family is agreeable to proceed.    Corliss Parish, MD [facility] Office # [phone]

## 2023-07-28 ENCOUNTER — Encounter: Payer: Self-pay | Admitting: Thoracic Surgery (Cardiothoracic Vascular Surgery)

## 2023-07-28 ENCOUNTER — Telehealth: Payer: Self-pay

## 2023-07-28 ENCOUNTER — Institutional Professional Consult (permissible substitution) (INDEPENDENT_AMBULATORY_CARE_PROVIDER_SITE_OTHER): Payer: 59 | Admitting: Thoracic Surgery (Cardiothoracic Vascular Surgery)

## 2023-07-28 VITALS — BP 138/77 | HR 84 | Resp 20 | Ht 71.0 in | Wt 174.2 lb

## 2023-07-28 DIAGNOSIS — C155 Malignant neoplasm of lower third of esophagus: Secondary | ICD-10-CM | POA: Diagnosis not present

## 2023-07-28 NOTE — H&P (Signed)
GASTROENTEROLOGY PROCEDURE H&P NOTE   Primary Care Physician: Storm Frisk, MD  HPI: Jordan [name] is a 67 y.o. male who presents for EGD/EUS for esophageal cancer staging with negative CT and PET.   Past Medical History:  Diagnosis Date   Allergy    seasonal   Aneurysm of infrarenal abdominal aorta (HCC)    3.4 cm per angela mcclung pa 04-16-2020 office note   Arthritis    Colon cancer (HCC)    Compression fracture of thoracic vertebra with routine healing 04/30/2020   COPD (chronic obstructive pulmonary disease) (HCC)    Family history of adverse reaction to anesthesia    mother slow to awaken   GERD (gastroesophageal reflux disease)    Hepatitis    hepatitis c dx in remission per pt   Hypertension    Inguinal hernia    Multiple fractures 06/04/2020   Positive colorectal cancer screening using Cologuard test 08/27/2020   Substance abuse (HCC)    Wears glasses    for reading   Past Surgical History:  Procedure Laterality Date   COLONOSCOPY  11/11/2020   ENDOSCOPIC MUCOSAL RESECTION N/A 03/10/2021   Procedure: ENDOSCOPIC MUCOSAL RESECTION;  Surgeon: Lemar Lofty., MD;  Location: [facility];  Service: Gastroenterology;  Laterality: N/A;   FLEXIBLE SIGMOIDOSCOPY N/A 03/10/2021   Procedure: FLEXIBLE SIGMOIDOSCOPY;  Surgeon: Meridee Score Netty Starring., MD;  Location: [facility];  Service: Gastroenterology;  Laterality: N/A;   HEMOSTASIS CLIP PLACEMENT  03/10/2021   Procedure: HEMOSTASIS CLIP PLACEMENT;  Surgeon: Lemar Lofty., MD;  Location: [facility];  Service: Gastroenterology;;   INGUINAL HERNIA REPAIR Right 01/19/2021   Procedure: LAPAROSCOPIC RIGHT INGUINAL HERNIA REPAIR WITH MESH;  Surgeon: Stechschulte, Hyman Hopes, MD;  Location: [facility] [place];  Service: General;  Laterality: Right;   obstruction removed as a child   swallowed a peanut   POLYPECTOMY  03/10/2021   Procedure: POLYPECTOMY;  Surgeon: Mansouraty, Netty Starring., MD;   Location: [facility];  Service: Gastroenterology;;   POLYPECTOMY     SUBMUCOSAL LIFTING INJECTION  03/10/2021   Procedure: SUBMUCOSAL LIFTING INJECTION;  Surgeon: Lemar Lofty., MD;  Location: [facility];  Service: Gastroenterology;;   No current facility-administered medications for this encounter.   Current Outpatient Medications  Medication Sig Dispense Refill   albuterol (VENTOLIN HFA) 108 (90 Base) MCG/ACT inhaler Inhale 2 puffs into the lungs every 6 (six) hours as needed for wheezing or shortness of breath. 18 g 2   amLODipine (NORVASC) 10 MG tablet Take 1 tablet (10 mg total) by mouth daily. 90 tablet 2   fluticasone-salmeterol (ADVAIR HFA) 115-21 MCG/ACT inhaler Inhale 2 puffs into the lungs 2 (two) times daily. 12 g 12   Multiple Vitamin (MULTIVITAMIN ADULT PO) Take by mouth.     omeprazole (PRILOSEC) 40 MG capsule Take 1 capsule (40 mg total) by mouth daily before dinner. 60 capsule 3   promethazine-dextromethorphan (PROMETHAZINE-DM) 6.25-15 MG/5ML syrup Take 5 mLs by mouth 4 (four) times daily as needed for cough. 240 mL 1   sildenafil (VIAGRA) 100 MG tablet Take 1 tablet (100 mg total) by mouth daily as needed for erectile dysfunction. 10 tablet 3   No current facility-administered medications for this encounter.  Current Outpatient Medications:    albuterol (VENTOLIN HFA) 108 (90 Base) MCG/ACT inhaler, Inhale 2 puffs into the lungs every 6 (six) hours as needed for wheezing or shortness of breath., Disp: 18 g, Rfl: 2   amLODipine (NORVASC) 10 MG tablet, Take 1  tablet (10 mg total) by mouth daily., Disp: 90 tablet, Rfl: 2   fluticasone-salmeterol (ADVAIR HFA) 115-21 MCG/ACT inhaler, Inhale 2 puffs into the lungs 2 (two) times daily., Disp: 12 g, Rfl: 12   Multiple Vitamin (MULTIVITAMIN ADULT PO), Take by mouth., Disp: , Rfl:    omeprazole (PRILOSEC) 40 MG capsule, Take 1 capsule (40 mg total) by mouth daily before dinner., Disp: 60 capsule, Rfl: 3    promethazine-dextromethorphan (PROMETHAZINE-DM) 6.25-15 MG/5ML syrup, Take 5 mLs by mouth 4 (four) times daily as needed for cough., Disp: 240 mL, Rfl: 1   sildenafil (VIAGRA) 100 MG tablet, Take 1 tablet (100 mg total) by mouth daily as needed for erectile dysfunction., Disp: 10 tablet, Rfl: 3 No Known Allergies Family History  Problem Relation Age of Onset   Colon polyps Father    Stomach cancer Father 17   Cancer Sister        Small cell lung cancer   Colon polyps Sister    Cancer Sister        pancreatic cancer   Colon polyps Brother    Colon cancer Neg Hx    Esophageal cancer Neg Hx    Rectal cancer Neg Hx    Inflammatory bowel disease Neg Hx    Liver disease Neg Hx    Pancreatic cancer Neg Hx    Social History   Socioeconomic History   Marital status: Divorced    Spouse name: Not on file   Number of children: 0   Years of education: Not on file   Highest education level: Not on file  Occupational History   Not on file  Tobacco Use   Smoking status: Former    Current packs/day: 0.00    Types: Cigarettes    Quit date: 1976    Years since quitting: 48.7   Smokeless tobacco: Never  Vaping Use   Vaping status: Never Used  Substance and Sexual Activity   Alcohol use: Not Currently    Alcohol/week: 1.0 standard drink of alcohol    Types: 1 Cans of beer per week    Comment: quit 2000   Drug use: Yes    Frequency: 1.0 times per week    Types: Cocaine    Comment: last use Wednesday 06/28/23   Sexual activity: Not on file  Other Topics Concern   Not on file  Social History Narrative   Not on file   Social Determinants of Health   Financial Resource Strain: Low Risk  (05/31/2023)   Overall Financial Resource Strain (CARDIA)    Difficulty of Paying Living Expenses: Not hard at all  Food Insecurity: No Food Insecurity (05/31/2023)   Hunger Vital Sign    Worried About Running Out of Food in the Last Year: Never true    Ran Out of Food in the Last Year: Never true   Transportation Needs: No Transportation Needs (05/31/2023)   PRAPARE - Administrator, Civil Service (Medical): No    Lack of Transportation (Non-Medical): No  Physical Activity: Insufficiently Active (05/31/2023)   Exercise Vital Sign    Days of Exercise per Week: 3 days    Minutes of Exercise per Session: 30 min  Stress: No Stress Concern Present (05/31/2023)   [employer] of Occupational Health - Occupational Stress Questionnaire    Feeling of Stress : Not at all  Social Connections: Socially Isolated (05/31/2023)   Social Connection and Isolation Panel [NHANES]    Frequency of Communication with Friends and Family:  More than three times a week    Frequency of Social Gatherings with Friends and Family: Twice a week    Attends Religious Services: Never    Database administrator or Organizations: No    Attends Banker Meetings: Never    Marital Status: Divorced  Catering manager Violence: Not At Risk (05/31/2023)   Humiliation, Afraid, Rape, and Kick questionnaire    Fear of Current or Ex-Partner: No    Emotionally Abused: No    Physically Abused: No    Sexually Abused: No    Physical Exam: Today's Vitals   07/27/23 0930 07/27/23 1052 07/27/23 1100 07/27/23 1110  BP: (!) 160/73 124/74 (!) 148/80 133/84  Pulse: 68 69 72 66  Resp: 17 16 18 12   Temp: 98 F (36.7 C) (!) 97.3 F (36.3 C)    TempSrc: Temporal Temporal    SpO2: 96% 99% 96% 94%  Weight: 78.8 kg     Height: 5\' 11"  (1.803 m)     PainSc: 0-No pain  0-No pain 0-No pain   Body mass index is 24.23 kg/m. GEN: NAD EYE: Sclerae anicteric ENT: MMM CV: Non-tachycardic GI: Soft, NT/ND NEURO:  Alert & Oriented x 3  Lab Results: No results for input(s): "WBC", "HGB", "HCT", "PLT" in the last 72 hours. BMET No results for input(s): "NA", "K", "CL", "CO2", "GLUCOSE", "BUN", "CREATININE", "CALCIUM" in the last 72 hours. LFT No results for input(s): "PROT", "ALBUMIN", "AST", "ALT", "ALKPHOS",  "BILITOT", "BILIDIR", "IBILI" in the last 72 hours. PT/INR No results for input(s): "LABPROT", "INR" in the last 72 hours.   Impression / Plan: This is a 67 y.o.male who presents for EGD/EUS for esophageal cancer staging with negative CT and PET.   The risks of an EUS including intestinal perforation, bleeding, infection, aspiration, and medication effects were discussed as was the possibility it may not give a definitive diagnosis if a biopsy is performed.  When a biopsy of the pancreas is done as part of the EUS, there is an additional risk of pancreatitis at the rate of about 1-2%.  It was explained that procedure related pancreatitis is typically mild, although it can be severe and even life threatening, which is why we do not perform random pancreatic biopsies and only biopsy a lesion/area we feel is concerning enough to warrant the risk.  The risks and benefits of endoscopic evaluation/treatment were discussed with the patient and/or family; these include but are not limited to the risk of perforation, infection, bleeding, missed lesions, lack of diagnosis, severe illness requiring hospitalization, as well as anesthesia and sedation related illnesses.  The patient's history has been reviewed, patient examined, no change in status, and deemed stable for procedure.  The patient and/or family is agreeable to proceed.    Corliss Parish, MD [facility] Office # [phone]

## 2023-07-28 NOTE — Telephone Encounter (Signed)
Spoke with patient. Advised patient the per PCP OTC Delsym is recommended. Patient voices understanding

## 2023-07-28 NOTE — Telephone Encounter (Signed)
I am out of the office until tuesday with no e prescribe access. He can get OTC Delsym.

## 2023-07-28 NOTE — Telephone Encounter (Signed)
Patient came in requesting a refill on promethazine-dextromethorphan  6.25-15 MG/5ML syrup

## 2023-07-28 NOTE — Anesthesia Postprocedure Evaluation (Signed)
Anesthesia Post Note  Patient: Jordan [name]  Procedure(s) Performed: UPPER ENDOSCOPIC ULTRASOUND (EUS) RADIAL     Patient location during evaluation: PACU Anesthesia Type: MAC Level of consciousness: awake and alert Pain management: pain level controlled Vital Signs Assessment: post-procedure vital signs reviewed and stable Respiratory status: spontaneous breathing, nonlabored ventilation, respiratory function stable and patient connected to nasal cannula oxygen Cardiovascular status: stable and blood pressure returned to baseline Postop Assessment: no apparent nausea or vomiting Anesthetic complications: no  No notable events documented.  Last Vitals:  Vitals:   07/27/23 1100 07/27/23 1110  BP: (!) 148/80 133/84  Pulse: 72 66  Resp: 18 12  Temp:    SpO2: 96% 94%    Last Pain:  Vitals:   07/27/23 1110  TempSrc:   PainSc: 0-No pain                 Staley Lunz S

## 2023-07-30 NOTE — Assessment & Plan Note (Signed)
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  We have reviewed his case in the tumor board, and recommend biopsy of the sternal bone lesion.  This is scheduled for August 04, 2023. -He underwent a EUS staging, which showed 2 tumors in the distal esophagus, staged as T1smN0 and T2N0.  -If his bone biopsy is negative, I recommend neoadjuvant chemotherapy FLOT (docetaxel, oxaliplatin and 5-FU) every 2 weeks for 3 months, followed by surgery and additional adjuvant chemo for 3 months. -If he has gone biopsy shows metastatic esophageal cancer, then I will obtain additional molecular testing, and treatment with chemo and possible radiation, he will not be surgical candidate if he has bone metastasis. -Port placement by [employer] on September 20, he is agreeable.

## 2023-07-30 NOTE — Assessment & Plan Note (Signed)
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

## 2023-07-30 NOTE — Assessment & Plan Note (Signed)
-  He has successfully completed treatment for Hepatitis C with 8 weeks of Mavyret. His HCV is not detectable.

## 2023-07-30 NOTE — Assessment & Plan Note (Signed)
-  He has quit smoking completely -I recommend him to quit cocaine completely, he agrees -Follow-up with his PCP and pulmonologist Dr. Delford Field

## 2023-07-31 ENCOUNTER — Inpatient Hospital Stay (HOSPITAL_BASED_OUTPATIENT_CLINIC_OR_DEPARTMENT_OTHER): Payer: 59 | Admitting: Hematology

## 2023-07-31 ENCOUNTER — Inpatient Hospital Stay: Payer: 59 | Admitting: Licensed Clinical Social Worker

## 2023-07-31 ENCOUNTER — Encounter (HOSPITAL_COMMUNITY): Payer: Self-pay | Admitting: Gastroenterology

## 2023-07-31 ENCOUNTER — Inpatient Hospital Stay: Payer: 59

## 2023-07-31 VITALS — BP 133/73 | HR 72 | Temp 98.2°F | Resp 18 | Ht 71.0 in | Wt 174.5 lb

## 2023-07-31 DIAGNOSIS — Z7951 Long term (current) use of inhaled steroids: Secondary | ICD-10-CM | POA: Diagnosis not present

## 2023-07-31 DIAGNOSIS — B182 Chronic viral hepatitis C: Secondary | ICD-10-CM

## 2023-07-31 DIAGNOSIS — C155 Malignant neoplasm of lower third of esophagus: Secondary | ICD-10-CM

## 2023-07-31 DIAGNOSIS — Z87891 Personal history of nicotine dependence: Secondary | ICD-10-CM | POA: Diagnosis not present

## 2023-07-31 DIAGNOSIS — K219 Gastro-esophageal reflux disease without esophagitis: Secondary | ICD-10-CM | POA: Diagnosis not present

## 2023-07-31 DIAGNOSIS — K449 Diaphragmatic hernia without obstruction or gangrene: Secondary | ICD-10-CM | POA: Diagnosis not present

## 2023-07-31 DIAGNOSIS — J4489 Other specified chronic obstructive pulmonary disease: Secondary | ICD-10-CM | POA: Diagnosis not present

## 2023-07-31 DIAGNOSIS — C189 Malignant neoplasm of colon, unspecified: Secondary | ICD-10-CM

## 2023-07-31 DIAGNOSIS — I10 Essential (primary) hypertension: Secondary | ICD-10-CM | POA: Diagnosis not present

## 2023-07-31 DIAGNOSIS — J449 Chronic obstructive pulmonary disease, unspecified: Secondary | ICD-10-CM | POA: Diagnosis not present

## 2023-07-31 DIAGNOSIS — Z79899 Other long term (current) drug therapy: Secondary | ICD-10-CM | POA: Diagnosis not present

## 2023-07-31 DIAGNOSIS — Z9221 Personal history of antineoplastic chemotherapy: Secondary | ICD-10-CM | POA: Diagnosis not present

## 2023-07-31 DIAGNOSIS — Z923 Personal history of irradiation: Secondary | ICD-10-CM | POA: Diagnosis not present

## 2023-07-31 DIAGNOSIS — B1920 Unspecified viral hepatitis C without hepatic coma: Secondary | ICD-10-CM | POA: Diagnosis not present

## 2023-07-31 LAB — CMP (CANCER CENTER ONLY)
ALT: 12 U/L (ref 0–44)
AST: 13 U/L — ABNORMAL LOW (ref 15–41)
Albumin: 4 g/dL (ref 3.5–5.0)
Alkaline Phosphatase: 87 U/L (ref 38–126)
Anion gap: 5 (ref 5–15)
BUN: 32 mg/dL — ABNORMAL HIGH (ref 8–23)
CO2: 29 mmol/L (ref 22–32)
Calcium: 9.5 mg/dL (ref 8.9–10.3)
Chloride: 104 mmol/L (ref 98–111)
Creatinine: 1.68 mg/dL — ABNORMAL HIGH (ref 0.61–1.24)
GFR, Estimated: 44 mL/min — ABNORMAL LOW (ref >60)
Glucose, Bld: 122 mg/dL — ABNORMAL HIGH (ref 70–99)
Potassium: 4.9 mmol/L (ref 3.5–5.1)
Sodium: 138 mmol/L (ref 135–145)
Total Bilirubin: 0.4 mg/dL (ref 0.3–1.2)
Total Protein: 7.2 g/dL (ref 6.5–8.1)

## 2023-07-31 LAB — CBC WITH DIFFERENTIAL (CANCER CENTER ONLY)
Abs Immature Granulocytes: 0.03 10*3/uL (ref 0.00–0.07)
Basophils Absolute: 0.1 10*3/uL (ref 0.0–0.1)
Basophils Relative: 1 %
Eosinophils Absolute: 0.3 10*3/uL (ref 0.0–0.5)
Eosinophils Relative: 4 %
HCT: 50.5 % (ref 39.0–52.0)
Hemoglobin: 17.1 g/dL — ABNORMAL HIGH (ref 13.0–17.0)
Immature Granulocytes: 1 %
Lymphocytes Relative: 21 %
Lymphs Abs: 1.4 10*3/uL (ref 0.7–4.0)
MCH: 29.8 pg (ref 26.0–34.0)
MCHC: 33.9 g/dL (ref 30.0–36.0)
MCV: 88.1 fL (ref 80.0–100.0)
Monocytes Absolute: 0.6 10*3/uL (ref 0.1–1.0)
Monocytes Relative: 10 %
Neutro Abs: 4.2 10*3/uL (ref 1.7–7.7)
Neutrophils Relative %: 63 %
Platelet Count: 258 10*3/uL (ref 150–400)
RBC: 5.73 MIL/uL (ref 4.22–5.81)
RDW: 13.8 % (ref 11.5–15.5)
WBC Count: 6.5 10*3/uL (ref 4.0–10.5)
nRBC: 0 % (ref 0.0–0.2)

## 2023-07-31 NOTE — Progress Notes (Signed)
[facility] CSW Progress Note  Visual merchandiser  received a request to compose a letter to advocate for pt's sister-in-law to be approved for an expedited Visa which would allow her to fly over from [place]  with pt's brother to assist w/ the medical care pt will need while going through treatment as well as take care of pt's 67 y/o mother for which pt is the primary care taker.  Letter composed and provided to pt.  CSW to remain available as appropriate throughout duration of treatment to provide support.    Rachel Moulds, LCSW Clinical Social Worker [facility]

## 2023-07-31 NOTE — Progress Notes (Signed)
[facility]   [phone] Fax:([phone] [name]   Clinic Follow up Note   Patient Care Team: Jordan Frisk, MD as PCP - General (Pulmonary Disease) Jordan Starch, MD as Consulting Physician (Dermatology) Jordan Speak, FNP as Nurse Practitioner (Infectious Diseases) Jordan [name] (Physician Assistant) Jordan Bienenstock, MD as Consulting Physician (Orthopedic Surgery) Jordan Mood, MD as Consulting Physician (Hematology and Oncology)  Date of Service:  07/31/2023  CHIEF COMPLAINT: f/u of esophageal cancer   CURRENT THERAPY:  Workup pending  ASSESSMENT:  Jordan [name] is a 67 y.o. 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  We have reviewed his case in the tumor board, and recommend biopsy of the sternal bone lesion.  This is scheduled for August 04, 2023. -He underwent a EUS staging, which showed 2 tumors in the distal esophagus, staged as T1smN0 and T2N0.  -If his bone biopsy is negative, I recommend neoadjuvant chemotherapy FLOT (docetaxel, oxaliplatin and 5-FU) every 2 weeks for 3 months, followed by surgery and additional adjuvant chemo for 3 months.  The patient is not interested in esophagectomy, he values his quality of life more than quantity of life.  I discussed the other option of concurrent chemoradiation with weekly carbo and Taxol, followed by consolidation chemo FOLFOX for 3 months, and immunotherapy if he  has residual disease.  He is more interested in this approach. -If he has gone biopsy shows metastatic esophageal cancer, then I will obtain additional molecular testing, and treatment with chemo and possible radiation, he will not be surgical candidate if he has bone metastasis. -Sternal bone biopsy and port placement by IR on September 20, he is agreeable.  Chronic hepatitis C without hepatic coma (HCC) -He has successfully completed treatment for Hepatitis C with 8 weeks of Mavyret. His HCV is not detectable.  COPD with chronic bronchitis (HCC) -He has quit smoking completely -I recommend him to quit cocaine completely, he agrees -Follow-up with his PCP and pulmonologist Dr. Delford Field     PLAN: -MMR-pending -I recommend Chemo and radiation. -Pt has biopsy and port placement schedule for 9/20 -phone visit on 9/25     INTERVAL HISTORY:  Jordan [name] is here for a follow up of esophageal cancer. He was last seen by me on 07/13/2023. He presents to the clinic accompanied by mother and father. Pt went to see Dr.Lightfoot.  l other systems were reviewed with the patient and are negative.  MEDICAL HISTORY:  Past Medical History:  Diagnosis Date   Allergy    seasonal   Aneurysm of infrarenal abdominal aorta (HCC)    3.4 cm per angela mcclung pa 04-16-2020 office note   Arthritis    Colon cancer (HCC)    Compression fracture of thoracic vertebra with routine healing 04/30/2020   COPD (chronic obstructive pulmonary disease) (HCC)    Family history of adverse reaction to anesthesia    mother slow to awaken   GERD (gastroesophageal reflux disease)    Hepatitis    hepatitis c dx in  remission per pt   Hypertension    Inguinal hernia    Multiple fractures 06/04/2020   Positive colorectal cancer screening using Cologuard test 08/27/2020   Substance abuse (HCC)    Wears glasses    for reading    SURGICAL HISTORY: Past Surgical History:  Procedure Laterality Date   COLONOSCOPY   11/11/2020   ENDOSCOPIC MUCOSAL RESECTION N/A 03/10/2021   Procedure: ENDOSCOPIC MUCOSAL RESECTION;  Surgeon: Lemar Lofty., MD;  Location: [facility];  Service: Gastroenterology;  Laterality: N/A;   ESOPHAGOGASTRODUODENOSCOPY (EGD) WITH PROPOFOL N/A 07/27/2023   Procedure: ESOPHAGOGASTRODUODENOSCOPY (EGD) WITH PROPOFOL;  Surgeon: Meridee Score Netty Starring., MD;  Location: [facility];  Service: Gastroenterology;  Laterality: N/A;   EUS N/A 07/27/2023   Procedure: UPPER ENDOSCOPIC ULTRASOUND (EUS) RADIAL;  Surgeon: Lemar Lofty., MD;  Location: [facility];  Service: Gastroenterology;  Laterality: N/A;   FLEXIBLE SIGMOIDOSCOPY N/A 03/10/2021   Procedure: FLEXIBLE SIGMOIDOSCOPY;  Surgeon: Meridee Score Netty Starring., MD;  Location: [facility];  Service: Gastroenterology;  Laterality: N/A;   HEMOSTASIS CLIP PLACEMENT  03/10/2021   Procedure: HEMOSTASIS CLIP PLACEMENT;  Surgeon: Lemar Lofty., MD;  Location: [facility];  Service: Gastroenterology;;   INGUINAL HERNIA REPAIR Right 01/19/2021   Procedure: LAPAROSCOPIC RIGHT INGUINAL HERNIA REPAIR WITH MESH;  Surgeon: Stechschulte, Hyman Hopes, MD;  Location: [facility] ;  Service: General;  Laterality: Right;   obstruction removed as a child   swallowed a peanut   POLYPECTOMY  03/10/2021   Procedure: POLYPECTOMY;  Surgeon: Mansouraty, Netty Starring., MD;  Location: [facility];  Service: Gastroenterology;;   POLYPECTOMY     SUBMUCOSAL LIFTING INJECTION  03/10/2021   Procedure: SUBMUCOSAL LIFTING INJECTION;  Surgeon: Lemar Lofty., MD;  Location: [facility];  Service: Gastroenterology;;    I have reviewed the social history and family history with the patient and they are unchanged from previous note.  ALLERGIES:  has No Known Allergies.  MEDICATIONS:  Current Outpatient Medications  Medication Sig Dispense Refill   albuterol (VENTOLIN HFA) 108 (90 Base) MCG/ACT inhaler Inhale 2 puffs into the  lungs every 6 (six) hours as needed for wheezing or shortness of breath. 18 g 2   amLODipine (NORVASC) 10 MG tablet Take 1 tablet (10 mg total) by mouth daily. 90 tablet 2   fluticasone-salmeterol (ADVAIR HFA) 115-21 MCG/ACT inhaler Inhale 2 puffs into the lungs 2 (two) times daily. 12 g 12   Multiple Vitamin (MULTIVITAMIN ADULT PO) Take by mouth.     omeprazole (PRILOSEC) 40 MG capsule Take 1 capsule (40 mg total) by mouth daily before dinner. 60 capsule 3   promethazine-dextromethorphan (PROMETHAZINE-DM) 6.25-15 MG/5ML syrup Take 5 mLs by mouth 4 (four) times daily as needed for cough. 240 mL 1   sildenafil (VIAGRA) 100 MG tablet Take 1 tablet (100 mg total) by mouth daily as needed for erectile dysfunction. 10 tablet 3   No current facility-administered medications for this visit.    PHYSICAL EXAMINATION: ECOG PERFORMANCE STATUS: 0 - Asymptomatic  Vitals:   07/31/23 1502  BP: 133/73  Pulse: 72  Resp: 18  Temp: 98.2 F (36.8 C)  SpO2: 96%   Wt Readings from Last 3 Encounters:  07/31/23 174 lb 8 oz (79.2 kg)  07/28/23 174 lb 3.2 oz (79 kg)  07/27/23 173 lb 11.6 oz (78.8 kg)     GENERAL:alert, no distress and comfortable SKIN: skin color normal, no rashes or significant lesions EYES: normal, Conjunctiva are pink and non-injected, sclera clear  NEURO: alert &  oriented x 3 with fluent speech  LABORATORY DATA:  I have reviewed the data as listed    Latest Ref Rng & Units 07/31/2023    2:34 PM 06/30/2023   10:30 AM 05/16/2023    2:16 PM  CBC  WBC 4.0 - 10.5 K/uL 6.5  5.9  5.6   Hemoglobin 13.0 - 17.0 g/dL 78.2  95.6  21.3   Hematocrit 39.0 - 52.0 % 50.5  46.8  47.1   Platelets 150 - 400 K/uL 258  271.0  256         Latest Ref Rng & Units 07/31/2023    2:34 PM 07/12/2023    4:47 PM 06/30/2023   10:30 AM  CMP  Glucose 70 - 99 mg/dL 086   578   BUN 8 - 23 mg/dL 32   31   Creatinine 4.69 - 1.24 mg/dL 6.29  5.28  4.13   Sodium 135 - 145 mmol/L 138   132   Potassium 3.5 -  5.1 mmol/L 4.9   4.1   Chloride 98 - 111 mmol/L 104   99   CO2 22 - 32 mmol/L 29   25   Calcium 8.9 - 10.3 mg/dL 9.5   8.8   Total Protein 6.5 - 8.1 g/dL 7.2   6.7   Total Bilirubin 0.3 - 1.2 mg/dL 0.4   0.5   Alkaline Phos 38 - 126 U/L 87   84   AST 15 - 41 U/L 13   14   ALT 0 - 44 U/L 12   12       RADIOGRAPHIC STUDIES: I have personally reviewed the radiological images as listed and agreed with the findings in the report. No results found.    Orders Placed This Encounter  Procedures   IR IMAGING GUIDED PORT INSERTION    Standing Status:   Future    Standing Expiration Date:   07/30/2024    Order Specific Question:   Reason for Exam (SYMPTOM  OR DIAGNOSIS REQUIRED)    Answer:   chemo    Order Specific Question:   Preferred Imaging Location?    Answer:   [facility]   Ambulatory referral to Radiation Oncology    Referral Priority:   Urgent    Referral Type:   Consultation    Referral Reason:   Specialty Services Required    Requested Specialty:   Radiation Oncology    Number of Visits Requested:   1   All questions were answered. The patient knows to call the clinic with any problems, questions or concerns. No barriers to learning was detected. The total time spent in the appointment was 40 minutes.     Jordan Mood, MD 07/31/2023   Carolin Coy, CMA, am acting as scribe for Jordan Mood, MD.   I have reviewed the above documentation for accuracy and completeness, and I agree with the above.

## 2023-07-31 NOTE — Progress Notes (Signed)
Nutrition Assessment   Reason for Assessment:   New esophageal cancer   ASSESSMENT:  67 year old male with esophageal cancer.  Past medical history of COPD, stage colon cancer, HTN, cocaine use, hepatitis C. Pending biopsy of sternum before plan of care determined.    Spoke with patient via phone for nutrition assessment.  Patient reports that his appetite is fine.  Currently lives with mother. Typically has breakfast. Most days of the week will have lunch (sandwich or burger).  Dinner is meat and couple of sides.  Drinks milkshakes.  Denies any trouble swallowing foods.       Medications: prilosec, MVI   Labs: creatinine 1.60, BUN 31, glucose 102   Anthropometrics:   Height: 71 inches Weight: 174 lb 3.2 oz UBW: 180 lb per patient 179 lb on 7/17 noted per chart BMI: 24  3% weight loss in the last month   Estimated Energy Needs  Kcals: 1975-2300 Protein: 98-115 g Fluid: 1975-2300 ml   NUTRITION DIAGNOSIS: Unintentional weight loss related to ?cancer as evidenced by 3% weight loss in the last month   INTERVENTION:  Discussed importance of nutrition and weight maintenance during treatment Encouraged foods high in protein Discussed oral nutrition supplements as options for added calories and protein Contact information given   MONITORING, EVALUATION, GOAL: weight trends, intake   Next Visit: to be determined once plan of care known  Norrin Shreffler B. Freida Busman, RD, LDN Registered Dietitian [phone]

## 2023-08-01 ENCOUNTER — Encounter: Payer: Self-pay | Admitting: Radiology

## 2023-08-02 ENCOUNTER — Encounter: Payer: Self-pay | Admitting: Radiation Oncology

## 2023-08-03 ENCOUNTER — Other Ambulatory Visit: Payer: Self-pay | Admitting: Radiology

## 2023-08-03 DIAGNOSIS — M899 Disorder of bone, unspecified: Secondary | ICD-10-CM

## 2023-08-03 NOTE — H&P (Signed)
Chief Complaint: Sternal bone lesion. Team is requesting a bone biopsy for further evaluation of infection or metastasis  Referring Physician(s): Feng,Yan  Supervising Physician: Roanna Banning  Patient Status: [facility] - Out-pt  History of Present Illness: Jordan [name] is a 67 y.o. male. outpatient. History of HTN, Hepatitis, GERD, COPD, colon cancer. AAA, lower esophageal cancer. Found to have a sternal bone lesion. Team is requesting a bone biopsy for further evaluation of infection or metastasis. Procedure reviewed and approved by Dr. Donne Hazel.  Currently without any significant complaints. Patient alert and laying in bed,calm. Denies any fevers, headache, chest pain, SOB, cough, abdominal pain, nausea, vomiting or bleeding. Return precautions and treatment recommendations and follow-up discussed with the patient  who is agreeable with the plan.   Past Medical History:  Diagnosis Date   Allergy    seasonal   Aneurysm of infrarenal abdominal aorta (HCC)    4.2 cm 07/12/23   Arthritis    Colon cancer (HCC)    Compression fracture of thoracic vertebra with routine healing 04/30/2020   COPD (chronic obstructive pulmonary disease) (HCC)    Family history of adverse reaction to anesthesia    mother slow to awaken   GERD (gastroesophageal reflux disease)    Hepatitis    hepatitis c dx in remission per pt   Hypertension    Inguinal hernia    Multiple fractures 06/04/2020   Positive colorectal cancer screening using Cologuard test 08/27/2020   Substance abuse (HCC)    Wears glasses    for reading    Past Surgical History:  Procedure Laterality Date   COLONOSCOPY  11/11/2020   ENDOSCOPIC MUCOSAL RESECTION N/A 03/10/2021   Procedure: ENDOSCOPIC MUCOSAL RESECTION;  Surgeon: Lemar Lofty., MD;  Location: [facility];  Service: Gastroenterology;  Laterality: N/A;   ESOPHAGOGASTRODUODENOSCOPY (EGD) WITH PROPOFOL N/A 07/27/2023   Procedure: ESOPHAGOGASTRODUODENOSCOPY  (EGD) WITH PROPOFOL;  Surgeon: Meridee Score Netty Starring., MD;  Location: [facility];  Service: Gastroenterology;  Laterality: N/A;   EUS N/A 07/27/2023   Procedure: UPPER ENDOSCOPIC ULTRASOUND (EUS) RADIAL;  Surgeon: Lemar Lofty., MD;  Location: [facility];  Service: Gastroenterology;  Laterality: N/A;   FLEXIBLE SIGMOIDOSCOPY N/A 03/10/2021   Procedure: FLEXIBLE SIGMOIDOSCOPY;  Surgeon: Meridee Score Netty Starring., MD;  Location: [facility];  Service: Gastroenterology;  Laterality: N/A;   HEMOSTASIS CLIP PLACEMENT  03/10/2021   Procedure: HEMOSTASIS CLIP PLACEMENT;  Surgeon: Lemar Lofty., MD;  Location: [facility];  Service: Gastroenterology;;   INGUINAL HERNIA REPAIR Right 01/19/2021   Procedure: LAPAROSCOPIC RIGHT INGUINAL HERNIA REPAIR WITH MESH;  Surgeon: Stechschulte, Hyman Hopes, MD;  Location: [facility];  Service: General;  Laterality: Right;   obstruction removed as a child   swallowed a peanut   POLYPECTOMY  03/10/2021   Procedure: POLYPECTOMY;  Surgeon: Mansouraty, Netty Starring., MD;  Location: [facility];  Service: Gastroenterology;;   POLYPECTOMY     SUBMUCOSAL LIFTING INJECTION  03/10/2021   Procedure: SUBMUCOSAL LIFTING INJECTION;  Surgeon: Lemar Lofty., MD;  Location: [facility];  Service: Gastroenterology;;    Allergies: Patient has no known allergies.  Medications: Prior to Admission medications   Medication Sig Start Date End Date Taking? Authorizing Provider  albuterol (VENTOLIN HFA) 108 (90 Base) MCG/ACT inhaler Inhale 2 puffs into the lungs every 6 (six) hours as needed for wheezing or shortness of breath. 07/04/23   Storm Frisk, MD  amLODipine (NORVASC) 10 MG tablet Take 1 tablet (10 mg total) by mouth daily. 07/04/23  Storm Frisk, MD  fluticasone-salmeterol (ADVAIR HFA) [phone] MCG/ACT inhaler Inhale 2 puffs into the lungs 2 (two) times daily. 05/16/23   Storm Frisk, MD  Multiple Vitamin (MULTIVITAMIN  ADULT PO) Take by mouth.    [provider]  omeprazole (PRILOSEC) 40 MG capsule Take 1 capsule (40 mg total) by mouth daily before dinner. 07/04/23   Storm Frisk, MD  promethazine-dextromethorphan (PROMETHAZINE-DM) 6.25-15 MG/5ML syrup Take 5 mLs by mouth 4 (four) times daily as needed for cough. 07/04/23   Storm Frisk, MD  sildenafil (VIAGRA) 100 MG tablet Take 1 tablet (100 mg total) by mouth daily as needed for erectile dysfunction. 10/11/22 10/11/23  Storm Frisk, MD     Family History  Problem Relation Age of Onset   Colon polyps Father    Stomach cancer Father 19   Cancer Sister        Small cell lung cancer   Colon polyps Sister    Cancer Sister        pancreatic cancer   Colon polyps Brother    Colon cancer Neg Hx    Esophageal cancer Neg Hx    Rectal cancer Neg Hx    Inflammatory bowel disease Neg Hx    Liver disease Neg Hx    Pancreatic cancer Neg Hx     Social History   Socioeconomic History   Marital status: Divorced    Spouse name: Not on file   Number of children: 0   Years of education: Not on file   Highest education level: Not on file  Occupational History   Not on file  Tobacco Use   Smoking status: Former    Types: Cigarettes    Start date: 2022    Quit date: 1975    Years since quitting: 49.7   Smokeless tobacco: Never  Vaping Use   Vaping status: Never Used  Substance and Sexual Activity   Alcohol use: Not Currently    Alcohol/week: 1.0 standard drink of alcohol    Types: 1 Cans of beer per week    Comment: quit 2000   Drug use: Yes    Frequency: 1.0 times per week    Types: Cocaine    Comment: last use Wednesday 06/28/23   Sexual activity: Not on file  Other Topics Concern   Not on file  Social History Narrative   Not on file   Social Determinants of Health   Financial Resource Strain: Low Risk  (05/31/2023)   Overall Financial Resource Strain (CARDIA)    Difficulty of Paying Living Expenses: Not hard at all   Food Insecurity: No Food Insecurity (05/31/2023)   Hunger Vital Sign    Worried About Running Out of Food in the Last Year: Never true    Ran Out of Food in the Last Year: Never true  Transportation Needs: No Transportation Needs (05/31/2023)   PRAPARE - Administrator, Civil Service (Medical): No    Lack of Transportation (Non-Medical): No  Physical Activity: Insufficiently Active (05/31/2023)   Exercise Vital Sign    Days of Exercise per Week: 3 days    Minutes of Exercise per Session: 30 min  Stress: No Stress Concern Present (05/31/2023)   [employer] of Occupational Health - Occupational Stress Questionnaire    Feeling of Stress : Not at all  Social Connections: Socially Isolated (05/31/2023)   Social Connection and Isolation Panel [NHANES]    Frequency of Communication with Friends and Family: More  than three times a week    Frequency of Social Gatherings with Friends and Family: Twice a week    Attends Religious Services: Never    Database administrator or Organizations: No    Attends Engineer, structural: Never    Marital Status: Divorced     Review of Systems: A 12 point ROS discussed and pertinent positives are indicated in the HPI above.  All other systems are negative.  Review of Systems  Constitutional:  Negative for fever.  HENT:  Negative for congestion.   Respiratory:  Negative for cough and shortness of breath.   Cardiovascular:  Negative for chest pain.  Gastrointestinal:  Negative for abdominal pain.  Neurological:  Negative for headaches.  Psychiatric/Behavioral:  Negative for behavioral problems and confusion.     Vital Signs: BP (!) 169/84 (BP Location: Right Arm)   Pulse 64   Temp 98.2 F (36.8 C) (Oral)   Resp 16   Ht 5' 11.8" (1.824 m)   Wt 173 lb (78.5 kg)   SpO2 96%   BMI 23.59 kg/m   Advance Care Plan: The advanced care plan/surrogate decision maker was discussed at the time of visit and documented in the medical  record.    Physical Exam Vitals and nursing note reviewed.  Constitutional:      Appearance: He is well-developed.  HENT:     Head: Normocephalic.  Cardiovascular:     Rate and Rhythm: Normal rate and regular rhythm.     Heart sounds: Normal heart sounds.  Pulmonary:     Effort: Pulmonary effort is normal.     Breath sounds: Normal breath sounds.  Musculoskeletal:        General: Normal range of motion.     Cervical back: Normal range of motion.  Skin:    General: Skin is dry.  Neurological:     General: No focal deficit present.     Mental Status: He is alert and oriented to person, place, and time.  Psychiatric:        Mood and Affect: Mood normal.        Behavior: Behavior normal.        Thought Content: Thought content normal.        Judgment: Judgment normal.     Imaging: NM PET Image Initial (PI) Skull Base To Thigh  Result Date: 07/24/2023 CLINICAL DATA:  Initial treatment strategy for lower esophageal cancer. EXAM: NUCLEAR MEDICINE PET SKULL BASE TO THIGH TECHNIQUE: 8.6 mCi F-18 FDG was injected intravenously. Full-ring PET imaging was performed from the skull base to thigh after the radiotracer. CT data was obtained and used for attenuation correction and anatomic localization. Fasting blood glucose: 105 mg/dl COMPARISON:  CT chest abdomen pelvis dated 07/12/2023 FINDINGS: Mediastinal blood pool activity: SUV max 2.3 Liver activity: SUV max NA NECK: No hypermetabolic cervical lymphadenopathy. Incidental CT findings: None. CHEST: Wall thickening/mass involving the distal esophagus/GE junction (series 4/image 90), max SUV 24.4, corresponding to the patient's known primary esophageal cancer. No suspicious pulmonary nodules. No hypermetabolic thoracic lymphadenopathy. Incidental CT findings: Atherosclerotic calcifications of the aortic arch. Mild three-vessel coronary atherosclerosis. ABDOMEN/PELVIS: No abnormal hypermetabolism in the liver, spleen, pancreas, or adrenal glands.  No hypermetabolic abdominopelvic lymphadenopathy. Incidental CT findings: 4.2 cm infrarenal aortic aneurysm (series 4/image 127). Atherosclerotic calcifications abdominal aorta and branch vessels. Mild sigmoid diverticulosis, without evidence of diverticulitis. Small fat containing left inguinal hernia. Postsurgical changes related to prior right inguinal hernia repair. SKELETON: Sclerosis/destructive changes involving the  lower sternum, max SUV 4.4. This appearance continues to favor infection/osteomyelitis No focal hypermetabolic activity suggestive of skeletal metastasis. Incidental CT findings: Old bilateral posterior rib fracture deformities. Old left scapular fracture deformity. IMPRESSION: Distal esophageal/GE junction mass, corresponding to the patient's known primary esophageal cancer. No evidence of metastatic disease. Sclerosis/destructive changes involving the lower sternum, favoring infection/osteomyelitis. 4.2 cm infrarenal abdominal aortic aneurysm. Recommend follow-up every 12 months and vascular consultation. This recommendation follows ACR consensus guidelines: White Paper of the ACR Incidental Findings Committee II on Vascular Findings. J Am Coll Radiol 2013; 10:789-794. Electronically Signed   By: Charline Bills M.D.   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 * Tracking Code: BO * EXAM: CT CHEST, ABDOMEN, AND PELVIS WITH CONTRAST TECHNIQUE: Multidetector CT imaging of the chest, abdomen and pelvis was performed following the standard protocol during bolus administration of intravenous contrast. RADIATION DOSE REDUCTION: This exam was performed according to the departmental dose-optimization program which includes automated exposure control, adjustment of the mA and/or kV according to patient size and/or use of iterative reconstruction technique. CONTRAST:   80mL OMNIPAQUE IOHEXOL 300 MG/ML  SOLN COMPARISON:  06/12/2023 and 04/03/2020 FINDINGS: CT CHEST FINDINGS Cardiovascular: Coronary, aortic arch, and branch vessel atherosclerotic vascular disease. Mediastinum/Nodes: Small to moderate distal hiatal hernia. Wall thickening in the distal esophagus just above the hiatal hernia, probably correlating with the known malignancy. No paraesophageal adenopathy. Lungs/Pleura: Ground-glass density nodule at the right lung apex measuring 2.2 by 1.2 by 0.6 cm. This was not present 06/12/2023 and is therefore almost certainly benign/inflammatory. Airway thickening is present, suggesting bronchitis or reactive airways disease. Mild airway plugging in the left lower lobe. Musculoskeletal: Multiple old rib fractures a and old healed left scapular fracture. Lucent mid sternal body lesion with surrounding sclerosis cortical destruction, similar to prior but with little if any gas density. Abnormal surrounding retrosternal and presternal density in this vicinity. Old compression fractures at T1, T2, T3, similar to prior. CT ABDOMEN PELVIS FINDINGS Hepatobiliary: Contracted gallbladder. No focal hepatic parenchymal lesion to suggest hepatic metastatic disease. 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 Spleen: Unremarkable Adrenals/Urinary Tract: Mild scarring in the right kidney. Adrenal glands and urinary bladder unremarkable. Stomach/Bowel: Unremarkable Vascular/Lymphatic: Atherosclerosis is present, including aortoiliac atherosclerotic disease. Infrarenal abdominal aortic aneurysm 4.2 cm in anterior-posterior dimension on image 78 series 2, previously 3.6 cm. Right gastric lymph node 0.8 cm in short axis on image 58 series 2, within normal size limits. No pathologic adenopathy observed. Reproductive: Unremarkable Other: No supplemental non-categorized findings. Musculoskeletal: Old healed left transverse process fractures at L1 through L3. IMPRESSION: 1. Wall  thickening in the distal esophagus just above the hiatal hernia, probably correlating with the known malignancy. No paraesophageal adenopathy or findings of distant metastatic disease. 2. Lucent mid sternal body lesion with surrounding sclerosis and cortical destruction, similar to prior but with little if any gas density. Given the prior gas density, infection is certainly favored over a metastatic lesion as this would be an unusual metastatic pathway. Correlate with signs and symptoms of infection in determining need for biopsy. 3. Ground-glass density nodule at the right lung apex measuring 2.2 by 1.2 by 0.6 cm. This was not present 06/12/2023 and is therefore almost certainly benign/inflammatory. 4. Airway thickening is present, suggesting bronchitis or reactive airways disease. Mild airway plugging in the left lower lobe. 5. Infrarenal abdominal aortic aneurysm 4.2 cm in  anterior-posterior dimension, previously 3.6 cm. Recommend follow-up every 12 months and vascular consultation. This recommendation follows ACR consensus guidelines: White Paper of the ACR Incidental Findings Committee II on Vascular Findings. J Am Coll Radiol 2013; 10:789-794. 6. Aortic atherosclerosis. Aortic Atherosclerosis (ICD10-I70.0). Electronically Signed   By: Gaylyn Rong M.D.   On: 07/12/2023 17:47    Labs:  CBC: Recent Labs    05/16/23 1416 06/30/23 1030 07/31/23 1434 08/04/23 0829  WBC 5.6 5.9 6.5 5.8  HGB 15.7 15.4 17.1* 15.9  HCT 47.1 46.8 50.5 47.8  PLT 256 271.0 258 227    COAGS: Recent Labs    08/04/23 0829  INR 1.0    BMP: Recent Labs    05/16/23 1416 06/30/23 1030 07/12/23 1647 07/31/23 1434  NA 141 132*  --  138  K 4.3 4.1  --  4.9  CL 105 99  --  104  CO2 21 25  --  29  GLUCOSE 133* 102*  --  122*  BUN 30* 31*  --  32*  CALCIUM 8.9 8.8  --  9.5  CREATININE 1.67* 1.86* 1.60* 1.68*  GFRNONAA  --   --   --  44*    LIVER FUNCTION TESTS: Recent Labs    05/16/23 1416  06/30/23 1030 07/31/23 1434  BILITOT <0.2 0.5 0.4  AST 13 14 13*  ALT 13 12 12   ALKPHOS 96 84 87  PROT 6.5 6.7 7.2  ALBUMIN 4.0 3.9 4.0    TUMOR MARKERS: Recent Labs    06/30/23 1030  CEA 6.5*    Assessment and Plan:  67 y.o. Male outpatient. History of HTN, Hepatitis, GERD, COPD, colon cancer. AAA, lower esophageal cancer. Found to have a sternal bone lesion. Team is requesting a bone biopsy for further evaluation of infection or metastasis. Procedure reviewed and approved by Dr. Donne Hazel.  PET scan from 9.9.24 reads Sclerosis/destructive changes involving the lower sternum, max SUV 4.4. This appearance continues to favor infection/osteomyelitis. Labs from 9.16.24 show BUN 32, Cr 1.68, AST 13, GFR < 44. All other labs and medications are within acceptable parameters. NKDA. Patient has been NPO since midnight.  Risks and benefits of sternal bone biopsy was discussed with the patient and/or patient's family including, but not limited to bleeding, infection, damage to adjacent structures or low yield requiring additional tests.  All of the questions were answered and there is agreement to proceed.  Consent signed and in chart.   Thank you for this interesting consult.  I greatly enjoyed meeting Jordan [name] and look forward to participating in their care.  A copy of this report was sent to the requesting provider on this date.  Electronically Signed: Alene Mires, NP 08/04/2023, 9:42 AM   I spent a total of  30 Minutes   in face to face in clinical consultation, greater than 50% of which was counseling/coordinating care for sternal bone biopsy

## 2023-08-04 ENCOUNTER — Encounter (HOSPITAL_COMMUNITY): Payer: Self-pay

## 2023-08-04 ENCOUNTER — Other Ambulatory Visit: Payer: Self-pay

## 2023-08-04 ENCOUNTER — Ambulatory Visit (HOSPITAL_COMMUNITY)
Admission: RE | Admit: 2023-08-04 | Discharge: 2023-08-04 | Disposition: A | Discharge status: Home / Self Care | Payer: 59 | Source: Ambulatory Visit | Attending: Hematology | Admitting: Hematology

## 2023-08-04 DIAGNOSIS — J449 Chronic obstructive pulmonary disease, unspecified: Secondary | ICD-10-CM | POA: Insufficient documentation

## 2023-08-04 DIAGNOSIS — K219 Gastro-esophageal reflux disease without esophagitis: Secondary | ICD-10-CM | POA: Insufficient documentation

## 2023-08-04 DIAGNOSIS — Z85038 Personal history of other malignant neoplasm of large intestine: Secondary | ICD-10-CM | POA: Insufficient documentation

## 2023-08-04 DIAGNOSIS — Z87891 Personal history of nicotine dependence: Secondary | ICD-10-CM | POA: Insufficient documentation

## 2023-08-04 DIAGNOSIS — Z801 Family history of malignant neoplasm of trachea, bronchus and lung: Secondary | ICD-10-CM | POA: Insufficient documentation

## 2023-08-04 DIAGNOSIS — M899 Disorder of bone, unspecified: Secondary | ICD-10-CM | POA: Diagnosis not present

## 2023-08-04 DIAGNOSIS — I10 Essential (primary) hypertension: Secondary | ICD-10-CM | POA: Diagnosis not present

## 2023-08-04 DIAGNOSIS — C155 Malignant neoplasm of lower third of esophagus: Secondary | ICD-10-CM | POA: Insufficient documentation

## 2023-08-04 DIAGNOSIS — C159 Malignant neoplasm of esophagus, unspecified: Secondary | ICD-10-CM | POA: Diagnosis not present

## 2023-08-04 LAB — PROTIME-INR
INR: 1 (ref 0.8–1.2)
Prothrombin Time: 13.3 seconds (ref 11.4–15.2)

## 2023-08-04 LAB — CBC
HCT: 47.8 % (ref 39.0–52.0)
Hemoglobin: 15.9 g/dL (ref 13.0–17.0)
MCH: 29.1 pg (ref 26.0–34.0)
MCHC: 33.3 g/dL (ref 30.0–36.0)
MCV: 87.5 fL (ref 80.0–100.0)
Platelets: 227 10*3/uL (ref 150–400)
RBC: 5.46 MIL/uL (ref 4.22–5.81)
RDW: 13.6 % (ref 11.5–15.5)
WBC: 5.8 10*3/uL (ref 4.0–10.5)
nRBC: 0 % (ref 0.0–0.2)

## 2023-08-04 MED ORDER — LIDOCAINE HCL 1 % IJ SOLN
INTRAMUSCULAR | Status: DC | PRN
  Administered 2023-08-04: 5 mL via INTRADERMAL

## 2023-08-04 MED ORDER — HYDROCODONE-ACETAMINOPHEN 5-325 MG PO TABS: 1.0000 | ORAL_TABLET | ORAL | Status: DC | PRN

## 2023-08-04 MED ORDER — FENTANYL CITRATE (PF) 100 MCG/2ML IJ SOLN
INTRAMUSCULAR | Status: AC
  Filled 2023-08-04: qty 4

## 2023-08-04 MED ORDER — MIDAZOLAM HCL 2 MG/2ML IJ SOLN
INTRAMUSCULAR | Status: AC
  Filled 2023-08-04: qty 4

## 2023-08-04 MED ORDER — LIDOCAINE HCL (PF) 1 % IJ SOLN
10.0000 mL | Freq: Once | INTRAMUSCULAR | Status: AC
  Administered 2023-08-04: 10 mL via INTRADERMAL
  Filled 2023-08-04: qty 10

## 2023-08-04 MED ORDER — MIDAZOLAM HCL 2 MG/2ML IJ SOLN
INTRAMUSCULAR | Status: DC | PRN
Start: 2023-08-04 — End: 2023-08-05
  Administered 2023-08-04: 1 mg via INTRAVENOUS

## 2023-08-04 MED ORDER — SODIUM CHLORIDE 0.9 % IV SOLN: INTRAVENOUS | Status: DC

## 2023-08-04 MED ORDER — FENTANYL CITRATE (PF) 100 MCG/2ML IJ SOLN
INTRAMUSCULAR | Status: DC | PRN
Start: 2023-08-04 — End: 2023-08-05
  Administered 2023-08-04 (×3): 25 ug via INTRAVENOUS

## 2023-08-04 NOTE — Procedures (Signed)
  Procedure:  CT core sternal lesion biopsy for surg path and culture Preprocedure diagnosis: Diagnoses of Malignant neoplasm of lower third of esophagus (HCC) and Bone lesion were pertinent to this visit. Postprocedure diagnosis: same EBL:    minimal Complications:   none immediate  See full dictation in [employer].  Thora Lance MD Main # [phone] Pager  [phone] Mobile [phone]

## 2023-08-04 NOTE — Progress Notes (Signed)
Patient and friend was given discharge instructions

## 2023-08-07 NOTE — Progress Notes (Signed)
GI Location of Tumor / Histology: Lower Esophageal Cancer   Jordan [name] presented for lung cancer screening CT scan and was noted to have a mass in his esophagus.    EUS: 2 tumors in the distal esophagus, staged as T1smN0 and T2N0  PET 07/24/2023: Distal esophageal/GE junction mass, corresponding to the patient's known primary esophageal cancer.  Sclerosis/destructive changes involving the lower sternum. This appearance continues to favor infection/osteomyelitis.  CT CAP 07/12/2023: Wall thickening in the distal esophagus just above the hiatal hernia, probably correlating with the known malignancy.  Lucent mid sternal body lesion with surrounding sclerosis and cortical destruction, similar to prior but with little if any gas density. Given the prior gas density, infection is certainly favored over a metastatic lesion as this would be an unusual metastatic pathway.   CT Chest 06/12/2023: Expansile lytic appearing lesion in the mid sternum estimated to measure approximately 4.0 x 2.2 x 2.5 cm, which has surrounding soft tissue prominence and small amount of gas in the mid sternum.   Biopsies of Esophageal Mass 06/30/2023    Past/Anticipated interventions by surgeon, if any:  Dr. Cliffton Asters 07/28/2023 - male with 2 distal esophageal lesions.  Most invasive is T2N0Mx.  -He is scheduled for biopsy of the area of the sternum with SUV uptake.  If negative, then we will plan for surgery after he has completed his neo-adjuvant therapy.    Past/Anticipated interventions by medical oncology, if any:  Dr. Mosetta Putt 07/31/2023 - -If his bone biopsy is negative, I recommend neoadjuvant chemotherapy FLOT (docetaxel, oxaliplatin and 5-FU) every 2 weeks for 3 months, followed by surgery and additional adjuvant chemo for 3 months.  -The patient is not interested in esophagectomy, he values his quality of life more than quantity of life.  -If his bone biopsy shows metastatic esophageal cancer, then I will obtain  additional molecular testing, and treatment with chemo and possible radiation, he will not be surgical candidate if he has bone metastasis.    Weight changes, if any: Roughly 10 pound weight loss over all.  He reports in the last month he has been pretty stable.  Bowel/Bladder complaints, if any: None  Nausea / Vomiting, if any: No  Pain issues, if any:  He reports some pain when coughing, clear/yellowish phlegm.Marland Kitchen  Appetite: Good.  SAFETY ISSUES: Prior radiation? No Pacemaker/ICD? No Possible current pregnancy? N/a Is the patient on methotrexate? No   Current Complaints/Details:

## 2023-08-08 ENCOUNTER — Ambulatory Visit: Payer: 59

## 2023-08-08 ENCOUNTER — Ambulatory Visit
Admission: RE | Admit: 2023-08-08 | Discharge: 2023-08-08 | Disposition: A | Discharge status: Home / Self Care | Payer: 59 | Source: Ambulatory Visit | Attending: Radiation Oncology | Admitting: Radiation Oncology

## 2023-08-08 ENCOUNTER — Encounter: Payer: Self-pay | Admitting: Radiation Oncology

## 2023-08-08 VITALS — BP 139/70 | HR 73 | Temp 97.3°F | Resp 18 | Ht 71.8 in | Wt 171.4 lb

## 2023-08-08 DIAGNOSIS — B1920 Unspecified viral hepatitis C without hepatic coma: Secondary | ICD-10-CM | POA: Insufficient documentation

## 2023-08-08 DIAGNOSIS — Z85038 Personal history of other malignant neoplasm of large intestine: Secondary | ICD-10-CM | POA: Insufficient documentation

## 2023-08-08 DIAGNOSIS — Z87891 Personal history of nicotine dependence: Secondary | ICD-10-CM | POA: Insufficient documentation

## 2023-08-08 DIAGNOSIS — K219 Gastro-esophageal reflux disease without esophagitis: Secondary | ICD-10-CM | POA: Diagnosis not present

## 2023-08-08 DIAGNOSIS — C189 Malignant neoplasm of colon, unspecified: Secondary | ICD-10-CM

## 2023-08-08 DIAGNOSIS — K4090 Unilateral inguinal hernia, without obstruction or gangrene, not specified as recurrent: Secondary | ICD-10-CM | POA: Diagnosis not present

## 2023-08-08 DIAGNOSIS — J449 Chronic obstructive pulmonary disease, unspecified: Secondary | ICD-10-CM | POA: Diagnosis not present

## 2023-08-08 DIAGNOSIS — R911 Solitary pulmonary nodule: Secondary | ICD-10-CM | POA: Diagnosis not present

## 2023-08-08 DIAGNOSIS — C155 Malignant neoplasm of lower third of esophagus: Secondary | ICD-10-CM

## 2023-08-08 DIAGNOSIS — I10 Essential (primary) hypertension: Secondary | ICD-10-CM | POA: Diagnosis not present

## 2023-08-08 DIAGNOSIS — I2510 Atherosclerotic heart disease of native coronary artery without angina pectoris: Secondary | ICD-10-CM | POA: Diagnosis not present

## 2023-08-08 DIAGNOSIS — R21 Rash and other nonspecific skin eruption: Secondary | ICD-10-CM | POA: Diagnosis not present

## 2023-08-08 DIAGNOSIS — Z79899 Other long term (current) drug therapy: Secondary | ICD-10-CM | POA: Diagnosis not present

## 2023-08-08 DIAGNOSIS — K449 Diaphragmatic hernia without obstruction or gangrene: Secondary | ICD-10-CM | POA: Insufficient documentation

## 2023-08-08 DIAGNOSIS — Z800 Family history of malignant neoplasm of digestive organs: Secondary | ICD-10-CM | POA: Diagnosis not present

## 2023-08-08 DIAGNOSIS — I700 Atherosclerosis of aorta: Secondary | ICD-10-CM | POA: Diagnosis not present

## 2023-08-08 DIAGNOSIS — I7143 Infrarenal abdominal aortic aneurysm, without rupture: Secondary | ICD-10-CM | POA: Diagnosis not present

## 2023-08-08 DIAGNOSIS — Z510 Encounter for antineoplastic radiation therapy: Secondary | ICD-10-CM | POA: Insufficient documentation

## 2023-08-08 NOTE — Progress Notes (Signed)
Radiation Oncology         ([phone]) [phone] ________________________________  Name: Jordan [name]        MRN: [id number]  Date of Service: 08/08/2023 DOB: [birth year]  YS:[id number], Charlcie Cradle, MD  Malachy Mood, MD     REFERRING PHYSICIAN: Malachy Mood, MD   DIAGNOSIS: The primary encounter diagnosis was Adenocarcinoma of colon [facility]). A diagnosis of Malignant neoplasm of lower third of esophagus (HCC) was also pertinent to this visit.   HISTORY OF PRESENT ILLNESS: Jordan [name] is a 67 y.o. male seen at the request of Dr. Mosetta Putt for new diagnosis of adenocarcinoma of the distal esophagus. Of note the patient was diagnosed with adenocarcinoma arising in a tubular adenoma with high-grade dysplasia in the sigmoid colon in December 2021 after undergoing a colonoscopy.  On 11/24/2020 he underwent a flexible sigmoidoscopy and pathology from that time was consistent with colonic mucosa with no evidence of carcinoma.  He returned for flexible sigmoidoscopy on 03/10/2021 and additional polypectomy was performed including transverse splenic flexure, rectal, and sigmoid polypoid lesion all of which were negative for high-grade dysplasia or carcinoma, and he has been followed in surveillance since.  He had repeat colonoscopy on 10/29/2021 and no residual polyp was seen at the polypectomy site and 4 other small adenomas were removed he is followed at 2-year intervals with colonoscopy.  He was being seen for lung cancer screening assessment and a CT of the chest identified circumferential masslike thickening in the distal esophagus measuring up to 3 cm in greatest dimension and a small pulmonary nodule measuring 4.1 mm in the left upper lobe.  He had an expansile lytic appearing lesion in the mid sternum measuring 4 cm.  He returned for endoscopy with Dr. Leone Payor which was performed on 06/30/2023 which showed a large submucosal mass and smaller mass or proximal in the distal esophagus measuring 30 and 35 cm from the incisors.   The larger area was partially obstructing, and partially circumferential and biopsies of both of the distal and proximal lesions were consistent with poorly differentiated adenocarcinoma.  A repeat CT chest abdomen pelvis on 07/12/2023 showed wall thickening in the distal esophagus consistent with the previous findings and a mid sternal body lesion possibly suspicious for infection.  There was a 2.2 cm right lung apex groundglass density nodule, evidence of airway thickening infrarenal abdominal aortic aneurysm measuring 4.2 cm and atherosclerotic disease.  A PET scan on 07/24/2023 showed hypermetabolic activity within the distal GE junction and esophagus with no evidence of metastatic disease, sclerosis/degenerative and destructive changes involving the lower sternum were noted and favored to be osteomyelitis. A biopsy on 08/04/23 is pending pathologic results.  He met with  Dr. Cliffton Asters and  appears to be a candidate for esophagectomy after neoadjuvant chemoradiation which he is seen today to discuss.     PREVIOUS RADIATION THERAPY: No   PAST MEDICAL HISTORY:  Past Medical History:  Diagnosis Date   Allergy    seasonal   Aneurysm of infrarenal abdominal aorta (HCC)    4.2 cm 07/12/23   Arthritis    Colon cancer (HCC)    Compression fracture of thoracic vertebra with routine healing 04/30/2020   COPD (chronic obstructive pulmonary disease) (HCC)    Family history of adverse reaction to anesthesia    mother slow to awaken   GERD (gastroesophageal reflux disease)    Hepatitis    hepatitis c dx in remission per pt   Hypertension    Inguinal hernia  Multiple fractures 06/04/2020   Positive colorectal cancer screening using Cologuard test 08/27/2020   Substance abuse (HCC)    Wears glasses    for reading       PAST SURGICAL HISTORY: Past Surgical History:  Procedure Laterality Date   COLONOSCOPY  11/11/2020   ENDOSCOPIC MUCOSAL RESECTION N/A 03/10/2021   Procedure: ENDOSCOPIC MUCOSAL  RESECTION;  Surgeon: Lemar Lofty., MD;  Location: [facility];  Service: Gastroenterology;  Laterality: N/A;   ESOPHAGOGASTRODUODENOSCOPY (EGD) WITH PROPOFOL N/A 07/27/2023   Procedure: ESOPHAGOGASTRODUODENOSCOPY (EGD) WITH PROPOFOL;  Surgeon: Meridee Score Netty Starring., MD;  Location: [facility];  Service: Gastroenterology;  Laterality: N/A;   EUS N/A 07/27/2023   Procedure: UPPER ENDOSCOPIC ULTRASOUND (EUS) RADIAL;  Surgeon: Lemar Lofty., MD;  Location: [facility];  Service: Gastroenterology;  Laterality: N/A;   FLEXIBLE SIGMOIDOSCOPY N/A 03/10/2021   Procedure: FLEXIBLE SIGMOIDOSCOPY;  Surgeon: Meridee Score Netty Starring., MD;  Location: [facility];  Service: Gastroenterology;  Laterality: N/A;   HEMOSTASIS CLIP PLACEMENT  03/10/2021   Procedure: HEMOSTASIS CLIP PLACEMENT;  Surgeon: Lemar Lofty., MD;  Location: [facility];  Service: Gastroenterology;;   INGUINAL HERNIA REPAIR Right 01/19/2021   Procedure: LAPAROSCOPIC RIGHT INGUINAL HERNIA REPAIR WITH MESH;  Surgeon: Stechschulte, Hyman Hopes, MD;  Location: [facility] ;  Service: General;  Laterality: Right;   obstruction removed as a child   swallowed a peanut   POLYPECTOMY  03/10/2021   Procedure: POLYPECTOMY;  Surgeon: Mansouraty, Netty Starring., MD;  Location: [facility];  Service: Gastroenterology;;   POLYPECTOMY     SUBMUCOSAL LIFTING INJECTION  03/10/2021   Procedure: SUBMUCOSAL LIFTING INJECTION;  Surgeon: Lemar Lofty., MD;  Location: [facility];  Service: Gastroenterology;;     FAMILY HISTORY:  Family History  Problem Relation Age of Onset   Colon polyps Father    Stomach cancer Father 9   Cancer Sister        Small cell lung cancer   Colon polyps Sister    Cancer Sister        pancreatic cancer   Colon polyps Brother    Colon cancer Neg Hx    Esophageal cancer Neg Hx    Rectal cancer Neg Hx    Inflammatory bowel disease Neg Hx    Liver disease Neg Hx     Pancreatic cancer Neg Hx      SOCIAL HISTORY:  reports that he quit smoking about 49 years ago. His smoking use included cigarettes. He started smoking about 2 years ago. He has never used smokeless tobacco. He reports that he does not currently use alcohol after a past usage of about 1.0 standard drink of alcohol per week. He reports current drug use. Frequency: 1.00 time per week. Drug: Cocaine.  The patient is divorced.  He lives in [place].   ALLERGIES: Patient has no known allergies.   MEDICATIONS:  Current Outpatient Medications  Medication Sig Dispense Refill   albuterol (VENTOLIN HFA) 108 (90 Base) MCG/ACT inhaler Inhale 2 puffs into the lungs every 6 (six) hours as needed for wheezing or shortness of breath. 18 g 2   amLODipine (NORVASC) 10 MG tablet Take 1 tablet (10 mg total) by mouth daily. 90 tablet 2   fluticasone-salmeterol (ADVAIR HFA) 115-21 MCG/ACT inhaler Inhale 2 puffs into the lungs 2 (two) times daily. 12 g 12   Multiple Vitamin (MULTIVITAMIN ADULT PO) Take by mouth.     omeprazole (PRILOSEC) 40 MG capsule Take 1 capsule (40 mg total) by mouth daily  before dinner. 60 capsule 3   promethazine-dextromethorphan (PROMETHAZINE-DM) 6.25-15 MG/5ML syrup Take 5 mLs by mouth 4 (four) times daily as needed for cough. 240 mL 1   sildenafil (VIAGRA) 100 MG tablet Take 1 tablet (100 mg total) by mouth daily as needed for erectile dysfunction. 10 tablet 3   No current facility-administered medications for this encounter.     REVIEW OF SYSTEMS: On review of systems, the patient reports that he is not interested in the recovery process of surgery. He reports he is eating what he likes but struggles with needing to eat soft foods given poor dentition. He is not having odynophagia or dysphagia. He reports unintentional weight loss of about 10 pounds in the last few months. He has several areas of irritation of his skin on his face and upper scalp. He describes scratching his beard  area after shaving it off yesterday. He is not sure if he was exposed to any poison ivy. His main complaint is of sternal pain that aggravates most days when he coughs and has a productive clear to yellowish sputum. No other complaints are verbalized.   PHYSICAL EXAM:  Wt Readings from Last 3 Encounters:  08/08/23 171 lb 6 oz (77.7 kg)  08/04/23 173 lb (78.5 kg)  07/31/23 174 lb 8 oz (79.2 kg)   Temp Readings from Last 3 Encounters:  08/08/23 (!) 97.3 F (36.3 C) (Temporal)  08/04/23 98.2 F (36.8 C) (Oral)  07/31/23 98.2 F (36.8 C) (Oral)   BP Readings from Last 3 Encounters:  08/08/23 139/70  08/04/23 (!) 155/74  07/31/23 133/73   Pulse Readings from Last 3 Encounters:  08/08/23 73  08/04/23 68  07/31/23 72   Pain Assessment Pain Score: 0-No pain/10  In general this is a chronically ill appearing caucasian male in no acute distress. He's alert and oriented x4 and appropriate throughout the examination. He has an erythematous macular rash over the distribution of his beard as well as lesions over his upper scalp that are keratotic in nature. He has poor dentition as well.  Cardiopulmonary assessment is negative for acute distress and he exhibits normal effort.     ECOG = 1  0 - Asymptomatic (Fully active, able to carry on all predisease activities without restriction)  1 - Symptomatic but completely ambulatory (Restricted in physically strenuous activity but ambulatory and able to carry out work of a light or sedentary nature. For example, light housework, office work)  2 - Symptomatic, <50% in bed during the day (Ambulatory and capable of all self care but unable to carry out any work activities. Up and about more than 50% of waking hours)  3 - Symptomatic, >50% in bed, but not bedbound (Capable of only limited self-care, confined to bed or chair 50% or more of waking hours)  4 - Bedbound (Completely disabled. Cannot carry on any self-care. Totally confined to bed or  chair)  5 - Death   Santiago Glad MM, Creech RH, Tormey DC, et al. [phone]). "Toxicity and response criteria of the [facility]". Am. Evlyn Clines. Oncol. 5 (6): 649-55    LABORATORY DATA:  Lab Results  Component Value Date   WBC 5.8 08/04/2023   HGB 15.9 08/04/2023   HCT 47.8 08/04/2023   MCV 87.5 08/04/2023   PLT 227 08/04/2023   Lab Results  Component Value Date   NA 138 07/31/2023   K 4.9 07/31/2023   CL 104 07/31/2023   CO2 29 07/31/2023   Lab Results  Component Value Date   ALT 12 07/31/2023   AST 13 (L) 07/31/2023   GGT 26 05/19/2022   ALKPHOS 87 07/31/2023   BILITOT 0.4 07/31/2023      RADIOGRAPHY: CT Biopsy  Result Date: 08/04/2023 CLINICAL DATA:  Destructive sternal lesion. History of esophageal carcinoma. EXAM: CT GUIDED CORE BONE BIOPSY OF STERNAL LESION ANESTHESIA/SEDATION: Intravenous Fentanyl and Versed 1mg  were administered as conscious sedation during continuous monitoring of the patient's level of consciousness and physiological / cardiorespiratory status by the radiology RN, with a total moderate sedation time of 20 minutes. PROCEDURE: The procedure risks, benefits, and alternatives were explained to the patient. Questions regarding the procedure were encouraged and answered. The patient understands and consents to the procedure. Select axial scans through the abdomen were obtained. The lesion was localized and an appropriate skin site was determined and marked. The operative field was prepped with chlorhexidinein a sterile fashion, and a sterile drape was applied covering the operative field. A sterile gown and sterile gloves were used for the procedure. Local anesthesia was provided with 1% Lidocaine. Under CT fluoroscopic guidance, an 11 gauge "bone biopsy needle was advanced to the margin of the lesion. Coaxial core samples were obtained. A final core samples through the guide needle itself was obtained. Sample sent for surgical pathology, Gram  stain and culture. Postprocedure scans show no hemorrhage or other apparent complication. The patient tolerated the procedure well. COMPLICATIONS: None immediate FINDINGS: Lytic destructive mid sternal lesion was localized. Representative core bone biopsy samples were obtained as above, sent for surgical path and culture. IMPRESSION: 1. Technically successful CT-guided sternum core biopsy. RADIATION DOSE REDUCTION: This exam was performed according to the departmental dose-optimization program which includes automated exposure control, adjustment of the mA and/or kV according to patient size and/or use of iterative reconstruction technique. Electronically Signed   By: Corlis Leak M.D.   On: 08/04/2023 16:18   NM PET Image Initial (PI) Skull Base To Thigh  Result Date: 07/24/2023 CLINICAL DATA:  Initial treatment strategy for lower esophageal cancer. EXAM: NUCLEAR MEDICINE PET SKULL BASE TO THIGH TECHNIQUE: 8.6 mCi F-18 FDG was injected intravenously. Full-ring PET imaging was performed from the skull base to thigh after the radiotracer. CT data was obtained and used for attenuation correction and anatomic localization. Fasting blood glucose: 105 mg/dl COMPARISON:  CT chest abdomen pelvis dated 07/12/2023 FINDINGS: Mediastinal blood pool activity: SUV max 2.3 Liver activity: SUV max NA NECK: No hypermetabolic cervical lymphadenopathy. Incidental CT findings: None. CHEST: Wall thickening/mass involving the distal esophagus/GE junction (series 4/image 90), max SUV 24.4, corresponding to the patient's known primary esophageal cancer. No suspicious pulmonary nodules. No hypermetabolic thoracic lymphadenopathy. Incidental CT findings: Atherosclerotic calcifications of the aortic arch. Mild three-vessel coronary atherosclerosis. ABDOMEN/PELVIS: No abnormal hypermetabolism in the liver, spleen, pancreas, or adrenal glands. No hypermetabolic abdominopelvic lymphadenopathy. Incidental CT findings: 4.2 cm infrarenal aortic  aneurysm (series 4/image 127). Atherosclerotic calcifications abdominal aorta and branch vessels. Mild sigmoid diverticulosis, without evidence of diverticulitis. Small fat containing left inguinal hernia. Postsurgical changes related to prior right inguinal hernia repair. SKELETON: Sclerosis/destructive changes involving the lower sternum, max SUV 4.4. This appearance continues to favor infection/osteomyelitis No focal hypermetabolic activity suggestive of skeletal metastasis. Incidental CT findings: Old bilateral posterior rib fracture deformities. Old left scapular fracture deformity. IMPRESSION: Distal esophageal/GE junction mass, corresponding to the patient's known primary esophageal cancer. No evidence of metastatic disease. Sclerosis/destructive changes involving the lower sternum, favoring infection/osteomyelitis. 4.2 cm infrarenal abdominal aortic aneurysm. Recommend  follow-up every 12 months and vascular consultation. This recommendation follows ACR consensus guidelines: White Paper of the ACR Incidental Findings Committee II on Vascular Findings. J Am Coll Radiol 2013; 10:789-794. Electronically Signed   By: Charline Bills M.D.   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 * Tracking Code: BO * EXAM: CT CHEST, ABDOMEN, AND PELVIS WITH CONTRAST TECHNIQUE: Multidetector CT imaging of the chest, abdomen and pelvis was performed following the standard protocol during bolus administration of intravenous contrast. RADIATION DOSE REDUCTION: This exam was performed according to the departmental dose-optimization program which includes automated exposure control, adjustment of the mA and/or kV according to patient size and/or use of iterative reconstruction technique. CONTRAST:  80mL OMNIPAQUE IOHEXOL 300 MG/ML  SOLN COMPARISON:  06/12/2023 and 04/03/2020 FINDINGS: CT CHEST  FINDINGS Cardiovascular: Coronary, aortic arch, and branch vessel atherosclerotic vascular disease. Mediastinum/Nodes: Small to moderate distal hiatal hernia. Wall thickening in the distal esophagus just above the hiatal hernia, probably correlating with the known malignancy. No paraesophageal adenopathy. Lungs/Pleura: Ground-glass density nodule at the right lung apex measuring 2.2 by 1.2 by 0.6 cm. This was not present 06/12/2023 and is therefore almost certainly benign/inflammatory. Airway thickening is present, suggesting bronchitis or reactive airways disease. Mild airway plugging in the left lower lobe. Musculoskeletal: Multiple old rib fractures a and old healed left scapular fracture. Lucent mid sternal body lesion with surrounding sclerosis cortical destruction, similar to prior but with little if any gas density. Abnormal surrounding retrosternal and presternal density in this vicinity. Old compression fractures at T1, T2, T3, similar to prior. CT ABDOMEN PELVIS FINDINGS Hepatobiliary: Contracted gallbladder. No focal hepatic parenchymal lesion to suggest hepatic metastatic disease. 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 Spleen: Unremarkable Adrenals/Urinary Tract: Mild scarring in the right kidney. Adrenal glands and urinary bladder unremarkable. Stomach/Bowel: Unremarkable Vascular/Lymphatic: Atherosclerosis is present, including aortoiliac atherosclerotic disease. Infrarenal abdominal aortic aneurysm 4.2 cm in anterior-posterior dimension on image 78 series 2, previously 3.6 cm. Right gastric lymph node 0.8 cm in short axis on image 58 series 2, within normal size limits. No pathologic adenopathy observed. Reproductive: Unremarkable Other: No supplemental non-categorized findings. Musculoskeletal: Old healed left transverse process fractures at L1 through L3. IMPRESSION: 1. Wall thickening in the distal esophagus just above the hiatal hernia, probably correlating with the known  malignancy. No paraesophageal adenopathy or findings of distant metastatic disease. 2. Lucent mid sternal body lesion with surrounding sclerosis and cortical destruction, similar to prior but with little if any gas density. Given the prior gas density, infection is certainly favored over a metastatic lesion as this would be an unusual metastatic pathway. Correlate with signs and symptoms of infection in determining need for biopsy. 3. Ground-glass density nodule at the right lung apex measuring 2.2 by 1.2 by 0.6 cm. This was not present 06/12/2023 and is therefore almost certainly benign/inflammatory. 4. Airway thickening is present, suggesting bronchitis or reactive airways disease. Mild airway plugging in the left lower lobe. 5. Infrarenal abdominal aortic aneurysm 4.2 cm in anterior-posterior dimension, previously 3.6 cm. Recommend follow-up every 12 months and vascular consultation. This recommendation follows ACR consensus guidelines: White Paper of the ACR Incidental Findings Committee II on Vascular Findings. J Am Coll Radiol 2013; 10:789-794. 6. Aortic atherosclerosis. Aortic Atherosclerosis (ICD10-I70.0). Electronically Signed   By: Gaylyn Rong M.D.   On: 07/12/2023 17:47       IMPRESSION/PLAN: 1. At  least cT1N0M0, adenocarcinoma of the distal esophagus. Dr. Mitzi Hansen discusses the pathology findings and reviews the nature of localized esophagus cancer. Dr. Cliffton Asters recommends esophagectomy following neoadjuvant chemoradiation, but the patient is not interested in surgery at this time. Dr. Mitzi Hansen recommends chemoradiation. The pathology is pending at this time for his sternal biopsy. We will follow up with this expectantly. We discussed the risks, benefits, short, and long term effects of radiotherapy, as well as the curative intent, and the patient is interested in proceeding. Dr. Mitzi Hansen discusses the delivery and logistics of radiotherapy and anticipates a course of 5 1/2 weeks of radiotherapy.  Written consent is obtained and placed in the chart, a copy was provided to the patient. He will simulate today. We anticipate starting radiation next Monday 08/14/23 if he can get a PAC placed. 2. History of colon cancer. He will continue to be followed in surveillance with Dr. Mosetta Putt and Dr. Leone Payor. 3. Dermatologic issues. The patient will try OTC hydrocortisone for his rash along the beard. If this does not improve, he will need referral to dermatology.   In a visit lasting 60 minutes, greater than 50% of the time was spent face to face discussing the patient's condition, in preparation for the discussion, and coordinating the patient's care.   The above documentation reflects my direct findings during this shared patient visit. Please see the separate note by Dr. Mitzi Hansen on this date for the remainder of the patient's plan of care.    Osker Mason, [facility]   **Disclaimer: This note was dictated with voice recognition software. Similar sounding words can inadvertently be transcribed and this note may contain transcription errors which may not have been corrected upon publication of note.**

## 2023-08-09 ENCOUNTER — Ambulatory Visit: Payer: 59 | Admitting: Radiation Oncology

## 2023-08-09 ENCOUNTER — Ambulatory Visit: Payer: 59

## 2023-08-09 LAB — SURGICAL PATHOLOGY

## 2023-08-09 LAB — AEROBIC/ANAEROBIC CULTURE W GRAM STAIN (SURGICAL/DEEP WOUND): Gram Stain: NONE SEEN

## 2023-08-10 DIAGNOSIS — Z510 Encounter for antineoplastic radiation therapy: Secondary | ICD-10-CM | POA: Diagnosis not present

## 2023-08-10 DIAGNOSIS — C155 Malignant neoplasm of lower third of esophagus: Secondary | ICD-10-CM | POA: Diagnosis not present

## 2023-08-11 ENCOUNTER — Telehealth: Payer: Self-pay | Admitting: Hematology

## 2023-08-11 ENCOUNTER — Other Ambulatory Visit: Payer: Self-pay

## 2023-08-11 ENCOUNTER — Encounter: Payer: Self-pay | Admitting: Hematology

## 2023-08-11 ENCOUNTER — Other Ambulatory Visit: Payer: Self-pay | Admitting: Hematology

## 2023-08-11 DIAGNOSIS — C155 Malignant neoplasm of lower third of esophagus: Secondary | ICD-10-CM

## 2023-08-11 MED ORDER — ONDANSETRON HCL 8 MG PO TABS
8.0000 mg | ORAL_TABLET | Freq: Three times a day (TID) | ORAL | 1 refills | Status: DC | PRN
  Filled 2023-08-11: qty 30, 10d supply, fill #0

## 2023-08-11 MED ORDER — PROCHLORPERAZINE MALEATE 10 MG PO TABS
10.0000 mg | ORAL_TABLET | Freq: Four times a day (QID) | ORAL | 1 refills | Status: DC | PRN
  Filled 2023-08-11: qty 30, 8d supply, fill #0

## 2023-08-11 NOTE — Progress Notes (Signed)
I spoke with MR Bond and updated him on his start date moving to 10/7.  I reviewed the results of his sternal biopsy.  All questions were answered.  He verbalized understanding.

## 2023-08-11 NOTE — Progress Notes (Signed)
START ON PATHWAY REGIMEN - Gastroesophageal     A cycle is every 7 days, concurrent with RT:     Paclitaxel      Carboplatin   **Always confirm dose/schedule in your pharmacy ordering system**  Patient Characteristics: Esophageal & GE Junction, Adenocarcinoma, Preoperative or Nonsurgical Candidate, M0 (Clinical Staging), cT2 or Higher or cN+, Unresectable/Nonsurgical Candidate (Any cT) Therapeutic Status: Preoperative or Nonsurgical Candidate, M0 (Clinical Staging) Histology: Adenocarcinoma Disease Classification: Esophageal AJCC Grade: G2 AJCC 8 Stage Grouping: Unknown AJCC T Category: cTX AJCC N Category: cN1 AJCC M Category: cM0 Intent of Therapy: Curative Intent, Discussed with Patient

## 2023-08-12 ENCOUNTER — Other Ambulatory Visit: Payer: Self-pay

## 2023-08-14 ENCOUNTER — Other Ambulatory Visit: Payer: Self-pay | Admitting: Hematology

## 2023-08-14 ENCOUNTER — Ambulatory Visit: Payer: 59 | Admitting: Radiation Oncology

## 2023-08-14 ENCOUNTER — Inpatient Hospital Stay: Payer: 59

## 2023-08-14 ENCOUNTER — Other Ambulatory Visit: Payer: 59

## 2023-08-14 ENCOUNTER — Other Ambulatory Visit: Payer: Self-pay

## 2023-08-14 DIAGNOSIS — I10 Essential (primary) hypertension: Secondary | ICD-10-CM | POA: Diagnosis not present

## 2023-08-14 DIAGNOSIS — Z87891 Personal history of nicotine dependence: Secondary | ICD-10-CM | POA: Diagnosis not present

## 2023-08-14 DIAGNOSIS — K219 Gastro-esophageal reflux disease without esophagitis: Secondary | ICD-10-CM | POA: Diagnosis not present

## 2023-08-14 DIAGNOSIS — Z7951 Long term (current) use of inhaled steroids: Secondary | ICD-10-CM | POA: Diagnosis not present

## 2023-08-14 DIAGNOSIS — J449 Chronic obstructive pulmonary disease, unspecified: Secondary | ICD-10-CM | POA: Diagnosis not present

## 2023-08-14 DIAGNOSIS — K449 Diaphragmatic hernia without obstruction or gangrene: Secondary | ICD-10-CM | POA: Diagnosis not present

## 2023-08-14 DIAGNOSIS — J4489 Other specified chronic obstructive pulmonary disease: Secondary | ICD-10-CM | POA: Diagnosis not present

## 2023-08-14 DIAGNOSIS — C155 Malignant neoplasm of lower third of esophagus: Secondary | ICD-10-CM | POA: Diagnosis not present

## 2023-08-14 DIAGNOSIS — Z79899 Other long term (current) drug therapy: Secondary | ICD-10-CM | POA: Diagnosis not present

## 2023-08-14 DIAGNOSIS — Z923 Personal history of irradiation: Secondary | ICD-10-CM | POA: Diagnosis not present

## 2023-08-14 DIAGNOSIS — Z9221 Personal history of antineoplastic chemotherapy: Secondary | ICD-10-CM | POA: Diagnosis not present

## 2023-08-14 LAB — CBC WITH DIFFERENTIAL (CANCER CENTER ONLY)
Abs Immature Granulocytes: 0.03 10*3/uL (ref 0.00–0.07)
Basophils Absolute: 0.1 10*3/uL (ref 0.0–0.1)
Basophils Relative: 1 %
Eosinophils Absolute: 0.2 10*3/uL (ref 0.0–0.5)
Eosinophils Relative: 3 %
HCT: 48.3 % (ref 39.0–52.0)
Hemoglobin: 16.7 g/dL (ref 13.0–17.0)
Immature Granulocytes: 0 %
Lymphocytes Relative: 17 %
Lymphs Abs: 1.2 10*3/uL (ref 0.7–4.0)
MCH: 29.9 pg (ref 26.0–34.0)
MCHC: 34.6 g/dL (ref 30.0–36.0)
MCV: 86.6 fL (ref 80.0–100.0)
Monocytes Absolute: 0.7 10*3/uL (ref 0.1–1.0)
Monocytes Relative: 9 %
Neutro Abs: 4.9 10*3/uL (ref 1.7–7.7)
Neutrophils Relative %: 70 %
Platelet Count: 249 10*3/uL (ref 150–400)
RBC: 5.58 MIL/uL (ref 4.22–5.81)
RDW: 13.6 % (ref 11.5–15.5)
WBC Count: 6.9 10*3/uL (ref 4.0–10.5)
nRBC: 0 % (ref 0.0–0.2)

## 2023-08-14 LAB — CMP (CANCER CENTER ONLY)
ALT: 17 U/L (ref 0–44)
AST: 15 U/L (ref 15–41)
Albumin: 3.9 g/dL (ref 3.5–5.0)
Alkaline Phosphatase: 83 U/L (ref 38–126)
Anion gap: 8 (ref 5–15)
BUN: 34 mg/dL — ABNORMAL HIGH (ref 8–23)
CO2: 24 mmol/L (ref 22–32)
Calcium: 9.4 mg/dL (ref 8.9–10.3)
Chloride: 104 mmol/L (ref 98–111)
Creatinine: 1.71 mg/dL — ABNORMAL HIGH (ref 0.61–1.24)
GFR, Estimated: 43 mL/min — ABNORMAL LOW (ref >60)
Glucose, Bld: 139 mg/dL — ABNORMAL HIGH (ref 70–99)
Potassium: 4.3 mmol/L (ref 3.5–5.1)
Sodium: 136 mmol/L (ref 135–145)
Total Bilirubin: 0.5 mg/dL (ref 0.3–1.2)
Total Protein: 7.1 g/dL (ref 6.5–8.1)

## 2023-08-14 MED ORDER — LIDOCAINE-PRILOCAINE 2.5-2.5 % EX CREA
1.0000 | TOPICAL_CREAM | CUTANEOUS | 0 refills | Status: DC | PRN
  Filled 2023-08-14: qty 30, 25d supply, fill #0

## 2023-08-14 MED FILL — Dexamethasone Sodium Phosphate Inj 100 MG/10ML: INTRAMUSCULAR | Qty: 1 | Status: AC

## 2023-08-15 ENCOUNTER — Other Ambulatory Visit: Payer: Self-pay

## 2023-08-15 ENCOUNTER — Inpatient Hospital Stay: Payer: 59 | Admitting: Hematology

## 2023-08-15 ENCOUNTER — Ambulatory Visit: Payer: 59

## 2023-08-15 ENCOUNTER — Inpatient Hospital Stay: Payer: 59 | Attending: Hematology

## 2023-08-15 ENCOUNTER — Encounter: Payer: Self-pay | Admitting: Hematology

## 2023-08-15 ENCOUNTER — Other Ambulatory Visit: Payer: 59

## 2023-08-15 ENCOUNTER — Ambulatory Visit
Admission: RE | Admit: 2023-08-15 | Discharge: 2023-08-15 | Disposition: A | Discharge status: Home / Self Care | Payer: 59 | Source: Ambulatory Visit | Attending: Radiation Oncology | Admitting: Radiation Oncology

## 2023-08-15 VITALS — BP 152/79 | HR 73 | Temp 98.8°F | Resp 20

## 2023-08-15 VITALS — BP 132/75 | HR 80 | Temp 97.8°F | Resp 18 | Ht 71.8 in | Wt 171.5 lb

## 2023-08-15 DIAGNOSIS — Z5111 Encounter for antineoplastic chemotherapy: Secondary | ICD-10-CM | POA: Diagnosis not present

## 2023-08-15 DIAGNOSIS — C155 Malignant neoplasm of lower third of esophagus: Secondary | ICD-10-CM | POA: Insufficient documentation

## 2023-08-15 DIAGNOSIS — R110 Nausea: Secondary | ICD-10-CM | POA: Insufficient documentation

## 2023-08-15 DIAGNOSIS — R0989 Other specified symptoms and signs involving the circulatory and respiratory systems: Secondary | ICD-10-CM | POA: Insufficient documentation

## 2023-08-15 DIAGNOSIS — Z79899 Other long term (current) drug therapy: Secondary | ICD-10-CM | POA: Insufficient documentation

## 2023-08-15 DIAGNOSIS — R079 Chest pain, unspecified: Secondary | ICD-10-CM | POA: Diagnosis not present

## 2023-08-15 DIAGNOSIS — R490 Dysphonia: Secondary | ICD-10-CM | POA: Diagnosis not present

## 2023-08-15 DIAGNOSIS — J449 Chronic obstructive pulmonary disease, unspecified: Secondary | ICD-10-CM | POA: Insufficient documentation

## 2023-08-15 DIAGNOSIS — Z7951 Long term (current) use of inhaled steroids: Secondary | ICD-10-CM | POA: Insufficient documentation

## 2023-08-15 DIAGNOSIS — Z23 Encounter for immunization: Secondary | ICD-10-CM | POA: Diagnosis not present

## 2023-08-15 DIAGNOSIS — I10 Essential (primary) hypertension: Secondary | ICD-10-CM | POA: Diagnosis not present

## 2023-08-15 DIAGNOSIS — R600 Localized edema: Secondary | ICD-10-CM | POA: Diagnosis not present

## 2023-08-15 DIAGNOSIS — M25471 Effusion, right ankle: Secondary | ICD-10-CM | POA: Diagnosis not present

## 2023-08-15 DIAGNOSIS — Z87891 Personal history of nicotine dependence: Secondary | ICD-10-CM | POA: Insufficient documentation

## 2023-08-15 DIAGNOSIS — R55 Syncope and collapse: Secondary | ICD-10-CM | POA: Diagnosis not present

## 2023-08-15 DIAGNOSIS — K219 Gastro-esophageal reflux disease without esophagitis: Secondary | ICD-10-CM | POA: Insufficient documentation

## 2023-08-15 DIAGNOSIS — Z510 Encounter for antineoplastic radiation therapy: Secondary | ICD-10-CM | POA: Insufficient documentation

## 2023-08-15 DIAGNOSIS — M25472 Effusion, left ankle: Secondary | ICD-10-CM | POA: Insufficient documentation

## 2023-08-15 DIAGNOSIS — K449 Diaphragmatic hernia without obstruction or gangrene: Secondary | ICD-10-CM | POA: Insufficient documentation

## 2023-08-15 LAB — RAD ONC ARIA SESSION SUMMARY
Course Elapsed Days: 0
Plan Fractions Treated to Date: 1
Plan Prescribed Dose Per Fraction: 1.8 Gy
Plan Total Fractions Prescribed: 25
Plan Total Prescribed Dose: 45 Gy
Reference Point Dosage Given to Date: 1.8 Gy
Reference Point Session Dosage Given: 1.8 Gy
Session Number: 1

## 2023-08-15 MED ORDER — SODIUM CHLORIDE 0.9 % IV SOLN: Freq: Once | INTRAVENOUS | Status: AC

## 2023-08-15 MED ORDER — DIPHENHYDRAMINE HCL 50 MG/ML IJ SOLN
25.0000 mg | Freq: Once | INTRAMUSCULAR | Status: AC
  Administered 2023-08-15: 25 mg via INTRAVENOUS
  Filled 2023-08-15: qty 1

## 2023-08-15 MED ORDER — SODIUM CHLORIDE 0.9 % IV SOLN
10.0000 mg | Freq: Once | INTRAVENOUS | Status: AC
  Administered 2023-08-15: 10 mg via INTRAVENOUS
  Filled 2023-08-15: qty 10

## 2023-08-15 MED ORDER — PALONOSETRON HCL INJECTION 0.25 MG/5ML
0.2500 mg | Freq: Once | INTRAVENOUS | Status: AC
  Administered 2023-08-15: 0.25 mg via INTRAVENOUS
  Filled 2023-08-15: qty 5

## 2023-08-15 MED ORDER — FAMOTIDINE IN NACL 20-0.9 MG/50ML-% IV SOLN
20.0000 mg | Freq: Once | INTRAVENOUS | Status: AC
  Administered 2023-08-15: 20 mg via INTRAVENOUS
  Filled 2023-08-15: qty 50

## 2023-08-15 MED ORDER — GUAIFENESIN-CODEINE 100-10 MG/5ML PO SOLN
5.0000 mL | Freq: Four times a day (QID) | ORAL | 0 refills | Status: DC | PRN
  Filled 2023-08-15: qty 118, 6d supply, fill #0

## 2023-08-15 MED ORDER — SODIUM CHLORIDE 0.9 % IV SOLN
50.0000 mg/m2 | Freq: Once | INTRAVENOUS | Status: AC
  Administered 2023-08-15: 102 mg via INTRAVENOUS
  Filled 2023-08-15: qty 17

## 2023-08-15 MED ORDER — SODIUM CHLORIDE 0.9 % IV SOLN
140.0000 mg | Freq: Once | INTRAVENOUS | Status: AC
  Administered 2023-08-15: 140 mg via INTRAVENOUS
  Filled 2023-08-15: qty 14

## 2023-08-15 NOTE — Assessment & Plan Note (Signed)
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  We have reviewed his case in the tumor board, and recommend biopsy of the sternal bone lesion.   -He underwent a EUS staging, which showed 2 tumors in the distal esophagus, staged as T1smN0 and T2N0.  -His sternal bone biopsy was negative for malignant cells. -We discussed the option of perioperative chemotherapy FLOT, versus concurrent chemoradiation with weekly carboplatin and paclitaxel, followed by surgery or consolidation FOLFOX and immunotherapy.  Patient is reluctant to consider surgery, and then will proceed with concurrent chemotherapy and radiation next week.

## 2023-08-15 NOTE — Patient Instructions (Signed)
[facility]  Discharge Instructions: Thank you for choosing [facility] to provide your oncology and hematology care.   If you have a lab appointment with the Cancer Center, please go directly to the Cancer Center and check in at the registration area.   Wear comfortable clothing and clothing appropriate for easy access to any Portacath or PICC line.   We strive to give you quality time with your provider. You may need to reschedule your appointment if you arrive late (15 or more minutes).  Arriving late affects you and other patients whose appointments are after yours.  Also, if you miss three or more appointments without notifying the office, you may be dismissed from the clinic at the provider's discretion.      For prescription refill requests, have your pharmacy contact our office and allow 72 hours for refills to be completed.    Today you received the following chemotherapy and/or immunotherapy agents; Paclitaxel & Carboplatin      To help prevent nausea and vomiting after your treatment, we encourage you to take your nausea medication as directed.  BELOW ARE SYMPTOMS THAT SHOULD BE REPORTED IMMEDIATELY: *FEVER GREATER THAN 100.4 F (38 C) OR HIGHER *CHILLS OR SWEATING *NAUSEA AND VOMITING THAT IS NOT CONTROLLED WITH YOUR NAUSEA MEDICATION *UNUSUAL SHORTNESS OF BREATH *UNUSUAL BRUISING OR BLEEDING *URINARY PROBLEMS (pain or burning when urinating, or frequent urination) *BOWEL PROBLEMS (unusual diarrhea, constipation, pain near the anus) TENDERNESS IN MOUTH AND THROAT WITH OR WITHOUT PRESENCE OF ULCERS (sore throat, sores in mouth, or a toothache) UNUSUAL RASH, SWELLING OR PAIN  UNUSUAL VAGINAL DISCHARGE OR ITCHING   Items with * indicate a potential emergency and should be followed up as soon as possible or go to the Emergency Department if any problems should occur.  Please show the CHEMOTHERAPY ALERT CARD or IMMUNOTHERAPY  ALERT CARD at check-in to the Emergency Department and triage nurse.  Should you have questions after your visit or need to cancel or reschedule your appointment, please contact [facility]  Dept: [phone]  and follow the prompts.  Office hours are 8:00 a.m. to 4:30 p.m. Monday - Friday. Please note that voicemails left after 4:00 p.m. may not be returned until the following business day.  We are closed weekends and major holidays. You have access to a nurse at all times for urgent questions. Please call the main number to the clinic Dept: [phone] and follow the prompts.   For any non-urgent questions, you may also contact your provider using MyChart. We now offer e-Visits for anyone 18 and older to request care online for non-urgent symptoms. For details visit [email or web address].   Also download the MyChart app! Go to the app store, search "MyChart", open the app, select [facility], and log in with your MyChart username and password.  

## 2023-08-15 NOTE — Progress Notes (Signed)
[facility]   [phone] [phone]   Clinic Follow up Note   Patient Care Team: Storm Frisk, MD as PCP - General (Pulmonary Disease) Donzetta Starch, MD as Consulting Physician (Dermatology) Veryl Speak, FNP as Nurse Practitioner (Infectious Diseases) Forestine Na (Physician Assistant) Bradly Bienenstock, MD as Consulting Physician (Orthopedic Surgery) Malachy Mood, MD as Consulting Physician (Hematology and Oncology)  Date of Service:  08/15/2023  CHIEF COMPLAINT: f/u of esophageal cancer   CURRENT THERAPY:  Concurrent chemoRT   Oncology History   Esophageal cancer [facility]) Distal esophageal carcinoma, 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  We have reviewed his case in the tumor board, and recommend biopsy of the sternal bone lesion.   -He underwent a EUS staging, which showed 2 tumors in the distal esophagus, staged as T1smN0 and T2N0.  -His sternal bone biopsy was negative for malignant cells. -We discussed the option of perioperative chemotherapy FLOT, versus concurrent chemoradiation with weekly carboplatin and paclitaxel, followed by surgery or consolidation FOLFOX and immunotherapy.  Patient is reluctant to consider surgery, and then will proceed with concurrent chemotherapy and radiation, he started on 08/15/2023      Assessment and Plan    Esophageal Cancer Patient is scheduled to start chemotherapy and radiation treatment. No new symptoms reported. -Start chemotherapy and radiation treatment today.  I again reviewed the potential side effect from chemotherapy and what to watch.  He voiced good  understanding. -Continue weekly chemotherapy and daily radiation (Monday through Friday).  Chest Pain Patient reports severe pain when coughing, likely related to chronic cough and not the benign bone lesion. -Prescribe guaifenesin-codeine for cough. -Advise use of Tylenol for pain management.  Chronic Obstructive Pulmonary Disease (COPD) Patient reports increased shortness of breath and chronic cough. -Refer to pulmonologist for further management of COPD.  Dental Health Patient reports loose teeth, possibly related to chemotherapy. -Advise patient to find a community dentist for evaluation and potential denture fitting.  General Health Maintenance -Check stent for blockage before resuming chemotherapy next week. -Pick up prescribed nausea medication from pharmacy.     Plan -will start chemoradiation today -Lab follow-up and chemotherapy every week    SUMMARY OF ONCOLOGIC HISTORY: Oncology History  Esophageal cancer (HCC)  07/13/2023 Initial Diagnosis   Esophageal cancer (HCC)   08/15/2023 -  Chemotherapy   Patient is on Treatment Plan : ESOPHAGUS Carboplatin + Paclitaxel Weekly X 6 Weeks with XRT        Discussed the use of AI scribe software for clinical note transcription with the patient, who gave verbal consent to proceed.  History of Present Illness   The patient, with a history of esophageal cancer and COPD, presents with ongoing chest pain, exacerbated by coughing. The pain is located in the chest area and is described as severe enough to cause near fainting during coughing episodes. The patient reports that this pain has been present since the onset of his current health issues.  The patient's coughing is chronic and is associated with the production of gray phlegm. The patient reports that the coughing and associated pain are not relieved by the current cough syrup (Delsom) prescribed by his primary care physician. The  patient also reports increased shortness of  breath, particularly when moving around or talking.  The patient has a history of smoking but quit approximately two to three years ago. He also reports sleeping with multiple pillows and occasionally in a reclined position to alleviate symptoms.  In addition to these respiratory symptoms, the patient also reports dental issues. His teeth have become loose, and he is seeking a dentist to address this issue. He has dental insurance through [employer] but has had difficulty finding a Education officer, community.         All other systems were reviewed with the patient and are negative.  MEDICAL HISTORY:  Past Medical History:  Diagnosis Date   Allergy    seasonal   Aneurysm of infrarenal abdominal aorta (HCC)    4.2 cm 07/12/23   Arthritis    Colon cancer (HCC)    Compression fracture of thoracic vertebra with routine healing 04/30/2020   COPD (chronic obstructive pulmonary disease) (HCC)    Family history of adverse reaction to anesthesia    mother slow to awaken   GERD (gastroesophageal reflux disease)    Hepatitis    hepatitis c dx in remission per pt   Hypertension    Inguinal hernia    Multiple fractures 06/04/2020   Positive colorectal cancer screening using Cologuard test 08/27/2020   Substance abuse (HCC)    Wears glasses    for reading    SURGICAL HISTORY: Past Surgical History:  Procedure Laterality Date   COLONOSCOPY  11/11/2020   ENDOSCOPIC MUCOSAL RESECTION N/A 03/10/2021   Procedure: ENDOSCOPIC MUCOSAL RESECTION;  Surgeon: Lemar Lofty., MD;  Location: [facility];  Service: Gastroenterology;  Laterality: N/A;   ESOPHAGOGASTRODUODENOSCOPY (EGD) WITH PROPOFOL N/A 07/27/2023   Procedure: ESOPHAGOGASTRODUODENOSCOPY (EGD) WITH PROPOFOL;  Surgeon: Meridee Score Netty Starring., MD;  Location: [facility];  Service: Gastroenterology;  Laterality: N/A;   EUS N/A 07/27/2023   Procedure: UPPER ENDOSCOPIC ULTRASOUND (EUS) RADIAL;  Surgeon: Lemar Lofty., MD;  Location:  [facility];  Service: Gastroenterology;  Laterality: N/A;   FLEXIBLE SIGMOIDOSCOPY N/A 03/10/2021   Procedure: FLEXIBLE SIGMOIDOSCOPY;  Surgeon: Meridee Score Netty Starring., MD;  Location: [facility];  Service: Gastroenterology;  Laterality: N/A;   HEMOSTASIS CLIP PLACEMENT  03/10/2021   Procedure: HEMOSTASIS CLIP PLACEMENT;  Surgeon: Lemar Lofty., MD;  Location: [facility];  Service: Gastroenterology;;   INGUINAL HERNIA REPAIR Right 01/19/2021   Procedure: LAPAROSCOPIC RIGHT INGUINAL HERNIA REPAIR WITH MESH;  Surgeon: Stechschulte, Hyman Hopes, MD;  Location: [facility];  Service: General;  Laterality: Right;   obstruction removed as a child   swallowed a peanut   POLYPECTOMY  03/10/2021   Procedure: POLYPECTOMY;  Surgeon: Mansouraty, Netty Starring., MD;  Location: [facility];  Service: Gastroenterology;;   POLYPECTOMY     SUBMUCOSAL LIFTING INJECTION  03/10/2021   Procedure: SUBMUCOSAL LIFTING INJECTION;  Surgeon: Lemar Lofty., MD;  Location: [facility];  Service: Gastroenterology;;    I have reviewed the social history and family history with the patient and they are unchanged from previous note.  ALLERGIES:  has No Known Allergies.  MEDICATIONS:  Current Outpatient Medications  Medication Sig Dispense Refill   guaiFENesin-codeine 100-10 MG/5ML syrup Take 5 mLs by mouth every 6 (six) hours as needed for cough. 118 mL 0   albuterol (VENTOLIN HFA) 108 (90 Base) MCG/ACT inhaler Inhale 2 puffs into the lungs every 6 (six) hours as needed for wheezing or shortness of breath. 18 g 2   amLODipine (NORVASC)  10 MG tablet Take 1 tablet (10 mg total) by mouth daily. 90 tablet 2   fluticasone-salmeterol (ADVAIR HFA) 115-21 MCG/ACT inhaler Inhale 2 puffs into the lungs 2 (two) times daily. 12 g 12   lidocaine-prilocaine (EMLA) cream Apply 1 Application topically as needed. 30 g 0   Multiple Vitamin (MULTIVITAMIN ADULT PO) Take by mouth.     omeprazole (PRILOSEC)  40 MG capsule Take 1 capsule (40 mg total) by mouth daily before dinner. 60 capsule 3   ondansetron (ZOFRAN) 8 MG tablet Take 1 tablet (8 mg total) by mouth every 8 (eight) hours as needed for nausea or vomiting. Start on the third day after chemotherapy. 30 tablet 1   prochlorperazine (COMPAZINE) 10 MG tablet Take 1 tablet (10 mg total) by mouth every 6 (six) hours as needed for nausea or vomiting. 30 tablet 1   promethazine-dextromethorphan (PROMETHAZINE-DM) 6.25-15 MG/5ML syrup Take 5 mLs by mouth 4 (four) times daily as needed for cough. 240 mL 1   sildenafil (VIAGRA) 100 MG tablet Take 1 tablet (100 mg total) by mouth daily as needed for erectile dysfunction. 10 tablet 3   No current facility-administered medications for this visit.   Facility-Administered Medications Ordered in Other Visits  Medication Dose Route Frequency Provider Last Rate Last Admin   CARBOplatin (PARAPLATIN) 140 mg in sodium chloride 0.9 % 100 mL chemo infusion  140 mg Intravenous Once Malachy Mood, MD 228 mL/hr at 08/15/23 1715 140 mg at 08/15/23 1715    PHYSICAL EXAMINATION: ECOG PERFORMANCE STATUS: 1 - Symptomatic but completely ambulatory  Vitals:   08/15/23 1207  BP: 132/75  Pulse: 80  Resp: 18  Temp: 97.8 F (36.6 C)  SpO2: 97%   Wt Readings from Last 3 Encounters:  08/15/23 171 lb 8 oz (77.8 kg)  08/08/23 171 lb 6 oz (77.7 kg)  08/04/23 173 lb (78.5 kg)     GENERAL:alert, no distress and comfortable SKIN: skin color, texture, turgor are normal, no rashes or significant lesions EYES: normal, Conjunctiva are pink and non-injected, sclera clear NECK: supple, thyroid normal size, non-tender, without nodularity LYMPH:  no palpable lymphadenopathy in the cervical, axillary  LUNGS: clear to auscultation and percussion with normal breathing effort HEART: regular rate & rhythm and no murmurs and no lower extremity edema ABDOMEN:abdomen soft, non-tender and normal bowel sounds Musculoskeletal:no cyanosis of  digits and no clubbing  NEURO: alert & oriented x 3 with fluent speech, no focal motor/sensory deficits   LABORATORY DATA:  I have reviewed the data as listed    Latest Ref Rng & Units 08/14/2023    3:26 PM 08/04/2023    8:29 AM 07/31/2023    2:34 PM  CBC  WBC 4.0 - 10.5 K/uL 6.9  5.8  6.5   Hemoglobin 13.0 - 17.0 g/dL 16.1  09.6  04.5   Hematocrit 39.0 - 52.0 % 48.3  47.8  50.5   Platelets 150 - 400 K/uL 249  227  258         Latest Ref Rng & Units 08/14/2023    3:26 PM 07/31/2023    2:34 PM 07/12/2023    4:47 PM  CMP  Glucose 70 - 99 mg/dL 409  811    BUN 8 - 23 mg/dL 34  32    Creatinine 9.14 - 1.24 mg/dL 7.82  9.56  2.13   Sodium 135 - 145 mmol/L 136  138    Potassium 3.5 - 5.1 mmol/L 4.3  4.9    Chloride  98 - 111 mmol/L 104  104    CO2 22 - 32 mmol/L 24  29    Calcium 8.9 - 10.3 mg/dL 9.4  9.5    Total Protein 6.5 - 8.1 g/dL 7.1  7.2    Total Bilirubin 0.3 - 1.2 mg/dL 0.5  0.4    Alkaline Phos 38 - 126 U/L 83  87    AST 15 - 41 U/L 15  13    ALT 0 - 44 U/L 17  12        RADIOGRAPHIC STUDIES: I have personally reviewed the radiological images as listed and agreed with the findings in the report. No results found.    No orders of the defined types were placed in this encounter.  All questions were answered. The patient knows to call the clinic with any problems, questions or concerns. No barriers to learning was detected. The total time spent in the appointment was 30 minutes.     Malachy Mood, MD 08/15/2023

## 2023-08-15 NOTE — Progress Notes (Signed)
Ok to treat with elevated Scr 1.71 today per Dr Mosetta Putt.

## 2023-08-16 ENCOUNTER — Other Ambulatory Visit: Payer: Self-pay

## 2023-08-16 ENCOUNTER — Encounter: Payer: Self-pay | Admitting: Hematology

## 2023-08-16 ENCOUNTER — Ambulatory Visit: Payer: 59

## 2023-08-16 ENCOUNTER — Ambulatory Visit
Admission: RE | Admit: 2023-08-16 | Discharge: 2023-08-16 | Discharge status: Home / Self Care | Payer: 59 | Source: Ambulatory Visit | Attending: Radiation Oncology

## 2023-08-16 ENCOUNTER — Other Ambulatory Visit: Payer: Self-pay | Admitting: Radiology

## 2023-08-16 DIAGNOSIS — Z79899 Other long term (current) drug therapy: Secondary | ICD-10-CM | POA: Diagnosis not present

## 2023-08-16 DIAGNOSIS — Z7951 Long term (current) use of inhaled steroids: Secondary | ICD-10-CM | POA: Diagnosis not present

## 2023-08-16 DIAGNOSIS — I10 Essential (primary) hypertension: Secondary | ICD-10-CM | POA: Diagnosis not present

## 2023-08-16 DIAGNOSIS — Z5111 Encounter for antineoplastic chemotherapy: Secondary | ICD-10-CM | POA: Diagnosis not present

## 2023-08-16 DIAGNOSIS — R55 Syncope and collapse: Secondary | ICD-10-CM | POA: Diagnosis not present

## 2023-08-16 DIAGNOSIS — Z510 Encounter for antineoplastic radiation therapy: Secondary | ICD-10-CM | POA: Diagnosis not present

## 2023-08-16 DIAGNOSIS — R0989 Other specified symptoms and signs involving the circulatory and respiratory systems: Secondary | ICD-10-CM | POA: Diagnosis not present

## 2023-08-16 DIAGNOSIS — R600 Localized edema: Secondary | ICD-10-CM | POA: Diagnosis not present

## 2023-08-16 DIAGNOSIS — Z87891 Personal history of nicotine dependence: Secondary | ICD-10-CM | POA: Diagnosis not present

## 2023-08-16 DIAGNOSIS — M25472 Effusion, left ankle: Secondary | ICD-10-CM | POA: Diagnosis not present

## 2023-08-16 DIAGNOSIS — M25471 Effusion, right ankle: Secondary | ICD-10-CM | POA: Diagnosis not present

## 2023-08-16 DIAGNOSIS — K219 Gastro-esophageal reflux disease without esophagitis: Secondary | ICD-10-CM | POA: Diagnosis not present

## 2023-08-16 DIAGNOSIS — R490 Dysphonia: Secondary | ICD-10-CM | POA: Diagnosis not present

## 2023-08-16 DIAGNOSIS — J449 Chronic obstructive pulmonary disease, unspecified: Secondary | ICD-10-CM | POA: Diagnosis not present

## 2023-08-16 DIAGNOSIS — R110 Nausea: Secondary | ICD-10-CM | POA: Diagnosis not present

## 2023-08-16 DIAGNOSIS — C155 Malignant neoplasm of lower third of esophagus: Secondary | ICD-10-CM | POA: Diagnosis not present

## 2023-08-16 DIAGNOSIS — R079 Chest pain, unspecified: Secondary | ICD-10-CM | POA: Diagnosis not present

## 2023-08-16 DIAGNOSIS — Z23 Encounter for immunization: Secondary | ICD-10-CM | POA: Diagnosis not present

## 2023-08-16 DIAGNOSIS — K449 Diaphragmatic hernia without obstruction or gangrene: Secondary | ICD-10-CM | POA: Diagnosis not present

## 2023-08-16 LAB — RAD ONC ARIA SESSION SUMMARY
Course Elapsed Days: 1
Plan Fractions Treated to Date: 2
Plan Prescribed Dose Per Fraction: 1.8 Gy
Plan Total Fractions Prescribed: 25
Plan Total Prescribed Dose: 45 Gy
Reference Point Dosage Given to Date: 3.6 Gy
Reference Point Session Dosage Given: 1.8 Gy
Session Number: 2

## 2023-08-16 NOTE — Telephone Encounter (Signed)
-----   Message from Nurse Reece Levy sent at 08/16/2023  4:26 PM EDT ----- Regarding: Dr. Mosetta Putt 1st time Taxol/Carbo f/u Dr. Mosetta Putt 1st time Taxol Carbo, Pt call back completed, Pt tolerated tx well.

## 2023-08-16 NOTE — Telephone Encounter (Signed)
Called pt to see how he did with his treatment yest.  He just finished with radiation treatment for today & reports doing well.  He denies any problems & states he slept well last night after taking compazine & hasn't needed any more.  He knows his appts & how to reach [place] if needed.

## 2023-08-17 ENCOUNTER — Other Ambulatory Visit: Payer: Self-pay

## 2023-08-17 ENCOUNTER — Ambulatory Visit: Payer: 59

## 2023-08-17 ENCOUNTER — Ambulatory Visit
Admission: RE | Admit: 2023-08-17 | Discharge: 2023-08-17 | Disposition: A | Discharge status: Home / Self Care | Payer: 59 | Source: Ambulatory Visit | Attending: Radiation Oncology | Admitting: Radiation Oncology

## 2023-08-17 ENCOUNTER — Encounter (HOSPITAL_COMMUNITY): Payer: Self-pay

## 2023-08-17 ENCOUNTER — Ambulatory Visit (HOSPITAL_COMMUNITY)
Admission: RE | Admit: 2023-08-17 | Discharge: 2023-08-17 | Disposition: A | Discharge status: Home / Self Care | Payer: 59 | Source: Ambulatory Visit | Attending: Hematology | Admitting: Hematology

## 2023-08-17 DIAGNOSIS — Z87891 Personal history of nicotine dependence: Secondary | ICD-10-CM | POA: Diagnosis not present

## 2023-08-17 DIAGNOSIS — C155 Malignant neoplasm of lower third of esophagus: Secondary | ICD-10-CM | POA: Diagnosis not present

## 2023-08-17 DIAGNOSIS — Z510 Encounter for antineoplastic radiation therapy: Secondary | ICD-10-CM | POA: Diagnosis not present

## 2023-08-17 DIAGNOSIS — C159 Malignant neoplasm of esophagus, unspecified: Secondary | ICD-10-CM | POA: Diagnosis not present

## 2023-08-17 HISTORY — PX: IR IMAGING GUIDED PORT INSERTION: IMG5740

## 2023-08-17 LAB — RAD ONC ARIA SESSION SUMMARY
Course Elapsed Days: 2
Plan Fractions Treated to Date: 3
Plan Prescribed Dose Per Fraction: 1.8 Gy
Plan Total Fractions Prescribed: 25
Plan Total Prescribed Dose: 45 Gy
Reference Point Dosage Given to Date: 5.4 Gy
Reference Point Session Dosage Given: 1.8 Gy
Session Number: 3

## 2023-08-17 MED ORDER — FENTANYL CITRATE (PF) 100 MCG/2ML IJ SOLN
INTRAMUSCULAR | Status: AC | PRN
  Administered 2023-08-17: 25 ug via INTRAVENOUS
  Administered 2023-08-17: 50 ug via INTRAVENOUS

## 2023-08-17 MED ORDER — LIDOCAINE-EPINEPHRINE 1 %-1:100000 IJ SOLN
INTRAMUSCULAR | Status: AC
  Filled 2023-08-17: qty 1

## 2023-08-17 MED ORDER — MIDAZOLAM HCL 2 MG/2ML IJ SOLN
INTRAMUSCULAR | Status: AC | PRN
  Administered 2023-08-17: 1 mg via INTRAVENOUS
  Administered 2023-08-17: .5 mg via INTRAVENOUS

## 2023-08-17 MED ORDER — MIDAZOLAM HCL 2 MG/2ML IJ SOLN
INTRAMUSCULAR | Status: AC
  Filled 2023-08-17: qty 2

## 2023-08-17 MED ORDER — HEPARIN SOD (PORK) LOCK FLUSH 100 UNIT/ML IV SOLN: 500.0000 [IU] | Freq: Once | INTRAVENOUS | Status: DC

## 2023-08-17 MED ORDER — SODIUM CHLORIDE 0.9 % IV SOLN: INTRAVENOUS | Status: DC

## 2023-08-17 MED ORDER — HEPARIN SOD (PORK) LOCK FLUSH 100 UNIT/ML IV SOLN
INTRAVENOUS | Status: AC
  Filled 2023-08-17: qty 5

## 2023-08-17 MED ORDER — FENTANYL CITRATE (PF) 100 MCG/2ML IJ SOLN
INTRAMUSCULAR | Status: AC
  Filled 2023-08-17: qty 2

## 2023-08-17 NOTE — Progress Notes (Signed)
Patient and mother was given discharge instructions. Both verbalized understanding. 

## 2023-08-17 NOTE — H&P (Signed)
Chief Complaint: Patient was seen in consultation today for [facility] a cath placement at the request of Feng,Yan  Referring Physician(s): Feng,Yan  Supervising Physician: Roanna Banning  Patient Status: [facility] - Out-pt  History of Present Illness: Jordan [name] is a 67 y.o. male   FULL Code status per pt Newly diagnosed esophageal cancer Minimal wt loss No dysphagia ++smoker Started chemotherapy last week  Request for [facility] per Dr Mosetta Putt Next chemotherapy 10/8  Past Medical History:  Diagnosis Date   Allergy    seasonal   Aneurysm of infrarenal abdominal aorta (HCC)    4.2 cm 07/12/23   Arthritis    Colon cancer (HCC)    Compression fracture of thoracic vertebra with routine healing 04/30/2020   COPD (chronic obstructive pulmonary disease) (HCC)    Family history of adverse reaction to anesthesia    mother slow to awaken   GERD (gastroesophageal reflux disease)    Hepatitis    hepatitis c dx in remission per pt   Hypertension    Inguinal hernia    Multiple fractures 06/04/2020   Positive colorectal cancer screening using Cologuard test 08/27/2020   Substance abuse (HCC)    Wears glasses    for reading    Past Surgical History:  Procedure Laterality Date   COLONOSCOPY  11/11/2020   ENDOSCOPIC MUCOSAL RESECTION N/A 03/10/2021   Procedure: ENDOSCOPIC MUCOSAL RESECTION;  Surgeon: Lemar Lofty., MD;  Location: [facility];  Service: Gastroenterology;  Laterality: N/A;   ESOPHAGOGASTRODUODENOSCOPY (EGD) WITH PROPOFOL N/A 07/27/2023   Procedure: ESOPHAGOGASTRODUODENOSCOPY (EGD) WITH PROPOFOL;  Surgeon: Meridee Score Netty Starring., MD;  Location: [facility];  Service: Gastroenterology;  Laterality: N/A;   EUS N/A 07/27/2023   Procedure: UPPER ENDOSCOPIC ULTRASOUND (EUS) RADIAL;  Surgeon: Lemar Lofty., MD;  Location: [facility];  Service: Gastroenterology;  Laterality: N/A;   FLEXIBLE SIGMOIDOSCOPY N/A 03/10/2021   Procedure: FLEXIBLE SIGMOIDOSCOPY;   Surgeon: Meridee Score Netty Starring., MD;  Location: [facility];  Service: Gastroenterology;  Laterality: N/A;   HEMOSTASIS CLIP PLACEMENT  03/10/2021   Procedure: HEMOSTASIS CLIP PLACEMENT;  Surgeon: Lemar Lofty., MD;  Location: [facility];  Service: Gastroenterology;;   INGUINAL HERNIA REPAIR Right 01/19/2021   Procedure: LAPAROSCOPIC RIGHT INGUINAL HERNIA REPAIR WITH MESH;  Surgeon: Stechschulte, Hyman Hopes, MD;  Location: [facility] [place];  Service: General;  Laterality: Right;   obstruction removed as a child   swallowed a peanut   POLYPECTOMY  03/10/2021   Procedure: POLYPECTOMY;  Surgeon: Mansouraty, Netty Starring., MD;  Location: [facility];  Service: Gastroenterology;;   POLYPECTOMY     SUBMUCOSAL LIFTING INJECTION  03/10/2021   Procedure: SUBMUCOSAL LIFTING INJECTION;  Surgeon: Lemar Lofty., MD;  Location: [facility];  Service: Gastroenterology;;    Allergies: Patient has no known allergies.  Medications: Prior to Admission medications   Medication Sig Start Date End Date Taking? Authorizing Provider  albuterol (VENTOLIN HFA) 108 (90 Base) MCG/ACT inhaler Inhale 2 puffs into the lungs every 6 (six) hours as needed for wheezing or shortness of breath. 07/04/23  Yes Storm Frisk, MD  amLODipine (NORVASC) 10 MG tablet Take 1 tablet (10 mg total) by mouth daily. 07/04/23  Yes Storm Frisk, MD  fluticasone-salmeterol (ADVAIR HFA) [phone] MCG/ACT inhaler Inhale 2 puffs into the lungs 2 (two) times daily. 05/16/23  Yes Storm Frisk, MD  guaiFENesin-codeine 100-10 MG/5ML syrup Take 5 mLs by mouth every 6 (six) hours as needed for cough. 08/15/23  Yes Malachy Mood, MD  Multiple Vitamin (MULTIVITAMIN ADULT PO) Take by mouth.   Yes [provider]  omeprazole (PRILOSEC) 40 MG capsule Take 1 capsule (40 mg total) by mouth daily before dinner. 07/04/23  Yes Storm Frisk, MD  ondansetron (ZOFRAN) 8 MG tablet Take 1 tablet (8 mg total) by mouth  every 8 (eight) hours as needed for nausea or vomiting. Start on the third day after chemotherapy. 08/11/23  Yes Malachy Mood, MD  sildenafil (VIAGRA) 100 MG tablet Take 1 tablet (100 mg total) by mouth daily as needed for erectile dysfunction. 10/11/22 10/11/23 Yes Storm Frisk, MD  lidocaine-prilocaine (EMLA) cream Apply 1 Application topically as needed. 08/14/23   Malachy Mood, MD  prochlorperazine (COMPAZINE) 10 MG tablet Take 1 tablet (10 mg total) by mouth every 6 (six) hours as needed for nausea or vomiting. 08/11/23   Malachy Mood, MD  promethazine-dextromethorphan (PROMETHAZINE-DM) 6.25-15 MG/5ML syrup Take 5 mLs by mouth 4 (four) times daily as needed for cough. 07/04/23   Storm Frisk, MD     Family History  Problem Relation Age of Onset   Colon polyps Father    Stomach cancer Father 97   Cancer Sister        Small cell lung cancer   Colon polyps Sister    Cancer Sister        pancreatic cancer   Colon polyps Brother    Colon cancer Neg Hx    Esophageal cancer Neg Hx    Rectal cancer Neg Hx    Inflammatory bowel disease Neg Hx    Liver disease Neg Hx    Pancreatic cancer Neg Hx     Social History   Socioeconomic History   Marital status: Divorced    Spouse name: Not on file   Number of children: 0   Years of education: Not on file   Highest education level: Not on file  Occupational History   Not on file  Tobacco Use   Smoking status: Former    Types: Cigarettes    Start date: 2022    Quit date: 1975    Years since quitting: 49.7   Smokeless tobacco: Never  Vaping Use   Vaping status: Never Used  Substance and Sexual Activity   Alcohol use: Not Currently    Alcohol/week: 1.0 standard drink of alcohol    Types: 1 Cans of beer per week    Comment: quit 2000   Drug use: Yes    Frequency: 1.0 times per week    Types: Cocaine    Comment: last use Wednesday 06/28/23   Sexual activity: Not on file  Other Topics Concern   Not on file  Social History Narrative    Not on file   Social Determinants of Health   Financial Resource Strain: Low Risk  (05/31/2023)   Overall Financial Resource Strain (CARDIA)    Difficulty of Paying Living Expenses: Not hard at all  Food Insecurity: No Food Insecurity (08/08/2023)   Hunger Vital Sign    Worried About Running Out of Food in the Last Year: Never true    Ran Out of Food in the Last Year: Never true  Transportation Needs: No Transportation Needs (08/08/2023)   PRAPARE - Administrator, Civil Service (Medical): No    Lack of Transportation (Non-Medical): No  Physical Activity: Insufficiently Active (05/31/2023)   Exercise Vital Sign    Days of Exercise per Week: 3 days    Minutes of Exercise per Session: 30  min  Stress: No Stress Concern Present (05/31/2023)   [employer] of Occupational Health - Occupational Stress Questionnaire    Feeling of Stress : Not at all  Social Connections: Socially Isolated (05/31/2023)   Social Connection and Isolation Panel [NHANES]    Frequency of Communication with Friends and Family: More than three times a week    Frequency of Social Gatherings with Friends and Family: Twice a week    Attends Religious Services: Never    Database administrator or Organizations: No    Attends Engineer, structural: Never    Marital Status: Divorced     Review of Systems: A 12 point ROS discussed and pertinent positives are indicated in the HPI above.  All other systems are negative.  Review of Systems  Constitutional:  Positive for fatigue and unexpected weight change. Negative for activity change.  HENT:  Negative for sore throat and trouble swallowing.   Respiratory:  Positive for cough and shortness of breath.   Cardiovascular:  Negative for chest pain.  Psychiatric/Behavioral:  Negative for behavioral problems and confusion.     Vital Signs: BP 138/83 (BP Location: Right Arm)   Pulse 68   Temp 98.7 F (37.1 C) (Oral)   Resp 20   Ht 5\' 11"  (1.803 m)    Wt 172 lb (78 kg)   SpO2 93%   BMI 23.99 kg/m   Advance Care Plan: The advanced care plan/surrogate decision maker was discussed at the time of visit and documented in the medical record.    Physical Exam Vitals reviewed.  HENT:     Mouth/Throat:     Mouth: Mucous membranes are moist.  Cardiovascular:     Rate and Rhythm: Normal rate and regular rhythm.     Heart sounds: Normal heart sounds.  Pulmonary:     Effort: Pulmonary effort is normal.     Breath sounds: Rhonchi present.  Abdominal:     Palpations: Abdomen is soft.  Musculoskeletal:        General: Normal range of motion.  Skin:    General: Skin is warm.  Neurological:     Mental Status: He is alert and oriented to person, place, and time.  Psychiatric:        Behavior: Behavior normal.     Imaging: CT Biopsy  Result Date: 08/04/2023 CLINICAL DATA:  Destructive sternal lesion. History of esophageal carcinoma. EXAM: CT GUIDED CORE BONE BIOPSY OF STERNAL LESION ANESTHESIA/SEDATION: Intravenous Fentanyl and Versed 1mg  were administered as conscious sedation during continuous monitoring of the patient's level of consciousness and physiological / cardiorespiratory status by the radiology RN, with a total moderate sedation time of 20 minutes. PROCEDURE: The procedure risks, benefits, and alternatives were explained to the patient. Questions regarding the procedure were encouraged and answered. The patient understands and consents to the procedure. Select axial scans through the abdomen were obtained. The lesion was localized and an appropriate skin site was determined and marked. The operative field was prepped with chlorhexidinein a sterile fashion, and a sterile drape was applied covering the operative field. A sterile gown and sterile gloves were used for the procedure. Local anesthesia was provided with 1% Lidocaine. Under CT fluoroscopic guidance, an 11 gauge "bone biopsy needle was advanced to the margin of the  lesion. Coaxial core samples were obtained. A final core samples through the guide needle itself was obtained. Sample sent for surgical pathology, Gram stain and culture. Postprocedure scans show no hemorrhage or other apparent  complication. The patient tolerated the procedure well. COMPLICATIONS: None immediate FINDINGS: Lytic destructive mid sternal lesion was localized. Representative core bone biopsy samples were obtained as above, sent for surgical path and culture. IMPRESSION: 1. Technically successful CT-guided sternum core biopsy. RADIATION DOSE REDUCTION: This exam was performed according to the departmental dose-optimization program which includes automated exposure control, adjustment of the mA and/or kV according to patient size and/or use of iterative reconstruction technique. Electronically Signed   By: Corlis Leak M.D.   On: 08/04/2023 16:18   NM PET Image Initial (PI) Skull Base To Thigh  Result Date: 07/24/2023 CLINICAL DATA:  Initial treatment strategy for lower esophageal cancer. EXAM: NUCLEAR MEDICINE PET SKULL BASE TO THIGH TECHNIQUE: 8.6 mCi F-18 FDG was injected intravenously. Full-ring PET imaging was performed from the skull base to thigh after the radiotracer. CT data was obtained and used for attenuation correction and anatomic localization. Fasting blood glucose: 105 mg/dl COMPARISON:  CT chest abdomen pelvis dated 07/12/2023 FINDINGS: Mediastinal blood pool activity: SUV max 2.3 Liver activity: SUV max NA NECK: No hypermetabolic cervical lymphadenopathy. Incidental CT findings: None. CHEST: Wall thickening/mass involving the distal esophagus/GE junction (series 4/image 90), max SUV 24.4, corresponding to the patient's known primary esophageal cancer. No suspicious pulmonary nodules. No hypermetabolic thoracic lymphadenopathy. Incidental CT findings: Atherosclerotic calcifications of the aortic arch. Mild three-vessel coronary atherosclerosis. ABDOMEN/PELVIS: No abnormal hypermetabolism  in the liver, spleen, pancreas, or adrenal glands. No hypermetabolic abdominopelvic lymphadenopathy. Incidental CT findings: 4.2 cm infrarenal aortic aneurysm (series 4/image 127). Atherosclerotic calcifications abdominal aorta and branch vessels. Mild sigmoid diverticulosis, without evidence of diverticulitis. Small fat containing left inguinal hernia. Postsurgical changes related to prior right inguinal hernia repair. SKELETON: Sclerosis/destructive changes involving the lower sternum, max SUV 4.4. This appearance continues to favor infection/osteomyelitis No focal hypermetabolic activity suggestive of skeletal metastasis. Incidental CT findings: Old bilateral posterior rib fracture deformities. Old left scapular fracture deformity. IMPRESSION: Distal esophageal/GE junction mass, corresponding to the patient's known primary esophageal cancer. No evidence of metastatic disease. Sclerosis/destructive changes involving the lower sternum, favoring infection/osteomyelitis. 4.2 cm infrarenal abdominal aortic aneurysm. Recommend follow-up every 12 months and vascular consultation. This recommendation follows ACR consensus guidelines: White Paper of the ACR Incidental Findings Committee II on Vascular Findings. J Am Coll Radiol 2013; 10:789-794. Electronically Signed   By: Charline Bills M.D.   On: 07/24/2023 16:43    Labs:  CBC: Recent Labs    06/30/23 1030 07/31/23 1434 08/04/23 0829 08/14/23 1526  WBC 5.9 6.5 5.8 6.9  HGB 15.4 17.1* 15.9 16.7  HCT 46.8 50.5 47.8 48.3  PLT 271.0 258 227 249    COAGS: Recent Labs    08/04/23 0829  INR 1.0    BMP: Recent Labs    05/16/23 1416 06/30/23 1030 07/12/23 1647 07/31/23 1434 08/14/23 1526  NA 141 132*  --  138 136  K 4.3 4.1  --  4.9 4.3  CL 105 99  --  104 104  CO2 21 25  --  29 24  GLUCOSE 133* 102*  --  122* 139*  BUN 30* 31*  --  32* 34*  CALCIUM 8.9 8.8  --  9.5 9.4  CREATININE 1.67* 1.86* 1.60* 1.68* 1.71*  GFRNONAA  --   --   --   44* 43*    LIVER FUNCTION TESTS: Recent Labs    05/16/23 1416 06/30/23 1030 07/31/23 1434 08/14/23 1526  BILITOT <0.2 0.5 0.4 0.5  AST 13 14 13* 15  ALT  13 12 12 17   ALKPHOS 96 84 87 83  PROT 6.5 6.7 7.2 7.1  ALBUMIN 4.0 3.9 4.0 3.9    TUMOR MARKERS: Recent Labs    06/30/23 1030  CEA 6.5*    Assessment and Plan:  Scheduled for Port a cath placement today Risks and benefits of image guided port-a-catheter placement was discussed with the patient including, but not limited to bleeding, infection, pneumothorax, or fibrin sheath development and need for additional procedures.  All of the patient's questions were answered, patient is agreeable to proceed. Consent signed and in chart.   Thank you for this interesting consult.  I greatly enjoyed meeting Jordan [name] and look forward to participating in their care.  A copy of this report was sent to the requesting provider on this date.  Electronically Signed: Robet Leu, PA-C 08/17/2023, 10:16 AM   I spent a total of  30 Minutes   in face to face in clinical consultation, greater than 50% of which was counseling/coordinating care for [facility] a cath placement

## 2023-08-17 NOTE — Procedures (Signed)
Vascular and Interventional Radiology Procedure Note  Patient: Jordan [name] DOB: [birth year] Medical Record Number: [id number] Note Date/Time: 08/17/23 11:20 AM   Performing Physician: Roanna Banning, MD Assistant(s): None  Diagnosis:  Esophageal CA  Procedure: PORT PLACEMENT  Anesthesia: Conscious Sedation Complications: None Estimated Blood Loss: Minimal  Findings:  Successful right-sided port placement, with the tip of the catheter in the proximal right atrium.  Plan: Catheter ready for use.  See detailed procedure note with images in PACS. The patient tolerated the procedure well without incident or complication and was returned to Recovery in stable condition.    Roanna Banning, MD [facility] Radiology   Pager. [phone] Clinic. [phone]

## 2023-08-18 ENCOUNTER — Ambulatory Visit: Payer: 59

## 2023-08-18 ENCOUNTER — Ambulatory Visit
Admission: RE | Admit: 2023-08-18 | Discharge: 2023-08-18 | Disposition: A | Discharge status: Home / Self Care | Payer: 59 | Source: Ambulatory Visit | Attending: Radiation Oncology

## 2023-08-18 ENCOUNTER — Other Ambulatory Visit: Payer: Self-pay

## 2023-08-18 DIAGNOSIS — Z23 Encounter for immunization: Secondary | ICD-10-CM | POA: Diagnosis not present

## 2023-08-18 DIAGNOSIS — R110 Nausea: Secondary | ICD-10-CM | POA: Diagnosis not present

## 2023-08-18 DIAGNOSIS — Z510 Encounter for antineoplastic radiation therapy: Secondary | ICD-10-CM | POA: Diagnosis not present

## 2023-08-18 DIAGNOSIS — R0989 Other specified symptoms and signs involving the circulatory and respiratory systems: Secondary | ICD-10-CM | POA: Diagnosis not present

## 2023-08-18 DIAGNOSIS — K449 Diaphragmatic hernia without obstruction or gangrene: Secondary | ICD-10-CM | POA: Diagnosis not present

## 2023-08-18 DIAGNOSIS — Z87891 Personal history of nicotine dependence: Secondary | ICD-10-CM | POA: Diagnosis not present

## 2023-08-18 DIAGNOSIS — Z79899 Other long term (current) drug therapy: Secondary | ICD-10-CM | POA: Diagnosis not present

## 2023-08-18 DIAGNOSIS — Z7951 Long term (current) use of inhaled steroids: Secondary | ICD-10-CM | POA: Diagnosis not present

## 2023-08-18 DIAGNOSIS — R490 Dysphonia: Secondary | ICD-10-CM | POA: Diagnosis not present

## 2023-08-18 DIAGNOSIS — I10 Essential (primary) hypertension: Secondary | ICD-10-CM | POA: Diagnosis not present

## 2023-08-18 DIAGNOSIS — M25471 Effusion, right ankle: Secondary | ICD-10-CM | POA: Diagnosis not present

## 2023-08-18 DIAGNOSIS — R079 Chest pain, unspecified: Secondary | ICD-10-CM | POA: Diagnosis not present

## 2023-08-18 DIAGNOSIS — Z5111 Encounter for antineoplastic chemotherapy: Secondary | ICD-10-CM | POA: Diagnosis not present

## 2023-08-18 DIAGNOSIS — C155 Malignant neoplasm of lower third of esophagus: Secondary | ICD-10-CM | POA: Diagnosis not present

## 2023-08-18 DIAGNOSIS — R55 Syncope and collapse: Secondary | ICD-10-CM | POA: Diagnosis not present

## 2023-08-18 DIAGNOSIS — J449 Chronic obstructive pulmonary disease, unspecified: Secondary | ICD-10-CM | POA: Diagnosis not present

## 2023-08-18 DIAGNOSIS — R600 Localized edema: Secondary | ICD-10-CM | POA: Diagnosis not present

## 2023-08-18 DIAGNOSIS — K219 Gastro-esophageal reflux disease without esophagitis: Secondary | ICD-10-CM | POA: Diagnosis not present

## 2023-08-18 DIAGNOSIS — M25472 Effusion, left ankle: Secondary | ICD-10-CM | POA: Diagnosis not present

## 2023-08-18 LAB — RAD ONC ARIA SESSION SUMMARY
Course Elapsed Days: 3
Plan Fractions Treated to Date: 4
Plan Prescribed Dose Per Fraction: 1.8 Gy
Plan Total Fractions Prescribed: 25
Plan Total Prescribed Dose: 45 Gy
Reference Point Dosage Given to Date: 7.2 Gy
Reference Point Session Dosage Given: 1.8 Gy
Session Number: 4

## 2023-08-21 ENCOUNTER — Ambulatory Visit: Payer: 59 | Admitting: Radiation Oncology

## 2023-08-21 ENCOUNTER — Encounter: Payer: Self-pay | Admitting: Genetic Counselor

## 2023-08-21 ENCOUNTER — Ambulatory Visit
Admission: RE | Admit: 2023-08-21 | Discharge: 2023-08-21 | Disposition: A | Discharge status: Home / Self Care | Payer: 59 | Source: Ambulatory Visit | Attending: Radiation Oncology | Admitting: Radiation Oncology

## 2023-08-21 ENCOUNTER — Other Ambulatory Visit: Payer: Self-pay

## 2023-08-21 ENCOUNTER — Inpatient Hospital Stay: Payer: 59

## 2023-08-21 ENCOUNTER — Inpatient Hospital Stay (HOSPITAL_BASED_OUTPATIENT_CLINIC_OR_DEPARTMENT_OTHER): Payer: 59 | Admitting: Genetic Counselor

## 2023-08-21 ENCOUNTER — Other Ambulatory Visit: Payer: Self-pay | Admitting: Genetic Counselor

## 2023-08-21 DIAGNOSIS — Z8042 Family history of malignant neoplasm of prostate: Secondary | ICD-10-CM

## 2023-08-21 DIAGNOSIS — Z23 Encounter for immunization: Secondary | ICD-10-CM | POA: Diagnosis not present

## 2023-08-21 DIAGNOSIS — J449 Chronic obstructive pulmonary disease, unspecified: Secondary | ICD-10-CM | POA: Diagnosis not present

## 2023-08-21 DIAGNOSIS — C155 Malignant neoplasm of lower third of esophagus: Secondary | ICD-10-CM

## 2023-08-21 DIAGNOSIS — C189 Malignant neoplasm of colon, unspecified: Secondary | ICD-10-CM

## 2023-08-21 DIAGNOSIS — Z800 Family history of malignant neoplasm of digestive organs: Secondary | ICD-10-CM

## 2023-08-21 DIAGNOSIS — Z510 Encounter for antineoplastic radiation therapy: Secondary | ICD-10-CM | POA: Diagnosis not present

## 2023-08-21 DIAGNOSIS — R55 Syncope and collapse: Secondary | ICD-10-CM | POA: Diagnosis not present

## 2023-08-21 DIAGNOSIS — Z87891 Personal history of nicotine dependence: Secondary | ICD-10-CM | POA: Diagnosis not present

## 2023-08-21 DIAGNOSIS — R490 Dysphonia: Secondary | ICD-10-CM | POA: Diagnosis not present

## 2023-08-21 DIAGNOSIS — R079 Chest pain, unspecified: Secondary | ICD-10-CM | POA: Diagnosis not present

## 2023-08-21 DIAGNOSIS — K219 Gastro-esophageal reflux disease without esophagitis: Secondary | ICD-10-CM | POA: Diagnosis not present

## 2023-08-21 DIAGNOSIS — R0989 Other specified symptoms and signs involving the circulatory and respiratory systems: Secondary | ICD-10-CM | POA: Diagnosis not present

## 2023-08-21 DIAGNOSIS — M25472 Effusion, left ankle: Secondary | ICD-10-CM | POA: Diagnosis not present

## 2023-08-21 DIAGNOSIS — R110 Nausea: Secondary | ICD-10-CM | POA: Diagnosis not present

## 2023-08-21 DIAGNOSIS — Z803 Family history of malignant neoplasm of breast: Secondary | ICD-10-CM | POA: Diagnosis not present

## 2023-08-21 DIAGNOSIS — K449 Diaphragmatic hernia without obstruction or gangrene: Secondary | ICD-10-CM | POA: Diagnosis not present

## 2023-08-21 DIAGNOSIS — M25471 Effusion, right ankle: Secondary | ICD-10-CM | POA: Diagnosis not present

## 2023-08-21 DIAGNOSIS — R600 Localized edema: Secondary | ICD-10-CM | POA: Diagnosis not present

## 2023-08-21 DIAGNOSIS — Z79899 Other long term (current) drug therapy: Secondary | ICD-10-CM | POA: Diagnosis not present

## 2023-08-21 DIAGNOSIS — Z7951 Long term (current) use of inhaled steroids: Secondary | ICD-10-CM | POA: Diagnosis not present

## 2023-08-21 DIAGNOSIS — I10 Essential (primary) hypertension: Secondary | ICD-10-CM | POA: Diagnosis not present

## 2023-08-21 DIAGNOSIS — Z5111 Encounter for antineoplastic chemotherapy: Secondary | ICD-10-CM | POA: Diagnosis not present

## 2023-08-21 LAB — CMP (CANCER CENTER ONLY)
ALT: 13 U/L (ref 0–44)
AST: 14 U/L — ABNORMAL LOW (ref 15–41)
Albumin: 3.8 g/dL (ref 3.5–5.0)
Alkaline Phosphatase: 81 U/L (ref 38–126)
Anion gap: 6 (ref 5–15)
BUN: 28 mg/dL — ABNORMAL HIGH (ref 8–23)
CO2: 30 mmol/L (ref 22–32)
Calcium: 9.4 mg/dL (ref 8.9–10.3)
Chloride: 100 mmol/L (ref 98–111)
Creatinine: 1.4 mg/dL — ABNORMAL HIGH (ref 0.61–1.24)
GFR, Estimated: 55 mL/min — ABNORMAL LOW (ref >60)
Glucose, Bld: 106 mg/dL — ABNORMAL HIGH (ref 70–99)
Potassium: 4.8 mmol/L (ref 3.5–5.1)
Sodium: 136 mmol/L (ref 135–145)
Total Bilirubin: 0.5 mg/dL (ref 0.3–1.2)
Total Protein: 7 g/dL (ref 6.5–8.1)

## 2023-08-21 LAB — CBC WITH DIFFERENTIAL (CANCER CENTER ONLY)
Abs Immature Granulocytes: 0.09 10*3/uL — ABNORMAL HIGH (ref 0.00–0.07)
Basophils Absolute: 0 10*3/uL (ref 0.0–0.1)
Basophils Relative: 1 %
Eosinophils Absolute: 0.1 10*3/uL (ref 0.0–0.5)
Eosinophils Relative: 2 %
HCT: 49.5 % (ref 39.0–52.0)
Hemoglobin: 16.3 g/dL (ref 13.0–17.0)
Immature Granulocytes: 2 %
Lymphocytes Relative: 12 %
Lymphs Abs: 0.7 10*3/uL (ref 0.7–4.0)
MCH: 29.1 pg (ref 26.0–34.0)
MCHC: 32.9 g/dL (ref 30.0–36.0)
MCV: 88.4 fL (ref 80.0–100.0)
Monocytes Absolute: 0.4 10*3/uL (ref 0.1–1.0)
Monocytes Relative: 6 %
Neutro Abs: 4.6 10*3/uL (ref 1.7–7.7)
Neutrophils Relative %: 77 %
Platelet Count: 217 10*3/uL (ref 150–400)
RBC: 5.6 MIL/uL (ref 4.22–5.81)
RDW: 13.2 % (ref 11.5–15.5)
WBC Count: 5.9 10*3/uL (ref 4.0–10.5)
nRBC: 0 % (ref 0.0–0.2)

## 2023-08-21 LAB — GENETIC SCREENING ORDER

## 2023-08-21 LAB — RAD ONC ARIA SESSION SUMMARY
Course Elapsed Days: 6
Plan Fractions Treated to Date: 5
Plan Prescribed Dose Per Fraction: 1.8 Gy
Plan Total Fractions Prescribed: 25
Plan Total Prescribed Dose: 45 Gy
Reference Point Dosage Given to Date: 9 Gy
Reference Point Session Dosage Given: 1.8 Gy
Session Number: 5

## 2023-08-21 MED FILL — Dexamethasone Sodium Phosphate Inj 100 MG/10ML: INTRAMUSCULAR | Qty: 1 | Status: AC

## 2023-08-21 NOTE — Progress Notes (Signed)
REFERRING PROVIDER: Malachy Mood, MD [address] [place],  [place] [address]   PRIMARY PROVIDER:  Storm Frisk, MD  PRIMARY REASON FOR VISIT:  1. Adenocarcinoma of colon (HCC)   2. Malignant neoplasm of lower third of esophagus (HCC)   3. Family history of pancreatic cancer   4. Family history of prostate cancer      HISTORY OF PRESENT ILLNESS:   Jordan [name], a 67 y.o. male, was seen for a [facility] cancer genetics consultation at the request of Dr. Mosetta Putt due to a personal and family history of gastrointestinal cancers.  Jordan [name] presents to clinic today to discuss the possibility of a hereditary predisposition to cancer, to discuss genetic testing, and to further clarify his future cancer risks, as well as potential cancer risks for family members.   In December 2021, at the age of 89, Jordan [name] was diagnosed with adenocarcinoma of the colon.  Mismatch repair IHC results were not available.  He has a lifetime history of nine colon polyps (tubular adenomas and hyperplastic polyps).   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  He is currently undergoing concurrent chemotherapy and radiation.   CANCER HISTORY:  Oncology History  Esophageal cancer (HCC)  07/13/2023 Initial Diagnosis   Esophageal cancer (HCC)   08/15/2023 -  Chemotherapy   Patient is on Treatment Plan : ESOPHAGUS Carboplatin + Paclitaxel Weekly X 6 Weeks with XRT        Past Medical History:  Diagnosis Date   Allergy    seasonal   Aneurysm of infrarenal abdominal aorta (HCC)    4.2 cm 07/12/23   Arthritis    Colon cancer (HCC)    Compression fracture of thoracic vertebra with routine healing 04/30/2020   COPD (chronic obstructive pulmonary disease) (HCC)    Family history of adverse reaction to anesthesia    mother slow to awaken   GERD (gastroesophageal reflux disease)    Hepatitis    hepatitis c dx in remission per pt    Hypertension    Inguinal hernia    Multiple fractures 06/04/2020   Positive colorectal cancer screening using Cologuard test 08/27/2020   Substance abuse (HCC)    Wears glasses    for reading    Past Surgical History:  Procedure Laterality Date   COLONOSCOPY  11/11/2020   ENDOSCOPIC MUCOSAL RESECTION N/A 03/10/2021   Procedure: ENDOSCOPIC MUCOSAL RESECTION;  Surgeon: Lemar Lofty., MD;  Location: [facility];  Service: Gastroenterology;  Laterality: N/A;   ESOPHAGOGASTRODUODENOSCOPY (EGD) WITH PROPOFOL N/A 07/27/2023   Procedure: ESOPHAGOGASTRODUODENOSCOPY (EGD) WITH PROPOFOL;  Surgeon: Meridee Score Netty Starring., MD;  Location: [facility];  Service: Gastroenterology;  Laterality: N/A;   EUS N/A 07/27/2023   Procedure: UPPER ENDOSCOPIC ULTRASOUND (EUS) RADIAL;  Surgeon: Lemar Lofty., MD;  Location: [facility];  Service: Gastroenterology;  Laterality: N/A;   FLEXIBLE SIGMOIDOSCOPY N/A 03/10/2021   Procedure: FLEXIBLE SIGMOIDOSCOPY;  Surgeon: Meridee Score Netty Starring., MD;  Location: [facility];  Service: Gastroenterology;  Laterality: N/A;   HEMOSTASIS CLIP PLACEMENT  03/10/2021   Procedure: HEMOSTASIS CLIP PLACEMENT;  Surgeon: Lemar Lofty., MD;  Location: [facility];  Service: Gastroenterology;;   INGUINAL HERNIA REPAIR Right 01/19/2021   Procedure: LAPAROSCOPIC RIGHT INGUINAL HERNIA REPAIR WITH MESH;  Surgeon: Stechschulte, Hyman Hopes, MD;  Location: [facility];  Service: General;  Laterality: Right;   IR IMAGING GUIDED PORT INSERTION  08/17/2023   obstruction removed  as a child   swallowed a peanut   POLYPECTOMY  03/10/2021   Procedure: POLYPECTOMY;  Surgeon: Mansouraty, Netty Starring., MD;  Location: [facility];  Service: Gastroenterology;;   POLYPECTOMY     SUBMUCOSAL LIFTING INJECTION  03/10/2021   Procedure: SUBMUCOSAL LIFTING INJECTION;  Surgeon: Lemar Lofty., MD;  Location: [facility];  Service: Gastroenterology;;     FAMILY HISTORY:  We obtained a detailed, 4-generation family history.  Significant diagnoses are listed below: Family History  Problem Relation Age of Onset   Colon polyps Father    Stomach cancer Father 21   Prostate cancer Father        dx late 23s   Lung cancer Sister 80       small cell; smoking hx   Pancreatic cancer Sister 72   Colon polyps Brother        less than 10 lifetime   Colon polyps Brother        less than 10 lifetime   Lung cancer Maternal Aunt        d. 81s; smoking hx   Prostate cancer Maternal Uncle        x4 mat uncles; dx >50; no mets   Kidney cancer Maternal Uncle 16   Melanoma Paternal Uncle        dx >50; systemic therapy   Lung cancer Paternal Uncle        dx >50   Breast cancer Cousin        mat male cousin; dx <50   Uterine cancer Cousin        mat male cousin dx <50   Esophageal cancer Cousin        mat male cousin dx >50    Jordan [name] is unaware of previous family history of genetic testing for hereditary cancer risks.  Other relatives are unavailable for genetic testing at this time.   Jordan [name] reported consanguinity in his parents (4-5 degrees of relation).  There is no reported Ashkenazi Jewish ancestry.   GENETIC COUNSELING ASSESSMENT: Jordan [name] is a 67 y.o. male with a personal and family history which is somewhat suggestive of a hereditary cancer syndrome and predisposition to cancer given the presence of related cancers in the family (colon, pancreatic, prostate, uterine, etc). We, therefore, discussed and recommended the following at today's visit.   DISCUSSION: We discussed that 5 - 10% of cancer is hereditary.  Most cases of hereditary colon cancer associated with mutations in the Lynch syndrome genes.  Most cases of hereditary pancreatic cancer are associated with mutations in BRCA1/2.  There are other genes that can be associated with hereditary colon, pancreatic, breast, prostate, uterine, and other cancer  syndromes.  We discussed that testing is beneficial for several reasons including knowing how to follow individuals for their cancer risks and understanding if other family members could be at risk for cancer and allowing them to undergo genetic testing.   We reviewed the characteristics, features and inheritance patterns of hereditary cancer syndromes. We also discussed genetic testing, including the appropriate family members to test, the process of testing, insurance coverage and turn-around-time for results. We discussed the implications of a negative, positive, carrier and/or variant of uncertain significant result. We recommended Jordan [name] pursue genetic testing for a panel that includes genes associated with colon, pancreatic, kidney, melanoma, breast, prostate, uterine, and other cancers.   The Multi-Cancer + RNA Panel offered by [facility] includes sequencing and/or deletion/duplication analysis of the following 70 genes:  AIP*,  ALK, APC*, ATM*, AXIN2*, BAP1*, BARD1*, BLM*, BMPR1A*, BRCA1*, BRCA2*, BRIP1*, CDC73*, CDH1*, CDK4, CDKN1B*, CDKN2A, CHEK2*, CTNNA1*, DICER1*, EPCAM (del/dup only), EGFR, FH*, FLCN*, GREM1 (promoter dup only), HOXB13, KIT, LZTR1, MAX*, MBD4, MEN1*, MET, MITF, MLH1*, MSH2*, MSH3*, MSH6*, MUTYH*, NF1*, NF2*, NTHL1*, PALB2*, PDGFRA, PMS2*, POLD1*, POLE*, POT1*, PRKAR1A*, PTCH1*, PTEN*, RAD51C*, RAD51D*, RB1*, RET, SDHA* (sequencing only), SDHAF2*, SDHB*, SDHC*, SDHD*, SMAD4*, SMARCA4*, SMARCB1*, SMARCE1*, STK11*, SUFU*, TMEM127*, TP53*, TSC1*, TSC2*, VHL*. RNA analysis is performed for * genes.  Based on Jordan [name] personal history of colon cancer along with his family history of a first degree relative with pancreatic cancer, he meets medical criteria for genetic testing. Despite that he meets criteria, he may still have an out of pocket cost. We discussed that if his out of pocket cost for testing is over $100, the laboratory should contact him and discuss the self-pay  prices and/or patient pay assistance programs.    PLAN: After considering the risks, benefits, and limitations, Jordan [name] provided informed consent to pursue genetic testing and the blood sample was sent to [facility] for analysis of the Multi-Cancer +RNA. Results should be available within approximately 2-3 weeks' time, at which point they will be disclosed by telephone to Jordan [name], as will any additional recommendations warranted by these results. Jordan [name] will receive a summary of his genetic counseling visit and a copy of his results once available. This information will also be available in Epic.   Based on Jordan [name] family history of pancreatic cancer in his sister, we recommended first degree relatives of this sister have genetic counseling and testing. Jordan [name] can let us know if we can be of any assistance in coordinating genetic counseling and/or testing for these family members.   Jordan [name] questions were answered to his satisfaction today. Our contact information was provided should additional questions or concerns arise. Thank you for the referral and allowing [place] to share in the care of your patient.   Raneisha Bress M. Rennie Plowman, MS, [facility] Genetic Counselor [email or web address] (P) [phone]  The patient was seen for a total of 35 minutes in face-to-face genetic counseling.  The patient was accompanied by his mother.  Drs. Babs Bertin, or [place] were available to discuss this case as needed.    _______________________________________________________________________ For Office Staff:  Number of people involved in session: 2 Was an Intern/ student involved with case: no

## 2023-08-21 NOTE — Assessment & Plan Note (Signed)
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  We have reviewed his case in the tumor board, and recommend biopsy of the sternal bone lesion.   -He underwent a EUS staging, which showed 2 tumors in the distal esophagus, staged as T1smN0 and T2N0.  -His sternal bone biopsy was negative for malignant cells. -We discussed the option of perioperative chemotherapy FLOT, versus concurrent chemoradiation with weekly carboplatin and paclitaxel, followed by surgery or consolidation FOLFOX and immunotherapy.  Patient is reluctant to consider surgery, and decided to proceed with concurrent chemotherapy and radiation. He started on 08/15/2023.

## 2023-08-22 ENCOUNTER — Telehealth: Payer: Self-pay

## 2023-08-22 ENCOUNTER — Inpatient Hospital Stay: Payer: 59

## 2023-08-22 ENCOUNTER — Other Ambulatory Visit: Payer: Self-pay

## 2023-08-22 ENCOUNTER — Ambulatory Visit
Admission: RE | Admit: 2023-08-22 | Discharge: 2023-08-22 | Disposition: A | Discharge status: Home / Self Care | Payer: 59 | Source: Ambulatory Visit | Attending: Radiation Oncology

## 2023-08-22 ENCOUNTER — Ambulatory Visit: Payer: 59 | Admitting: Radiation Oncology

## 2023-08-22 ENCOUNTER — Inpatient Hospital Stay (HOSPITAL_BASED_OUTPATIENT_CLINIC_OR_DEPARTMENT_OTHER): Payer: 59 | Admitting: Hematology

## 2023-08-22 ENCOUNTER — Encounter: Payer: Self-pay | Admitting: Hematology

## 2023-08-22 ENCOUNTER — Telehealth: Payer: Self-pay | Admitting: Critical Care Medicine

## 2023-08-22 VITALS — BP 136/77 | HR 78 | Temp 98.2°F | Resp 16 | Wt 170.5 lb

## 2023-08-22 DIAGNOSIS — I10 Essential (primary) hypertension: Secondary | ICD-10-CM | POA: Diagnosis not present

## 2023-08-22 DIAGNOSIS — M25471 Effusion, right ankle: Secondary | ICD-10-CM | POA: Diagnosis not present

## 2023-08-22 DIAGNOSIS — Z510 Encounter for antineoplastic radiation therapy: Secondary | ICD-10-CM | POA: Diagnosis not present

## 2023-08-22 DIAGNOSIS — J449 Chronic obstructive pulmonary disease, unspecified: Secondary | ICD-10-CM | POA: Diagnosis not present

## 2023-08-22 DIAGNOSIS — C155 Malignant neoplasm of lower third of esophagus: Secondary | ICD-10-CM

## 2023-08-22 DIAGNOSIS — R110 Nausea: Secondary | ICD-10-CM | POA: Diagnosis not present

## 2023-08-22 DIAGNOSIS — R55 Syncope and collapse: Secondary | ICD-10-CM | POA: Diagnosis not present

## 2023-08-22 DIAGNOSIS — Z7951 Long term (current) use of inhaled steroids: Secondary | ICD-10-CM | POA: Diagnosis not present

## 2023-08-22 DIAGNOSIS — Z87891 Personal history of nicotine dependence: Secondary | ICD-10-CM | POA: Diagnosis not present

## 2023-08-22 DIAGNOSIS — M25472 Effusion, left ankle: Secondary | ICD-10-CM | POA: Diagnosis not present

## 2023-08-22 DIAGNOSIS — R600 Localized edema: Secondary | ICD-10-CM | POA: Diagnosis not present

## 2023-08-22 DIAGNOSIS — Z5111 Encounter for antineoplastic chemotherapy: Secondary | ICD-10-CM | POA: Diagnosis not present

## 2023-08-22 DIAGNOSIS — Z23 Encounter for immunization: Secondary | ICD-10-CM | POA: Diagnosis not present

## 2023-08-22 DIAGNOSIS — R0989 Other specified symptoms and signs involving the circulatory and respiratory systems: Secondary | ICD-10-CM | POA: Diagnosis not present

## 2023-08-22 DIAGNOSIS — Z79899 Other long term (current) drug therapy: Secondary | ICD-10-CM | POA: Diagnosis not present

## 2023-08-22 DIAGNOSIS — R490 Dysphonia: Secondary | ICD-10-CM | POA: Diagnosis not present

## 2023-08-22 DIAGNOSIS — K449 Diaphragmatic hernia without obstruction or gangrene: Secondary | ICD-10-CM | POA: Diagnosis not present

## 2023-08-22 DIAGNOSIS — R079 Chest pain, unspecified: Secondary | ICD-10-CM | POA: Diagnosis not present

## 2023-08-22 DIAGNOSIS — K219 Gastro-esophageal reflux disease without esophagitis: Secondary | ICD-10-CM | POA: Diagnosis not present

## 2023-08-22 LAB — RAD ONC ARIA SESSION SUMMARY
Course Elapsed Days: 7
Plan Fractions Treated to Date: 6
Plan Prescribed Dose Per Fraction: 1.8 Gy
Plan Total Fractions Prescribed: 25
Plan Total Prescribed Dose: 45 Gy
Reference Point Dosage Given to Date: 10.8 Gy
Reference Point Session Dosage Given: 1.8 Gy
Session Number: 6

## 2023-08-22 MED ORDER — FAMOTIDINE IN NACL 20-0.9 MG/50ML-% IV SOLN
20.0000 mg | Freq: Once | INTRAVENOUS | Status: AC
  Administered 2023-08-22: 20 mg via INTRAVENOUS
  Filled 2023-08-22: qty 50

## 2023-08-22 MED ORDER — SODIUM CHLORIDE 0.9 % IV SOLN: Freq: Once | INTRAVENOUS | Status: AC

## 2023-08-22 MED ORDER — PALONOSETRON HCL INJECTION 0.25 MG/5ML
0.2500 mg | Freq: Once | INTRAVENOUS | Status: AC
  Administered 2023-08-22: 0.25 mg via INTRAVENOUS
  Filled 2023-08-22: qty 5

## 2023-08-22 MED ORDER — SUCRALFATE 1 G PO TABS
1.0000 g | ORAL_TABLET | Freq: Three times a day (TID) | ORAL | 1 refills | Status: DC
  Filled 2023-08-22: qty 60, 15d supply, fill #0

## 2023-08-22 MED ORDER — HEPARIN SOD (PORK) LOCK FLUSH 100 UNIT/ML IV SOLN
500.0000 [IU] | Freq: Once | INTRAVENOUS | Status: AC | PRN
  Administered 2023-08-22: 500 [IU]

## 2023-08-22 MED ORDER — DIPHENHYDRAMINE HCL 50 MG/ML IJ SOLN
25.0000 mg | Freq: Once | INTRAMUSCULAR | Status: AC
  Administered 2023-08-22: 25 mg via INTRAVENOUS
  Filled 2023-08-22: qty 1

## 2023-08-22 MED ORDER — SODIUM CHLORIDE 0.9% FLUSH
10.0000 mL | INTRAVENOUS | Status: DC | PRN
  Administered 2023-08-22: 10 mL

## 2023-08-22 MED ORDER — SODIUM CHLORIDE 0.9 % IV SOLN
161.0000 mg | Freq: Once | INTRAVENOUS | Status: AC
  Administered 2023-08-22: 160 mg via INTRAVENOUS
  Filled 2023-08-22: qty 16

## 2023-08-22 MED ORDER — SODIUM CHLORIDE 0.9 % IV SOLN
10.0000 mg | Freq: Once | INTRAVENOUS | Status: AC
  Administered 2023-08-22: 10 mg via INTRAVENOUS
  Filled 2023-08-22: qty 10

## 2023-08-22 MED ORDER — SODIUM CHLORIDE 0.9 % IV SOLN
50.0000 mg/m2 | Freq: Once | INTRAVENOUS | Status: AC
  Administered 2023-08-22: 102 mg via INTRAVENOUS
  Filled 2023-08-22: qty 17

## 2023-08-22 NOTE — Telephone Encounter (Signed)
Patient called and his mother Peggyann answered the phone, he wasn't at home. Advised the refill valsartan was stopped by Dr. Delford Field. She says Dr. Delford Field told him to start back taking it because his BP was going back up. He has an appointment in December with Dr. Alvis Lemmings, but advised he will need a sooner appointment with a provider to check BP and prescribe this medication if needed. She says he has a few pills left. Scheduled first available with Marylene Land on 09/13/23, advised I will send this to the office for a refill by another provider and possibly a nurse visit appointment to check BP. She verbalized understanding.

## 2023-08-22 NOTE — Telephone Encounter (Deleted)
Cal placed to patient . Unable to reach. Unable to leave message. Call to schedule appointment with new provider.  valsartan-hydrochlorothiazide (DIOVAN-HCT) 320-25 MG tablet was d/c by patient prior PCP on 01/11/2023. No new order noted in the chart.

## 2023-08-22 NOTE — Patient Instructions (Signed)
[facility]  Discharge Instructions: Thank you for choosing [facility] to provide your oncology and hematology care.   If you have a lab appointment with the Cancer Center, please go directly to the Cancer Center and check in at the registration area.   Wear comfortable clothing and clothing appropriate for easy access to any Portacath or PICC line.   We strive to give you quality time with your provider. You may need to reschedule your appointment if you arrive late (15 or more minutes).  Arriving late affects you and other patients whose appointments are after yours.  Also, if you miss three or more appointments without notifying the office, you may be dismissed from the clinic at the provider's discretion.      For prescription refill requests, have your pharmacy contact our office and allow 72 hours for refills to be completed.    Today you received the following chemotherapy and/or immunotherapy agents; Paclitaxel & Carboplatin      To help prevent nausea and vomiting after your treatment, we encourage you to take your nausea medication as directed.  BELOW ARE SYMPTOMS THAT SHOULD BE REPORTED IMMEDIATELY: *FEVER GREATER THAN 100.4 F (38 C) OR HIGHER *CHILLS OR SWEATING *NAUSEA AND VOMITING THAT IS NOT CONTROLLED WITH YOUR NAUSEA MEDICATION *UNUSUAL SHORTNESS OF BREATH *UNUSUAL BRUISING OR BLEEDING *URINARY PROBLEMS (pain or burning when urinating, or frequent urination) *BOWEL PROBLEMS (unusual diarrhea, constipation, pain near the anus) TENDERNESS IN MOUTH AND THROAT WITH OR WITHOUT PRESENCE OF ULCERS (sore throat, sores in mouth, or a toothache) UNUSUAL RASH, SWELLING OR PAIN  UNUSUAL VAGINAL DISCHARGE OR ITCHING   Items with * indicate a potential emergency and should be followed up as soon as possible or go to the Emergency Department if any problems should occur.  Please show the CHEMOTHERAPY ALERT CARD or IMMUNOTHERAPY  ALERT CARD at check-in to the Emergency Department and triage nurse.  Should you have questions after your visit or need to cancel or reschedule your appointment, please contact [facility]  Dept: [phone]  and follow the prompts.  Office hours are 8:00 a.m. to 4:30 p.m. Monday - Friday. Please note that voicemails left after 4:00 p.m. may not be returned until the following business day.  We are closed weekends and major holidays. You have access to a nurse at all times for urgent questions. Please call the main number to the clinic Dept: [phone] and follow the prompts.   For any non-urgent questions, you may also contact your provider using MyChart. We now offer e-Visits for anyone 18 and older to request care online for non-urgent symptoms. For details visit [email or web address].   Also download the MyChart app! Go to the app store, search "MyChart", open the app, select [facility], and log in with your MyChart username and password.  

## 2023-08-22 NOTE — Telephone Encounter (Signed)
Call placed to patient . Unable to reach. Unable to leave message. Call to schedule appointment with new provider.  valsartan-hydrochlorothiazide (DIOVAN-HCT) 320-25 MG tablet was d/c by patient prior PCP on 01/11/2023. No new order noted in the chart to restart med.

## 2023-08-22 NOTE — Telephone Encounter (Signed)
Medication Refill - Medication: valsartan-hydrochlorothiazide (DIOVAN-HCT) 320-25 MG tablet [[id number]]  Has the patient contacted their pharmacy? No.   ) Preferred Pharmacy (with phone number or street name):  [facility]  [address], [address], [place] [address]  Phone:  [phone]  Fax:  [phone]  DEA #:  [id number]   Has the patient been seen for an appointment in the last year OR does the patient have an upcoming appointment? Yes.    Agent: Please be advised that RX refills may take up to 3 business days. We ask that you follow-up with your pharmacy.

## 2023-08-22 NOTE — Progress Notes (Signed)
[facility]   [phone] [phone]   Clinic Follow up Note   Patient Care Team: Storm Frisk, MD as PCP - General (Pulmonary Disease) Donzetta Starch, MD as Consulting Physician (Dermatology) Veryl Speak, FNP as Nurse Practitioner (Infectious Diseases) Forestine Na (Physician Assistant) Bradly Bienenstock, MD as Consulting Physician (Orthopedic Surgery) Malachy Mood, MD as Consulting Physician (Hematology and Oncology)  Date of Service:  08/22/2023  CHIEF COMPLAINT: f/u of esophageal cancer  CURRENT THERAPY:  Concurrent chemoradiation with weekly carboplatin and paclitaxel  Oncology History   Esophageal cancer [facility]) Distal esophageal carcinoma, 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  We have reviewed his case in the tumor board, and recommend biopsy of the sternal bone lesion.   -He underwent a EUS staging, which showed 2 tumors in the distal esophagus, staged as T1smN0 and T2N0.  -His sternal bone biopsy was negative for malignant cells. -We discussed the option of perioperative chemotherapy FLOT, versus concurrent chemoradiation with weekly carboplatin and paclitaxel, followed by surgery or consolidation FOLFOX and immunotherapy.  Patient is reluctant to consider surgery, and decided to proceed with concurrent chemotherapy and radiation. He started on 08/15/2023.     Assessment and Plan    Esophageal Cancer Undergoing concurrent chemo and radiation therapy. Reports sore throat and difficulty swallowing, likely due to radiation-induced inflammation. Stable weight. Labs including blood counts, kidney and  liver function are within normal limits. -Order Sucralfate to help with sore throat. -Advise on dietary modifications:soft diet, avoid spicy and acidic foods. -Continue current treatment regimen.  Persistent Cough Despite cough syrup, patient still reports persistent cough. No improvement with home remedies. -Continue current cough syrup.  Peripheral Edema Reports some improvement in ankle swelling. -Advise to continue elevating feet when at home.  Follow-up Weekly appointments before each treatment. -Next appointment scheduled for 08/29/2023.        SUMMARY OF ONCOLOGIC HISTORY: Oncology History  Esophageal cancer (HCC)  07/13/2023 Initial Diagnosis   Esophageal cancer (HCC)   08/15/2023 -  Chemotherapy   Patient is on Treatment Plan : ESOPHAGUS Carboplatin + Paclitaxel Weekly X 6 Weeks with XRT        Discussed the use of AI scribe software for clinical note transcription with the patient, who gave verbal consent to proceed.  History of Present Illness   The patient, a 67 year old with esophageal cancer, is currently undergoing concurrent chemo and radiation treatment. He reports a sore throat and difficulty swallowing, which started during the second week of treatment. The discomfort is localized to the throat and has made swallowing food increasingly difficult. He has been attempting to manage the discomfort by modifying his diet to include softer foods and ensuring he chews well before swallowing. Despite his preference for spicy and acidic foods, he has been advised to avoid these to prevent further irritation. He has also been consuming apple juice instead of orange juice to reduce acidity.  In addition to the throat discomfort, the patient reports a persistent cough that causes chest pain. He has been using a homemade remedy of honey, whiskey, and lemon juice, which seems to provide some relief. He also reports a weight fluctuation, with a loss of three  pounds that was  regained over the weekend.  The patient also has a port for his treatment, which was inserted last week and is being used for the first time today. He reports some swelling in the ankles, which he manages by elevating his feet at home.         All other systems were reviewed with the patient and are negative.  MEDICAL HISTORY:  Past Medical History:  Diagnosis Date   Allergy    seasonal   Aneurysm of infrarenal abdominal aorta (HCC)    4.2 cm 07/12/23   Arthritis    Colon cancer (HCC)    Compression fracture of thoracic vertebra with routine healing 04/30/2020   COPD (chronic obstructive pulmonary disease) (HCC)    Family history of adverse reaction to anesthesia    mother slow to awaken   GERD (gastroesophageal reflux disease)    Hepatitis    hepatitis c dx in remission per pt   Hypertension    Inguinal hernia    Multiple fractures 06/04/2020   Positive colorectal cancer screening using Cologuard test 08/27/2020   Substance abuse (HCC)    Wears glasses    for reading    SURGICAL HISTORY: Past Surgical History:  Procedure Laterality Date   COLONOSCOPY  11/11/2020   ENDOSCOPIC MUCOSAL RESECTION N/A 03/10/2021   Procedure: ENDOSCOPIC MUCOSAL RESECTION;  Surgeon: Lemar Lofty., MD;  Location: [facility];  Service: Gastroenterology;  Laterality: N/A;   ESOPHAGOGASTRODUODENOSCOPY (EGD) WITH PROPOFOL N/A 07/27/2023   Procedure: ESOPHAGOGASTRODUODENOSCOPY (EGD) WITH PROPOFOL;  Surgeon: Meridee Score Netty Starring., MD;  Location: [facility];  Service: Gastroenterology;  Laterality: N/A;   EUS N/A 07/27/2023   Procedure: UPPER ENDOSCOPIC ULTRASOUND (EUS) RADIAL;  Surgeon: Lemar Lofty., MD;  Location: [facility];  Service: Gastroenterology;  Laterality: N/A;   FLEXIBLE SIGMOIDOSCOPY N/A 03/10/2021   Procedure: FLEXIBLE SIGMOIDOSCOPY;  Surgeon: Meridee Score Netty Starring., MD;  Location: [facility];  Service: Gastroenterology;  Laterality: N/A;   HEMOSTASIS CLIP  PLACEMENT  03/10/2021   Procedure: HEMOSTASIS CLIP PLACEMENT;  Surgeon: Lemar Lofty., MD;  Location: [facility];  Service: Gastroenterology;;   INGUINAL HERNIA REPAIR Right 01/19/2021   Procedure: LAPAROSCOPIC RIGHT INGUINAL HERNIA REPAIR WITH MESH;  Surgeon: Stechschulte, Hyman Hopes, MD;  Location: [facility];  Service: General;  Laterality: Right;   IR IMAGING GUIDED PORT INSERTION  08/17/2023   obstruction removed as a child   swallowed a peanut   POLYPECTOMY  03/10/2021   Procedure: POLYPECTOMY;  Surgeon: Mansouraty, Netty Starring., MD;  Location: [facility];  Service: Gastroenterology;;   POLYPECTOMY     SUBMUCOSAL LIFTING INJECTION  03/10/2021   Procedure: SUBMUCOSAL LIFTING INJECTION;  Surgeon: Lemar Lofty., MD;  Location: [facility];  Service: Gastroenterology;;    I have reviewed the social history and family history with the patient and they are unchanged from previous note.  ALLERGIES:  has No Known Allergies.  MEDICATIONS:  Current Outpatient Medications  Medication Sig Dispense Refill   sucralfate (CARAFATE) 1 g tablet Take 1 tablet (1 g total) by mouth 4 (four) times daily -  with meals and at bedtime. 60 tablet 1   albuterol (VENTOLIN HFA) 108 (90 Base) MCG/ACT inhaler Inhale 2 puffs into the lungs every 6 (six) hours as needed for wheezing or shortness of breath. 18 g 2   amLODipine (NORVASC) 10 MG tablet Take 1 tablet (10 mg total) by mouth daily. 90 tablet 2   fluticasone-salmeterol (ADVAIR HFA) 115-21 MCG/ACT inhaler Inhale  2 puffs into the lungs 2 (two) times daily. 12 g 12   guaiFENesin-codeine 100-10 MG/5ML syrup Take 5 mLs by mouth every 6 (six) hours as needed for cough. 118 mL 0   lidocaine-prilocaine (EMLA) cream Apply 1 Application topically as needed. 30 g 0   Multiple Vitamin (MULTIVITAMIN ADULT PO) Take by mouth.     omeprazole (PRILOSEC) 40 MG capsule Take 1 capsule (40 mg total) by mouth daily before dinner. 60 capsule 3    ondansetron (ZOFRAN) 8 MG tablet Take 1 tablet (8 mg total) by mouth every 8 (eight) hours as needed for nausea or vomiting. Start on the third day after chemotherapy. 30 tablet 1   prochlorperazine (COMPAZINE) 10 MG tablet Take 1 tablet (10 mg total) by mouth every 6 (six) hours as needed for nausea or vomiting. 30 tablet 1   promethazine-dextromethorphan (PROMETHAZINE-DM) 6.25-15 MG/5ML syrup Take 5 mLs by mouth 4 (four) times daily as needed for cough. 240 mL 1   sildenafil (VIAGRA) 100 MG tablet Take 1 tablet (100 mg total) by mouth daily as needed for erectile dysfunction. 10 tablet 3   No current facility-administered medications for this visit.   Facility-Administered Medications Ordered in Other Visits  Medication Dose Route Frequency Provider Last Rate Last Admin   CARBOplatin (PARAPLATIN) 160 mg in sodium chloride 0.9 % 100 mL chemo infusion  160 mg Intravenous Once Malachy Mood, MD       diphenhydrAMINE (BENADRYL) injection 25 mg  25 mg Intravenous Once Malachy Mood, MD       heparin lock flush 100 unit/mL  500 Units Intracatheter Once PRN Malachy Mood, MD       PACLitaxel (TAXOL) 102 mg in sodium chloride 0.9 % 250 mL chemo infusion (</= 80mg /m2)  50 mg/m2 (Treatment Plan Recorded) Intravenous Once Malachy Mood, MD       sodium chloride flush (NS) 0.9 % injection 10 mL  10 mL Intracatheter PRN Malachy Mood, MD        PHYSICAL EXAMINATION: ECOG PERFORMANCE STATUS: 1 - Symptomatic but completely ambulatory  There were no vitals filed for this visit. Wt Readings from Last 3 Encounters:  08/22/23 170 lb 8 oz (77.3 kg)  08/17/23 172 lb (78 kg)  08/15/23 171 lb 8 oz (77.8 kg)     GENERAL:alert, no distress and comfortable SKIN: skin color, texture, turgor are normal, no rashes or significant lesions EYES: normal, Conjunctiva are pink and non-injected, sclera clear Musculoskeletal:no cyanosis of digits and no clubbing  NEURO: alert & oriented x 3 with fluent speech, no focal motor/sensory  deficits  Physical Exam          LABORATORY DATA:  I have reviewed the data as listed    Latest Ref Rng & Units 08/21/2023    3:00 PM 08/14/2023    3:26 PM 08/04/2023    8:29 AM  CBC  WBC 4.0 - 10.5 K/uL 5.9  6.9  5.8   Hemoglobin 13.0 - 17.0 g/dL 16.1  09.6  04.5   Hematocrit 39.0 - 52.0 % 49.5  48.3  47.8   Platelets 150 - 400 K/uL 217  249  227         Latest Ref Rng & Units 08/21/2023    3:00 PM 08/14/2023    3:26 PM 07/31/2023    2:34 PM  CMP  Glucose 70 - 99 mg/dL 409  811  914   BUN 8 - 23 mg/dL 28  34  32   Creatinine 0.61 -  1.24 mg/dL 0.98  1.19  1.47   Sodium 135 - 145 mmol/L 136  136  138   Potassium 3.5 - 5.1 mmol/L 4.8  4.3  4.9   Chloride 98 - 111 mmol/L 100  104  104   CO2 22 - 32 mmol/L 30  24  29    Calcium 8.9 - 10.3 mg/dL 9.4  9.4  9.5   Total Protein 6.5 - 8.1 g/dL 7.0  7.1  7.2   Total Bilirubin 0.3 - 1.2 mg/dL 0.5  0.5  0.4   Alkaline Phos 38 - 126 U/L 81  83  87   AST 15 - 41 U/L 14  15  13    ALT 0 - 44 U/L 13  17  12        RADIOGRAPHIC STUDIES: I have personally reviewed the radiological images as listed and agreed with the findings in the report. No results found.    No orders of the defined types were placed in this encounter.  All questions were answered. The patient knows to call the clinic with any problems, questions or concerns. No barriers to learning was detected. The total time spent in the appointment was 25 minutes.     Malachy Mood, MD 08/22/2023

## 2023-08-23 ENCOUNTER — Ambulatory Visit
Admission: RE | Admit: 2023-08-23 | Discharge: 2023-08-23 | Disposition: A | Discharge status: Home / Self Care | Payer: 59 | Source: Ambulatory Visit | Attending: Radiation Oncology | Admitting: Radiation Oncology

## 2023-08-23 ENCOUNTER — Other Ambulatory Visit: Payer: Self-pay

## 2023-08-23 ENCOUNTER — Ambulatory Visit: Payer: 59

## 2023-08-23 DIAGNOSIS — M25472 Effusion, left ankle: Secondary | ICD-10-CM | POA: Diagnosis not present

## 2023-08-23 DIAGNOSIS — K449 Diaphragmatic hernia without obstruction or gangrene: Secondary | ICD-10-CM | POA: Diagnosis not present

## 2023-08-23 DIAGNOSIS — C155 Malignant neoplasm of lower third of esophagus: Secondary | ICD-10-CM | POA: Diagnosis not present

## 2023-08-23 DIAGNOSIS — K219 Gastro-esophageal reflux disease without esophagitis: Secondary | ICD-10-CM | POA: Diagnosis not present

## 2023-08-23 DIAGNOSIS — Z79899 Other long term (current) drug therapy: Secondary | ICD-10-CM | POA: Diagnosis not present

## 2023-08-23 DIAGNOSIS — R490 Dysphonia: Secondary | ICD-10-CM | POA: Diagnosis not present

## 2023-08-23 DIAGNOSIS — R110 Nausea: Secondary | ICD-10-CM | POA: Diagnosis not present

## 2023-08-23 DIAGNOSIS — I10 Essential (primary) hypertension: Secondary | ICD-10-CM | POA: Diagnosis not present

## 2023-08-23 DIAGNOSIS — R079 Chest pain, unspecified: Secondary | ICD-10-CM | POA: Diagnosis not present

## 2023-08-23 DIAGNOSIS — Z510 Encounter for antineoplastic radiation therapy: Secondary | ICD-10-CM | POA: Diagnosis not present

## 2023-08-23 DIAGNOSIS — M25471 Effusion, right ankle: Secondary | ICD-10-CM | POA: Diagnosis not present

## 2023-08-23 DIAGNOSIS — Z87891 Personal history of nicotine dependence: Secondary | ICD-10-CM | POA: Diagnosis not present

## 2023-08-23 DIAGNOSIS — J449 Chronic obstructive pulmonary disease, unspecified: Secondary | ICD-10-CM | POA: Diagnosis not present

## 2023-08-23 DIAGNOSIS — Z5111 Encounter for antineoplastic chemotherapy: Secondary | ICD-10-CM | POA: Diagnosis not present

## 2023-08-23 DIAGNOSIS — R0989 Other specified symptoms and signs involving the circulatory and respiratory systems: Secondary | ICD-10-CM | POA: Diagnosis not present

## 2023-08-23 DIAGNOSIS — R55 Syncope and collapse: Secondary | ICD-10-CM | POA: Diagnosis not present

## 2023-08-23 DIAGNOSIS — Z7951 Long term (current) use of inhaled steroids: Secondary | ICD-10-CM | POA: Diagnosis not present

## 2023-08-23 DIAGNOSIS — Z23 Encounter for immunization: Secondary | ICD-10-CM | POA: Diagnosis not present

## 2023-08-23 DIAGNOSIS — R600 Localized edema: Secondary | ICD-10-CM | POA: Diagnosis not present

## 2023-08-23 LAB — RAD ONC ARIA SESSION SUMMARY
Course Elapsed Days: 8
Plan Fractions Treated to Date: 7
Plan Prescribed Dose Per Fraction: 1.8 Gy
Plan Total Fractions Prescribed: 25
Plan Total Prescribed Dose: 45 Gy
Reference Point Dosage Given to Date: 12.6 Gy
Reference Point Session Dosage Given: 1.8 Gy
Session Number: 7

## 2023-08-24 ENCOUNTER — Ambulatory Visit
Admission: RE | Admit: 2023-08-24 | Discharge: 2023-08-24 | Disposition: A | Discharge status: Home / Self Care | Payer: 59 | Source: Ambulatory Visit | Attending: Radiation Oncology | Admitting: Radiation Oncology

## 2023-08-24 ENCOUNTER — Ambulatory Visit: Payer: 59

## 2023-08-24 ENCOUNTER — Other Ambulatory Visit: Payer: Self-pay

## 2023-08-24 DIAGNOSIS — Z87891 Personal history of nicotine dependence: Secondary | ICD-10-CM | POA: Diagnosis not present

## 2023-08-24 DIAGNOSIS — Z510 Encounter for antineoplastic radiation therapy: Secondary | ICD-10-CM | POA: Diagnosis not present

## 2023-08-24 DIAGNOSIS — R55 Syncope and collapse: Secondary | ICD-10-CM | POA: Diagnosis not present

## 2023-08-24 DIAGNOSIS — R110 Nausea: Secondary | ICD-10-CM | POA: Diagnosis not present

## 2023-08-24 DIAGNOSIS — M25472 Effusion, left ankle: Secondary | ICD-10-CM | POA: Diagnosis not present

## 2023-08-24 DIAGNOSIS — R0989 Other specified symptoms and signs involving the circulatory and respiratory systems: Secondary | ICD-10-CM | POA: Diagnosis not present

## 2023-08-24 DIAGNOSIS — M25471 Effusion, right ankle: Secondary | ICD-10-CM | POA: Diagnosis not present

## 2023-08-24 DIAGNOSIS — Z7951 Long term (current) use of inhaled steroids: Secondary | ICD-10-CM | POA: Diagnosis not present

## 2023-08-24 DIAGNOSIS — R490 Dysphonia: Secondary | ICD-10-CM | POA: Diagnosis not present

## 2023-08-24 DIAGNOSIS — K449 Diaphragmatic hernia without obstruction or gangrene: Secondary | ICD-10-CM | POA: Diagnosis not present

## 2023-08-24 DIAGNOSIS — C155 Malignant neoplasm of lower third of esophagus: Secondary | ICD-10-CM | POA: Diagnosis not present

## 2023-08-24 DIAGNOSIS — K219 Gastro-esophageal reflux disease without esophagitis: Secondary | ICD-10-CM | POA: Diagnosis not present

## 2023-08-24 DIAGNOSIS — Z5111 Encounter for antineoplastic chemotherapy: Secondary | ICD-10-CM | POA: Diagnosis not present

## 2023-08-24 DIAGNOSIS — J449 Chronic obstructive pulmonary disease, unspecified: Secondary | ICD-10-CM | POA: Diagnosis not present

## 2023-08-24 DIAGNOSIS — Z23 Encounter for immunization: Secondary | ICD-10-CM | POA: Diagnosis not present

## 2023-08-24 DIAGNOSIS — R600 Localized edema: Secondary | ICD-10-CM | POA: Diagnosis not present

## 2023-08-24 DIAGNOSIS — R079 Chest pain, unspecified: Secondary | ICD-10-CM | POA: Diagnosis not present

## 2023-08-24 DIAGNOSIS — I10 Essential (primary) hypertension: Secondary | ICD-10-CM | POA: Diagnosis not present

## 2023-08-24 DIAGNOSIS — Z79899 Other long term (current) drug therapy: Secondary | ICD-10-CM | POA: Diagnosis not present

## 2023-08-24 LAB — RAD ONC ARIA SESSION SUMMARY
Course Elapsed Days: 9
Plan Fractions Treated to Date: 8
Plan Prescribed Dose Per Fraction: 1.8 Gy
Plan Total Fractions Prescribed: 25
Plan Total Prescribed Dose: 45 Gy
Reference Point Dosage Given to Date: 14.4 Gy
Reference Point Session Dosage Given: 1.8 Gy
Session Number: 8

## 2023-08-25 ENCOUNTER — Other Ambulatory Visit: Payer: Self-pay

## 2023-08-25 ENCOUNTER — Ambulatory Visit
Admission: RE | Admit: 2023-08-25 | Discharge: 2023-08-25 | Disposition: A | Discharge status: Home / Self Care | Payer: 59 | Source: Ambulatory Visit | Attending: Radiation Oncology | Admitting: Radiation Oncology

## 2023-08-25 ENCOUNTER — Ambulatory Visit: Payer: 59

## 2023-08-25 DIAGNOSIS — K219 Gastro-esophageal reflux disease without esophagitis: Secondary | ICD-10-CM | POA: Diagnosis not present

## 2023-08-25 DIAGNOSIS — Z5111 Encounter for antineoplastic chemotherapy: Secondary | ICD-10-CM | POA: Diagnosis not present

## 2023-08-25 DIAGNOSIS — R490 Dysphonia: Secondary | ICD-10-CM | POA: Diagnosis not present

## 2023-08-25 DIAGNOSIS — R079 Chest pain, unspecified: Secondary | ICD-10-CM | POA: Diagnosis not present

## 2023-08-25 DIAGNOSIS — Z7951 Long term (current) use of inhaled steroids: Secondary | ICD-10-CM | POA: Diagnosis not present

## 2023-08-25 DIAGNOSIS — J449 Chronic obstructive pulmonary disease, unspecified: Secondary | ICD-10-CM | POA: Diagnosis not present

## 2023-08-25 DIAGNOSIS — Z79899 Other long term (current) drug therapy: Secondary | ICD-10-CM | POA: Diagnosis not present

## 2023-08-25 DIAGNOSIS — M25471 Effusion, right ankle: Secondary | ICD-10-CM | POA: Diagnosis not present

## 2023-08-25 DIAGNOSIS — R55 Syncope and collapse: Secondary | ICD-10-CM | POA: Diagnosis not present

## 2023-08-25 DIAGNOSIS — C155 Malignant neoplasm of lower third of esophagus: Secondary | ICD-10-CM | POA: Diagnosis not present

## 2023-08-25 DIAGNOSIS — Z510 Encounter for antineoplastic radiation therapy: Secondary | ICD-10-CM | POA: Diagnosis not present

## 2023-08-25 DIAGNOSIS — K449 Diaphragmatic hernia without obstruction or gangrene: Secondary | ICD-10-CM | POA: Diagnosis not present

## 2023-08-25 DIAGNOSIS — Z87891 Personal history of nicotine dependence: Secondary | ICD-10-CM | POA: Diagnosis not present

## 2023-08-25 DIAGNOSIS — R110 Nausea: Secondary | ICD-10-CM | POA: Diagnosis not present

## 2023-08-25 DIAGNOSIS — M25472 Effusion, left ankle: Secondary | ICD-10-CM | POA: Diagnosis not present

## 2023-08-25 DIAGNOSIS — R600 Localized edema: Secondary | ICD-10-CM | POA: Diagnosis not present

## 2023-08-25 DIAGNOSIS — Z23 Encounter for immunization: Secondary | ICD-10-CM | POA: Diagnosis not present

## 2023-08-25 DIAGNOSIS — R0989 Other specified symptoms and signs involving the circulatory and respiratory systems: Secondary | ICD-10-CM | POA: Diagnosis not present

## 2023-08-25 DIAGNOSIS — I10 Essential (primary) hypertension: Secondary | ICD-10-CM | POA: Diagnosis not present

## 2023-08-25 LAB — RAD ONC ARIA SESSION SUMMARY
Course Elapsed Days: 10
Plan Fractions Treated to Date: 9
Plan Prescribed Dose Per Fraction: 1.8 Gy
Plan Total Fractions Prescribed: 25
Plan Total Prescribed Dose: 45 Gy
Reference Point Dosage Given to Date: 16.2 Gy
Reference Point Session Dosage Given: 1.8 Gy
Session Number: 9

## 2023-08-25 MED FILL — Dexamethasone Sodium Phosphate Inj 100 MG/10ML: INTRAMUSCULAR | Qty: 1 | Status: AC

## 2023-08-28 ENCOUNTER — Other Ambulatory Visit: Payer: Self-pay

## 2023-08-28 ENCOUNTER — Ambulatory Visit
Admission: RE | Admit: 2023-08-28 | Discharge: 2023-08-28 | Disposition: A | Discharge status: Home / Self Care | Payer: 59 | Source: Ambulatory Visit | Attending: Radiation Oncology

## 2023-08-28 ENCOUNTER — Ambulatory Visit: Payer: 59

## 2023-08-28 ENCOUNTER — Encounter: Payer: Self-pay | Admitting: Hematology

## 2023-08-28 ENCOUNTER — Inpatient Hospital Stay: Payer: 59

## 2023-08-28 ENCOUNTER — Inpatient Hospital Stay (HOSPITAL_BASED_OUTPATIENT_CLINIC_OR_DEPARTMENT_OTHER): Payer: 59 | Admitting: Hematology

## 2023-08-28 VITALS — BP 138/82 | HR 82 | Temp 97.6°F | Resp 18 | Ht 71.0 in | Wt 172.3 lb

## 2023-08-28 DIAGNOSIS — M25472 Effusion, left ankle: Secondary | ICD-10-CM | POA: Diagnosis not present

## 2023-08-28 DIAGNOSIS — R55 Syncope and collapse: Secondary | ICD-10-CM | POA: Diagnosis not present

## 2023-08-28 DIAGNOSIS — Z510 Encounter for antineoplastic radiation therapy: Secondary | ICD-10-CM | POA: Diagnosis not present

## 2023-08-28 DIAGNOSIS — C155 Malignant neoplasm of lower third of esophagus: Secondary | ICD-10-CM | POA: Diagnosis not present

## 2023-08-28 DIAGNOSIS — Z23 Encounter for immunization: Secondary | ICD-10-CM | POA: Diagnosis not present

## 2023-08-28 DIAGNOSIS — I10 Essential (primary) hypertension: Secondary | ICD-10-CM | POA: Diagnosis not present

## 2023-08-28 DIAGNOSIS — Z7951 Long term (current) use of inhaled steroids: Secondary | ICD-10-CM | POA: Diagnosis not present

## 2023-08-28 DIAGNOSIS — M25471 Effusion, right ankle: Secondary | ICD-10-CM | POA: Diagnosis not present

## 2023-08-28 DIAGNOSIS — K449 Diaphragmatic hernia without obstruction or gangrene: Secondary | ICD-10-CM | POA: Diagnosis not present

## 2023-08-28 DIAGNOSIS — J449 Chronic obstructive pulmonary disease, unspecified: Secondary | ICD-10-CM | POA: Diagnosis not present

## 2023-08-28 DIAGNOSIS — Z5111 Encounter for antineoplastic chemotherapy: Secondary | ICD-10-CM | POA: Diagnosis not present

## 2023-08-28 DIAGNOSIS — R110 Nausea: Secondary | ICD-10-CM | POA: Diagnosis not present

## 2023-08-28 DIAGNOSIS — Z79899 Other long term (current) drug therapy: Secondary | ICD-10-CM | POA: Diagnosis not present

## 2023-08-28 DIAGNOSIS — R0989 Other specified symptoms and signs involving the circulatory and respiratory systems: Secondary | ICD-10-CM | POA: Diagnosis not present

## 2023-08-28 DIAGNOSIS — R079 Chest pain, unspecified: Secondary | ICD-10-CM | POA: Diagnosis not present

## 2023-08-28 DIAGNOSIS — R490 Dysphonia: Secondary | ICD-10-CM | POA: Diagnosis not present

## 2023-08-28 DIAGNOSIS — R600 Localized edema: Secondary | ICD-10-CM | POA: Diagnosis not present

## 2023-08-28 DIAGNOSIS — K219 Gastro-esophageal reflux disease without esophagitis: Secondary | ICD-10-CM | POA: Diagnosis not present

## 2023-08-28 DIAGNOSIS — Z87891 Personal history of nicotine dependence: Secondary | ICD-10-CM | POA: Diagnosis not present

## 2023-08-28 LAB — CMP (CANCER CENTER ONLY)
ALT: 13 U/L (ref 0–44)
AST: 15 U/L (ref 15–41)
Albumin: 3.7 g/dL (ref 3.5–5.0)
Alkaline Phosphatase: 72 U/L (ref 38–126)
Anion gap: 6 (ref 5–15)
BUN: 26 mg/dL — ABNORMAL HIGH (ref 8–23)
CO2: 28 mmol/L (ref 22–32)
Calcium: 9 mg/dL (ref 8.9–10.3)
Chloride: 103 mmol/L (ref 98–111)
Creatinine: 1.52 mg/dL — ABNORMAL HIGH (ref 0.61–1.24)
GFR, Estimated: 50 mL/min — ABNORMAL LOW
Glucose, Bld: 128 mg/dL — ABNORMAL HIGH (ref 70–99)
Potassium: 4 mmol/L (ref 3.5–5.1)
Sodium: 137 mmol/L (ref 135–145)
Total Bilirubin: 0.4 mg/dL (ref 0.3–1.2)
Total Protein: 6.7 g/dL (ref 6.5–8.1)

## 2023-08-28 LAB — RAD ONC ARIA SESSION SUMMARY
Course Elapsed Days: 13
Plan Fractions Treated to Date: 10
Plan Prescribed Dose Per Fraction: 1.8 Gy
Plan Total Fractions Prescribed: 25
Plan Total Prescribed Dose: 45 Gy
Reference Point Dosage Given to Date: 18 Gy
Reference Point Session Dosage Given: 1.8 Gy
Session Number: 10

## 2023-08-28 LAB — CBC WITH DIFFERENTIAL (CANCER CENTER ONLY)
Abs Immature Granulocytes: 0.03 10*3/uL (ref 0.00–0.07)
Basophils Absolute: 0 10*3/uL (ref 0.0–0.1)
Basophils Relative: 1 %
Eosinophils Absolute: 0.1 10*3/uL (ref 0.0–0.5)
Eosinophils Relative: 4 %
HCT: 45.1 % (ref 39.0–52.0)
Hemoglobin: 15.4 g/dL (ref 13.0–17.0)
Immature Granulocytes: 1 %
Lymphocytes Relative: 16 %
Lymphs Abs: 0.4 10*3/uL — ABNORMAL LOW (ref 0.7–4.0)
MCH: 29.9 pg (ref 26.0–34.0)
MCHC: 34.1 g/dL (ref 30.0–36.0)
MCV: 87.6 fL (ref 80.0–100.0)
Monocytes Absolute: 0.3 10*3/uL (ref 0.1–1.0)
Monocytes Relative: 12 %
Neutro Abs: 1.7 10*3/uL (ref 1.7–7.7)
Neutrophils Relative %: 66 %
Platelet Count: 195 10*3/uL (ref 150–400)
RBC: 5.15 MIL/uL (ref 4.22–5.81)
RDW: 13.2 % (ref 11.5–15.5)
WBC Count: 2.5 10*3/uL — ABNORMAL LOW (ref 4.0–10.5)
nRBC: 0 % (ref 0.0–0.2)

## 2023-08-28 MED ORDER — SODIUM CHLORIDE 0.9 % IV SOLN
10.0000 mg | Freq: Once | INTRAVENOUS | Status: AC
  Administered 2023-08-28: 10 mg via INTRAVENOUS
  Filled 2023-08-28: qty 10

## 2023-08-28 MED ORDER — SODIUM CHLORIDE 0.9 % IV SOLN
50.0000 mg/m2 | Freq: Once | INTRAVENOUS | Status: AC
  Administered 2023-08-28: 102 mg via INTRAVENOUS
  Filled 2023-08-28: qty 17

## 2023-08-28 MED ORDER — SODIUM CHLORIDE 0.9 % IV SOLN: Freq: Once | INTRAVENOUS | Status: AC

## 2023-08-28 MED ORDER — DIPHENHYDRAMINE HCL 50 MG/ML IJ SOLN
25.0000 mg | Freq: Once | INTRAMUSCULAR | Status: AC
  Administered 2023-08-28: 25 mg via INTRAVENOUS
  Filled 2023-08-28: qty 1

## 2023-08-28 MED ORDER — HEPARIN SOD (PORK) LOCK FLUSH 100 UNIT/ML IV SOLN
500.0000 [IU] | Freq: Once | INTRAVENOUS | Status: AC | PRN
  Administered 2023-08-28: 500 [IU]

## 2023-08-28 MED ORDER — SODIUM CHLORIDE 0.9 % IV SOLN
152.2000 mg | Freq: Once | INTRAVENOUS | Status: AC
  Administered 2023-08-28: 150 mg via INTRAVENOUS
  Filled 2023-08-28: qty 15

## 2023-08-28 MED ORDER — PALONOSETRON HCL INJECTION 0.25 MG/5ML
0.2500 mg | Freq: Once | INTRAVENOUS | Status: AC
  Administered 2023-08-28: 0.25 mg via INTRAVENOUS
  Filled 2023-08-28: qty 5

## 2023-08-28 MED ORDER — SODIUM CHLORIDE 0.9% FLUSH
10.0000 mL | INTRAVENOUS | Status: DC | PRN
  Administered 2023-08-28: 10 mL

## 2023-08-28 MED ORDER — FAMOTIDINE IN NACL 20-0.9 MG/50ML-% IV SOLN
20.0000 mg | Freq: Once | INTRAVENOUS | Status: AC
  Administered 2023-08-28: 20 mg via INTRAVENOUS
  Filled 2023-08-28: qty 50

## 2023-08-28 MED ORDER — GUAIFENESIN-CODEINE 100-10 MG/5ML PO SOLN
5.0000 mL | Freq: Four times a day (QID) | ORAL | 0 refills | Status: DC | PRN
Start: 2023-08-28 — End: 2023-09-05
  Filled 2023-08-28: qty 237, 12d supply, fill #0

## 2023-08-28 NOTE — Assessment & Plan Note (Signed)
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  We have reviewed his case in the tumor board, and recommend biopsy of the sternal bone lesion.   -He underwent a EUS staging, which showed 2 tumors in the distal esophagus, staged as T1smN0 and T2N0.  -His sternal bone biopsy was negative for malignant cells. -He started concurrent chemotherapy and radiation on 08/15/2023 with weekly carbo and Taxol.

## 2023-08-28 NOTE — Patient Instructions (Signed)
[facility]  Discharge Instructions: Thank you for choosing [facility] to provide your oncology and hematology care.   If you have a lab appointment with the Cancer Center, please go directly to the Cancer Center and check in at the registration area.   Wear comfortable clothing and clothing appropriate for easy access to any Portacath or PICC line.   We strive to give you quality time with your provider. You may need to reschedule your appointment if you arrive late (15 or more minutes).  Arriving late affects you and other patients whose appointments are after yours.  Also, if you miss three or more appointments without notifying the office, you may be dismissed from the clinic at the provider's discretion.      For prescription refill requests, have your pharmacy contact our office and allow 72 hours for refills to be completed.    Today you received the following chemotherapy and/or immunotherapy agents: Paclitaxel, Carboplatin.       To help prevent nausea and vomiting after your treatment, we encourage you to take your nausea medication as directed.  BELOW ARE SYMPTOMS THAT SHOULD BE REPORTED IMMEDIATELY: *FEVER GREATER THAN 100.4 F (38 C) OR HIGHER *CHILLS OR SWEATING *NAUSEA AND VOMITING THAT IS NOT CONTROLLED WITH YOUR NAUSEA MEDICATION *UNUSUAL SHORTNESS OF BREATH *UNUSUAL BRUISING OR BLEEDING *URINARY PROBLEMS (pain or burning when urinating, or frequent urination) *BOWEL PROBLEMS (unusual diarrhea, constipation, pain near the anus) TENDERNESS IN MOUTH AND THROAT WITH OR WITHOUT PRESENCE OF ULCERS (sore throat, sores in mouth, or a toothache) UNUSUAL RASH, SWELLING OR PAIN  UNUSUAL VAGINAL DISCHARGE OR ITCHING   Items with * indicate a potential emergency and should be followed up as soon as possible or go to the Emergency Department if any problems should occur.  Please show the CHEMOTHERAPY ALERT CARD or IMMUNOTHERAPY  ALERT CARD at check-in to the Emergency Department and triage nurse.  Should you have questions after your visit or need to cancel or reschedule your appointment, please contact [facility]  Dept: [phone]  and follow the prompts.  Office hours are 8:00 a.m. to 4:30 p.m. Monday - Friday. Please note that voicemails left after 4:00 p.m. may not be returned until the following business day.  We are closed weekends and major holidays. You have access to a nurse at all times for urgent questions. Please call the main number to the clinic Dept: [phone] and follow the prompts.   For any non-urgent questions, you may also contact your provider using MyChart. We now offer e-Visits for anyone 13 and older to request care online for non-urgent symptoms. For details visit [email or web address].   Also download the MyChart app! Go to the app store, search "MyChart", open the app, select [facility], and log in with your MyChart username and password.

## 2023-08-28 NOTE — Progress Notes (Signed)
[facility]   [phone] [phone]   Clinic Follow up Note   Patient Care Team: Storm Frisk, MD as PCP - General (Pulmonary Disease) Donzetta Starch, MD as Consulting Physician (Dermatology) Veryl Speak, FNP as Nurse Practitioner (Infectious Diseases) Forestine Na (Physician Assistant) Bradly Bienenstock, MD as Consulting Physician (Orthopedic Surgery) Malachy Mood, MD as Consulting Physician (Hematology and Oncology)  Date of Service:  08/28/2023  CHIEF COMPLAINT: f/u of esophageal cancer  CURRENT THERAPY:  Concurrent chemoradiation with weekly carboplatin and Taxol  Oncology History   Esophageal cancer Jordan [name]) Distal esophageal carcinoma, 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  We have reviewed his case in the tumor board, and recommend biopsy of the sternal bone lesion.   -He underwent a EUS staging, which showed 2 tumors in the distal esophagus, staged as T1smN0 and T2N0.  -His sternal bone biopsy was negative for malignant cells. -He started concurrent chemotherapy and radiation on 08/15/2023 with weekly carbo and Taxol.    Assessment and Plan    Esophageal Cancer Undergoing concurrent chemo and radiation therapy. Stable weight. Pain and cough managed with codeine cough syrup and sucralfate. -Continue current treatment plan. -Refill codeine cough syrup, patient to inquire about different flavors at pharmacy. -Continue sucralfate, patient has one refill available. -Continue radiation and chemotherapy as scheduled. -Re-evaluate with scan and possible endoscopy after completion of current treatment  course. -Consider consolidation chemotherapy after recovery from current chemo-radiation.  chest Pain -Secondary to his cough  -improved with current management. -Continue current management.  Plan -Continue concurrent chemo and radiation, will proceed cycle 3 carboplatin and Taxol today -I reviewed his guaifenesin codeine cough syrup -Follow-up in 1 week     SUMMARY OF ONCOLOGIC HISTORY: Oncology History  Esophageal cancer (HCC)  07/13/2023 Initial Diagnosis   Esophageal cancer (HCC)   08/15/2023 -  Chemotherapy   Patient is on Treatment Plan : ESOPHAGUS Carboplatin + Paclitaxel Weekly X 6 Weeks with XRT        Discussed the use of AI scribe software for clinical note transcription with the patient, who gave verbal consent to proceed.  History of Present Illness   The patient, a Jordan [name] with a history of esophageal cancer, is currently undergoing concurrent chemotherapy and radiation. He reports experiencing back pain, which has improved, and a persistent cough. The cough is alleviated by cough medicine, which also helps with the pain. The patient requests a refill of the cough medicine, expressing a preference for a non-cherry flavor. He also mentions taking sucralfate, a medication that coats the stomach, which he dissolves in water for better efficacy. The patient reports that he has been able to maintain his weight and has not had any problems swallowing. The only pain experienced when swallowing is when he coughs. He also mentions a slightly hoarse voice.         All other systems were reviewed with the patient and are negative.  MEDICAL HISTORY:  Past Medical History:  Diagnosis Date   Allergy    seasonal   Aneurysm of infrarenal abdominal aorta (HCC)    4.2 cm 07/12/23   Arthritis    Colon cancer (HCC)    Compression  fracture of thoracic vertebra with routine healing 04/30/2020   COPD (chronic obstructive pulmonary disease) (HCC)    Family history of adverse  reaction to anesthesia    mother slow to awaken   GERD (gastroesophageal reflux disease)    Hepatitis    hepatitis c dx in remission per pt   Hypertension    Inguinal hernia    Multiple fractures 06/04/2020   Positive colorectal cancer screening using Cologuard test 08/27/2020   Substance abuse (HCC)    Wears glasses    for reading    SURGICAL HISTORY: Past Surgical History:  Procedure Laterality Date   COLONOSCOPY  11/11/2020   ENDOSCOPIC MUCOSAL RESECTION N/A 03/10/2021   Procedure: ENDOSCOPIC MUCOSAL RESECTION;  Surgeon: Lemar Lofty., MD;  Location: [facility];  Service: Gastroenterology;  Laterality: N/A;   ESOPHAGOGASTRODUODENOSCOPY (EGD) WITH PROPOFOL N/A 07/27/2023   Procedure: ESOPHAGOGASTRODUODENOSCOPY (EGD) WITH PROPOFOL;  Surgeon: Meridee Score Netty Starring., MD;  Location: [facility];  Service: Gastroenterology;  Laterality: N/A;   EUS N/A 07/27/2023   Procedure: UPPER ENDOSCOPIC ULTRASOUND (EUS) RADIAL;  Surgeon: Lemar Lofty., MD;  Location: [facility];  Service: Gastroenterology;  Laterality: N/A;   FLEXIBLE SIGMOIDOSCOPY N/A 03/10/2021   Procedure: FLEXIBLE SIGMOIDOSCOPY;  Surgeon: Meridee Score Netty Starring., MD;  Location: [facility];  Service: Gastroenterology;  Laterality: N/A;   HEMOSTASIS CLIP PLACEMENT  03/10/2021   Procedure: HEMOSTASIS CLIP PLACEMENT;  Surgeon: Lemar Lofty., MD;  Location: [facility];  Service: Gastroenterology;;   INGUINAL HERNIA REPAIR Right 01/19/2021   Procedure: LAPAROSCOPIC RIGHT INGUINAL HERNIA REPAIR WITH MESH;  Surgeon: Stechschulte, Hyman Hopes, MD;  Location: [facility];  Service: General;  Laterality: Right;   IR IMAGING GUIDED PORT INSERTION  08/17/2023   obstruction removed as a child   swallowed a peanut   POLYPECTOMY  03/10/2021   Procedure: POLYPECTOMY;  Surgeon: Mansouraty, Netty Starring., MD;  Location: [facility];  Service: Gastroenterology;;   POLYPECTOMY     SUBMUCOSAL LIFTING  INJECTION  03/10/2021   Procedure: SUBMUCOSAL LIFTING INJECTION;  Surgeon: Lemar Lofty., MD;  Location: [facility];  Service: Gastroenterology;;    I have reviewed the social history and family history with the patient and they are unchanged from previous note.  ALLERGIES:  has No Known Allergies.  MEDICATIONS:  Current Outpatient Medications  Medication Sig Dispense Refill   albuterol (VENTOLIN HFA) 108 (90 Base) MCG/ACT inhaler Inhale 2 puffs into the lungs every 6 (six) hours as needed for wheezing or shortness of breath. 18 g 2   amLODipine (NORVASC) 10 MG tablet Take 1 tablet (10 mg total) by mouth daily. 90 tablet 2   fluticasone-salmeterol (ADVAIR HFA) 115-21 MCG/ACT inhaler Inhale 2 puffs into the lungs 2 (two) times daily. 12 g 12   guaiFENesin-codeine 100-10 MG/5ML syrup Take 5 mLs by mouth every 6 (six) hours as needed for cough. 237 mL 0   lidocaine-prilocaine (EMLA) cream Apply 1 Application topically as needed. 30 g 0   Multiple Vitamin (MULTIVITAMIN ADULT PO) Take by mouth.     omeprazole (PRILOSEC) 40 MG capsule Take 1 capsule (40 mg total) by mouth daily before dinner. 60 capsule 3   ondansetron (ZOFRAN) 8 MG tablet Take 1 tablet (8 mg total) by mouth every 8 (eight) hours as needed for nausea or vomiting. Start on the third day after chemotherapy. 30 tablet 1   prochlorperazine (COMPAZINE) 10 MG tablet Take 1 tablet (10 mg total) by mouth every 6 (six) hours as needed for nausea  or vomiting. 30 tablet 1   promethazine-dextromethorphan (PROMETHAZINE-DM) 6.25-15 MG/5ML syrup Take 5 mLs by mouth 4 (four) times daily as needed for cough. 240 mL 1   sildenafil (VIAGRA) 100 MG tablet Take 1 tablet (100 mg total) by mouth daily as needed for erectile dysfunction. 10 tablet 3   sucralfate (CARAFATE) 1 g tablet Take 1 tablet (1 g total) by mouth 4 (four) times daily -  with meals and at bedtime. 60 tablet 1   No current facility-administered medications for this visit.    Facility-Administered Medications Ordered in Other Visits  Medication Dose Route Frequency Provider Last Rate Last Admin   CARBOplatin (PARAPLATIN) 150 mg in sodium chloride 0.9 % 100 mL chemo infusion  150 mg Intravenous Once Malachy Mood, MD 230 mL/hr at 08/28/23 1256 150 mg at 08/28/23 1256   heparin lock flush 100 unit/mL  500 Units Intracatheter Once PRN Malachy Mood, MD       sodium chloride flush (NS) 0.9 % injection 10 mL  10 mL Intracatheter PRN Malachy Mood, MD        PHYSICAL EXAMINATION: ECOG PERFORMANCE STATUS: 1 - Symptomatic but completely ambulatory  Vitals:   08/28/23 0919  BP: 138/82  Pulse: 82  Resp: 18  Temp: 97.6 F (36.4 C)  SpO2: 96%   Wt Readings from Last 3 Encounters:  08/28/23 172 lb 4.8 oz (78.2 kg)  08/22/23 170 lb 8 oz (77.3 kg)  08/17/23 172 lb (78 kg)     GENERAL:alert, no distress and comfortable SKIN: skin color, texture, turgor are normal, no rashes or significant lesions EYES: normal, Conjunctiva are pink and non-injected, sclera clear NECK: supple, thyroid normal size, non-tender, without nodularity LYMPH:  no palpable lymphadenopathy in the cervical, axillary  LUNGS: clear to auscultation and percussion with normal breathing effort HEART: regular rate & rhythm and no murmurs and no lower extremity edema ABDOMEN:abdomen soft, non-tender and normal bowel sounds Musculoskeletal:no cyanosis of digits and no clubbing  NEURO: alert & oriented x 3 with fluent speech, no focal motor/sensory deficits   LABORATORY DATA:  I have reviewed the data as listed    Latest Ref Rng & Units 08/28/2023    8:46 AM 08/21/2023    3:00 PM 08/14/2023    3:26 PM  CBC  WBC 4.0 - 10.5 K/uL 2.5  5.9  6.9   Hemoglobin 13.0 - 17.0 g/dL 57.8  46.9  62.9   Hematocrit 39.0 - 52.0 % 45.1  49.5  48.3   Platelets 150 - 400 K/uL 195  217  249         Latest Ref Rng & Units 08/28/2023    8:46 AM 08/21/2023    3:00 PM 08/14/2023    3:26 PM  CMP  Glucose 70 - 99 mg/dL 528   413  244   BUN 8 - 23 mg/dL 26  28  34   Creatinine 0.61 - 1.24 mg/dL 0.10  2.72  5.36   Sodium 135 - 145 mmol/L 137  136  136   Potassium 3.5 - 5.1 mmol/L 4.0  4.8  4.3   Chloride 98 - 111 mmol/L 103  100  104   CO2 22 - 32 mmol/L 28  30  24    Calcium 8.9 - 10.3 mg/dL 9.0  9.4  9.4   Total Protein 6.5 - 8.1 g/dL 6.7  7.0  7.1   Total Bilirubin 0.3 - 1.2 mg/dL 0.4  0.5  0.5   Alkaline Phos 38 -  126 U/L 72  81  83   AST 15 - 41 U/L 15  14  15    ALT 0 - 44 U/L 13  13  17        RADIOGRAPHIC STUDIES: I have personally reviewed the radiological images as listed and agreed with the findings in the report. No results found.    No orders of the defined types were placed in this encounter.  All questions were answered. The patient knows to call the clinic with any problems, questions or concerns. No barriers to learning was detected. The total time spent in the appointment was 25 minutes.     Malachy Mood, MD 08/28/2023

## 2023-08-29 ENCOUNTER — Ambulatory Visit
Admission: RE | Admit: 2023-08-29 | Discharge: 2023-08-29 | Disposition: A | Discharge status: Home / Self Care | Payer: 59 | Source: Ambulatory Visit | Attending: Radiation Oncology | Admitting: Radiation Oncology

## 2023-08-29 ENCOUNTER — Other Ambulatory Visit: Payer: Self-pay

## 2023-08-29 DIAGNOSIS — Z5111 Encounter for antineoplastic chemotherapy: Secondary | ICD-10-CM | POA: Diagnosis not present

## 2023-08-29 DIAGNOSIS — M25471 Effusion, right ankle: Secondary | ICD-10-CM | POA: Diagnosis not present

## 2023-08-29 DIAGNOSIS — Z87891 Personal history of nicotine dependence: Secondary | ICD-10-CM | POA: Diagnosis not present

## 2023-08-29 DIAGNOSIS — Z510 Encounter for antineoplastic radiation therapy: Secondary | ICD-10-CM | POA: Diagnosis not present

## 2023-08-29 DIAGNOSIS — R110 Nausea: Secondary | ICD-10-CM | POA: Diagnosis not present

## 2023-08-29 DIAGNOSIS — Z79899 Other long term (current) drug therapy: Secondary | ICD-10-CM | POA: Diagnosis not present

## 2023-08-29 DIAGNOSIS — J449 Chronic obstructive pulmonary disease, unspecified: Secondary | ICD-10-CM | POA: Diagnosis not present

## 2023-08-29 DIAGNOSIS — M25472 Effusion, left ankle: Secondary | ICD-10-CM | POA: Diagnosis not present

## 2023-08-29 DIAGNOSIS — C155 Malignant neoplasm of lower third of esophagus: Secondary | ICD-10-CM | POA: Diagnosis not present

## 2023-08-29 DIAGNOSIS — R490 Dysphonia: Secondary | ICD-10-CM | POA: Diagnosis not present

## 2023-08-29 DIAGNOSIS — Z7951 Long term (current) use of inhaled steroids: Secondary | ICD-10-CM | POA: Diagnosis not present

## 2023-08-29 DIAGNOSIS — Z23 Encounter for immunization: Secondary | ICD-10-CM | POA: Diagnosis not present

## 2023-08-29 DIAGNOSIS — I10 Essential (primary) hypertension: Secondary | ICD-10-CM | POA: Diagnosis not present

## 2023-08-29 DIAGNOSIS — R600 Localized edema: Secondary | ICD-10-CM | POA: Diagnosis not present

## 2023-08-29 DIAGNOSIS — R55 Syncope and collapse: Secondary | ICD-10-CM | POA: Diagnosis not present

## 2023-08-29 DIAGNOSIS — R079 Chest pain, unspecified: Secondary | ICD-10-CM | POA: Diagnosis not present

## 2023-08-29 DIAGNOSIS — K219 Gastro-esophageal reflux disease without esophagitis: Secondary | ICD-10-CM | POA: Diagnosis not present

## 2023-08-29 DIAGNOSIS — K449 Diaphragmatic hernia without obstruction or gangrene: Secondary | ICD-10-CM | POA: Diagnosis not present

## 2023-08-29 DIAGNOSIS — R0989 Other specified symptoms and signs involving the circulatory and respiratory systems: Secondary | ICD-10-CM | POA: Diagnosis not present

## 2023-08-29 LAB — RAD ONC ARIA SESSION SUMMARY
Course Elapsed Days: 14
Plan Fractions Treated to Date: 11
Plan Prescribed Dose Per Fraction: 1.8 Gy
Plan Total Fractions Prescribed: 25
Plan Total Prescribed Dose: 45 Gy
Reference Point Dosage Given to Date: 19.8 Gy
Reference Point Session Dosage Given: 1.8 Gy
Session Number: 11

## 2023-08-30 ENCOUNTER — Ambulatory Visit
Admission: RE | Admit: 2023-08-30 | Discharge: 2023-08-30 | Disposition: A | Discharge status: Home / Self Care | Payer: 59 | Source: Ambulatory Visit | Attending: Radiation Oncology

## 2023-08-30 ENCOUNTER — Other Ambulatory Visit: Payer: Self-pay

## 2023-08-30 DIAGNOSIS — J449 Chronic obstructive pulmonary disease, unspecified: Secondary | ICD-10-CM | POA: Diagnosis not present

## 2023-08-30 DIAGNOSIS — Z510 Encounter for antineoplastic radiation therapy: Secondary | ICD-10-CM | POA: Diagnosis not present

## 2023-08-30 DIAGNOSIS — M25471 Effusion, right ankle: Secondary | ICD-10-CM | POA: Diagnosis not present

## 2023-08-30 DIAGNOSIS — R55 Syncope and collapse: Secondary | ICD-10-CM | POA: Diagnosis not present

## 2023-08-30 DIAGNOSIS — R600 Localized edema: Secondary | ICD-10-CM | POA: Diagnosis not present

## 2023-08-30 DIAGNOSIS — R079 Chest pain, unspecified: Secondary | ICD-10-CM | POA: Diagnosis not present

## 2023-08-30 DIAGNOSIS — R110 Nausea: Secondary | ICD-10-CM | POA: Diagnosis not present

## 2023-08-30 DIAGNOSIS — Z5111 Encounter for antineoplastic chemotherapy: Secondary | ICD-10-CM | POA: Diagnosis not present

## 2023-08-30 DIAGNOSIS — K449 Diaphragmatic hernia without obstruction or gangrene: Secondary | ICD-10-CM | POA: Diagnosis not present

## 2023-08-30 DIAGNOSIS — Z23 Encounter for immunization: Secondary | ICD-10-CM | POA: Diagnosis not present

## 2023-08-30 DIAGNOSIS — R0989 Other specified symptoms and signs involving the circulatory and respiratory systems: Secondary | ICD-10-CM | POA: Diagnosis not present

## 2023-08-30 DIAGNOSIS — R490 Dysphonia: Secondary | ICD-10-CM | POA: Diagnosis not present

## 2023-08-30 DIAGNOSIS — I10 Essential (primary) hypertension: Secondary | ICD-10-CM | POA: Diagnosis not present

## 2023-08-30 DIAGNOSIS — Z87891 Personal history of nicotine dependence: Secondary | ICD-10-CM | POA: Diagnosis not present

## 2023-08-30 DIAGNOSIS — K219 Gastro-esophageal reflux disease without esophagitis: Secondary | ICD-10-CM | POA: Diagnosis not present

## 2023-08-30 DIAGNOSIS — Z7951 Long term (current) use of inhaled steroids: Secondary | ICD-10-CM | POA: Diagnosis not present

## 2023-08-30 DIAGNOSIS — C155 Malignant neoplasm of lower third of esophagus: Secondary | ICD-10-CM | POA: Diagnosis not present

## 2023-08-30 DIAGNOSIS — Z79899 Other long term (current) drug therapy: Secondary | ICD-10-CM | POA: Diagnosis not present

## 2023-08-30 DIAGNOSIS — M25472 Effusion, left ankle: Secondary | ICD-10-CM | POA: Diagnosis not present

## 2023-08-30 LAB — RAD ONC ARIA SESSION SUMMARY
Course Elapsed Days: 15
Plan Fractions Treated to Date: 12
Plan Prescribed Dose Per Fraction: 1.8 Gy
Plan Total Fractions Prescribed: 25
Plan Total Prescribed Dose: 45 Gy
Reference Point Dosage Given to Date: 21.6 Gy
Reference Point Session Dosage Given: 1.8 Gy
Session Number: 12

## 2023-08-31 ENCOUNTER — Other Ambulatory Visit: Payer: Self-pay

## 2023-08-31 ENCOUNTER — Ambulatory Visit
Admission: RE | Admit: 2023-08-31 | Discharge: 2023-08-31 | Disposition: A | Discharge status: Home / Self Care | Payer: 59 | Source: Ambulatory Visit | Attending: Radiation Oncology | Admitting: Radiation Oncology

## 2023-08-31 DIAGNOSIS — Z7951 Long term (current) use of inhaled steroids: Secondary | ICD-10-CM | POA: Diagnosis not present

## 2023-08-31 DIAGNOSIS — I10 Essential (primary) hypertension: Secondary | ICD-10-CM | POA: Diagnosis not present

## 2023-08-31 DIAGNOSIS — Z510 Encounter for antineoplastic radiation therapy: Secondary | ICD-10-CM | POA: Diagnosis not present

## 2023-08-31 DIAGNOSIS — R490 Dysphonia: Secondary | ICD-10-CM | POA: Diagnosis not present

## 2023-08-31 DIAGNOSIS — Z79899 Other long term (current) drug therapy: Secondary | ICD-10-CM | POA: Diagnosis not present

## 2023-08-31 DIAGNOSIS — Z87891 Personal history of nicotine dependence: Secondary | ICD-10-CM | POA: Diagnosis not present

## 2023-08-31 DIAGNOSIS — K449 Diaphragmatic hernia without obstruction or gangrene: Secondary | ICD-10-CM | POA: Diagnosis not present

## 2023-08-31 DIAGNOSIS — M25472 Effusion, left ankle: Secondary | ICD-10-CM | POA: Diagnosis not present

## 2023-08-31 DIAGNOSIS — K219 Gastro-esophageal reflux disease without esophagitis: Secondary | ICD-10-CM | POA: Diagnosis not present

## 2023-08-31 DIAGNOSIS — R600 Localized edema: Secondary | ICD-10-CM | POA: Diagnosis not present

## 2023-08-31 DIAGNOSIS — Z5111 Encounter for antineoplastic chemotherapy: Secondary | ICD-10-CM | POA: Diagnosis not present

## 2023-08-31 DIAGNOSIS — R079 Chest pain, unspecified: Secondary | ICD-10-CM | POA: Diagnosis not present

## 2023-08-31 DIAGNOSIS — R0989 Other specified symptoms and signs involving the circulatory and respiratory systems: Secondary | ICD-10-CM | POA: Diagnosis not present

## 2023-08-31 DIAGNOSIS — J449 Chronic obstructive pulmonary disease, unspecified: Secondary | ICD-10-CM | POA: Diagnosis not present

## 2023-08-31 DIAGNOSIS — C155 Malignant neoplasm of lower third of esophagus: Secondary | ICD-10-CM | POA: Diagnosis not present

## 2023-08-31 DIAGNOSIS — R55 Syncope and collapse: Secondary | ICD-10-CM | POA: Diagnosis not present

## 2023-08-31 DIAGNOSIS — Z23 Encounter for immunization: Secondary | ICD-10-CM | POA: Diagnosis not present

## 2023-08-31 DIAGNOSIS — R110 Nausea: Secondary | ICD-10-CM | POA: Diagnosis not present

## 2023-08-31 DIAGNOSIS — M25471 Effusion, right ankle: Secondary | ICD-10-CM | POA: Diagnosis not present

## 2023-08-31 LAB — RAD ONC ARIA SESSION SUMMARY
Course Elapsed Days: 16
Plan Fractions Treated to Date: 13
Plan Prescribed Dose Per Fraction: 1.8 Gy
Plan Total Fractions Prescribed: 25
Plan Total Prescribed Dose: 45 Gy
Reference Point Dosage Given to Date: 23.4 Gy
Reference Point Session Dosage Given: 1.8 Gy
Session Number: 13

## 2023-09-01 ENCOUNTER — Ambulatory Visit
Admission: RE | Admit: 2023-09-01 | Discharge: 2023-09-01 | Disposition: A | Discharge status: Home / Self Care | Payer: 59 | Source: Ambulatory Visit | Attending: Radiation Oncology | Admitting: Radiation Oncology

## 2023-09-01 ENCOUNTER — Ambulatory Visit: Payer: 59

## 2023-09-01 ENCOUNTER — Telehealth: Payer: 59 | Admitting: Thoracic Surgery (Cardiothoracic Vascular Surgery)

## 2023-09-01 ENCOUNTER — Other Ambulatory Visit: Payer: Self-pay

## 2023-09-01 DIAGNOSIS — Z510 Encounter for antineoplastic radiation therapy: Secondary | ICD-10-CM | POA: Diagnosis not present

## 2023-09-01 DIAGNOSIS — C155 Malignant neoplasm of lower third of esophagus: Secondary | ICD-10-CM | POA: Diagnosis not present

## 2023-09-01 DIAGNOSIS — R55 Syncope and collapse: Secondary | ICD-10-CM | POA: Diagnosis not present

## 2023-09-01 DIAGNOSIS — Z5111 Encounter for antineoplastic chemotherapy: Secondary | ICD-10-CM | POA: Diagnosis not present

## 2023-09-01 DIAGNOSIS — R490 Dysphonia: Secondary | ICD-10-CM | POA: Diagnosis not present

## 2023-09-01 DIAGNOSIS — R0989 Other specified symptoms and signs involving the circulatory and respiratory systems: Secondary | ICD-10-CM | POA: Diagnosis not present

## 2023-09-01 DIAGNOSIS — Z87891 Personal history of nicotine dependence: Secondary | ICD-10-CM | POA: Diagnosis not present

## 2023-09-01 DIAGNOSIS — M25471 Effusion, right ankle: Secondary | ICD-10-CM | POA: Diagnosis not present

## 2023-09-01 DIAGNOSIS — R600 Localized edema: Secondary | ICD-10-CM | POA: Diagnosis not present

## 2023-09-01 DIAGNOSIS — M25472 Effusion, left ankle: Secondary | ICD-10-CM | POA: Diagnosis not present

## 2023-09-01 DIAGNOSIS — I10 Essential (primary) hypertension: Secondary | ICD-10-CM | POA: Diagnosis not present

## 2023-09-01 DIAGNOSIS — Z79899 Other long term (current) drug therapy: Secondary | ICD-10-CM | POA: Diagnosis not present

## 2023-09-01 DIAGNOSIS — K219 Gastro-esophageal reflux disease without esophagitis: Secondary | ICD-10-CM | POA: Diagnosis not present

## 2023-09-01 DIAGNOSIS — K449 Diaphragmatic hernia without obstruction or gangrene: Secondary | ICD-10-CM | POA: Diagnosis not present

## 2023-09-01 DIAGNOSIS — R110 Nausea: Secondary | ICD-10-CM | POA: Diagnosis not present

## 2023-09-01 DIAGNOSIS — Z23 Encounter for immunization: Secondary | ICD-10-CM | POA: Diagnosis not present

## 2023-09-01 DIAGNOSIS — R079 Chest pain, unspecified: Secondary | ICD-10-CM | POA: Diagnosis not present

## 2023-09-01 DIAGNOSIS — J449 Chronic obstructive pulmonary disease, unspecified: Secondary | ICD-10-CM | POA: Diagnosis not present

## 2023-09-01 DIAGNOSIS — Z7951 Long term (current) use of inhaled steroids: Secondary | ICD-10-CM | POA: Diagnosis not present

## 2023-09-01 LAB — RAD ONC ARIA SESSION SUMMARY
Course Elapsed Days: 17
Plan Fractions Treated to Date: 14
Plan Prescribed Dose Per Fraction: 1.8 Gy
Plan Total Fractions Prescribed: 25
Plan Total Prescribed Dose: 45 Gy
Reference Point Dosage Given to Date: 25.2 Gy
Reference Point Session Dosage Given: 1.8 Gy
Session Number: 14

## 2023-09-04 ENCOUNTER — Ambulatory Visit
Admission: RE | Admit: 2023-09-04 | Discharge: 2023-09-04 | Disposition: A | Discharge status: Home / Self Care | Payer: 59 | Source: Ambulatory Visit | Attending: Radiation Oncology | Admitting: Radiation Oncology

## 2023-09-04 ENCOUNTER — Ambulatory Visit: Payer: 59

## 2023-09-04 ENCOUNTER — Other Ambulatory Visit: Payer: Self-pay

## 2023-09-04 DIAGNOSIS — Z79899 Other long term (current) drug therapy: Secondary | ICD-10-CM | POA: Diagnosis not present

## 2023-09-04 DIAGNOSIS — R079 Chest pain, unspecified: Secondary | ICD-10-CM | POA: Diagnosis not present

## 2023-09-04 DIAGNOSIS — R490 Dysphonia: Secondary | ICD-10-CM | POA: Diagnosis not present

## 2023-09-04 DIAGNOSIS — Z510 Encounter for antineoplastic radiation therapy: Secondary | ICD-10-CM | POA: Diagnosis not present

## 2023-09-04 DIAGNOSIS — R55 Syncope and collapse: Secondary | ICD-10-CM | POA: Diagnosis not present

## 2023-09-04 DIAGNOSIS — R110 Nausea: Secondary | ICD-10-CM | POA: Diagnosis not present

## 2023-09-04 DIAGNOSIS — R600 Localized edema: Secondary | ICD-10-CM | POA: Diagnosis not present

## 2023-09-04 DIAGNOSIS — M25471 Effusion, right ankle: Secondary | ICD-10-CM | POA: Diagnosis not present

## 2023-09-04 DIAGNOSIS — Z5111 Encounter for antineoplastic chemotherapy: Secondary | ICD-10-CM | POA: Diagnosis not present

## 2023-09-04 DIAGNOSIS — Z87891 Personal history of nicotine dependence: Secondary | ICD-10-CM | POA: Diagnosis not present

## 2023-09-04 DIAGNOSIS — K449 Diaphragmatic hernia without obstruction or gangrene: Secondary | ICD-10-CM | POA: Diagnosis not present

## 2023-09-04 DIAGNOSIS — R0989 Other specified symptoms and signs involving the circulatory and respiratory systems: Secondary | ICD-10-CM | POA: Diagnosis not present

## 2023-09-04 DIAGNOSIS — Z23 Encounter for immunization: Secondary | ICD-10-CM | POA: Diagnosis not present

## 2023-09-04 DIAGNOSIS — K219 Gastro-esophageal reflux disease without esophagitis: Secondary | ICD-10-CM | POA: Diagnosis not present

## 2023-09-04 DIAGNOSIS — M25472 Effusion, left ankle: Secondary | ICD-10-CM | POA: Diagnosis not present

## 2023-09-04 DIAGNOSIS — Z7951 Long term (current) use of inhaled steroids: Secondary | ICD-10-CM | POA: Diagnosis not present

## 2023-09-04 DIAGNOSIS — J449 Chronic obstructive pulmonary disease, unspecified: Secondary | ICD-10-CM | POA: Diagnosis not present

## 2023-09-04 DIAGNOSIS — C155 Malignant neoplasm of lower third of esophagus: Secondary | ICD-10-CM | POA: Diagnosis not present

## 2023-09-04 DIAGNOSIS — I10 Essential (primary) hypertension: Secondary | ICD-10-CM | POA: Diagnosis not present

## 2023-09-04 LAB — RAD ONC ARIA SESSION SUMMARY
Course Elapsed Days: 20
Plan Fractions Treated to Date: 15
Plan Prescribed Dose Per Fraction: 1.8 Gy
Plan Total Fractions Prescribed: 25
Plan Total Prescribed Dose: 45 Gy
Reference Point Dosage Given to Date: 27 Gy
Reference Point Session Dosage Given: 1.8 Gy
Session Number: 15

## 2023-09-04 NOTE — Assessment & Plan Note (Signed)
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  We have reviewed his case in the tumor board, and recommend biopsy of the sternal bone lesion.   -He underwent a EUS staging, which showed 2 tumors in the distal esophagus, staged as T1smN0 and T2N0.  -His sternal bone biopsy was negative for malignant cells. -He started concurrent chemotherapy and radiation on 08/15/2023 with weekly carbo and Taxol.

## 2023-09-05 ENCOUNTER — Inpatient Hospital Stay: Payer: 59

## 2023-09-05 ENCOUNTER — Inpatient Hospital Stay (HOSPITAL_BASED_OUTPATIENT_CLINIC_OR_DEPARTMENT_OTHER): Payer: 59 | Admitting: Hematology

## 2023-09-05 ENCOUNTER — Other Ambulatory Visit: Payer: Self-pay

## 2023-09-05 ENCOUNTER — Encounter: Payer: Self-pay | Admitting: Hematology

## 2023-09-05 ENCOUNTER — Ambulatory Visit
Admission: RE | Admit: 2023-09-05 | Discharge: 2023-09-05 | Disposition: A | Discharge status: Home / Self Care | Payer: 59 | Source: Ambulatory Visit | Attending: Radiation Oncology

## 2023-09-05 VITALS — BP 132/71 | HR 73 | Temp 98.0°F | Resp 18 | Ht 71.0 in | Wt 175.0 lb

## 2023-09-05 VITALS — BP 155/90 | HR 78 | Temp 98.7°F | Resp 20

## 2023-09-05 DIAGNOSIS — M25471 Effusion, right ankle: Secondary | ICD-10-CM | POA: Diagnosis not present

## 2023-09-05 DIAGNOSIS — C155 Malignant neoplasm of lower third of esophagus: Secondary | ICD-10-CM

## 2023-09-05 DIAGNOSIS — Z7951 Long term (current) use of inhaled steroids: Secondary | ICD-10-CM | POA: Diagnosis not present

## 2023-09-05 DIAGNOSIS — R55 Syncope and collapse: Secondary | ICD-10-CM | POA: Diagnosis not present

## 2023-09-05 DIAGNOSIS — K449 Diaphragmatic hernia without obstruction or gangrene: Secondary | ICD-10-CM | POA: Diagnosis not present

## 2023-09-05 DIAGNOSIS — R600 Localized edema: Secondary | ICD-10-CM | POA: Diagnosis not present

## 2023-09-05 DIAGNOSIS — K219 Gastro-esophageal reflux disease without esophagitis: Secondary | ICD-10-CM | POA: Diagnosis not present

## 2023-09-05 DIAGNOSIS — Z23 Encounter for immunization: Secondary | ICD-10-CM | POA: Diagnosis not present

## 2023-09-05 DIAGNOSIS — Z95828 Presence of other vascular implants and grafts: Secondary | ICD-10-CM | POA: Insufficient documentation

## 2023-09-05 DIAGNOSIS — Z5111 Encounter for antineoplastic chemotherapy: Secondary | ICD-10-CM | POA: Diagnosis not present

## 2023-09-05 DIAGNOSIS — M25472 Effusion, left ankle: Secondary | ICD-10-CM | POA: Diagnosis not present

## 2023-09-05 DIAGNOSIS — Z87891 Personal history of nicotine dependence: Secondary | ICD-10-CM | POA: Diagnosis not present

## 2023-09-05 DIAGNOSIS — Z79899 Other long term (current) drug therapy: Secondary | ICD-10-CM | POA: Diagnosis not present

## 2023-09-05 DIAGNOSIS — R490 Dysphonia: Secondary | ICD-10-CM | POA: Diagnosis not present

## 2023-09-05 DIAGNOSIS — J449 Chronic obstructive pulmonary disease, unspecified: Secondary | ICD-10-CM | POA: Diagnosis not present

## 2023-09-05 DIAGNOSIS — C189 Malignant neoplasm of colon, unspecified: Secondary | ICD-10-CM

## 2023-09-05 DIAGNOSIS — R110 Nausea: Secondary | ICD-10-CM | POA: Diagnosis not present

## 2023-09-05 DIAGNOSIS — Z510 Encounter for antineoplastic radiation therapy: Secondary | ICD-10-CM | POA: Diagnosis not present

## 2023-09-05 DIAGNOSIS — R0989 Other specified symptoms and signs involving the circulatory and respiratory systems: Secondary | ICD-10-CM | POA: Diagnosis not present

## 2023-09-05 DIAGNOSIS — I10 Essential (primary) hypertension: Secondary | ICD-10-CM | POA: Diagnosis not present

## 2023-09-05 DIAGNOSIS — R079 Chest pain, unspecified: Secondary | ICD-10-CM | POA: Diagnosis not present

## 2023-09-05 LAB — CMP (CANCER CENTER ONLY)
ALT: 14 U/L (ref 0–44)
AST: 15 U/L (ref 15–41)
Albumin: 3.6 g/dL (ref 3.5–5.0)
Alkaline Phosphatase: 69 U/L (ref 38–126)
Anion gap: 5 (ref 5–15)
BUN: 17 mg/dL (ref 8–23)
CO2: 27 mmol/L (ref 22–32)
Calcium: 8.9 mg/dL (ref 8.9–10.3)
Chloride: 107 mmol/L (ref 98–111)
Creatinine: 1.23 mg/dL (ref 0.61–1.24)
GFR, Estimated: >60 mL/min (ref >60)
Glucose, Bld: 127 mg/dL — ABNORMAL HIGH (ref 70–99)
Potassium: 4.1 mmol/L (ref 3.5–5.1)
Sodium: 139 mmol/L (ref 135–145)
Total Bilirubin: 0.3 mg/dL (ref 0.3–1.2)
Total Protein: 6.4 g/dL — ABNORMAL LOW (ref 6.5–8.1)

## 2023-09-05 LAB — CBC WITH DIFFERENTIAL (CANCER CENTER ONLY)
Abs Immature Granulocytes: 0.01 10*3/uL (ref 0.00–0.07)
Basophils Absolute: 0 10*3/uL (ref 0.0–0.1)
Basophils Relative: 2 %
Eosinophils Absolute: 0.1 10*3/uL (ref 0.0–0.5)
Eosinophils Relative: 3 %
HCT: 43 % (ref 39.0–52.0)
Hemoglobin: 14.7 g/dL (ref 13.0–17.0)
Immature Granulocytes: 0 %
Lymphocytes Relative: 11 %
Lymphs Abs: 0.3 10*3/uL — ABNORMAL LOW (ref 0.7–4.0)
MCH: 29.9 pg (ref 26.0–34.0)
MCHC: 34.2 g/dL (ref 30.0–36.0)
MCV: 87.6 fL (ref 80.0–100.0)
Monocytes Absolute: 0.5 10*3/uL (ref 0.1–1.0)
Monocytes Relative: 19 %
Neutro Abs: 1.5 10*3/uL — ABNORMAL LOW (ref 1.7–7.7)
Neutrophils Relative %: 65 %
Platelet Count: 178 10*3/uL (ref 150–400)
RBC: 4.91 MIL/uL (ref 4.22–5.81)
RDW: 13.4 % (ref 11.5–15.5)
WBC Count: 2.4 10*3/uL — ABNORMAL LOW (ref 4.0–10.5)
nRBC: 0 % (ref 0.0–0.2)

## 2023-09-05 LAB — RAD ONC ARIA SESSION SUMMARY
Course Elapsed Days: 21
Plan Fractions Treated to Date: 16
Plan Prescribed Dose Per Fraction: 1.8 Gy
Plan Total Fractions Prescribed: 25
Plan Total Prescribed Dose: 45 Gy
Reference Point Dosage Given to Date: 28.8 Gy
Reference Point Session Dosage Given: 1.8 Gy
Session Number: 16

## 2023-09-05 MED ORDER — SODIUM CHLORIDE 0.9 % IV SOLN: Freq: Once | INTRAVENOUS | Status: AC

## 2023-09-05 MED ORDER — SODIUM CHLORIDE 0.9% FLUSH
10.0000 mL | Freq: Once | INTRAVENOUS | Status: AC
  Administered 2023-09-05: 10 mL

## 2023-09-05 MED ORDER — GUAIFENESIN-CODEINE 100-10 MG/5ML PO SOLN
5.0000 mL | Freq: Four times a day (QID) | ORAL | 0 refills | Status: DC | PRN
  Filled 2023-09-05 – 2023-09-11 (×2): qty 473, 24d supply, fill #0

## 2023-09-05 MED ORDER — FAMOTIDINE IN NACL 20-0.9 MG/50ML-% IV SOLN
20.0000 mg | Freq: Once | INTRAVENOUS | Status: AC
  Administered 2023-09-05: 20 mg via INTRAVENOUS
  Filled 2023-09-05: qty 50

## 2023-09-05 MED ORDER — DIPHENHYDRAMINE HCL 50 MG/ML IJ SOLN
25.0000 mg | Freq: Once | INTRAMUSCULAR | Status: AC
  Administered 2023-09-05: 25 mg via INTRAVENOUS
  Filled 2023-09-05: qty 1

## 2023-09-05 MED ORDER — HEPARIN SOD (PORK) LOCK FLUSH 100 UNIT/ML IV SOLN
500.0000 [IU] | Freq: Once | INTRAVENOUS | Status: AC | PRN
  Administered 2023-09-05: 500 [IU]

## 2023-09-05 MED ORDER — PALONOSETRON HCL INJECTION 0.25 MG/5ML
0.2500 mg | Freq: Once | INTRAVENOUS | Status: AC
Start: 2023-09-05 — End: 2023-09-05
  Administered 2023-09-05: 0.25 mg via INTRAVENOUS
  Filled 2023-09-05: qty 5

## 2023-09-05 MED ORDER — SODIUM CHLORIDE 0.9% FLUSH
10.0000 mL | INTRAVENOUS | Status: DC | PRN
  Administered 2023-09-05: 10 mL

## 2023-09-05 MED ORDER — DEXAMETHASONE SODIUM PHOSPHATE 10 MG/ML IJ SOLN
10.0000 mg | Freq: Once | INTRAMUSCULAR | Status: AC
  Administered 2023-09-05: 10 mg via INTRAVENOUS
  Filled 2023-09-05: qty 1

## 2023-09-05 MED ORDER — CARBOPLATIN CHEMO INJECTION 450 MG/45ML
180.0000 mg | Freq: Once | INTRAVENOUS | Status: AC
  Administered 2023-09-05: 180 mg via INTRAVENOUS
  Filled 2023-09-05: qty 18

## 2023-09-05 MED ORDER — SODIUM CHLORIDE 0.9 % IV SOLN
50.0000 mg/m2 | Freq: Once | INTRAVENOUS | Status: AC
  Administered 2023-09-05: 102 mg via INTRAVENOUS
  Filled 2023-09-05: qty 17

## 2023-09-05 NOTE — Progress Notes (Signed)
[facility]   [phone] [phone]   Clinic Follow up Note   Patient Care Team: Storm Frisk, MD as PCP - General (Pulmonary Disease) Donzetta Starch, MD as Consulting Physician (Dermatology) Veryl Speak, FNP as Nurse Practitioner (Infectious Diseases) Forestine Na (Physician Assistant) Bradly Bienenstock, MD as Consulting Physician (Orthopedic Surgery) Malachy Mood, MD as Consulting Physician (Hematology and Oncology)  Date of Service:  09/05/2023  CHIEF COMPLAINT: f/u of esophageal cancer  CURRENT THERAPY:  Concurrent chemoradiation with weekly carboplatin and Taxol  Oncology History   Esophageal cancer [facility]) Distal esophageal carcinoma, 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  We have reviewed his case in the tumor board, and recommend biopsy of the sternal bone lesion.   -He underwent a EUS staging, which showed 2 tumors in the distal esophagus, staged as T1smN0 and T2N0.  -His sternal bone biopsy was negative for malignant cells. -He started concurrent chemotherapy and radiation on 08/15/2023 with weekly carbo and Taxol.     Assessment and Plan    Esophageal Cancer Undergoing concurrent chemotherapy and radiation therapy. Reports decreased chest pain and cough. Experiencing transient joint pain likely secondary to Taxol. -Continue current treatment plan. -Consider Tylenol for transient joint pain as needed.  Dysphagia Difficulty swallowing large pills, but no difficulty with food. -Consider crushing large pills or inquire with pharmacy about alternative formulations.  Gastroesophageal Reflux  Disease Reports recent heartburn, likely secondary to inflammation from chemo-radiation. Currently on daily acid reflux medication. -Continue current acid reflux medication. -Avoid acidic and spicy foods.  Cough Reports improvement with cough medicine. -Continue current cough medicine as needed. -Plan to refill cough medicine later in the week.  General Health Maintenance -Administer influenza vaccine today. -Plan for COVID-19 vaccination 2-3 weeks post-treatment completion.  Follow-up -Return next week for continued treatment and monitoring.    -will proceed chemo today as scheduled at same dose      SUMMARY OF ONCOLOGIC HISTORY: Oncology History  Esophageal cancer (HCC)  07/13/2023 Initial Diagnosis   Esophageal cancer (HCC)   08/15/2023 -  Chemotherapy   Patient is on Treatment Plan : ESOPHAGUS Carboplatin + Paclitaxel Weekly X 6 Weeks with XRT        Discussed the use of AI scribe software for clinical note transcription with the patient, who gave verbal consent to proceed.  History of Present Illness   The patient, a 67 year old male with a known diagnosis of esophageal cancer, presents for a follow-up visit. He reports experiencing chest pain, which is primarily triggered by coughing. The patient notes that the pain has slightly decreased and the frequency of coughing has also reduced. He has been managing the cough with medication, taking it as needed.  In addition to the chest pain, the patient has been experiencing transient pain in various locations, including the wrist and knee. These pains do not last long and tend to move around. The patient also reports a rash or scab on the skin where the treatment cord is inserted, but it is clarified that this is due to the glue used during the procedure.  The patient has been maintaining a good diet and has gained three pounds. He has been  able to stay active and continue with yard work. He has been experiencing some difficulty  swallowing large pills but has no issues with food. He has noticed some heartburn recently, which is not related to a change in diet.  The patient also has a history of high blood pressure and is currently on amlodipine. He previously took losartan, but it was discontinued due to causing swelling in the feet. The patient is due for a flu shot and is considering getting a COVID-19 vaccine after the completion of his current treatment.         All other systems were reviewed with the patient and are negative.  MEDICAL HISTORY:  Past Medical History:  Diagnosis Date   Allergy    seasonal   Aneurysm of infrarenal abdominal aorta (HCC)    4.2 cm 07/12/23   Arthritis    Colon cancer (HCC)    Compression fracture of thoracic vertebra with routine healing 04/30/2020   COPD (chronic obstructive pulmonary disease) (HCC)    Family history of adverse reaction to anesthesia    mother slow to awaken   GERD (gastroesophageal reflux disease)    Hepatitis    hepatitis c dx in remission per pt   Hypertension    Inguinal hernia    Multiple fractures 06/04/2020   Positive colorectal cancer screening using Cologuard test 08/27/2020   Substance abuse (HCC)    Wears glasses    for reading    SURGICAL HISTORY: Past Surgical History:  Procedure Laterality Date   COLONOSCOPY  11/11/2020   ENDOSCOPIC MUCOSAL RESECTION N/A 03/10/2021   Procedure: ENDOSCOPIC MUCOSAL RESECTION;  Surgeon: Lemar Lofty., MD;  Location: [facility];  Service: Gastroenterology;  Laterality: N/A;   ESOPHAGOGASTRODUODENOSCOPY (EGD) WITH PROPOFOL N/A 07/27/2023   Procedure: ESOPHAGOGASTRODUODENOSCOPY (EGD) WITH PROPOFOL;  Surgeon: Meridee Score Netty Starring., MD;  Location: [facility];  Service: Gastroenterology;  Laterality: N/A;   EUS N/A 07/27/2023   Procedure: UPPER ENDOSCOPIC ULTRASOUND (EUS) RADIAL;  Surgeon: Lemar Lofty., MD;  Location: [facility];  Service: Gastroenterology;  Laterality: N/A;    FLEXIBLE SIGMOIDOSCOPY N/A 03/10/2021   Procedure: FLEXIBLE SIGMOIDOSCOPY;  Surgeon: Meridee Score Netty Starring., MD;  Location: [facility];  Service: Gastroenterology;  Laterality: N/A;   HEMOSTASIS CLIP PLACEMENT  03/10/2021   Procedure: HEMOSTASIS CLIP PLACEMENT;  Surgeon: Lemar Lofty., MD;  Location: [facility];  Service: Gastroenterology;;   INGUINAL HERNIA REPAIR Right 01/19/2021   Procedure: LAPAROSCOPIC RIGHT INGUINAL HERNIA REPAIR WITH MESH;  Surgeon: Stechschulte, Hyman Hopes, MD;  Location: [facility];  Service: General;  Laterality: Right;   IR IMAGING GUIDED PORT INSERTION  08/17/2023   obstruction removed as a child   swallowed a peanut   POLYPECTOMY  03/10/2021   Procedure: POLYPECTOMY;  Surgeon: Mansouraty, Netty Starring., MD;  Location: [facility];  Service: Gastroenterology;;   POLYPECTOMY     SUBMUCOSAL LIFTING INJECTION  03/10/2021   Procedure: SUBMUCOSAL LIFTING INJECTION;  Surgeon: Lemar Lofty., MD;  Location: [facility];  Service: Gastroenterology;;    I have reviewed the social history and family history with the patient and they are unchanged from previous note.  ALLERGIES:  has No Known Allergies.  MEDICATIONS:  Current Outpatient Medications  Medication Sig Dispense Refill   albuterol (VENTOLIN HFA) 108 (90 Base) MCG/ACT inhaler Inhale 2 puffs into the lungs every 6 (six) hours as needed for wheezing or shortness of breath. 18 g 2   amLODipine (NORVASC) 10 MG tablet Take 1 tablet (10 mg  total) by mouth daily. 90 tablet 2   fluticasone-salmeterol (ADVAIR HFA) 115-21 MCG/ACT inhaler Inhale 2 puffs into the lungs 2 (two) times daily. 12 g 12   guaiFENesin-codeine 100-10 MG/5ML syrup Take 5 mLs by mouth every 6 (six) hours as needed for cough. 473 mL 0   lidocaine-prilocaine (EMLA) cream Apply 1 Application topically as needed. 30 g 0   Multiple Vitamin (MULTIVITAMIN ADULT PO) Take by mouth.     omeprazole (PRILOSEC) 40 MG capsule  Take 1 capsule (40 mg total) by mouth daily before dinner. 60 capsule 3   ondansetron (ZOFRAN) 8 MG tablet Take 1 tablet (8 mg total) by mouth every 8 (eight) hours as needed for nausea or vomiting. Start on the third day after chemotherapy. 30 tablet 1   prochlorperazine (COMPAZINE) 10 MG tablet Take 1 tablet (10 mg total) by mouth every 6 (six) hours as needed for nausea or vomiting. 30 tablet 1   promethazine-dextromethorphan (PROMETHAZINE-DM) 6.25-15 MG/5ML syrup Take 5 mLs by mouth 4 (four) times daily as needed for cough. 240 mL 1   sildenafil (VIAGRA) 100 MG tablet Take 1 tablet (100 mg total) by mouth daily as needed for erectile dysfunction. 10 tablet 3   sucralfate (CARAFATE) 1 g tablet Take 1 tablet (1 g total) by mouth 4 (four) times daily -  with meals and at bedtime. 60 tablet 1   No current facility-administered medications for this visit.   Facility-Administered Medications Ordered in Other Visits  Medication Dose Route Frequency Provider Last Rate Last Admin   CARBOplatin (PARAPLATIN) 180 mg in sodium chloride 0.9 % 100 mL chemo infusion  180 mg Intravenous Once Malachy Mood, MD       dexamethasone (DECADRON) injection 10 mg  10 mg Intravenous Once Malachy Mood, MD       diphenhydrAMINE (BENADRYL) injection 25 mg  25 mg Intravenous Once Malachy Mood, MD       famotidine (PEPCID) IVPB 20 mg premix  20 mg Intravenous Once Malachy Mood, MD       heparin lock flush 100 unit/mL  500 Units Intracatheter Once PRN Malachy Mood, MD       PACLitaxel (TAXOL) 102 mg in sodium chloride 0.9 % 250 mL chemo infusion (</= 80mg /m2)  50 mg/m2 (Treatment Plan Recorded) Intravenous Once Malachy Mood, MD       palonosetron (ALOXI) injection 0.25 mg  0.25 mg Intravenous Once Malachy Mood, MD       sodium chloride flush (NS) 0.9 % injection 10 mL  10 mL Intracatheter PRN Malachy Mood, MD        PHYSICAL EXAMINATION: ECOG PERFORMANCE STATUS: 1 - Symptomatic but completely ambulatory  Vitals:   09/05/23 0847  BP: 132/71   Pulse: 73  Resp: 18  Temp: 98 F (36.7 C)  SpO2: 100%   Wt Readings from Last 3 Encounters:  09/05/23 175 lb (79.4 kg)  08/28/23 172 lb 4.8 oz (78.2 kg)  08/22/23 170 lb 8 oz (77.3 kg)     GENERAL:alert, no distress and comfortable SKIN: skin color, texture, turgor are normal, no rashes or significant lesions EYES: normal, Conjunctiva are pink and non-injected, sclera clear NECK: supple, thyroid normal size, non-tender, without nodularity LYMPH:  no palpable lymphadenopathy in the cervical, axillary  LUNGS: clear to auscultation and percussion with normal breathing effort HEART: regular rate & rhythm and no murmurs and no lower extremity edema ABDOMEN:abdomen soft, non-tender and normal bowel sounds Musculoskeletal:no cyanosis of digits and no clubbing  NEURO: alert &  oriented x 3 with fluent speech, no focal motor/sensory deficits  LABORATORY DATA:  I have reviewed the data as listed    Latest Ref Rng & Units 09/05/2023    8:26 AM 08/28/2023    8:46 AM 08/21/2023    3:00 PM  CBC  WBC 4.0 - 10.5 K/uL 2.4  2.5  5.9   Hemoglobin 13.0 - 17.0 g/dL 57.8  46.9  62.9   Hematocrit 39.0 - 52.0 % 43.0  45.1  49.5   Platelets 150 - 400 K/uL 178  195  217         Latest Ref Rng & Units 09/05/2023    8:26 AM 08/28/2023    8:46 AM 08/21/2023    3:00 PM  CMP  Glucose 70 - 99 mg/dL 528  413  244   BUN 8 - 23 mg/dL 17  26  28    Creatinine 0.61 - 1.24 mg/dL 0.10  2.72  5.36   Sodium 135 - 145 mmol/L 139  137  136   Potassium 3.5 - 5.1 mmol/L 4.1  4.0  4.8   Chloride 98 - 111 mmol/L 107  103  100   CO2 22 - 32 mmol/L 27  28  30    Calcium 8.9 - 10.3 mg/dL 8.9  9.0  9.4   Total Protein 6.5 - 8.1 g/dL 6.4  6.7  7.0   Total Bilirubin 0.3 - 1.2 mg/dL 0.3  0.4  0.5   Alkaline Phos 38 - 126 U/L 69  72  81   AST 15 - 41 U/L 15  15  14    ALT 0 - 44 U/L 14  13  13        RADIOGRAPHIC STUDIES: I have personally reviewed the radiological images as listed and agreed with the findings in  the report. No results found.    No orders of the defined types were placed in this encounter.  All questions were answered. The patient knows to call the clinic with any problems, questions or concerns. No barriers to learning was detected. The total time spent in the appointment was 25 minutes.     Malachy Mood, MD 09/05/2023

## 2023-09-05 NOTE — Patient Instructions (Signed)
[facility]   Discharge Instructions: Thank you for choosing [facility] to provide your oncology and hematology care.   If you have a lab appointment with the Cancer Center, please go directly to the Cancer Center and check in at the registration area.   Wear comfortable clothing and clothing appropriate for easy access to any Portacath or PICC line.   We strive to give you quality time with your provider. You may need to reschedule your appointment if you arrive late (15 or more minutes).  Arriving late affects you and other patients whose appointments are after yours.  Also, if you miss three or more appointments without notifying the office, you may be dismissed from the clinic at the provider's discretion.      For prescription refill requests, have your pharmacy contact our office and allow 72 hours for refills to be completed.    Today you received the following chemotherapy and/or immunotherapy agents: Paclitaxel (Taxol) and Carboplatin       To help prevent nausea and vomiting after your treatment, we encourage you to take your nausea medication as directed.  BELOW ARE SYMPTOMS THAT SHOULD BE REPORTED IMMEDIATELY: *FEVER GREATER THAN 100.4 F (38 C) OR HIGHER *CHILLS OR SWEATING *NAUSEA AND VOMITING THAT IS NOT CONTROLLED WITH YOUR NAUSEA MEDICATION *UNUSUAL SHORTNESS OF BREATH *UNUSUAL BRUISING OR BLEEDING *URINARY PROBLEMS (pain or burning when urinating, or frequent urination) *BOWEL PROBLEMS (unusual diarrhea, constipation, pain near the anus) TENDERNESS IN MOUTH AND THROAT WITH OR WITHOUT PRESENCE OF ULCERS (sore throat, sores in mouth, or a toothache) UNUSUAL RASH, SWELLING OR PAIN  UNUSUAL VAGINAL DISCHARGE OR ITCHING   Items with * indicate a potential emergency and should be followed up as soon as possible or go to the Emergency Department if any problems should occur.  Please show the CHEMOTHERAPY ALERT CARD or  IMMUNOTHERAPY ALERT CARD at check-in to the Emergency Department and triage nurse.  Should you have questions after your visit or need to cancel or reschedule your appointment, please contact [facility]  Dept: [phone]  and follow the prompts.  Office hours are 8:00 a.m. to 4:30 p.m. Monday - Friday. Please note that voicemails left after 4:00 p.m. may not be returned until the following business day.  We are closed weekends and major holidays. You have access to a nurse at all times for urgent questions. Please call the main number to the clinic Dept: [phone] and follow the prompts.   For any non-urgent questions, you may also contact your provider using MyChart. We now offer e-Visits for anyone 18 and older to request care online for non-urgent symptoms. For details visit [email or web address].   Also download the MyChart app! Go to the app store, search "MyChart", open the app, select [facility], and log in with your MyChart username and password.   

## 2023-09-05 NOTE — Progress Notes (Signed)
Ok to proceed with Carboplatin 180mg  today using Scr 1.23 and ANC 1.5  Richardean Sale, RPH, BCPS, BCOP 09/05/2023 10:07 AM

## 2023-09-06 ENCOUNTER — Other Ambulatory Visit: Payer: Self-pay

## 2023-09-06 ENCOUNTER — Ambulatory Visit
Admission: RE | Admit: 2023-09-06 | Discharge: 2023-09-06 | Disposition: A | Discharge status: Home / Self Care | Payer: 59 | Source: Ambulatory Visit | Attending: Radiation Oncology

## 2023-09-06 ENCOUNTER — Encounter: Payer: Self-pay | Admitting: Genetic Counselor

## 2023-09-06 ENCOUNTER — Ambulatory Visit: Payer: 59

## 2023-09-06 ENCOUNTER — Telehealth: Payer: Self-pay | Admitting: Genetic Counselor

## 2023-09-06 DIAGNOSIS — J449 Chronic obstructive pulmonary disease, unspecified: Secondary | ICD-10-CM | POA: Diagnosis not present

## 2023-09-06 DIAGNOSIS — R55 Syncope and collapse: Secondary | ICD-10-CM | POA: Diagnosis not present

## 2023-09-06 DIAGNOSIS — I10 Essential (primary) hypertension: Secondary | ICD-10-CM | POA: Diagnosis not present

## 2023-09-06 DIAGNOSIS — R490 Dysphonia: Secondary | ICD-10-CM | POA: Diagnosis not present

## 2023-09-06 DIAGNOSIS — R110 Nausea: Secondary | ICD-10-CM | POA: Diagnosis not present

## 2023-09-06 DIAGNOSIS — Z23 Encounter for immunization: Secondary | ICD-10-CM | POA: Diagnosis not present

## 2023-09-06 DIAGNOSIS — R600 Localized edema: Secondary | ICD-10-CM | POA: Diagnosis not present

## 2023-09-06 DIAGNOSIS — K449 Diaphragmatic hernia without obstruction or gangrene: Secondary | ICD-10-CM | POA: Diagnosis not present

## 2023-09-06 DIAGNOSIS — Z1379 Encounter for other screening for genetic and chromosomal anomalies: Secondary | ICD-10-CM | POA: Insufficient documentation

## 2023-09-06 DIAGNOSIS — C155 Malignant neoplasm of lower third of esophagus: Secondary | ICD-10-CM | POA: Diagnosis not present

## 2023-09-06 DIAGNOSIS — Z5111 Encounter for antineoplastic chemotherapy: Secondary | ICD-10-CM | POA: Diagnosis not present

## 2023-09-06 DIAGNOSIS — R079 Chest pain, unspecified: Secondary | ICD-10-CM | POA: Diagnosis not present

## 2023-09-06 DIAGNOSIS — M25471 Effusion, right ankle: Secondary | ICD-10-CM | POA: Diagnosis not present

## 2023-09-06 DIAGNOSIS — R0989 Other specified symptoms and signs involving the circulatory and respiratory systems: Secondary | ICD-10-CM | POA: Diagnosis not present

## 2023-09-06 DIAGNOSIS — Z7951 Long term (current) use of inhaled steroids: Secondary | ICD-10-CM | POA: Diagnosis not present

## 2023-09-06 DIAGNOSIS — Z79899 Other long term (current) drug therapy: Secondary | ICD-10-CM | POA: Diagnosis not present

## 2023-09-06 DIAGNOSIS — Z87891 Personal history of nicotine dependence: Secondary | ICD-10-CM | POA: Diagnosis not present

## 2023-09-06 DIAGNOSIS — M25472 Effusion, left ankle: Secondary | ICD-10-CM | POA: Diagnosis not present

## 2023-09-06 DIAGNOSIS — Z510 Encounter for antineoplastic radiation therapy: Secondary | ICD-10-CM | POA: Diagnosis not present

## 2023-09-06 DIAGNOSIS — K219 Gastro-esophageal reflux disease without esophagitis: Secondary | ICD-10-CM | POA: Diagnosis not present

## 2023-09-06 LAB — RAD ONC ARIA SESSION SUMMARY
Course Elapsed Days: 22
Plan Fractions Treated to Date: 17
Plan Prescribed Dose Per Fraction: 1.8 Gy
Plan Total Fractions Prescribed: 25
Plan Total Prescribed Dose: 45 Gy
Reference Point Dosage Given to Date: 30.6 Gy
Reference Point Session Dosage Given: 1.8 Gy
Session Number: 17

## 2023-09-06 NOTE — Telephone Encounter (Signed)
Called in attempt to disclose negative genetics results.  Mother answered phone and requested I call back later to speak with patient.

## 2023-09-07 ENCOUNTER — Ambulatory Visit
Admission: RE | Admit: 2023-09-07 | Discharge: 2023-09-07 | Disposition: A | Discharge status: Home / Self Care | Payer: 59 | Source: Ambulatory Visit | Attending: Radiation Oncology | Admitting: Radiation Oncology

## 2023-09-07 ENCOUNTER — Ambulatory Visit: Payer: 59

## 2023-09-07 ENCOUNTER — Ambulatory Visit: Payer: Self-pay | Admitting: Genetic Counselor

## 2023-09-07 ENCOUNTER — Other Ambulatory Visit: Payer: Self-pay

## 2023-09-07 DIAGNOSIS — Z23 Encounter for immunization: Secondary | ICD-10-CM | POA: Diagnosis not present

## 2023-09-07 DIAGNOSIS — Z87891 Personal history of nicotine dependence: Secondary | ICD-10-CM | POA: Diagnosis not present

## 2023-09-07 DIAGNOSIS — R079 Chest pain, unspecified: Secondary | ICD-10-CM | POA: Diagnosis not present

## 2023-09-07 DIAGNOSIS — Z7951 Long term (current) use of inhaled steroids: Secondary | ICD-10-CM | POA: Diagnosis not present

## 2023-09-07 DIAGNOSIS — K219 Gastro-esophageal reflux disease without esophagitis: Secondary | ICD-10-CM | POA: Diagnosis not present

## 2023-09-07 DIAGNOSIS — R0989 Other specified symptoms and signs involving the circulatory and respiratory systems: Secondary | ICD-10-CM | POA: Diagnosis not present

## 2023-09-07 DIAGNOSIS — M25471 Effusion, right ankle: Secondary | ICD-10-CM | POA: Diagnosis not present

## 2023-09-07 DIAGNOSIS — Z8042 Family history of malignant neoplasm of prostate: Secondary | ICD-10-CM

## 2023-09-07 DIAGNOSIS — R55 Syncope and collapse: Secondary | ICD-10-CM | POA: Diagnosis not present

## 2023-09-07 DIAGNOSIS — C155 Malignant neoplasm of lower third of esophagus: Secondary | ICD-10-CM

## 2023-09-07 DIAGNOSIS — K449 Diaphragmatic hernia without obstruction or gangrene: Secondary | ICD-10-CM | POA: Diagnosis not present

## 2023-09-07 DIAGNOSIS — Z800 Family history of malignant neoplasm of digestive organs: Secondary | ICD-10-CM

## 2023-09-07 DIAGNOSIS — Z510 Encounter for antineoplastic radiation therapy: Secondary | ICD-10-CM | POA: Diagnosis not present

## 2023-09-07 DIAGNOSIS — R110 Nausea: Secondary | ICD-10-CM | POA: Diagnosis not present

## 2023-09-07 DIAGNOSIS — C189 Malignant neoplasm of colon, unspecified: Secondary | ICD-10-CM

## 2023-09-07 DIAGNOSIS — I10 Essential (primary) hypertension: Secondary | ICD-10-CM | POA: Diagnosis not present

## 2023-09-07 DIAGNOSIS — R600 Localized edema: Secondary | ICD-10-CM | POA: Diagnosis not present

## 2023-09-07 DIAGNOSIS — J449 Chronic obstructive pulmonary disease, unspecified: Secondary | ICD-10-CM | POA: Diagnosis not present

## 2023-09-07 DIAGNOSIS — Z1379 Encounter for other screening for genetic and chromosomal anomalies: Secondary | ICD-10-CM

## 2023-09-07 DIAGNOSIS — Z79899 Other long term (current) drug therapy: Secondary | ICD-10-CM | POA: Diagnosis not present

## 2023-09-07 DIAGNOSIS — M25472 Effusion, left ankle: Secondary | ICD-10-CM | POA: Diagnosis not present

## 2023-09-07 DIAGNOSIS — Z5111 Encounter for antineoplastic chemotherapy: Secondary | ICD-10-CM | POA: Diagnosis not present

## 2023-09-07 DIAGNOSIS — R490 Dysphonia: Secondary | ICD-10-CM | POA: Diagnosis not present

## 2023-09-07 LAB — RAD ONC ARIA SESSION SUMMARY
Course Elapsed Days: 23
Plan Fractions Treated to Date: 18
Plan Prescribed Dose Per Fraction: 1.8 Gy
Plan Total Fractions Prescribed: 25
Plan Total Prescribed Dose: 45 Gy
Reference Point Dosage Given to Date: 32.4 Gy
Reference Point Session Dosage Given: 1.8 Gy
Session Number: 18

## 2023-09-07 NOTE — Telephone Encounter (Signed)
Called mobile number in attempt to disclose genetics results.  No answer and unable to LVM.  MyChart message sent.

## 2023-09-07 NOTE — Progress Notes (Signed)
HPI:   Mr. Stadtler was previously seen in the [facility] due to a personal and family history of cancer and concerns regarding a hereditary predisposition to cancer.    Mr. Urvina recent genetic test results were disclosed to him by MyChart message after being unable to reach him by phone. These results and recommendations are discussed in more detail below.  CANCER HISTORY:  Oncology History  Adenocarcinoma of colon (HCC)  01/19/2021 Initial Diagnosis   Adenocarcinoma of colon (HCC)   08/29/2023 Genetic Testing   Negative [facility] Multi-Cancer +RNA Panel.  Report date is 08/29/2023.   The Multi-Cancer + RNA Panel offered by [facility] includes sequencing and/or deletion/duplication analysis of the following 70 genes:  AIP*, ALK, APC*, ATM*, AXIN2*, BAP1*, BARD1*, BLM*, BMPR1A*, BRCA1*, BRCA2*, BRIP1*, CDC73*, CDH1*, CDK4, CDKN1B*, CDKN2A, CHEK2*, CTNNA1*, DICER1*, EPCAM (del/dup only), EGFR, FH*, FLCN*, GREM1 (promoter dup only), HOXB13, KIT, LZTR1, MAX*, MBD4, MEN1*, MET, MITF, MLH1*, MSH2*, MSH3*, MSH6*, MUTYH*, NF1*, NF2*, NTHL1*, PALB2*, PDGFRA, PMS2*, POLD1*, POLE*, POT1*, PRKAR1A*, PTCH1*, PTEN*, RAD51C*, RAD51D*, RB1*, RET, SDHA* (sequencing only), SDHAF2*, SDHB*, SDHC*, SDHD*, SMAD4*, SMARCA4*, SMARCB1*, SMARCE1*, STK11*, SUFU*, TMEM127*, TP53*, TSC1*, TSC2*, VHL*. RNA analysis is performed for * genes.   Esophageal cancer (HCC)  07/13/2023 Initial Diagnosis   Esophageal cancer (HCC)   08/15/2023 -  Chemotherapy   Patient is on Treatment Plan : ESOPHAGUS Carboplatin + Paclitaxel Weekly X 6 Weeks with XRT     08/29/2023 Genetic Testing   Negative [facility] Multi-Cancer +RNA Panel.  Report date is 08/29/2023.   The Multi-Cancer + RNA Panel offered by [facility] includes sequencing and/or deletion/duplication analysis of the following 70 genes:  AIP*, ALK, APC*, ATM*, AXIN2*, BAP1*, BARD1*, BLM*, BMPR1A*, BRCA1*, BRCA2*, BRIP1*, CDC73*, CDH1*, CDK4, CDKN1B*, CDKN2A,  CHEK2*, CTNNA1*, DICER1*, EPCAM (del/dup only), EGFR, FH*, FLCN*, GREM1 (promoter dup only), HOXB13, KIT, LZTR1, MAX*, MBD4, MEN1*, MET, MITF, MLH1*, MSH2*, MSH3*, MSH6*, MUTYH*, NF1*, NF2*, NTHL1*, PALB2*, PDGFRA, PMS2*, POLD1*, POLE*, POT1*, PRKAR1A*, PTCH1*, PTEN*, RAD51C*, RAD51D*, RB1*, RET, SDHA* (sequencing only), SDHAF2*, SDHB*, SDHC*, SDHD*, SMAD4*, SMARCA4*, SMARCB1*, SMARCE1*, STK11*, SUFU*, TMEM127*, TP53*, TSC1*, TSC2*, VHL*. RNA analysis is performed for * genes.     FAMILY HISTORY:  We obtained a detailed, 4-generation family history.  Significant diagnoses are listed below:      Family History  Problem Relation Age of Onset   Colon polyps Father     Stomach cancer Father 45   Prostate cancer Father          dx late 43s   Lung cancer Sister 22        small cell; smoking hx   Pancreatic cancer Sister 22   Colon polyps Brother          less than 10 lifetime   Colon polyps Brother          less than 10 lifetime   Lung cancer Maternal Aunt          d. 88s; smoking hx   Prostate cancer Maternal Uncle          x4 mat uncles; dx >50; no mets   Kidney cancer Maternal Uncle 41   Melanoma Paternal Uncle          dx >50; systemic therapy   Lung cancer Paternal Uncle          dx >50   Breast cancer Cousin          mat male cousin; dx <50   Uterine cancer  Cousin          mat male cousin dx <50   Esophageal cancer Cousin          mat male cousin dx >50     Mr. Pedregon is unaware of previous family history of genetic testing for hereditary cancer risks.  Other relatives are unavailable for genetic testing at this time.    Mr. Borke reported consanguinity in his parents (4-5 degrees of relation).  There is no reported Ashkenazi Jewish ancestry.   GENETIC TEST RESULTS:  The [facility] Multi-Cancer +RNA Panel found no pathogenic mutations.   The Multi-Cancer + RNA Panel offered by [facility] includes sequencing and/or deletion/duplication analysis of the following 70  genes:  AIP*, ALK, APC*, ATM*, AXIN2*, BAP1*, BARD1*, BLM*, BMPR1A*, BRCA1*, BRCA2*, BRIP1*, CDC73*, CDH1*, CDK4, CDKN1B*, CDKN2A, CHEK2*, CTNNA1*, DICER1*, EPCAM (del/dup only), EGFR, FH*, FLCN*, GREM1 (promoter dup only), HOXB13, KIT, LZTR1, MAX*, MBD4, MEN1*, MET, MITF, MLH1*, MSH2*, MSH3*, MSH6*, MUTYH*, NF1*, NF2*, NTHL1*, PALB2*, PDGFRA, PMS2*, POLD1*, POLE*, POT1*, PRKAR1A*, PTCH1*, PTEN*, RAD51C*, RAD51D*, RB1*, RET, SDHA* (sequencing only), SDHAF2*, SDHB*, SDHC*, SDHD*, SMAD4*, SMARCA4*, SMARCB1*, SMARCE1*, STK11*, SUFU*, TMEM127*, TP53*, TSC1*, TSC2*, VHL*. RNA analysis is performed for * genes. . The test report has been scanned into EPIC and is located under the Molecular Pathology section of the Results Review tab.  A portion of the result report is included below for reference. Genetic testing reported out on August 29, 2023.      Even though a pathogenic variant was not identified, possible explanations for the cancer in the family may include: There may be no hereditary risk for cancer in the family. The cancers in Mr. Givins and/or his family may be sporadic/familial or due to other genetic and environmental factors.  Most cancer is not hereditary. There may be a gene mutation in one of these genes that current testing methods cannot detect but that chance is small. There could be another gene that has not yet been discovered, or that we have not yet tested, that is responsible for the cancer diagnoses in the family.  It is also possible there is a hereditary cause for the cancer in the family that Mr. Nasca did not inherit.  Therefore, it is important to remain in touch with cancer genetics in the future so that we can continue to offer Mr. Woulfe the most up to date genetic testing.    ADDITIONAL GENETIC TESTING:   Mr. Ata genetic testing was fairly extensive.  If there are additional relevant genes identified to increase cancer risk that can be analyzed in the  future, we would be happy to discuss and coordinate this testing at that time.      CANCER SCREENING RECOMMENDATIONS:  Mr. Evoy test result is considered negative (normal).  This means that we have not identified a hereditary cause for his personal history of cancer at this time.    An individual's cancer risk and medical management are not determined by genetic test results alone. Overall cancer risk assessment incorporates additional factors, including personal medical history, family history, and any available genetic information that may result in a personalized plan for cancer prevention and surveillance. Therefore, it is recommended he continue to follow the cancer management and screening guidelines provided by his oncology and primary healthcare provider.   RECOMMENDATIONS FOR FAMILY MEMBERS:   Individuals in this family might be at some increased risk of developing cancer, over the general population risk, due to the family history of cancer.  Individuals in the family should notify their providers of the family history of cancer. We recommend women in this family have a yearly mammogram beginning at age 7, or 19 years younger than the earliest onset of cancer, an annual clinical breast exam, and perform monthly breast self-exams.  Risk models that take into account family history and hormonal history may be helpful in determining appropriate breast cancer screening options for family members. Male relatives should speak with their providers about prostate cancer screening.   First degree relatives of those with colon cancer should receive colonoscopies beginning at age 27, or 10 years prior to the earliest diagnosis of colon cancer in the family, and receive colonoscopies at least every 5 years, or as recommended by their gastroenterologist.   Other members of the family may still carry a pathogenic variant in one of these genes that Mr. Sarte did not inherit. Based on his sister's  history of pancreatic cancer, we recommend his sister's son, his brothers, and his mother, have genetic counseling and testing. Mr. Scala can let us know if we can be of any assistance in coordinating genetic counseling and/or testing for these family members.     FOLLOW-UP:  Cancer genetics is a rapidly advancing field and it is possible that new genetic tests will be appropriate for him and/or his family members in the future. We encourage Mr. Winger to remain in contact with cancer genetics, so we can update his personal and family histories and let him know of advances in cancer genetics that may benefit this family.   Our contact number was provided.  They are welcome to call us at anytime with additional questions or concerns.   Anzleigh Slaven M. Rennie Plowman, MS, [facility] Genetic Counselor [email or web address] (P) [phone]

## 2023-09-08 ENCOUNTER — Ambulatory Visit
Admission: RE | Admit: 2023-09-08 | Discharge: 2023-09-08 | Disposition: A | Discharge status: Home / Self Care | Payer: 59 | Source: Ambulatory Visit | Attending: Radiation Oncology

## 2023-09-08 ENCOUNTER — Other Ambulatory Visit: Payer: Self-pay

## 2023-09-08 ENCOUNTER — Ambulatory Visit: Payer: 59

## 2023-09-08 DIAGNOSIS — Z79899 Other long term (current) drug therapy: Secondary | ICD-10-CM | POA: Diagnosis not present

## 2023-09-08 DIAGNOSIS — Z23 Encounter for immunization: Secondary | ICD-10-CM | POA: Diagnosis not present

## 2023-09-08 DIAGNOSIS — K219 Gastro-esophageal reflux disease without esophagitis: Secondary | ICD-10-CM | POA: Diagnosis not present

## 2023-09-08 DIAGNOSIS — R079 Chest pain, unspecified: Secondary | ICD-10-CM | POA: Diagnosis not present

## 2023-09-08 DIAGNOSIS — J449 Chronic obstructive pulmonary disease, unspecified: Secondary | ICD-10-CM | POA: Diagnosis not present

## 2023-09-08 DIAGNOSIS — C155 Malignant neoplasm of lower third of esophagus: Secondary | ICD-10-CM | POA: Diagnosis not present

## 2023-09-08 DIAGNOSIS — M25472 Effusion, left ankle: Secondary | ICD-10-CM | POA: Diagnosis not present

## 2023-09-08 DIAGNOSIS — R600 Localized edema: Secondary | ICD-10-CM | POA: Diagnosis not present

## 2023-09-08 DIAGNOSIS — K449 Diaphragmatic hernia without obstruction or gangrene: Secondary | ICD-10-CM | POA: Diagnosis not present

## 2023-09-08 DIAGNOSIS — R55 Syncope and collapse: Secondary | ICD-10-CM | POA: Diagnosis not present

## 2023-09-08 DIAGNOSIS — R490 Dysphonia: Secondary | ICD-10-CM | POA: Diagnosis not present

## 2023-09-08 DIAGNOSIS — M25471 Effusion, right ankle: Secondary | ICD-10-CM | POA: Diagnosis not present

## 2023-09-08 DIAGNOSIS — R0989 Other specified symptoms and signs involving the circulatory and respiratory systems: Secondary | ICD-10-CM | POA: Diagnosis not present

## 2023-09-08 DIAGNOSIS — Z510 Encounter for antineoplastic radiation therapy: Secondary | ICD-10-CM | POA: Diagnosis not present

## 2023-09-08 DIAGNOSIS — Z5111 Encounter for antineoplastic chemotherapy: Secondary | ICD-10-CM | POA: Diagnosis not present

## 2023-09-08 DIAGNOSIS — Z7951 Long term (current) use of inhaled steroids: Secondary | ICD-10-CM | POA: Diagnosis not present

## 2023-09-08 DIAGNOSIS — Z87891 Personal history of nicotine dependence: Secondary | ICD-10-CM | POA: Diagnosis not present

## 2023-09-08 DIAGNOSIS — I10 Essential (primary) hypertension: Secondary | ICD-10-CM | POA: Diagnosis not present

## 2023-09-08 DIAGNOSIS — R110 Nausea: Secondary | ICD-10-CM | POA: Diagnosis not present

## 2023-09-08 LAB — RAD ONC ARIA SESSION SUMMARY
Course Elapsed Days: 24
Plan Fractions Treated to Date: 19
Plan Prescribed Dose Per Fraction: 1.8 Gy
Plan Total Fractions Prescribed: 25
Plan Total Prescribed Dose: 45 Gy
Reference Point Dosage Given to Date: 34.2 Gy
Reference Point Session Dosage Given: 1.8 Gy
Session Number: 19

## 2023-09-11 ENCOUNTER — Encounter: Payer: Self-pay | Admitting: Hematology

## 2023-09-11 ENCOUNTER — Ambulatory Visit
Admission: RE | Admit: 2023-09-11 | Discharge: 2023-09-11 | Disposition: A | Discharge status: Home / Self Care | Payer: 59 | Source: Ambulatory Visit | Attending: Radiation Oncology

## 2023-09-11 ENCOUNTER — Other Ambulatory Visit: Payer: Self-pay

## 2023-09-11 ENCOUNTER — Inpatient Hospital Stay: Payer: 59

## 2023-09-11 ENCOUNTER — Ambulatory Visit: Payer: 59

## 2023-09-11 ENCOUNTER — Inpatient Hospital Stay (HOSPITAL_BASED_OUTPATIENT_CLINIC_OR_DEPARTMENT_OTHER): Payer: 59 | Admitting: Hematology

## 2023-09-11 VITALS — BP 144/85 | HR 88 | Temp 97.9°F | Resp 18 | Wt 174.6 lb

## 2023-09-11 DIAGNOSIS — C155 Malignant neoplasm of lower third of esophagus: Secondary | ICD-10-CM

## 2023-09-11 DIAGNOSIS — R55 Syncope and collapse: Secondary | ICD-10-CM | POA: Diagnosis not present

## 2023-09-11 DIAGNOSIS — K449 Diaphragmatic hernia without obstruction or gangrene: Secondary | ICD-10-CM | POA: Diagnosis not present

## 2023-09-11 DIAGNOSIS — Z95828 Presence of other vascular implants and grafts: Secondary | ICD-10-CM

## 2023-09-11 DIAGNOSIS — R600 Localized edema: Secondary | ICD-10-CM | POA: Diagnosis not present

## 2023-09-11 DIAGNOSIS — R079 Chest pain, unspecified: Secondary | ICD-10-CM | POA: Diagnosis not present

## 2023-09-11 DIAGNOSIS — R0989 Other specified symptoms and signs involving the circulatory and respiratory systems: Secondary | ICD-10-CM | POA: Diagnosis not present

## 2023-09-11 DIAGNOSIS — I10 Essential (primary) hypertension: Secondary | ICD-10-CM | POA: Diagnosis not present

## 2023-09-11 DIAGNOSIS — Z23 Encounter for immunization: Secondary | ICD-10-CM | POA: Diagnosis not present

## 2023-09-11 DIAGNOSIS — R110 Nausea: Secondary | ICD-10-CM | POA: Diagnosis not present

## 2023-09-11 DIAGNOSIS — K219 Gastro-esophageal reflux disease without esophagitis: Secondary | ICD-10-CM | POA: Diagnosis not present

## 2023-09-11 DIAGNOSIS — M25472 Effusion, left ankle: Secondary | ICD-10-CM | POA: Diagnosis not present

## 2023-09-11 DIAGNOSIS — Z7951 Long term (current) use of inhaled steroids: Secondary | ICD-10-CM | POA: Diagnosis not present

## 2023-09-11 DIAGNOSIS — J449 Chronic obstructive pulmonary disease, unspecified: Secondary | ICD-10-CM | POA: Diagnosis not present

## 2023-09-11 DIAGNOSIS — R490 Dysphonia: Secondary | ICD-10-CM | POA: Diagnosis not present

## 2023-09-11 DIAGNOSIS — Z87891 Personal history of nicotine dependence: Secondary | ICD-10-CM | POA: Diagnosis not present

## 2023-09-11 DIAGNOSIS — Z79899 Other long term (current) drug therapy: Secondary | ICD-10-CM | POA: Diagnosis not present

## 2023-09-11 DIAGNOSIS — M25471 Effusion, right ankle: Secondary | ICD-10-CM | POA: Diagnosis not present

## 2023-09-11 DIAGNOSIS — Z5111 Encounter for antineoplastic chemotherapy: Secondary | ICD-10-CM | POA: Diagnosis not present

## 2023-09-11 DIAGNOSIS — Z510 Encounter for antineoplastic radiation therapy: Secondary | ICD-10-CM | POA: Diagnosis not present

## 2023-09-11 DIAGNOSIS — C189 Malignant neoplasm of colon, unspecified: Secondary | ICD-10-CM

## 2023-09-11 LAB — CMP (CANCER CENTER ONLY)
ALT: 22 U/L (ref 0–44)
AST: 22 U/L (ref 15–41)
Albumin: 3.9 g/dL (ref 3.5–5.0)
Alkaline Phosphatase: 72 U/L (ref 38–126)
Anion gap: 7 (ref 5–15)
BUN: 21 mg/dL (ref 8–23)
CO2: 27 mmol/L (ref 22–32)
Calcium: 8.9 mg/dL (ref 8.9–10.3)
Chloride: 105 mmol/L (ref 98–111)
Creatinine: 1.22 mg/dL (ref 0.61–1.24)
GFR, Estimated: >60 mL/min (ref >60)
Glucose, Bld: 132 mg/dL — ABNORMAL HIGH (ref 70–99)
Potassium: 4.3 mmol/L (ref 3.5–5.1)
Sodium: 139 mmol/L (ref 135–145)
Total Bilirubin: 0.4 mg/dL (ref 0.3–1.2)
Total Protein: 6.9 g/dL (ref 6.5–8.1)

## 2023-09-11 LAB — RAD ONC ARIA SESSION SUMMARY
Course Elapsed Days: 27
Plan Fractions Treated to Date: 20
Plan Prescribed Dose Per Fraction: 1.8 Gy
Plan Total Fractions Prescribed: 25
Plan Total Prescribed Dose: 45 Gy
Reference Point Dosage Given to Date: 36 Gy
Reference Point Session Dosage Given: 1.8 Gy
Session Number: 20

## 2023-09-11 LAB — CBC WITH DIFFERENTIAL (CANCER CENTER ONLY)
Abs Immature Granulocytes: 0.04 10*3/uL (ref 0.00–0.07)
Basophils Absolute: 0 10*3/uL (ref 0.0–0.1)
Basophils Relative: 1 %
Eosinophils Absolute: 0 10*3/uL (ref 0.0–0.5)
Eosinophils Relative: 2 %
HCT: 43.6 % (ref 39.0–52.0)
Hemoglobin: 14.9 g/dL (ref 13.0–17.0)
Immature Granulocytes: 2 %
Lymphocytes Relative: 10 %
Lymphs Abs: 0.2 10*3/uL — ABNORMAL LOW (ref 0.7–4.0)
MCH: 29.6 pg (ref 26.0–34.0)
MCHC: 34.2 g/dL (ref 30.0–36.0)
MCV: 86.5 fL (ref 80.0–100.0)
Monocytes Absolute: 0.3 10*3/uL (ref 0.1–1.0)
Monocytes Relative: 14 %
Neutro Abs: 1.6 10*3/uL — ABNORMAL LOW (ref 1.7–7.7)
Neutrophils Relative %: 71 %
Platelet Count: 100 10*3/uL — ABNORMAL LOW (ref 150–400)
RBC: 5.04 MIL/uL (ref 4.22–5.81)
RDW: 14.3 % (ref 11.5–15.5)
WBC Count: 2.3 10*3/uL — ABNORMAL LOW (ref 4.0–10.5)
nRBC: 0 % (ref 0.0–0.2)

## 2023-09-11 MED ORDER — DIPHENHYDRAMINE HCL 50 MG/ML IJ SOLN
25.0000 mg | Freq: Once | INTRAMUSCULAR | Status: AC
  Administered 2023-09-11: 25 mg via INTRAVENOUS
  Filled 2023-09-11: qty 1

## 2023-09-11 MED ORDER — SODIUM CHLORIDE 0.9 % IV SOLN
50.0000 mg/m2 | Freq: Once | INTRAVENOUS | Status: AC
  Administered 2023-09-11: 102 mg via INTRAVENOUS
  Filled 2023-09-11: qty 17

## 2023-09-11 MED ORDER — FAMOTIDINE IN NACL 20-0.9 MG/50ML-% IV SOLN
20.0000 mg | Freq: Once | INTRAVENOUS | Status: AC
  Administered 2023-09-11: 20 mg via INTRAVENOUS
  Filled 2023-09-11: qty 50

## 2023-09-11 MED ORDER — SODIUM CHLORIDE 0.9% FLUSH
10.0000 mL | Freq: Once | INTRAVENOUS | Status: AC
Start: 2023-09-11 — End: 2023-09-11
  Administered 2023-09-11: 10 mL

## 2023-09-11 MED ORDER — SODIUM CHLORIDE 0.9 % IV SOLN: Freq: Once | INTRAVENOUS | Status: AC

## 2023-09-11 MED ORDER — CARBOPLATIN CHEMO INJECTION 450 MG/45ML
177.4000 mg | Freq: Once | INTRAVENOUS | Status: AC
  Administered 2023-09-11: 180 mg via INTRAVENOUS
  Filled 2023-09-11: qty 18

## 2023-09-11 MED ORDER — INFLUENZA VAC A&B SURF ANT ADJ 0.5 ML IM SUSY
0.5000 mL | PREFILLED_SYRINGE | Freq: Once | INTRAMUSCULAR | Status: AC
  Administered 2023-09-11: 0.5 mL via INTRAMUSCULAR
  Filled 2023-09-11: qty 0.5

## 2023-09-11 MED ORDER — PALONOSETRON HCL INJECTION 0.25 MG/5ML
0.2500 mg | Freq: Once | INTRAVENOUS | Status: AC
  Administered 2023-09-11: 0.25 mg via INTRAVENOUS
  Filled 2023-09-11: qty 5

## 2023-09-11 MED ORDER — SODIUM CHLORIDE 0.9% FLUSH
10.0000 mL | INTRAVENOUS | Status: DC | PRN
  Administered 2023-09-11: 10 mL

## 2023-09-11 MED ORDER — DEXAMETHASONE SODIUM PHOSPHATE 10 MG/ML IJ SOLN
10.0000 mg | Freq: Once | INTRAMUSCULAR | Status: AC
  Administered 2023-09-11: 10 mg via INTRAVENOUS
  Filled 2023-09-11: qty 1

## 2023-09-11 MED ORDER — HEPARIN SOD (PORK) LOCK FLUSH 100 UNIT/ML IV SOLN
500.0000 [IU] | Freq: Once | INTRAVENOUS | Status: AC | PRN
  Administered 2023-09-11: 500 [IU]

## 2023-09-11 NOTE — Patient Instructions (Signed)
[facility]   Discharge Instructions: Thank you for choosing [facility] to provide your oncology and hematology care.   If you have a lab appointment with the Cancer Center, please go directly to the Cancer Center and check in at the registration area.   Wear comfortable clothing and clothing appropriate for easy access to any Portacath or PICC line.   We strive to give you quality time with your provider. You may need to reschedule your appointment if you arrive late (15 or more minutes).  Arriving late affects you and other patients whose appointments are after yours.  Also, if you miss three or more appointments without notifying the office, you may be dismissed from the clinic at the provider's discretion.      For prescription refill requests, have your pharmacy contact our office and allow 72 hours for refills to be completed.    Today you received the following chemotherapy and/or immunotherapy agents: Paclitaxel (Taxol) and Carboplatin       To help prevent nausea and vomiting after your treatment, we encourage you to take your nausea medication as directed.  BELOW ARE SYMPTOMS THAT SHOULD BE REPORTED IMMEDIATELY: *FEVER GREATER THAN 100.4 F (38 C) OR HIGHER *CHILLS OR SWEATING *NAUSEA AND VOMITING THAT IS NOT CONTROLLED WITH YOUR NAUSEA MEDICATION *UNUSUAL SHORTNESS OF BREATH *UNUSUAL BRUISING OR BLEEDING *URINARY PROBLEMS (pain or burning when urinating, or frequent urination) *BOWEL PROBLEMS (unusual diarrhea, constipation, pain near the anus) TENDERNESS IN MOUTH AND THROAT WITH OR WITHOUT PRESENCE OF ULCERS (sore throat, sores in mouth, or a toothache) UNUSUAL RASH, SWELLING OR PAIN  UNUSUAL VAGINAL DISCHARGE OR ITCHING   Items with * indicate a potential emergency and should be followed up as soon as possible or go to the Emergency Department if any problems should occur.  Please show the CHEMOTHERAPY ALERT CARD or  IMMUNOTHERAPY ALERT CARD at check-in to the Emergency Department and triage nurse.  Should you have questions after your visit or need to cancel or reschedule your appointment, please contact [facility]  Dept: [phone]  and follow the prompts.  Office hours are 8:00 a.m. to 4:30 p.m. Monday - Friday. Please note that voicemails left after 4:00 p.m. may not be returned until the following business day.  We are closed weekends and major holidays. You have access to a nurse at all times for urgent questions. Please call the main number to the clinic Dept: [phone] and follow the prompts.   For any non-urgent questions, you may also contact your provider using MyChart. We now offer e-Visits for anyone 18 and older to request care online for non-urgent symptoms. For details visit [email or web address].   Also download the MyChart app! Go to the app store, search "MyChart", open the app, select [facility], and log in with your MyChart username and password.   

## 2023-09-11 NOTE — Assessment & Plan Note (Signed)
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  We have reviewed his case in the tumor board, and recommend biopsy of the sternal bone lesion.   -He underwent a EUS staging, which showed 2 tumors in the distal esophagus, staged as T1smN0 and T2N0.  -His sternal bone biopsy was negative for malignant cells. -He started concurrent chemotherapy and radiation on 08/15/2023 with weekly carbo and Taxol.

## 2023-09-11 NOTE — Progress Notes (Signed)
[facility]   Telephone:([phone]) [phone] [phone]   Clinic Follow up Note   Patient Care Team: Storm Frisk, MD as PCP - General (Pulmonary Disease) Donzetta Starch, MD as Consulting Physician (Dermatology) Veryl Speak, FNP as Nurse Practitioner (Infectious Diseases) Forestine Na (Physician Assistant) Bradly Bienenstock, MD as Consulting Physician (Orthopedic Surgery) Malachy Mood, MD as Consulting Physician (Hematology and Oncology)  Date of Service:  09/11/2023  CHIEF COMPLAINT: f/u of esophageal cancer  CURRENT THERAPY:  Concurrent chemoradiation with weekly paclitaxel and carboplatin  Oncology History   Esophageal cancer 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  We have reviewed his case in the tumor board, and recommend biopsy of the sternal bone lesion.   -He underwent a EUS staging, which showed 2 tumors in the distal esophagus, staged as T1smN0 and T2N0.  -His sternal bone biopsy was negative for malignant cells. -He started concurrent chemotherapy and radiation on 08/15/2023 with weekly carbo and Taxol.     Assessment and Plan    Esophageal Cancer Undergoing concurrent chemoradiation with tolerable side effects. Difficulty swallowing pills but not food. Stable weight. Mild nausea managed with medication. Cough managed with as-needed cough medicine. Declined surgical intervention. -Continue current chemoradiation regimen. -Consider gummy multivitamins for easier swallowing. -Patient declined surgery.  Plan for additional chemotherapy for three months after completion of  current chemoradiation. -Schedule endoscopy after three months of additional chemotherapy.  Influenza Vaccination Uncertain if received this season. -Administer influenza vaccine today during infusion visit.  General Health Maintenance -Schedule dental visit after completion of current chemoradiation regimen.   Plan -Lab reviewed, adequate for treatment, will proceed chemotherapy today --Return for follow-up and chemotherapy next week.      SUMMARY OF ONCOLOGIC HISTORY: Oncology History  Adenocarcinoma of colon (HCC)  01/19/2021 Initial Diagnosis   Adenocarcinoma of colon (HCC)   08/29/2023 Genetic Testing   Negative [facility] Multi-Cancer +RNA Panel.  Report date is 08/29/2023.   The Multi-Cancer + RNA Panel offered by [facility] includes sequencing and/or deletion/duplication analysis of the following 70 genes:  AIP*, ALK, APC*, ATM*, AXIN2*, BAP1*, BARD1*, BLM*, BMPR1A*, BRCA1*, BRCA2*, BRIP1*, CDC73*, CDH1*, CDK4, CDKN1B*, CDKN2A, CHEK2*, CTNNA1*, DICER1*, EPCAM (del/dup only), EGFR, FH*, FLCN*, GREM1 (promoter dup only), HOXB13, KIT, LZTR1, MAX*, MBD4, MEN1*, MET, MITF, MLH1*, MSH2*, MSH3*, MSH6*, MUTYH*, NF1*, NF2*, NTHL1*, PALB2*, PDGFRA, PMS2*, POLD1*, POLE*, POT1*, PRKAR1A*, PTCH1*, PTEN*, RAD51C*, RAD51D*, RB1*, RET, SDHA* (sequencing only), SDHAF2*, SDHB*, SDHC*, SDHD*, SMAD4*, SMARCA4*, SMARCB1*, SMARCE1*, STK11*, SUFU*, TMEM127*, TP53*, TSC1*, TSC2*, VHL*. RNA analysis is performed for * genes.   Esophageal cancer (HCC)  07/13/2023 Initial Diagnosis   Esophageal cancer (HCC)   08/15/2023 -  Chemotherapy   Patient is on Treatment Plan : ESOPHAGUS Carboplatin + Paclitaxel Weekly X 6 Weeks with XRT     08/29/2023 Genetic Testing   Negative [facility] Multi-Cancer +RNA Panel.  Report date is 08/29/2023.   The Multi-Cancer + RNA Panel offered by [facility] includes sequencing and/or deletion/duplication analysis of the following 70 genes:  AIP*, ALK, APC*, ATM*, AXIN2*, BAP1*, BARD1*,  BLM*, BMPR1A*, BRCA1*, BRCA2*, BRIP1*, CDC73*, CDH1*, CDK4, CDKN1B*, CDKN2A, CHEK2*, CTNNA1*, DICER1*, EPCAM (del/dup only), EGFR, FH*, FLCN*, GREM1 (  promoter dup only), HOXB13, KIT, LZTR1, MAX*, MBD4, MEN1*, MET, MITF, MLH1*, MSH2*, MSH3*, MSH6*, MUTYH*, NF1*, NF2*, NTHL1*, PALB2*, PDGFRA, PMS2*, POLD1*, POLE*, POT1*, PRKAR1A*, PTCH1*, PTEN*, RAD51C*, RAD51D*, RB1*, RET, SDHA* (sequencing only), SDHAF2*, SDHB*, SDHC*, SDHD*, SMAD4*, SMARCA4*, SMARCB1*, SMARCE1*, STK11*, SUFU*, TMEM127*, TP53*, TSC1*, TSC2*, VHL*. RNA analysis is performed for * genes.      Discussed the use of AI scribe software for clinical note transcription with the patient, who gave verbal consent to proceed.  History of Present Illness   The patient, a 67 year old male with a known diagnosis of esophageal cancer, presents for a follow-up visit. He reports difficulty swallowing pills, particularly his multivitamin, but states that he has no problem swallowing food. He also mentions experiencing occasional nausea, for which he has been taking medication. The patient has been managing a cough with medication, which he takes as needed, usually once a day. He also reports a bruise from a previous bone biopsy.         All other systems were reviewed with the patient and are negative.  MEDICAL HISTORY:  Past Medical History:  Diagnosis Date   Allergy    seasonal   Aneurysm of infrarenal abdominal aorta (HCC)    4.2 cm 07/12/23   Arthritis    Colon cancer (HCC)    Compression fracture of thoracic vertebra with routine healing 04/30/2020   COPD (chronic obstructive pulmonary disease) (HCC)    Family history of adverse reaction to anesthesia    mother slow to awaken   GERD (gastroesophageal reflux disease)    Hepatitis    hepatitis c dx in remission per pt   Hypertension    Inguinal hernia    Multiple fractures 06/04/2020   Positive colorectal cancer screening using Cologuard test 08/27/2020   Substance abuse (HCC)     Wears glasses    for reading    SURGICAL HISTORY: Past Surgical History:  Procedure Laterality Date   COLONOSCOPY  11/11/2020   ENDOSCOPIC MUCOSAL RESECTION N/A 03/10/2021   Procedure: ENDOSCOPIC MUCOSAL RESECTION;  Surgeon: Lemar Lofty., MD;  Location: [facility];  Service: Gastroenterology;  Laterality: N/A;   ESOPHAGOGASTRODUODENOSCOPY (EGD) WITH PROPOFOL N/A 07/27/2023   Procedure: ESOPHAGOGASTRODUODENOSCOPY (EGD) WITH PROPOFOL;  Surgeon: Meridee Score Netty Starring., MD;  Location: [facility];  Service: Gastroenterology;  Laterality: N/A;   EUS N/A 07/27/2023   Procedure: UPPER ENDOSCOPIC ULTRASOUND (EUS) RADIAL;  Surgeon: Lemar Lofty., MD;  Location: [facility];  Service: Gastroenterology;  Laterality: N/A;   FLEXIBLE SIGMOIDOSCOPY N/A 03/10/2021   Procedure: FLEXIBLE SIGMOIDOSCOPY;  Surgeon: Meridee Score Netty Starring., MD;  Location: [facility];  Service: Gastroenterology;  Laterality: N/A;   HEMOSTASIS CLIP PLACEMENT  03/10/2021   Procedure: HEMOSTASIS CLIP PLACEMENT;  Surgeon: Lemar Lofty., MD;  Location: [facility];  Service: Gastroenterology;;   INGUINAL HERNIA REPAIR Right 01/19/2021   Procedure: LAPAROSCOPIC RIGHT INGUINAL HERNIA REPAIR WITH MESH;  Surgeon: Stechschulte, Hyman Hopes, MD;  Location: [facility] [place];  Service: General;  Laterality: Right;   IR IMAGING GUIDED PORT INSERTION  08/17/2023   obstruction removed as a child   swallowed a peanut   POLYPECTOMY  03/10/2021   Procedure: POLYPECTOMY;  Surgeon: Lemar Lofty., MD;  Location: [facility];  Service: Gastroenterology;;   POLYPECTOMY     SUBMUCOSAL LIFTING INJECTION  03/10/2021   Procedure: SUBMUCOSAL LIFTING INJECTION;  Surgeon: Lemar Lofty., MD;  Location: [facility];  Service: Gastroenterology;;    I have reviewed the social history and family history  with the patient and they are unchanged from previous note.  ALLERGIES:  has No Known  Allergies.  MEDICATIONS:  Current Outpatient Medications  Medication Sig Dispense Refill   albuterol (VENTOLIN HFA) 108 (90 Base) MCG/ACT inhaler Inhale 2 puffs into the lungs every 6 (six) hours as needed for wheezing or shortness of breath. 18 g 2   amLODipine (NORVASC) 10 MG tablet Take 1 tablet (10 mg total) by mouth daily. 90 tablet 2   fluticasone-salmeterol (ADVAIR HFA) 115-21 MCG/ACT inhaler Inhale 2 puffs into the lungs 2 (two) times daily. 12 g 12   guaiFENesin-codeine 100-10 MG/5ML syrup Take 5 mLs by mouth every 6 (six) hours as needed for cough. 473 mL 0   lidocaine-prilocaine (EMLA) cream Apply 1 Application topically as needed. 30 g 0   Multiple Vitamin (MULTIVITAMIN ADULT PO) Take by mouth.     omeprazole (PRILOSEC) 40 MG capsule Take 1 capsule (40 mg total) by mouth daily before dinner. 60 capsule 3   ondansetron (ZOFRAN) 8 MG tablet Take 1 tablet (8 mg total) by mouth every 8 (eight) hours as needed for nausea or vomiting. Start on the third day after chemotherapy. 30 tablet 1   prochlorperazine (COMPAZINE) 10 MG tablet Take 1 tablet (10 mg total) by mouth every 6 (six) hours as needed for nausea or vomiting. 30 tablet 1   promethazine-dextromethorphan (PROMETHAZINE-DM) 6.25-15 MG/5ML syrup Take 5 mLs by mouth 4 (four) times daily as needed for cough. 240 mL 1   sildenafil (VIAGRA) 100 MG tablet Take 1 tablet (100 mg total) by mouth daily as needed for erectile dysfunction. 10 tablet 3   sucralfate (CARAFATE) 1 g tablet Take 1 tablet (1 g total) by mouth 4 (four) times daily -  with meals and at bedtime. 60 tablet 1   No current facility-administered medications for this visit.   Facility-Administered Medications Ordered in Other Visits  Medication Dose Route Frequency Provider Last Rate Last Admin   CARBOplatin (PARAPLATIN) 180 mg in sodium chloride 0.9 % 100 mL chemo infusion  180 mg Intravenous Once Malachy Mood, MD       heparin lock flush 100 unit/mL  500 Units  Intracatheter Once PRN Malachy Mood, MD       PACLitaxel (TAXOL) 102 mg in sodium chloride 0.9 % 250 mL chemo infusion (</= 80mg /m2)  50 mg/m2 (Treatment Plan Recorded) Intravenous Once Malachy Mood, MD 267 mL/hr at 09/11/23 1543 102 mg at 09/11/23 1543   sodium chloride flush (NS) 0.9 % injection 10 mL  10 mL Intracatheter PRN Malachy Mood, MD        PHYSICAL EXAMINATION: ECOG PERFORMANCE STATUS: 1 - Symptomatic but completely ambulatory  Vitals:   09/11/23 1258  BP: (!) 144/85  Pulse: 88  Resp: 18  Temp: 97.9 F (36.6 C)  SpO2: 99%   Wt Readings from Last 3 Encounters:  09/11/23 174 lb 9.6 oz (79.2 kg)  09/05/23 175 lb (79.4 kg)  08/28/23 172 lb 4.8 oz (78.2 kg)     GENERAL:alert, no distress and comfortable SKIN: skin color, texture, turgor are normal, no rashes or significant lesions EYES: normal, Conjunctiva are pink and non-injected, sclera clear NECK: supple, thyroid normal size, non-tender, without nodularity LYMPH:  no palpable lymphadenopathy in the cervical, axillary  LUNGS: clear to auscultation and percussion with normal breathing effort HEART: regular rate & rhythm and no murmurs and no lower extremity edema ABDOMEN:abdomen soft, non-tender and normal bowel sounds Musculoskeletal:no cyanosis of digits and no clubbing  NEURO:  alert & oriented x 3 with fluent speech, no focal motor/sensory deficits   LABORATORY DATA:  I have reviewed the data as listed    Latest Ref Rng & Units 09/11/2023   12:26 PM 09/05/2023    8:26 AM 08/28/2023    8:46 AM  CBC  WBC 4.0 - 10.5 K/uL 2.3  2.4  2.5   Hemoglobin 13.0 - 17.0 g/dL 13.2  44.0  10.2   Hematocrit 39.0 - 52.0 % 43.6  43.0  45.1   Platelets 150 - 400 K/uL 100  178  195         Latest Ref Rng & Units 09/11/2023   12:26 PM 09/05/2023    8:26 AM 08/28/2023    8:46 AM  CMP  Glucose 70 - 99 mg/dL 725  366  440   BUN 8 - 23 mg/dL 21  17  26    Creatinine 0.61 - 1.24 mg/dL 3.47  4.25  9.56   Sodium 135 - 145 mmol/L 139   139  137   Potassium 3.5 - 5.1 mmol/L 4.3  4.1  4.0   Chloride 98 - 111 mmol/L 105  107  103   CO2 22 - 32 mmol/L 27  27  28    Calcium 8.9 - 10.3 mg/dL 8.9  8.9  9.0   Total Protein 6.5 - 8.1 g/dL 6.9  6.4  6.7   Total Bilirubin 0.3 - 1.2 mg/dL 0.4  0.3  0.4   Alkaline Phos 38 - 126 U/L 72  69  72   AST 15 - 41 U/L 22  15  15    ALT 0 - 44 U/L 22  14  13        RADIOGRAPHIC STUDIES: I have personally reviewed the radiological images as listed and agreed with the findings in the report. No results found.    No orders of the defined types were placed in this encounter.  All questions were answered. The patient knows to call the clinic with any problems, questions or concerns. No barriers to learning was detected. The total time spent in the appointment was 25 mins     Malachy Mood, MD 09/11/2023

## 2023-09-12 ENCOUNTER — Ambulatory Visit
Admission: RE | Admit: 2023-09-12 | Discharge: 2023-09-12 | Disposition: A | Discharge status: Home / Self Care | Payer: 59 | Source: Ambulatory Visit | Attending: Radiation Oncology

## 2023-09-12 ENCOUNTER — Other Ambulatory Visit: Payer: Self-pay

## 2023-09-12 DIAGNOSIS — R0989 Other specified symptoms and signs involving the circulatory and respiratory systems: Secondary | ICD-10-CM | POA: Diagnosis not present

## 2023-09-12 DIAGNOSIS — M25471 Effusion, right ankle: Secondary | ICD-10-CM | POA: Diagnosis not present

## 2023-09-12 DIAGNOSIS — Z510 Encounter for antineoplastic radiation therapy: Secondary | ICD-10-CM | POA: Diagnosis not present

## 2023-09-12 DIAGNOSIS — Z87891 Personal history of nicotine dependence: Secondary | ICD-10-CM | POA: Diagnosis not present

## 2023-09-12 DIAGNOSIS — R490 Dysphonia: Secondary | ICD-10-CM | POA: Diagnosis not present

## 2023-09-12 DIAGNOSIS — Z7951 Long term (current) use of inhaled steroids: Secondary | ICD-10-CM | POA: Diagnosis not present

## 2023-09-12 DIAGNOSIS — K219 Gastro-esophageal reflux disease without esophagitis: Secondary | ICD-10-CM | POA: Diagnosis not present

## 2023-09-12 DIAGNOSIS — Z5111 Encounter for antineoplastic chemotherapy: Secondary | ICD-10-CM | POA: Diagnosis not present

## 2023-09-12 DIAGNOSIS — K449 Diaphragmatic hernia without obstruction or gangrene: Secondary | ICD-10-CM | POA: Diagnosis not present

## 2023-09-12 DIAGNOSIS — J449 Chronic obstructive pulmonary disease, unspecified: Secondary | ICD-10-CM | POA: Diagnosis not present

## 2023-09-12 DIAGNOSIS — R110 Nausea: Secondary | ICD-10-CM | POA: Diagnosis not present

## 2023-09-12 DIAGNOSIS — C155 Malignant neoplasm of lower third of esophagus: Secondary | ICD-10-CM | POA: Diagnosis not present

## 2023-09-12 DIAGNOSIS — R079 Chest pain, unspecified: Secondary | ICD-10-CM | POA: Diagnosis not present

## 2023-09-12 DIAGNOSIS — M25472 Effusion, left ankle: Secondary | ICD-10-CM | POA: Diagnosis not present

## 2023-09-12 DIAGNOSIS — Z23 Encounter for immunization: Secondary | ICD-10-CM | POA: Diagnosis not present

## 2023-09-12 DIAGNOSIS — R600 Localized edema: Secondary | ICD-10-CM | POA: Diagnosis not present

## 2023-09-12 DIAGNOSIS — Z79899 Other long term (current) drug therapy: Secondary | ICD-10-CM | POA: Diagnosis not present

## 2023-09-12 DIAGNOSIS — R55 Syncope and collapse: Secondary | ICD-10-CM | POA: Diagnosis not present

## 2023-09-12 DIAGNOSIS — I10 Essential (primary) hypertension: Secondary | ICD-10-CM | POA: Diagnosis not present

## 2023-09-12 LAB — RAD ONC ARIA SESSION SUMMARY
Course Elapsed Days: 28
Plan Fractions Treated to Date: 21
Plan Prescribed Dose Per Fraction: 1.8 Gy
Plan Total Fractions Prescribed: 25
Plan Total Prescribed Dose: 45 Gy
Reference Point Dosage Given to Date: 37.8 Gy
Reference Point Session Dosage Given: 1.8 Gy
Session Number: 21

## 2023-09-13 ENCOUNTER — Other Ambulatory Visit: Payer: Self-pay

## 2023-09-13 ENCOUNTER — Ambulatory Visit: Payer: 59 | Admitting: Physician Assistant

## 2023-09-13 ENCOUNTER — Ambulatory Visit
Admission: RE | Admit: 2023-09-13 | Discharge: 2023-09-13 | Disposition: A | Discharge status: Home / Self Care | Payer: 59 | Source: Ambulatory Visit | Attending: Radiation Oncology | Admitting: Radiation Oncology

## 2023-09-13 DIAGNOSIS — K219 Gastro-esophageal reflux disease without esophagitis: Secondary | ICD-10-CM | POA: Diagnosis not present

## 2023-09-13 DIAGNOSIS — Z79899 Other long term (current) drug therapy: Secondary | ICD-10-CM | POA: Diagnosis not present

## 2023-09-13 DIAGNOSIS — Z23 Encounter for immunization: Secondary | ICD-10-CM | POA: Diagnosis not present

## 2023-09-13 DIAGNOSIS — Z7951 Long term (current) use of inhaled steroids: Secondary | ICD-10-CM | POA: Diagnosis not present

## 2023-09-13 DIAGNOSIS — R55 Syncope and collapse: Secondary | ICD-10-CM | POA: Diagnosis not present

## 2023-09-13 DIAGNOSIS — M25472 Effusion, left ankle: Secondary | ICD-10-CM | POA: Diagnosis not present

## 2023-09-13 DIAGNOSIS — R0989 Other specified symptoms and signs involving the circulatory and respiratory systems: Secondary | ICD-10-CM | POA: Diagnosis not present

## 2023-09-13 DIAGNOSIS — C155 Malignant neoplasm of lower third of esophagus: Secondary | ICD-10-CM | POA: Diagnosis not present

## 2023-09-13 DIAGNOSIS — R110 Nausea: Secondary | ICD-10-CM | POA: Diagnosis not present

## 2023-09-13 DIAGNOSIS — K449 Diaphragmatic hernia without obstruction or gangrene: Secondary | ICD-10-CM | POA: Diagnosis not present

## 2023-09-13 DIAGNOSIS — Z87891 Personal history of nicotine dependence: Secondary | ICD-10-CM | POA: Diagnosis not present

## 2023-09-13 DIAGNOSIS — M25471 Effusion, right ankle: Secondary | ICD-10-CM | POA: Diagnosis not present

## 2023-09-13 DIAGNOSIS — I10 Essential (primary) hypertension: Secondary | ICD-10-CM | POA: Diagnosis not present

## 2023-09-13 DIAGNOSIS — R490 Dysphonia: Secondary | ICD-10-CM | POA: Diagnosis not present

## 2023-09-13 DIAGNOSIS — R079 Chest pain, unspecified: Secondary | ICD-10-CM | POA: Diagnosis not present

## 2023-09-13 DIAGNOSIS — R600 Localized edema: Secondary | ICD-10-CM | POA: Diagnosis not present

## 2023-09-13 DIAGNOSIS — J449 Chronic obstructive pulmonary disease, unspecified: Secondary | ICD-10-CM | POA: Diagnosis not present

## 2023-09-13 DIAGNOSIS — Z510 Encounter for antineoplastic radiation therapy: Secondary | ICD-10-CM | POA: Diagnosis not present

## 2023-09-13 DIAGNOSIS — Z5111 Encounter for antineoplastic chemotherapy: Secondary | ICD-10-CM | POA: Diagnosis not present

## 2023-09-13 LAB — RAD ONC ARIA SESSION SUMMARY
Course Elapsed Days: 29
Plan Fractions Treated to Date: 22
Plan Prescribed Dose Per Fraction: 1.8 Gy
Plan Total Fractions Prescribed: 25
Plan Total Prescribed Dose: 45 Gy
Reference Point Dosage Given to Date: 39.6 Gy
Reference Point Session Dosage Given: 1.8 Gy
Session Number: 22

## 2023-09-14 ENCOUNTER — Ambulatory Visit
Admission: RE | Admit: 2023-09-14 | Discharge: 2023-09-14 | Disposition: A | Discharge status: Home / Self Care | Payer: 59 | Source: Ambulatory Visit | Attending: Radiation Oncology | Admitting: Radiation Oncology

## 2023-09-14 ENCOUNTER — Other Ambulatory Visit: Payer: Self-pay

## 2023-09-14 ENCOUNTER — Ambulatory Visit: Payer: 59

## 2023-09-14 DIAGNOSIS — R0989 Other specified symptoms and signs involving the circulatory and respiratory systems: Secondary | ICD-10-CM | POA: Diagnosis not present

## 2023-09-14 DIAGNOSIS — K449 Diaphragmatic hernia without obstruction or gangrene: Secondary | ICD-10-CM | POA: Diagnosis not present

## 2023-09-14 DIAGNOSIS — M25472 Effusion, left ankle: Secondary | ICD-10-CM | POA: Diagnosis not present

## 2023-09-14 DIAGNOSIS — Z5111 Encounter for antineoplastic chemotherapy: Secondary | ICD-10-CM | POA: Diagnosis not present

## 2023-09-14 DIAGNOSIS — Z7951 Long term (current) use of inhaled steroids: Secondary | ICD-10-CM | POA: Diagnosis not present

## 2023-09-14 DIAGNOSIS — R110 Nausea: Secondary | ICD-10-CM | POA: Diagnosis not present

## 2023-09-14 DIAGNOSIS — K219 Gastro-esophageal reflux disease without esophagitis: Secondary | ICD-10-CM | POA: Diagnosis not present

## 2023-09-14 DIAGNOSIS — Z510 Encounter for antineoplastic radiation therapy: Secondary | ICD-10-CM | POA: Diagnosis not present

## 2023-09-14 DIAGNOSIS — R55 Syncope and collapse: Secondary | ICD-10-CM | POA: Diagnosis not present

## 2023-09-14 DIAGNOSIS — R490 Dysphonia: Secondary | ICD-10-CM | POA: Diagnosis not present

## 2023-09-14 DIAGNOSIS — Z87891 Personal history of nicotine dependence: Secondary | ICD-10-CM | POA: Diagnosis not present

## 2023-09-14 DIAGNOSIS — M25471 Effusion, right ankle: Secondary | ICD-10-CM | POA: Diagnosis not present

## 2023-09-14 DIAGNOSIS — J449 Chronic obstructive pulmonary disease, unspecified: Secondary | ICD-10-CM | POA: Diagnosis not present

## 2023-09-14 DIAGNOSIS — C155 Malignant neoplasm of lower third of esophagus: Secondary | ICD-10-CM | POA: Diagnosis not present

## 2023-09-14 DIAGNOSIS — I10 Essential (primary) hypertension: Secondary | ICD-10-CM | POA: Diagnosis not present

## 2023-09-14 DIAGNOSIS — Z79899 Other long term (current) drug therapy: Secondary | ICD-10-CM | POA: Diagnosis not present

## 2023-09-14 DIAGNOSIS — R600 Localized edema: Secondary | ICD-10-CM | POA: Diagnosis not present

## 2023-09-14 DIAGNOSIS — Z23 Encounter for immunization: Secondary | ICD-10-CM | POA: Diagnosis not present

## 2023-09-14 DIAGNOSIS — R079 Chest pain, unspecified: Secondary | ICD-10-CM | POA: Diagnosis not present

## 2023-09-14 LAB — RAD ONC ARIA SESSION SUMMARY
Course Elapsed Days: 30
Plan Fractions Treated to Date: 23
Plan Prescribed Dose Per Fraction: 1.8 Gy
Plan Total Fractions Prescribed: 25
Plan Total Prescribed Dose: 45 Gy
Reference Point Dosage Given to Date: 41.4 Gy
Reference Point Session Dosage Given: 1.8 Gy
Session Number: 23

## 2023-09-15 ENCOUNTER — Ambulatory Visit: Payer: 59

## 2023-09-15 ENCOUNTER — Ambulatory Visit
Admission: RE | Admit: 2023-09-15 | Discharge: 2023-09-15 | Disposition: A | Discharge status: Home / Self Care | Payer: 59 | Source: Ambulatory Visit | Attending: Radiation Oncology | Admitting: Radiation Oncology

## 2023-09-15 ENCOUNTER — Other Ambulatory Visit: Payer: Self-pay

## 2023-09-15 ENCOUNTER — Ambulatory Visit
Admission: RE | Admit: 2023-09-15 | Discharge: 2023-09-15 | Disposition: A | Discharge status: Home / Self Care | Payer: 59 | Source: Ambulatory Visit | Attending: Radiation Oncology

## 2023-09-15 DIAGNOSIS — C155 Malignant neoplasm of lower third of esophagus: Secondary | ICD-10-CM | POA: Insufficient documentation

## 2023-09-15 DIAGNOSIS — Z510 Encounter for antineoplastic radiation therapy: Secondary | ICD-10-CM | POA: Diagnosis not present

## 2023-09-15 LAB — RAD ONC ARIA SESSION SUMMARY
Course Elapsed Days: 31
Plan Fractions Treated to Date: 24
Plan Prescribed Dose Per Fraction: 1.8 Gy
Plan Total Fractions Prescribed: 25
Plan Total Prescribed Dose: 45 Gy
Reference Point Dosage Given to Date: 43.2 Gy
Reference Point Session Dosage Given: 1.8 Gy
Session Number: 24

## 2023-09-17 NOTE — Assessment & Plan Note (Signed)
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  We have reviewed his case in the tumor board, and recommend biopsy of the sternal bone lesion.   -He underwent a EUS staging, which showed 2 tumors in the distal esophagus, staged as T1smN0 and T2N0.  -His sternal bone biopsy was negative for malignant cells. -He started concurrent chemotherapy and radiation on 08/15/2023 with weekly carbo and Taxol.

## 2023-09-18 ENCOUNTER — Ambulatory Visit: Payer: 59

## 2023-09-18 ENCOUNTER — Inpatient Hospital Stay: Payer: 59

## 2023-09-18 ENCOUNTER — Other Ambulatory Visit: Payer: Self-pay

## 2023-09-18 ENCOUNTER — Encounter: Payer: Self-pay | Admitting: Hematology

## 2023-09-18 ENCOUNTER — Inpatient Hospital Stay: Payer: 59 | Admitting: Hematology

## 2023-09-18 ENCOUNTER — Inpatient Hospital Stay: Payer: 59 | Attending: Hematology

## 2023-09-18 ENCOUNTER — Ambulatory Visit
Admission: RE | Admit: 2023-09-18 | Discharge: 2023-09-18 | Disposition: A | Discharge status: Home / Self Care | Payer: 59 | Source: Ambulatory Visit | Attending: Radiation Oncology | Admitting: Radiation Oncology

## 2023-09-18 VITALS — BP 145/86 | HR 76 | Temp 97.4°F | Resp 17 | Wt 172.6 lb

## 2023-09-18 DIAGNOSIS — Z510 Encounter for antineoplastic radiation therapy: Secondary | ICD-10-CM | POA: Diagnosis not present

## 2023-09-18 DIAGNOSIS — C155 Malignant neoplasm of lower third of esophagus: Secondary | ICD-10-CM

## 2023-09-18 DIAGNOSIS — Z9221 Personal history of antineoplastic chemotherapy: Secondary | ICD-10-CM | POA: Insufficient documentation

## 2023-09-18 DIAGNOSIS — Z923 Personal history of irradiation: Secondary | ICD-10-CM | POA: Insufficient documentation

## 2023-09-18 DIAGNOSIS — C189 Malignant neoplasm of colon, unspecified: Secondary | ICD-10-CM

## 2023-09-18 DIAGNOSIS — Z85038 Personal history of other malignant neoplasm of large intestine: Secondary | ICD-10-CM | POA: Insufficient documentation

## 2023-09-18 DIAGNOSIS — Z95828 Presence of other vascular implants and grafts: Secondary | ICD-10-CM

## 2023-09-18 DIAGNOSIS — Z5111 Encounter for antineoplastic chemotherapy: Secondary | ICD-10-CM | POA: Insufficient documentation

## 2023-09-18 DIAGNOSIS — R059 Cough, unspecified: Secondary | ICD-10-CM | POA: Insufficient documentation

## 2023-09-18 LAB — CBC WITH DIFFERENTIAL (CANCER CENTER ONLY)
Abs Immature Granulocytes: 0.01 10*3/uL (ref 0.00–0.07)
Basophils Absolute: 0 10*3/uL (ref 0.0–0.1)
Basophils Relative: 2 %
Eosinophils Absolute: 0.1 10*3/uL (ref 0.0–0.5)
Eosinophils Relative: 3 %
HCT: 40.2 % (ref 39.0–52.0)
Hemoglobin: 14.3 g/dL (ref 13.0–17.0)
Immature Granulocytes: 1 %
Lymphocytes Relative: 9 %
Lymphs Abs: 0.2 10*3/uL — ABNORMAL LOW (ref 0.7–4.0)
MCH: 30.4 pg (ref 26.0–34.0)
MCHC: 35.6 g/dL (ref 30.0–36.0)
MCV: 85.4 fL (ref 80.0–100.0)
Monocytes Absolute: 0.3 10*3/uL (ref 0.1–1.0)
Monocytes Relative: 17 %
Neutro Abs: 1.2 10*3/uL — ABNORMAL LOW (ref 1.7–7.7)
Neutrophils Relative %: 68 %
Platelet Count: 117 10*3/uL — ABNORMAL LOW (ref 150–400)
RBC: 4.71 MIL/uL (ref 4.22–5.81)
RDW: 14.4 % (ref 11.5–15.5)
WBC Count: 1.8 10*3/uL — ABNORMAL LOW (ref 4.0–10.5)
nRBC: 0 % (ref 0.0–0.2)

## 2023-09-18 LAB — CMP (CANCER CENTER ONLY)
ALT: 19 U/L (ref 0–44)
AST: 18 U/L (ref 15–41)
Albumin: 3.9 g/dL (ref 3.5–5.0)
Alkaline Phosphatase: 79 U/L (ref 38–126)
Anion gap: 5 (ref 5–15)
BUN: 21 mg/dL (ref 8–23)
CO2: 27 mmol/L (ref 22–32)
Calcium: 9.2 mg/dL (ref 8.9–10.3)
Chloride: 104 mmol/L (ref 98–111)
Creatinine: 1.18 mg/dL (ref 0.61–1.24)
GFR, Estimated: >60 mL/min (ref >60)
Glucose, Bld: 105 mg/dL — ABNORMAL HIGH (ref 70–99)
Potassium: 4.1 mmol/L (ref 3.5–5.1)
Sodium: 136 mmol/L (ref 135–145)
Total Bilirubin: 0.4 mg/dL (ref <1.2)
Total Protein: 6.9 g/dL (ref 6.5–8.1)

## 2023-09-18 LAB — RAD ONC ARIA SESSION SUMMARY
Course Elapsed Days: 34
Plan Fractions Treated to Date: 25
Plan Prescribed Dose Per Fraction: 1.8 Gy
Plan Total Fractions Prescribed: 25
Plan Total Prescribed Dose: 45 Gy
Reference Point Dosage Given to Date: 45 Gy
Reference Point Session Dosage Given: 1.8 Gy
Session Number: 25

## 2023-09-18 MED ORDER — SODIUM CHLORIDE 0.9 % IV SOLN
Freq: Once | INTRAVENOUS | Status: AC
Start: 2023-09-18 — End: 2023-09-18

## 2023-09-18 MED ORDER — SODIUM CHLORIDE 0.9 % IV SOLN
50.0000 mg/m2 | Freq: Once | INTRAVENOUS | Status: AC
  Administered 2023-09-18: 102 mg via INTRAVENOUS
  Filled 2023-09-18: qty 17

## 2023-09-18 MED ORDER — SODIUM CHLORIDE 0.9% FLUSH
10.0000 mL | Freq: Once | INTRAVENOUS | Status: AC
  Administered 2023-09-18: 10 mL

## 2023-09-18 MED ORDER — PALONOSETRON HCL INJECTION 0.25 MG/5ML
0.2500 mg | Freq: Once | INTRAVENOUS | Status: AC
  Administered 2023-09-18: 0.25 mg via INTRAVENOUS
  Filled 2023-09-18: qty 5

## 2023-09-18 MED ORDER — CARBOPLATIN CHEMO INJECTION 450 MG/45ML
181.6000 mg | Freq: Once | INTRAVENOUS | Status: AC
  Administered 2023-09-18: 180 mg via INTRAVENOUS
  Filled 2023-09-18: qty 18

## 2023-09-18 MED ORDER — HEPARIN SOD (PORK) LOCK FLUSH 100 UNIT/ML IV SOLN
500.0000 [IU] | Freq: Once | INTRAVENOUS | Status: AC | PRN
  Administered 2023-09-18: 500 [IU]

## 2023-09-18 MED ORDER — DEXAMETHASONE SODIUM PHOSPHATE 10 MG/ML IJ SOLN
10.0000 mg | Freq: Once | INTRAMUSCULAR | Status: AC
  Administered 2023-09-18: 10 mg via INTRAVENOUS
  Filled 2023-09-18: qty 1

## 2023-09-18 MED ORDER — SODIUM CHLORIDE 0.9% FLUSH
10.0000 mL | INTRAVENOUS | Status: DC | PRN
  Administered 2023-09-18: 10 mL

## 2023-09-18 MED ORDER — FAMOTIDINE IN NACL 20-0.9 MG/50ML-% IV SOLN
20.0000 mg | Freq: Once | INTRAVENOUS | Status: AC
  Administered 2023-09-18: 20 mg via INTRAVENOUS
  Filled 2023-09-18: qty 50

## 2023-09-18 MED ORDER — DIPHENHYDRAMINE HCL 50 MG/ML IJ SOLN
25.0000 mg | Freq: Once | INTRAMUSCULAR | Status: AC
  Administered 2023-09-18: 25 mg via INTRAVENOUS
  Filled 2023-09-18: qty 1

## 2023-09-18 NOTE — Progress Notes (Signed)
[facility]   [phone] [phone]   Clinic Follow up Note   Patient Care Team: Storm Frisk, MD as PCP - General (Pulmonary Disease) Donzetta Starch, MD as Consulting Physician (Dermatology) Veryl Speak, FNP as Nurse Practitioner (Infectious Diseases) Forestine Na (Physician Assistant) Bradly Bienenstock, MD as Consulting Physician (Orthopedic Surgery) Malachy Mood, MD as Consulting Physician (Hematology and Oncology)  Date of Service:  09/18/2023  CHIEF COMPLAINT: f/u of esophageal cancer  CURRENT THERAPY:  Concurrent chemoradiation  Oncology History   Esophageal cancer [facility]) Distal esophageal carcinoma, 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  We have reviewed his case in the tumor board, and recommend biopsy of the sternal bone lesion.   -He underwent a EUS staging, which showed 2 tumors in the distal esophagus, staged as T1smN0 and T2N0.  -His sternal bone biopsy was negative for malignant cells. -He started concurrent chemotherapy and radiation on 08/15/2023 with weekly carbo and Taxol.      Assessment and Plan    Esophageal Cancer Undergoing concurrent chemo-radiation therapy. Tolerating treatment well with no dysphagia. Noted taste changes due to chemotherapy. -Continue current chemo-radiation regimen, she will complete later this week  -Plan for a different type of chemotherapy (FOLFOX) to start after Thanksgiving (first week of December).  Cough Improved with use of cough syrup as needed. -Continue using cough syrup as needed.  plan -Check white blood cell count in 3 weeks to ensure  recovery. -Flush port and perform labs at next visit in 3 weeks. -Schedule follow-up appointment for the week of Thanksgiving (no treatment, just follow-up), to prepare him starting FOLFOX the week after.         SUMMARY OF ONCOLOGIC HISTORY: Oncology History  Adenocarcinoma of colon (HCC)  01/19/2021 Initial Diagnosis   Adenocarcinoma of colon (HCC)   08/29/2023 Genetic Testing   Negative [facility] Multi-Cancer +RNA Panel.  Report date is 08/29/2023.   The Multi-Cancer + RNA Panel offered by [facility] includes sequencing and/or deletion/duplication analysis of the following 70 genes:  AIP*, ALK, APC*, ATM*, AXIN2*, BAP1*, BARD1*, BLM*, BMPR1A*, BRCA1*, BRCA2*, BRIP1*, CDC73*, CDH1*, CDK4, CDKN1B*, CDKN2A, CHEK2*, CTNNA1*, DICER1*, EPCAM (del/dup only), EGFR, FH*, FLCN*, GREM1 (promoter dup only), HOXB13, KIT, LZTR1, MAX*, MBD4, MEN1*, MET, MITF, MLH1*, MSH2*, MSH3*, MSH6*, MUTYH*, NF1*, NF2*, NTHL1*, PALB2*, PDGFRA, PMS2*, POLD1*, POLE*, POT1*, PRKAR1A*, PTCH1*, PTEN*, RAD51C*, RAD51D*, RB1*, RET, SDHA* (sequencing only), SDHAF2*, SDHB*, SDHC*, SDHD*, SMAD4*, SMARCA4*, SMARCB1*, SMARCE1*, STK11*, SUFU*, TMEM127*, TP53*, TSC1*, TSC2*, VHL*. RNA analysis is performed for * genes.   Esophageal cancer (HCC)  07/13/2023 Initial Diagnosis   Esophageal cancer (HCC)   08/15/2023 -  Chemotherapy   Patient is on Treatment Plan : ESOPHAGUS Carboplatin + Paclitaxel Weekly X 6 Weeks with XRT     08/29/2023 Genetic Testing   Negative [facility] Multi-Cancer +RNA Panel.  Report date is 08/29/2023.   The Multi-Cancer + RNA Panel offered by [facility] includes sequencing and/or deletion/duplication analysis of the following 70 genes:  AIP*, ALK, APC*, ATM*, AXIN2*, BAP1*, BARD1*, BLM*, BMPR1A*, BRCA1*, BRCA2*, BRIP1*, CDC73*, CDH1*, CDK4, CDKN1B*, CDKN2A, CHEK2*, CTNNA1*, DICER1*, EPCAM (del/dup only), EGFR, FH*, FLCN*, GREM1 (promoter dup only), HOXB13, KIT, LZTR1, MAX*, MBD4, MEN1*,  MET, MITF, MLH1*, MSH2*, MSH3*,  MSH6*, MUTYH*, NF1*, NF2*, NTHL1*, PALB2*, PDGFRA, PMS2*, POLD1*, POLE*, POT1*, PRKAR1A*, PTCH1*, PTEN*, RAD51C*, RAD51D*, RB1*, RET, SDHA* (sequencing only), SDHAF2*, SDHB*, SDHC*, SDHD*, SMAD4*, SMARCA4*, SMARCB1*, SMARCE1*, STK11*, SUFU*, TMEM127*, TP53*, TSC1*, TSC2*, VHL*. RNA analysis is performed for * genes.      Discussed the use of AI scribe software for clinical note transcription with the patient, who gave verbal consent to proceed.  History of Present Illness   The patient, a 67 year old gentleman with esophageal cancer, is currently undergoing concurrent chemo and radiation therapy. He reports tolerating the treatment well and has not experienced any pain when swallowing food. However, he has noticed a change in the way food tastes due to the chemotherapy, which has made eating a challenge. He has been using cough syrup more frequently to manage a persistent cough, which he reports has improved. Despite the taste changes, he has been making an effort to eat better and maintain his weight.         All other systems were reviewed with the patient and are negative.  MEDICAL HISTORY:  Past Medical History:  Diagnosis Date   Allergy    seasonal   Aneurysm of infrarenal abdominal aorta (HCC)    4.2 cm 07/12/23   Arthritis    Colon cancer (HCC)    Compression fracture of thoracic vertebra with routine healing 04/30/2020   COPD (chronic obstructive pulmonary disease) (HCC)    Family history of adverse reaction to anesthesia    mother slow to awaken   GERD (gastroesophageal reflux disease)    Hepatitis    hepatitis c dx in remission per pt   Hypertension    Inguinal hernia    Multiple fractures 06/04/2020   Positive colorectal cancer screening using Cologuard test 08/27/2020   Substance abuse (HCC)    Wears glasses    for reading    SURGICAL HISTORY: Past Surgical History:  Procedure Laterality Date   COLONOSCOPY  11/11/2020   ENDOSCOPIC MUCOSAL RESECTION N/A  03/10/2021   Procedure: ENDOSCOPIC MUCOSAL RESECTION;  Surgeon: Lemar Lofty., MD;  Location: [facility];  Service: Gastroenterology;  Laterality: N/A;   ESOPHAGOGASTRODUODENOSCOPY (EGD) WITH PROPOFOL N/A 07/27/2023   Procedure: ESOPHAGOGASTRODUODENOSCOPY (EGD) WITH PROPOFOL;  Surgeon: Meridee Score Netty Starring., MD;  Location: [facility];  Service: Gastroenterology;  Laterality: N/A;   EUS N/A 07/27/2023   Procedure: UPPER ENDOSCOPIC ULTRASOUND (EUS) RADIAL;  Surgeon: Lemar Lofty., MD;  Location: [facility];  Service: Gastroenterology;  Laterality: N/A;   FLEXIBLE SIGMOIDOSCOPY N/A 03/10/2021   Procedure: FLEXIBLE SIGMOIDOSCOPY;  Surgeon: Meridee Score Netty Starring., MD;  Location: [facility];  Service: Gastroenterology;  Laterality: N/A;   HEMOSTASIS CLIP PLACEMENT  03/10/2021   Procedure: HEMOSTASIS CLIP PLACEMENT;  Surgeon: Lemar Lofty., MD;  Location: [facility];  Service: Gastroenterology;;   INGUINAL HERNIA REPAIR Right 01/19/2021   Procedure: LAPAROSCOPIC RIGHT INGUINAL HERNIA REPAIR WITH MESH;  Surgeon: Stechschulte, Hyman Hopes, MD;  Location: [facility] [place];  Service: General;  Laterality: Right;   IR IMAGING GUIDED PORT INSERTION  08/17/2023   obstruction removed as a child   swallowed a peanut   POLYPECTOMY  03/10/2021   Procedure: POLYPECTOMY;  Surgeon: Lemar Lofty., MD;  Location: [facility];  Service: Gastroenterology;;   POLYPECTOMY     SUBMUCOSAL LIFTING INJECTION  03/10/2021   Procedure: SUBMUCOSAL LIFTING INJECTION;  Surgeon: Lemar Lofty., MD;  Location: [facility];  Service: Gastroenterology;;    I have reviewed the social  history and family history with the patient and they are unchanged from previous note.  ALLERGIES:  has No Known Allergies.  MEDICATIONS:  Current Outpatient Medications  Medication Sig Dispense Refill   albuterol (VENTOLIN HFA) 108 (90 Base) MCG/ACT inhaler Inhale 2 puffs into the  lungs every 6 (six) hours as needed for wheezing or shortness of breath. 18 g 2   amLODipine (NORVASC) 10 MG tablet Take 1 tablet (10 mg total) by mouth daily. 90 tablet 2   fluticasone-salmeterol (ADVAIR HFA) 115-21 MCG/ACT inhaler Inhale 2 puffs into the lungs 2 (two) times daily. 12 g 12   guaiFENesin-codeine 100-10 MG/5ML syrup Take 5 mLs by mouth every 6 (six) hours as needed for cough. 473 mL 0   lidocaine-prilocaine (EMLA) cream Apply 1 Application topically as needed. 30 g 0   Multiple Vitamin (MULTIVITAMIN ADULT PO) Take by mouth.     omeprazole (PRILOSEC) 40 MG capsule Take 1 capsule (40 mg total) by mouth daily before dinner. 60 capsule 3   ondansetron (ZOFRAN) 8 MG tablet Take 1 tablet (8 mg total) by mouth every 8 (eight) hours as needed for nausea or vomiting. Start on the third day after chemotherapy. 30 tablet 1   prochlorperazine (COMPAZINE) 10 MG tablet Take 1 tablet (10 mg total) by mouth every 6 (six) hours as needed for nausea or vomiting. 30 tablet 1   promethazine-dextromethorphan (PROMETHAZINE-DM) 6.25-15 MG/5ML syrup Take 5 mLs by mouth 4 (four) times daily as needed for cough. 240 mL 1   sildenafil (VIAGRA) 100 MG tablet Take 1 tablet (100 mg total) by mouth daily as needed for erectile dysfunction. 10 tablet 3   sucralfate (CARAFATE) 1 g tablet Take 1 tablet (1 g total) by mouth 4 (four) times daily -  with meals and at bedtime. 60 tablet 1   No current facility-administered medications for this visit.    PHYSICAL EXAMINATION: ECOG PERFORMANCE STATUS: 1 - Symptomatic but completely ambulatory  Vitals:   09/18/23 1105  BP: (!) 145/86  Pulse: 76  Resp: 17  Temp: (!) 97.4 F (36.3 C)  SpO2: 100%   Wt Readings from Last 3 Encounters:  09/18/23 172 lb 9.6 oz (78.3 kg)  09/11/23 174 lb 9.6 oz (79.2 kg)  09/05/23 175 lb (79.4 kg)     GENERAL:alert, no distress and comfortable SKIN: skin color, texture, turgor are normal, no rashes or significant lesions EYES:  normal, Conjunctiva are pink and non-injected, sclera clear NECK: supple, thyroid normal size, non-tender, without nodularity LYMPH:  no palpable lymphadenopathy in the cervical, axillary  LUNGS: clear to auscultation and percussion with normal breathing effort HEART: regular rate & rhythm and no murmurs and no lower extremity edema ABDOMEN:abdomen soft, non-tender and normal bowel sounds Musculoskeletal:no cyanosis of digits and no clubbing  NEURO: alert & oriented x 3 with fluent speech, no focal motor/sensory deficits    LABORATORY DATA:  I have reviewed the data as listed    Latest Ref Rng & Units 09/18/2023   10:05 AM 09/11/2023   12:26 PM 09/05/2023    8:26 AM  CBC  WBC 4.0 - 10.5 K/uL 1.8  2.3  2.4   Hemoglobin 13.0 - 17.0 g/dL 16.1  09.6  04.5   Hematocrit 39.0 - 52.0 % 40.2  43.6  43.0   Platelets 150 - 400 K/uL 117  100  178         Latest Ref Rng & Units 09/18/2023   10:05 AM 09/11/2023   12:26 PM  09/05/2023    8:26 AM  CMP  Glucose 70 - 99 mg/dL 161  096  045   BUN 8 - 23 mg/dL 21  21  17    Creatinine 0.61 - 1.24 mg/dL 4.09  8.11  9.14   Sodium 135 - 145 mmol/L 136  139  139   Potassium 3.5 - 5.1 mmol/L 4.1  4.3  4.1   Chloride 98 - 111 mmol/L 104  105  107   CO2 22 - 32 mmol/L 27  27  27    Calcium 8.9 - 10.3 mg/dL 9.2  8.9  8.9   Total Protein 6.5 - 8.1 g/dL 6.9  6.9  6.4   Total Bilirubin <1.2 mg/dL 0.4  0.4  0.3   Alkaline Phos 38 - 126 U/L 79  72  69   AST 15 - 41 U/L 18  22  15    ALT 0 - 44 U/L 19  22  14        RADIOGRAPHIC STUDIES: I have personally reviewed the radiological images as listed and agreed with the findings in the report. No results found.    No orders of the defined types were placed in this encounter.  All questions were answered. The patient knows to call the clinic with any problems, questions or concerns. No barriers to learning was detected. The total time spent in the appointment was 25 minutes.     Malachy Mood,  MD 09/18/2023

## 2023-09-18 NOTE — Progress Notes (Signed)
Per Dr. Feng, ok to treat with low ANC ?

## 2023-09-18 NOTE — Patient Instructions (Signed)
[facility] - A DEPT OF [facility]  Discharge Instructions: Thank you for choosing [facility] to provide your oncology and hematology care.   If you have a lab appointment with the Cancer Center, please go directly to the Cancer Center and check in at the registration area.   Wear comfortable clothing and clothing appropriate for easy access to any Portacath or PICC line.   We strive to give you quality time with your provider. You may need to reschedule your appointment if you arrive late (15 or more minutes).  Arriving late affects you and other patients whose appointments are after yours.  Also, if you miss three or more appointments without notifying the office, you may be dismissed from the clinic at the provider's discretion.      For prescription refill requests, have your pharmacy contact our office and allow 72 hours for refills to be completed.    Today you received the following chemotherapy and/or immunotherapy agents paclitaxel carboplatin      To help prevent nausea and vomiting after your treatment, we encourage you to take your nausea medication as directed.  BELOW ARE SYMPTOMS THAT SHOULD BE REPORTED IMMEDIATELY: *FEVER GREATER THAN 100.4 F (38 C) OR HIGHER *CHILLS OR SWEATING *NAUSEA AND VOMITING THAT IS NOT CONTROLLED WITH YOUR NAUSEA MEDICATION *UNUSUAL SHORTNESS OF BREATH *UNUSUAL BRUISING OR BLEEDING *URINARY PROBLEMS (pain or burning when urinating, or frequent urination) *BOWEL PROBLEMS (unusual diarrhea, constipation, pain near the anus) TENDERNESS IN MOUTH AND THROAT WITH OR WITHOUT PRESENCE OF ULCERS (sore throat, sores in mouth, or a toothache) UNUSUAL RASH, SWELLING OR PAIN  UNUSUAL VAGINAL DISCHARGE OR ITCHING   Items with * indicate a potential emergency and should be followed up as soon as possible or go to the Emergency Department if any problems should occur.  Please show the CHEMOTHERAPY ALERT CARD or  IMMUNOTHERAPY ALERT CARD at check-in to the Emergency Department and triage nurse.  Should you have questions after your visit or need to cancel or reschedule your appointment, please contact [facility] - A DEPT OF [facility]  Dept: [phone]  and follow the prompts.  Office hours are 8:00 a.m. to 4:30 p.m. Monday - Friday. Please note that voicemails left after 4:00 p.m. may not be returned until the following business day.  We are closed weekends and major holidays. You have access to a nurse at all times for urgent questions. Please call the main number to the clinic Dept: [phone] and follow the prompts.   For any non-urgent questions, you may also contact your provider using MyChart. We now offer e-Visits for anyone 61 and older to request care online for non-urgent symptoms. For details visit [email or web address].   Also download the MyChart app! Go to the app store, search "MyChart", open the app, select , and log in with your MyChart username and password.

## 2023-09-19 ENCOUNTER — Ambulatory Visit: Payer: 59

## 2023-09-19 ENCOUNTER — Other Ambulatory Visit: Payer: Self-pay

## 2023-09-19 ENCOUNTER — Telehealth: Payer: Self-pay | Admitting: Hematology

## 2023-09-19 ENCOUNTER — Ambulatory Visit
Admission: RE | Admit: 2023-09-19 | Discharge: 2023-09-19 | Disposition: A | Discharge status: Home / Self Care | Payer: 59 | Source: Ambulatory Visit | Attending: Radiation Oncology

## 2023-09-19 DIAGNOSIS — C155 Malignant neoplasm of lower third of esophagus: Secondary | ICD-10-CM | POA: Diagnosis not present

## 2023-09-19 DIAGNOSIS — Z510 Encounter for antineoplastic radiation therapy: Secondary | ICD-10-CM | POA: Diagnosis not present

## 2023-09-19 LAB — RAD ONC ARIA SESSION SUMMARY
Course Elapsed Days: 35
Plan Fractions Treated to Date: 1
Plan Prescribed Dose Per Fraction: 1.8 Gy
Plan Total Fractions Prescribed: 3
Plan Total Prescribed Dose: 5.4 Gy
Reference Point Dosage Given to Date: 1.8 Gy
Reference Point Session Dosage Given: 1.8 Gy
Session Number: 26

## 2023-09-20 ENCOUNTER — Other Ambulatory Visit: Payer: Self-pay

## 2023-09-20 ENCOUNTER — Ambulatory Visit: Payer: 59

## 2023-09-20 ENCOUNTER — Ambulatory Visit
Admission: RE | Admit: 2023-09-20 | Discharge: 2023-09-20 | Disposition: A | Discharge status: Home / Self Care | Payer: 59 | Source: Ambulatory Visit | Attending: Radiation Oncology | Admitting: Radiation Oncology

## 2023-09-20 DIAGNOSIS — Z510 Encounter for antineoplastic radiation therapy: Secondary | ICD-10-CM | POA: Diagnosis not present

## 2023-09-20 DIAGNOSIS — C155 Malignant neoplasm of lower third of esophagus: Secondary | ICD-10-CM | POA: Diagnosis not present

## 2023-09-20 LAB — RAD ONC ARIA SESSION SUMMARY
Course Elapsed Days: 36
Plan Fractions Treated to Date: 2
Plan Prescribed Dose Per Fraction: 1.8 Gy
Plan Total Fractions Prescribed: 3
Plan Total Prescribed Dose: 5.4 Gy
Reference Point Dosage Given to Date: 3.6 Gy
Reference Point Session Dosage Given: 1.8 Gy
Session Number: 27

## 2023-09-21 ENCOUNTER — Other Ambulatory Visit: Payer: Self-pay

## 2023-09-21 ENCOUNTER — Ambulatory Visit
Admission: RE | Admit: 2023-09-21 | Discharge: 2023-09-21 | Disposition: A | Discharge status: Home / Self Care | Payer: 59 | Source: Ambulatory Visit | Attending: Radiation Oncology | Admitting: Radiation Oncology

## 2023-09-21 ENCOUNTER — Ambulatory Visit: Payer: 59

## 2023-09-21 DIAGNOSIS — C155 Malignant neoplasm of lower third of esophagus: Secondary | ICD-10-CM | POA: Diagnosis not present

## 2023-09-21 DIAGNOSIS — Z510 Encounter for antineoplastic radiation therapy: Secondary | ICD-10-CM | POA: Diagnosis not present

## 2023-09-21 LAB — RAD ONC ARIA SESSION SUMMARY
Course Elapsed Days: 37
Plan Fractions Treated to Date: 3
Plan Prescribed Dose Per Fraction: 1.8 Gy
Plan Total Fractions Prescribed: 3
Plan Total Prescribed Dose: 5.4 Gy
Reference Point Dosage Given to Date: 5.4 Gy
Reference Point Session Dosage Given: 1.8 Gy
Session Number: 28

## 2023-09-22 ENCOUNTER — Ambulatory Visit: Payer: 59

## 2023-09-22 NOTE — Radiation Completion Notes (Signed)
  Radiation Oncology         [phone] ________________________________  Name: Jordan [name] MRN: [id number]  Date of Service: 09/21/2023  DOB: [birth year]  End of Treatment Note   Diagnosis: At least cT1N0M0, adenocarcinoma of the distal esophagus   Intent: Curative     ==========DELIVERED PLANS==========  First Treatment Date: 2023-08-15 - Last Treatment Date: 2023-09-21   Plan Name: Esoph Site: Esophagus Technique: IMRT Mode: Photon Dose Per Fraction: 1.8 Gy Prescribed Dose (Delivered / Prescribed): 45 Gy / 45 Gy Prescribed Fxs (Delivered / Prescribed): 25 / 25   Plan Name: Esoph_Bst Site: Esophagus Technique: IMRT Mode: Photon Dose Per Fraction: 1.8 Gy Prescribed Dose (Delivered / Prescribed): 5.4 Gy / 5.4 Gy Prescribed Fxs (Delivered / Prescribed): 3 / 3     ==========ON TREATMENT VISIT DATES========== 2023-08-18, 2023-08-25, 2023-09-01, 2023-09-08, 2023-09-15, 2023-09-21   See weekly On Treatment Notes in Epic for details.The patient tolerated radiation. He developed fatigue and esophagitis for which he was treated with carafate.  The patient will receive a call in about one month from the radiation oncology department. He will continue follow up with Dr. Mosetta Putt as well as Dr. Cliffton Asters.      Osker Mason, PAC

## 2023-09-25 ENCOUNTER — Ambulatory Visit: Payer: 59

## 2023-09-26 ENCOUNTER — Ambulatory Visit: Payer: 59

## 2023-09-27 ENCOUNTER — Ambulatory Visit: Payer: 59

## 2023-09-28 ENCOUNTER — Ambulatory Visit: Payer: 59

## 2023-10-02 NOTE — Addendum Note (Signed)
Encounter addended by: Edward Qualia on: 10/02/2023 9:26 AM  Actions taken: Imaging Exam ended

## 2023-10-04 ENCOUNTER — Encounter: Payer: Self-pay | Admitting: Internal Medicine

## 2023-10-06 ENCOUNTER — Ambulatory Visit
Admission: EM | Admit: 2023-10-06 | Discharge: 2023-10-06 | Disposition: A | Discharge status: Home / Self Care | Payer: 59

## 2023-10-06 ENCOUNTER — Emergency Department (HOSPITAL_COMMUNITY): Payer: 59

## 2023-10-06 ENCOUNTER — Other Ambulatory Visit: Payer: Self-pay

## 2023-10-06 ENCOUNTER — Encounter (HOSPITAL_COMMUNITY): Payer: Self-pay

## 2023-10-06 ENCOUNTER — Emergency Department (HOSPITAL_COMMUNITY)
Admission: EM | Admit: 2023-10-06 | Discharge: 2023-10-06 | Disposition: A | Discharge status: Home / Self Care | Payer: 59 | Attending: Emergency Medicine | Admitting: Emergency Medicine

## 2023-10-06 DIAGNOSIS — M19042 Primary osteoarthritis, left hand: Secondary | ICD-10-CM | POA: Diagnosis not present

## 2023-10-06 DIAGNOSIS — Z85038 Personal history of other malignant neoplasm of large intestine: Secondary | ICD-10-CM | POA: Diagnosis not present

## 2023-10-06 DIAGNOSIS — S52202A Unspecified fracture of shaft of left ulna, initial encounter for closed fracture: Secondary | ICD-10-CM | POA: Diagnosis not present

## 2023-10-06 DIAGNOSIS — S52235A Nondisplaced oblique fracture of shaft of left ulna, initial encounter for closed fracture: Secondary | ICD-10-CM | POA: Diagnosis not present

## 2023-10-06 DIAGNOSIS — J449 Chronic obstructive pulmonary disease, unspecified: Secondary | ICD-10-CM | POA: Insufficient documentation

## 2023-10-06 DIAGNOSIS — S61412A Laceration without foreign body of left hand, initial encounter: Secondary | ICD-10-CM | POA: Diagnosis not present

## 2023-10-06 DIAGNOSIS — S60929A Unspecified superficial injury of unspecified hand, initial encounter: Secondary | ICD-10-CM | POA: Diagnosis present

## 2023-10-06 DIAGNOSIS — W11XXXA Fall on and from ladder, initial encounter: Secondary | ICD-10-CM | POA: Insufficient documentation

## 2023-10-06 DIAGNOSIS — M19032 Primary osteoarthritis, left wrist: Secondary | ICD-10-CM | POA: Diagnosis not present

## 2023-10-06 MED ORDER — NAPROXEN 500 MG PO TABS: 500.0000 mg | ORAL_TABLET | Freq: Two times a day (BID) | ORAL | 0 refills | Status: DC

## 2023-10-06 MED ORDER — LIDOCAINE HCL (PF) 1 % IJ SOLN
10.0000 mL | Freq: Once | INTRAMUSCULAR | Status: AC
  Administered 2023-10-06: 10 mL
  Filled 2023-10-06: qty 30

## 2023-10-06 MED ORDER — HYDROCODONE-ACETAMINOPHEN 5-325 MG PO TABS
1.0000 | ORAL_TABLET | Freq: Once | ORAL | Status: AC
  Administered 2023-10-06: 1 via ORAL
  Filled 2023-10-06: qty 1

## 2023-10-06 MED ORDER — OXYCODONE-ACETAMINOPHEN 5-325 MG PO TABS: 1.0000 | ORAL_TABLET | Freq: Four times a day (QID) | ORAL | 0 refills | Status: DC | PRN

## 2023-10-06 NOTE — Discharge Instructions (Addendum)
You were seen in the emergency room today after fall find out fracture and laceration.  Your laceration was repaired with 13 sutures.  Please keep area clean dry and covered, you can apply topical antibiotic ointment.  Please call hand surgery to schedule follow-up for your fracture.  Ice, Tylenol, Naproxen for pain control.  Take Oxycodone for breakthrough pain.   Return to emergency room with any new or worsening symptoms

## 2023-10-06 NOTE — ED Notes (Signed)
Patient is being discharged from the Urgent Care and sent to the Emergency Department via Private Vehicle (with Family) . Per R. Reggie Pile, patient is in need of higher level of care due to Complex Laceration. Patient is aware and verbalizes understanding of plan of care. There were no vitals filed for this visit. Sent immediately to ED.

## 2023-10-06 NOTE — ED Provider Notes (Signed)
Patient here today for evaluation of laceration to left hand that occurred earlier this afternoon when he fell off of a ladder. On exam he has deeper laceration, unable to determine if FB present, tendon involvement but bleeding does seem to have more pulsating quality- given depth of injury, concern for possible vessel injury recommended further evaluation in the ED for repair. Patient is agreeable and will have someone transport to ED via POV- wound wrapped with pressure dressing in office prior to discharge.    Tomi Bamberger, PA-C 10/06/23 1605

## 2023-10-06 NOTE — ED Triage Notes (Signed)
Patient present to UC for Fall (with laceration to left hand). Immediately upon entering room provider examined and advised ED due to extent of Laceration. No head injury. No LOC.

## 2023-10-06 NOTE — ED Triage Notes (Addendum)
C/o laceration to palm of left hand after falling while cleaning gutters.  Chemo pt with last chemo 2.5 weeks ago.  Bleeding controlled. Denies blood thinner usage.  Tetanus UTD

## 2023-10-06 NOTE — ED Provider Notes (Signed)
[place] EMERGENCY DEPARTMENT AT [facility] Provider Note   CSN: [id number] Arrival date & time: 10/06/23  1614     History {Add pertinent medical, surgical, social history, OB history to HPI:1} Chief Complaint  Patient presents with   Laceration    Jordan [name] is a 67 y.o. male with past medical history of substance abuse, COPD, adenocarcinoma of colon, GERD presenting to emergency room after a fall off a ladder.  Patient reports he was climbing up a ladder approximate around 8 feet when he slipped and caught the left side of his palm on a ladder causing laceration.  Patient reports he landed on his left side mainly on left elbow and forearm.  Patient reports since then he has been having left-sided palm and forearm pain.  Reports he was able to ambulate after incident denies head pain, neck pain, chest pain or shortness of breath. Patient was seen at urgent care prior to ER visit. Patient is on chemo, last treatment 2.5 weeks ago.    Laceration      Home Medications Prior to Admission medications   Medication Sig Start Date End Date Taking? Authorizing Provider  albuterol (VENTOLIN HFA) 108 (90 Base) MCG/ACT inhaler Inhale 2 puffs into the lungs every 6 (six) hours as needed for wheezing or shortness of breath. 07/04/23   Storm Frisk, MD  amLODipine (NORVASC) 10 MG tablet Take 1 tablet (10 mg total) by mouth daily. 07/04/23   Storm Frisk, MD  fluticasone-salmeterol (ADVAIR HFA) [phone] MCG/ACT inhaler Inhale 2 puffs into the lungs 2 (two) times daily. 05/16/23   Storm Frisk, MD  guaiFENesin-codeine 100-10 MG/5ML syrup Take 5 mLs by mouth every 6 (six) hours as needed for cough. 09/05/23   Malachy Mood, MD  lidocaine-prilocaine (EMLA) cream Apply 1 Application topically as needed. 08/14/23   Malachy Mood, MD  Multiple Vitamin (MULTIVITAMIN ADULT PO) Take by mouth.    [provider]  omeprazole (PRILOSEC) 40 MG capsule Take 1 capsule (40 mg total)  by mouth daily before dinner. 07/04/23   Storm Frisk, MD  ondansetron (ZOFRAN) 8 MG tablet Take 1 tablet (8 mg total) by mouth every 8 (eight) hours as needed for nausea or vomiting. Start on the third day after chemotherapy. 08/11/23   Malachy Mood, MD  prochlorperazine (COMPAZINE) 10 MG tablet Take 1 tablet (10 mg total) by mouth every 6 (six) hours as needed for nausea or vomiting. 08/11/23   Malachy Mood, MD  promethazine-dextromethorphan (PROMETHAZINE-DM) 6.25-15 MG/5ML syrup Take 5 mLs by mouth 4 (four) times daily as needed for cough. 07/04/23   Storm Frisk, MD  sildenafil (VIAGRA) 100 MG tablet Take 1 tablet (100 mg total) by mouth daily as needed for erectile dysfunction. 10/11/22 10/11/23  Storm Frisk, MD  sucralfate (CARAFATE) 1 g tablet Take 1 tablet (1 g total) by mouth 4 (four) times daily -  with meals and at bedtime. 08/22/23   Malachy Mood, MD      Allergies    Patient has no known allergies.    Review of Systems   Review of Systems  Skin:  Positive for wound.    Physical Exam Updated Vital Signs BP (!) 154/85   Pulse (!) 102   Temp 98 F (36.7 C)   Resp 20   Wt 78 kg   SpO2 93%   BMI 23.98 kg/m  Physical Exam Vitals and nursing note reviewed.  Constitutional:      General:  He is not in acute distress.    Appearance: He is not ill-appearing, toxic-appearing or diaphoretic.  HENT:     Head: Normocephalic and atraumatic.  Eyes:     General: No scleral icterus.    Conjunctiva/sclera: Conjunctivae normal.  Neck:     Comments: No tenderness to palpation over headache, no midline tenderness. Cardiovascular:     Rate and Rhythm: Normal rate and regular rhythm.     Pulses: Normal pulses.     Heart sounds: Normal heart sounds.  Pulmonary:     Effort: Pulmonary effort is normal. No respiratory distress.     Breath sounds: Normal breath sounds.  Abdominal:     General: Abdomen is flat. Bowel sounds are normal.     Palpations: Abdomen is soft.     Tenderness:  There is no abdominal tenderness.  Musculoskeletal:     Right lower leg: No edema.     Left lower leg: No edema.     Comments: Compartments soft with area of swelling over ulnar aspect of forearm, pain to palpation.   Skin:    General: Skin is warm and dry.     Findings: Lesion present.     Comments: Palm laceration, left middle finger into palm, bleeding controlled.  Neurovascularly intact able to flex and extend digits.   Neurological:     General: No focal deficit present.     Mental Status: He is alert and oriented to person, place, and time. Mental status is at baseline.     ED Results / Procedures / Treatments   Labs (all labs ordered are listed, but only abnormal results are displayed) Labs Reviewed - No data to display  EKG None  Radiology DG Hand 2 View Left  Result Date: 10/06/2023 CLINICAL DATA:  Fall.  Laceration to left hand. EXAM: LEFT HAND - 2 VIEW; LEFT FOREARM - 2 VIEW COMPARISON:  None Available. FINDINGS: There is acute, oblique probably incomplete fracture of the middle third shaft of the left ulna. No other acute fracture or dislocation. No aggressive osseous lesion. Mild degenerative changes of imaged joints. No radiopaque foreign bodies. Soft tissues are within normal limits. IMPRESSION: *Acute, oblique, incomplete fracture of the middle third shaft of the left ulna. Electronically Signed   By: Jules Schick M.D.   On: 10/06/2023 18:08   DG Forearm Left  Result Date: 10/06/2023 CLINICAL DATA:  Fall.  Laceration to left hand. EXAM: LEFT HAND - 2 VIEW; LEFT FOREARM - 2 VIEW COMPARISON:  None Available. FINDINGS: There is acute, oblique probably incomplete fracture of the middle third shaft of the left ulna. No other acute fracture or dislocation. No aggressive osseous lesion. Mild degenerative changes of imaged joints. No radiopaque foreign bodies. Soft tissues are within normal limits. IMPRESSION: *Acute, oblique, incomplete fracture of the middle third shaft of  the left ulna. Electronically Signed   By: Jules Schick M.D.   On: 10/06/2023 18:08    Procedures .Marland KitchenLaceration Repair  Date/Time: 10/06/2023 7:58 PM  Performed by: Smitty Knudsen, PA-C Authorized by: Smitty Knudsen, PA-C   Consent:    Consent obtained:  Verbal   Consent given by:  Patient   Risks, benefits, and alternatives were discussed: yes     Risks discussed:  Infection, need for additional repair, nerve damage, retained foreign body, tendon damage, vascular damage, poor wound healing, poor cosmetic result and pain   Alternatives discussed:  No treatment Universal protocol:    Procedure explained and questions answered to  patient or proxy's satisfaction: yes     Relevant documents present and verified: yes     Test results available: yes     Imaging studies available: yes     Required blood products, implants, devices, and special equipment available: yes     Site/side marked: yes     Immediately prior to procedure, a time out was called: yes     Patient identity confirmed:  Verbally with patient Anesthesia:    Anesthesia method:  Topical application Laceration details:    Location:  Hand   Hand location:  L palm   Length (cm):  5 Pre-procedure details:    Preparation:  Patient was prepped and draped in usual sterile fashion Exploration:    Hemostasis achieved with:  Direct pressure   Imaging obtained: x-ray   Treatment:    Area cleansed with:  Saline   Amount of cleaning:  Extensive   Irrigation solution:  Sterile saline   Irrigation method:  Pressure wash   Debridement:  None Skin repair:    Repair method:  Sutures   Suture size:  5-0   Suture material:  Nylon   Suture technique:  Simple interrupted   Number of sutures:  13 Approximation:    Approximation:  Close Repair type:    Repair type:  Simple Post-procedure details:    Dressing:  Antibiotic ointment and non-adherent dressing   Procedure completion:  Tolerated   {Document cardiac monitor,  telemetry assessment procedure when appropriate:1}  Medications Ordered in ED Medications - No data to display  ED Course/ Medical Decision Making/ A&P   {   Click here for ABCD2, HEART and other calculatorsREFRESH Note before signing :1}                              Medical Decision Making Amount and/or Complexity of Data Reviewed Radiology: ordered.  Risk Prescription drug management.   This patient presents to the ED for concern of fall and laceration, this involves an extensive number of treatment options, and is a complaint that carries with it a high risk of complications and morbidity.  The differential diagnosis includes fracture, strain, sprain, pneumothorax   Co morbidities that complicate the patient evaluation  substance abuse, COPD, adenocarcinoma of colon, GERD   Additional history obtained:  None   Lab Tests:  None    Imaging Studies ordered:  I ordered imaging studies including left wrist and forearm I independently visualized and interpreted imaging which showed oblique fracture of ulna I agree with the radiologist interpretation   Cardiac Monitoring: / EKG:  The patient was maintained on a cardiac monitor.    Consultations Obtained:  None   Problem List / ED Course / Critical interventions / Medication management  Patient reporting to emergency room after fall.  Patient is not on blood thinners, did not hit head or lose consciousness.  Patient is not having headache or neck pain.  Patient's physical exam is reassuring however does have area of point tenderness over left forearm.  Obtain x-ray which shows oblique fracture of the ulna.  Patient's compartments soft with no area of open skin over fracture.  Patient does have large laceration to left palm and middle finger.  Laceration was repaired without any obvious vessel or tendon damage.  Patient neurovascularly intact and able to flex and extend digits however does report pain with movement.   Denies any other areas of pain.  Patient overall  well-appearing and pain controlled with PO medications. Patient is requesting discharge and pain medications.  I ordered medication including Norco  for pain  Reevaluation of the patient after these medicines showed that the patient improved I have reviewed the patients home medicines and have made adjustments as needed   Plan  F/u w/ Hand Surgery.  Patient was given return precautions. Patient stable for discharge at this time.  Patient educated on sx/dx and verbalized understanding of plan. Return to ER w/ new or worsening sx.    {Document critical care time when appropriate:1} {Document review of labs and clinical decision tools ie heart score, Chads2Vasc2 etc:1}  {Document your independent review of radiology images, and any outside records:1} {Document your discussion with family members, caretakers, and with consultants:1} {Document social determinants of health affecting pt's care:1} {Document your decision making why or why not admission, treatments were needed:1} Final Clinical Impression(s) / ED Diagnoses Final diagnoses:  Laceration of left hand without foreign body, initial encounter  Closed nondisplaced oblique fracture of shaft of left ulna, initial encounter    Rx / DC Orders ED Discharge Orders     None

## 2023-10-06 NOTE — Progress Notes (Signed)
Orthopedic Tech Progress Note Patient Details:  Jordan [name] [birth year] [id number]  Ortho Devices Type of Ortho Device: Long arm splint, Arm sling Ortho Device/Splint Location: LUE Ortho Device/Splint Interventions: Application   Post Interventions Patient Tolerated: Well  [name] Jordan [name] 10/06/2023, 7:54 PM

## 2023-10-09 ENCOUNTER — Observation Stay (HOSPITAL_COMMUNITY)
Admission: EM | Admit: 2023-10-09 | Discharge: 2023-10-10 | Disposition: A | Discharge status: Home / Self Care | Payer: 59 | Attending: Family Medicine | Admitting: Family Medicine

## 2023-10-09 ENCOUNTER — Emergency Department (HOSPITAL_COMMUNITY): Payer: 59

## 2023-10-09 ENCOUNTER — Encounter (HOSPITAL_COMMUNITY): Payer: Self-pay

## 2023-10-09 ENCOUNTER — Other Ambulatory Visit: Payer: Self-pay

## 2023-10-09 DIAGNOSIS — F1410 Cocaine abuse, uncomplicated: Secondary | ICD-10-CM | POA: Diagnosis not present

## 2023-10-09 DIAGNOSIS — J984 Other disorders of lung: Secondary | ICD-10-CM | POA: Diagnosis not present

## 2023-10-09 DIAGNOSIS — D130 Benign neoplasm of esophagus: Secondary | ICD-10-CM | POA: Diagnosis not present

## 2023-10-09 DIAGNOSIS — R0602 Shortness of breath: Secondary | ICD-10-CM | POA: Diagnosis not present

## 2023-10-09 DIAGNOSIS — R0689 Other abnormalities of breathing: Secondary | ICD-10-CM | POA: Diagnosis not present

## 2023-10-09 DIAGNOSIS — I10 Essential (primary) hypertension: Secondary | ICD-10-CM | POA: Insufficient documentation

## 2023-10-09 DIAGNOSIS — R0902 Hypoxemia: Secondary | ICD-10-CM | POA: Diagnosis not present

## 2023-10-09 DIAGNOSIS — R918 Other nonspecific abnormal finding of lung field: Secondary | ICD-10-CM | POA: Diagnosis not present

## 2023-10-09 DIAGNOSIS — Z1152 Encounter for screening for COVID-19: Secondary | ICD-10-CM | POA: Diagnosis not present

## 2023-10-09 DIAGNOSIS — J9601 Acute respiratory failure with hypoxia: Principal | ICD-10-CM | POA: Insufficient documentation

## 2023-10-09 DIAGNOSIS — Z79899 Other long term (current) drug therapy: Secondary | ICD-10-CM | POA: Diagnosis not present

## 2023-10-09 DIAGNOSIS — Z8501 Personal history of malignant neoplasm of esophagus: Secondary | ICD-10-CM | POA: Insufficient documentation

## 2023-10-09 DIAGNOSIS — M25522 Pain in left elbow: Secondary | ICD-10-CM | POA: Diagnosis not present

## 2023-10-09 DIAGNOSIS — Z87891 Personal history of nicotine dependence: Secondary | ICD-10-CM | POA: Diagnosis not present

## 2023-10-09 DIAGNOSIS — S52202A Unspecified fracture of shaft of left ulna, initial encounter for closed fracture: Secondary | ICD-10-CM | POA: Diagnosis not present

## 2023-10-09 DIAGNOSIS — Z452 Encounter for adjustment and management of vascular access device: Secondary | ICD-10-CM | POA: Diagnosis not present

## 2023-10-09 DIAGNOSIS — J181 Lobar pneumonia, unspecified organism: Secondary | ICD-10-CM | POA: Insufficient documentation

## 2023-10-09 DIAGNOSIS — K2289 Other specified disease of esophagus: Secondary | ICD-10-CM | POA: Diagnosis present

## 2023-10-09 DIAGNOSIS — Z85038 Personal history of other malignant neoplasm of large intestine: Secondary | ICD-10-CM | POA: Insufficient documentation

## 2023-10-09 DIAGNOSIS — E871 Hypo-osmolality and hyponatremia: Secondary | ICD-10-CM | POA: Diagnosis not present

## 2023-10-09 DIAGNOSIS — I700 Atherosclerosis of aorta: Secondary | ICD-10-CM | POA: Insufficient documentation

## 2023-10-09 DIAGNOSIS — J441 Chronic obstructive pulmonary disease with (acute) exacerbation: Secondary | ICD-10-CM | POA: Diagnosis not present

## 2023-10-09 DIAGNOSIS — J9691 Respiratory failure, unspecified with hypoxia: Secondary | ICD-10-CM

## 2023-10-09 LAB — CBC
HCT: 33.9 % — ABNORMAL LOW (ref 39.0–52.0)
Hemoglobin: 11.5 g/dL — ABNORMAL LOW (ref 13.0–17.0)
MCH: 29.6 pg (ref 26.0–34.0)
MCHC: 33.9 g/dL (ref 30.0–36.0)
MCV: 87.4 fL (ref 80.0–100.0)
Platelets: 283 10*3/uL (ref 150–400)
RBC: 3.88 MIL/uL — ABNORMAL LOW (ref 4.22–5.81)
RDW: 17.4 % — ABNORMAL HIGH (ref 11.5–15.5)
WBC: 8.8 10*3/uL (ref 4.0–10.5)
nRBC: 0 % (ref 0.0–0.2)

## 2023-10-09 LAB — BLOOD GAS, ARTERIAL
Acid-Base Excess: 3.9 mmol/L — ABNORMAL HIGH (ref 0.0–2.0)
Bicarbonate: 28.5 mmol/L — ABNORMAL HIGH (ref 20.0–28.0)
Drawn by: 25281
O2 Content: 5 L/min
O2 Saturation: 98.4 %
Patient temperature: 37.1
pCO2 arterial: 42 mm[Hg] (ref 32–48)
pH, Arterial: 7.44 (ref 7.35–7.45)
pO2, Arterial: 114 mm[Hg] — ABNORMAL HIGH (ref 83–108)

## 2023-10-09 LAB — COMPREHENSIVE METABOLIC PANEL
ALT: 51 U/L — ABNORMAL HIGH (ref 0–44)
AST: 41 U/L (ref 15–41)
Albumin: 3 g/dL — ABNORMAL LOW (ref 3.5–5.0)
Alkaline Phosphatase: 113 U/L (ref 38–126)
Anion gap: 9 (ref 5–15)
BUN: 30 mg/dL — ABNORMAL HIGH (ref 8–23)
CO2: 24 mmol/L (ref 22–32)
Calcium: 8.6 mg/dL — ABNORMAL LOW (ref 8.9–10.3)
Chloride: 97 mmol/L — ABNORMAL LOW (ref 98–111)
Creatinine, Ser: 1.32 mg/dL — ABNORMAL HIGH (ref 0.61–1.24)
GFR, Estimated: 59 mL/min — ABNORMAL LOW (ref >60)
Glucose, Bld: 174 mg/dL — ABNORMAL HIGH (ref 70–99)
Potassium: 4.1 mmol/L (ref 3.5–5.1)
Sodium: 130 mmol/L — ABNORMAL LOW (ref 135–145)
Total Bilirubin: 0.7 mg/dL (ref <1.2)
Total Protein: 7.4 g/dL (ref 6.5–8.1)

## 2023-10-09 LAB — RESP PANEL BY RT-PCR (RSV, FLU A&B, COVID)  RVPGX2
Influenza A by PCR: NEGATIVE
Influenza B by PCR: NEGATIVE
Resp Syncytial Virus by PCR: NEGATIVE
SARS Coronavirus 2 by RT PCR: NEGATIVE

## 2023-10-09 LAB — TROPONIN I (HIGH SENSITIVITY)
Troponin I (High Sensitivity): 16 ng/L (ref <18)
Troponin I (High Sensitivity): 17 ng/L (ref <18)

## 2023-10-09 LAB — I-STAT CG4 LACTIC ACID, ED
Lactic Acid, Venous: 1.4 mmol/L (ref 0.5–1.9)
Lactic Acid, Venous: 1.6 mmol/L (ref 0.5–1.9)

## 2023-10-09 LAB — BRAIN NATRIURETIC PEPTIDE: B Natriuretic Peptide: 214 pg/mL — ABNORMAL HIGH (ref 0.0–100.0)

## 2023-10-09 MED ORDER — PANTOPRAZOLE SODIUM 40 MG PO TBEC
40.0000 mg | DELAYED_RELEASE_TABLET | Freq: Every day | ORAL | Status: DC
  Administered 2023-10-10: 40 mg via ORAL
  Filled 2023-10-09: qty 1

## 2023-10-09 MED ORDER — ACETAMINOPHEN 650 MG RE SUPP: 650.0000 mg | Freq: Four times a day (QID) | RECTAL | Status: DC | PRN

## 2023-10-09 MED ORDER — LABETALOL HCL 5 MG/ML IV SOLN
20.0000 mg | INTRAVENOUS | Status: DC | PRN
  Administered 2023-10-09: 20 mg via INTRAVENOUS
  Filled 2023-10-09: qty 4

## 2023-10-09 MED ORDER — ALBUTEROL SULFATE (2.5 MG/3ML) 0.083% IN NEBU
5.0000 mg | INHALATION_SOLUTION | Freq: Once | RESPIRATORY_TRACT | Status: AC
Start: 2023-10-09 — End: 2023-10-09
  Administered 2023-10-09: 5 mg via RESPIRATORY_TRACT
  Filled 2023-10-09: qty 6

## 2023-10-09 MED ORDER — POLYETHYLENE GLYCOL 3350 17 G PO PACK: 17.0000 g | PACK | Freq: Every day | ORAL | Status: DC | PRN

## 2023-10-09 MED ORDER — METHYLPREDNISOLONE SODIUM SUCC 40 MG IJ SOLR
40.0000 mg | Freq: Two times a day (BID) | INTRAMUSCULAR | Status: DC
  Administered 2023-10-09 – 2023-10-10 (×2): 40 mg via INTRAVENOUS
  Filled 2023-10-09 (×2): qty 1

## 2023-10-09 MED ORDER — SODIUM CHLORIDE 0.9 % IV SOLN
500.0000 mg | INTRAVENOUS | Status: DC
  Administered 2023-10-09: 500 mg via INTRAVENOUS
  Filled 2023-10-09: qty 5

## 2023-10-09 MED ORDER — ACETAMINOPHEN 325 MG PO TABS: 650.0000 mg | ORAL_TABLET | Freq: Four times a day (QID) | ORAL | Status: DC | PRN

## 2023-10-09 MED ORDER — SODIUM CHLORIDE 0.9 % IV SOLN
1.0000 g | INTRAVENOUS | Status: DC
  Administered 2023-10-09: 1 g via INTRAVENOUS
  Filled 2023-10-09: qty 10

## 2023-10-09 MED ORDER — MOMETASONE FURO-FORMOTEROL FUM 200-5 MCG/ACT IN AERO
2.0000 | INHALATION_SPRAY | Freq: Two times a day (BID) | RESPIRATORY_TRACT | Status: DC
  Administered 2023-10-10: 2 via RESPIRATORY_TRACT
  Filled 2023-10-09: qty 8.8

## 2023-10-09 MED ORDER — OXYCODONE-ACETAMINOPHEN 5-325 MG PO TABS: 1.0000 | ORAL_TABLET | Freq: Four times a day (QID) | ORAL | Status: DC | PRN

## 2023-10-09 MED ORDER — ENOXAPARIN SODIUM 40 MG/0.4ML IJ SOSY
40.0000 mg | PREFILLED_SYRINGE | INTRAMUSCULAR | Status: DC
  Administered 2023-10-10: 40 mg via SUBCUTANEOUS
  Filled 2023-10-09: qty 0.4

## 2023-10-09 MED ORDER — SODIUM CHLORIDE 0.9% FLUSH
3.0000 mL | Freq: Two times a day (BID) | INTRAVENOUS | Status: DC
  Administered 2023-10-10 (×2): 3 mL via INTRAVENOUS

## 2023-10-09 MED ORDER — IPRATROPIUM-ALBUTEROL 0.5-2.5 (3) MG/3ML IN SOLN
3.0000 mL | RESPIRATORY_TRACT | Status: DC
  Administered 2023-10-09 – 2023-10-10 (×4): 3 mL via RESPIRATORY_TRACT
  Filled 2023-10-09 (×4): qty 3

## 2023-10-09 MED ORDER — HYDRALAZINE HCL 20 MG/ML IJ SOLN: 10.0000 mg | INTRAMUSCULAR | Status: DC | PRN

## 2023-10-09 MED ORDER — LACTATED RINGERS IV BOLUS
500.0000 mL | Freq: Once | INTRAVENOUS | Status: AC
  Administered 2023-10-09: 500 mL via INTRAVENOUS

## 2023-10-09 NOTE — ED Notes (Signed)
Per MD, pt walked down hall way and back w/o oxygen support with [employer] monitoring. Pt's O2 sats between 89%-92% and dyspnea noted. Pt returned to bed and placed back on 5L [place] and O2 sats returned to 95%.

## 2023-10-09 NOTE — ED Notes (Signed)
Pt given [place] sandwich and Apple juice per MD.  Family brought outside food for patient. Pt tolerated food well.

## 2023-10-09 NOTE — ED Notes (Signed)
Pt displays increased work of breathing  (accessory muscle use, tachypnea) and falls asleep shortly after conversation ends. Oriented x4. Continues to require O2 via [place] at 5L to maintain 95%spO2. Pt discouraged from ambulating to restroom; however, he declines to use BSC. He is able to ambulate to the restroom and is returned to [facility] and monitor after returning to bed.

## 2023-10-09 NOTE — Assessment & Plan Note (Signed)
Per dr. Mosetta Putt on 09/18/2023 :Esophageal Cancer Undergoing concurrent chemo-radiation therapy. Tolerating treatment well with no dysphagia. Noted taste changes due to chemotherapy. -Continue current chemo-radiation regimen, she will complete later this week  -Plan for a different type of chemotherapy (FOLFOX) to start after Thanksgiving (first week of December).

## 2023-10-09 NOTE — Assessment & Plan Note (Addendum)
Due to COPD exacerbation.Patient already received Solu-Medrol treatment via EMS.  I will continue DuoNeb therapy for bronchodilator and monitor patient response to above treatment.  T/r/o LLL PNA . cover empirically with ceftriaxone and azithromycin for pneumonia.  Will check a CAT scan of the chest.    Given patient cancer diagnosis as well as recent left forearm fracture, see HPI, consideration of venous thromboembolism.  However exam findings at this time support a localized process in the left lung field with COPD.  Will wait noncontrast CT findings before pursuing alternative diagnosis.

## 2023-10-09 NOTE — ED Provider Notes (Signed)
[place] EMERGENCY DEPARTMENT AT [facility] Provider Note   CSN: [id number] Arrival date & time: 10/09/23  1553     History  Chief Complaint  Patient presents with   Shortness of Breath    Jordan [name] is a 67 y.o. male.   Shortness of Breath    Patient has a history of hypertension abdominal aortic aneurysm, COPD, acid reflux, colon cancer, esophageal cancer.  Patient has been receiving radiation treatments.  Last was on November 7.  Patient states he has been having some trouble with his breathing over the last several weeks.  It was mild and he had not seen anyone for this.  Patient incidentally was seen in the emergency room on November 22 for a laceration.  There is no mention his breathing issues.  Patient states today the symptoms were severe.  He has not had any leg swellings.  He was not aware of any fevers.  He did have some chest discomfort earlier, none now.  He had to call EMS.  Patient was noted to have wheezing by EMS.  His oxygen saturation was 83% on room air.  He was given a breathing treatment as well as Solu-Medrol and magnesium.  Home Medications Prior to Admission medications   Medication Sig Start Date End Date Taking? Authorizing Provider  albuterol (VENTOLIN HFA) 108 (90 Base) MCG/ACT inhaler Inhale 2 puffs into the lungs every 6 (six) hours as needed for wheezing or shortness of breath. 07/04/23   Storm Frisk, MD  amLODipine (NORVASC) 10 MG tablet Take 1 tablet (10 mg total) by mouth daily. 07/04/23   Storm Frisk, MD  fluticasone-salmeterol (ADVAIR HFA) [phone] MCG/ACT inhaler Inhale 2 puffs into the lungs 2 (two) times daily. 05/16/23   Storm Frisk, MD  guaiFENesin-codeine 100-10 MG/5ML syrup Take 5 mLs by mouth every 6 (six) hours as needed for cough. 09/05/23   Malachy Mood, MD  lidocaine-prilocaine (EMLA) cream Apply 1 Application topically as needed. 08/14/23   Malachy Mood, MD  Multiple Vitamin (MULTIVITAMIN ADULT PO) Take by  mouth.    [provider]  naproxen (NAPROSYN) 500 MG tablet Take 1 tablet (500 mg total) by mouth 2 (two) times daily. 10/06/23   Barrett, Horald Chestnut, PA-C  omeprazole (PRILOSEC) 40 MG capsule Take 1 capsule (40 mg total) by mouth daily before dinner. 07/04/23   Storm Frisk, MD  ondansetron (ZOFRAN) 8 MG tablet Take 1 tablet (8 mg total) by mouth every 8 (eight) hours as needed for nausea or vomiting. Start on the third day after chemotherapy. 08/11/23   Malachy Mood, MD  oxyCODONE-acetaminophen (PERCOCET/ROXICET) 5-325 MG tablet Take 1 tablet by mouth every 6 (six) hours as needed for severe pain (pain score 7-10). 10/06/23   Barrett, Horald Chestnut, PA-C  prochlorperazine (COMPAZINE) 10 MG tablet Take 1 tablet (10 mg total) by mouth every 6 (six) hours as needed for nausea or vomiting. 08/11/23   Malachy Mood, MD  promethazine-dextromethorphan (PROMETHAZINE-DM) 6.25-15 MG/5ML syrup Take 5 mLs by mouth 4 (four) times daily as needed for cough. 07/04/23   Storm Frisk, MD  sildenafil (VIAGRA) 100 MG tablet Take 1 tablet (100 mg total) by mouth daily as needed for erectile dysfunction. 10/11/22 10/11/23  Storm Frisk, MD  sucralfate (CARAFATE) 1 g tablet Take 1 tablet (1 g total) by mouth 4 (four) times daily -  with meals and at bedtime. 08/22/23   Malachy Mood, MD      Allergies  Patient has no known allergies.    Review of Systems   Review of Systems  Respiratory:  Positive for shortness of breath.     Physical Exam Updated Vital Signs BP (!) 176/101   Pulse 97   Temp 98.1 F (36.7 C) (Oral)   Resp (!) 24   Ht 1.803 m (5\' 11" )   Wt 78 kg   SpO2 96%   BMI 23.98 kg/m  Physical Exam Vitals and nursing note reviewed.  Constitutional:      Appearance: He is well-developed. He is ill-appearing.  HENT:     Head: Normocephalic and atraumatic.     Right Ear: External ear normal.     Left Ear: External ear normal.  Eyes:     General: No scleral icterus.       Right eye: No  discharge.        Left eye: No discharge.     Conjunctiva/sclera: Conjunctivae normal.  Neck:     Trachea: No tracheal deviation.  Cardiovascular:     Rate and Rhythm: Normal rate and regular rhythm.  Pulmonary:     Effort: Tachypnea and respiratory distress present.     Breath sounds: No stridor. Decreased breath sounds and wheezing present. No rales.  Abdominal:     General: Bowel sounds are normal. There is no distension.     Palpations: Abdomen is soft.     Tenderness: There is no abdominal tenderness. There is no guarding or rebound.  Musculoskeletal:        General: No tenderness or deformity.     Cervical back: Neck supple.     Right lower leg: No edema.     Left lower leg: No edema.  Skin:    General: Skin is warm and dry.     Findings: No rash.  Neurological:     General: No focal deficit present.     Mental Status: He is alert.     Cranial Nerves: No cranial nerve deficit, dysarthria or facial asymmetry.     Sensory: No sensory deficit.     Motor: No abnormal muscle tone or seizure activity.     Coordination: Coordination normal.  Psychiatric:        Mood and Affect: Mood normal.     ED Results / Procedures / Treatments   Labs (all labs ordered are listed, but only abnormal results are displayed) Labs Reviewed  COMPREHENSIVE METABOLIC PANEL - Abnormal; Notable for the following components:      Result Value   Sodium 130 (*)    Chloride 97 (*)    Glucose, Bld 174 (*)    BUN 30 (*)    Creatinine, Ser 1.32 (*)    Calcium 8.6 (*)    Albumin 3.0 (*)    ALT 51 (*)    GFR, Estimated 59 (*)    All other components within normal limits  CBC - Abnormal; Notable for the following components:   RBC 3.88 (*)    Hemoglobin 11.5 (*)    HCT 33.9 (*)    RDW 17.4 (*)    All other components within normal limits  BRAIN NATRIURETIC PEPTIDE - Abnormal; Notable for the following components:   B Natriuretic Peptide 214.0 (*)    All other components within normal limits   RESP PANEL BY RT-PCR (RSV, FLU A&B, COVID)  RVPGX2  CULTURE, BLOOD (ROUTINE X 2)  CULTURE, BLOOD (ROUTINE X 2)  I-STAT CG4 LACTIC ACID, ED  I-STAT CG4 LACTIC ACID, ED  TROPONIN I (HIGH SENSITIVITY)  TROPONIN I (HIGH SENSITIVITY)    EKG EKG Interpretation Date/Time:  Monday October 09 2023 16:12:00 EST Ventricular Rate:  105 PR Interval:  152 QRS Duration:  90 QT Interval:  336 QTC Calculation: 444 R Axis:   46  Text Interpretation: Sinus tachycardia Ventricular premature complex Since last tracing rate faster Confirmed by Linwood Dibbles [phone]) on 10/09/2023 4:18:36 PM  Radiology DG Chest Port 1 View  Result Date: 10/09/2023 CLINICAL DATA:  Shortness of breath EXAM: PORTABLE CHEST 1 VIEW COMPARISON:  06/04/2020 FINDINGS: Right central line tip at the cavoatrial junction. No pneumothorax. Heart and mediastinal contours within normal limits. No confluent opacities or effusions. No acute bony abnormality. IMPRESSION: No acute cardiopulmonary disease. Electronically Signed   By: Charlett Nose M.D.   On: 10/09/2023 16:59    Procedures Procedures    Medications Ordered in ED Medications  albuterol (PROVENTIL) (2.5 MG/3ML) 0.083% nebulizer solution 5 mg (has no administration in time range)  lactated ringers bolus 500 mL (0 mLs Intravenous Stopped 10/09/23 2002)    ED Course/ Medical Decision Making/ A&P Clinical Course as of 10/09/23 2121  Mon Oct 09, 2023  1756 CBC(!) CBC shows hemoglobin decreased compared to previous [JK]  1951 Metabolic panel without significant abnormalities.  Troponin normal.  BNP slightly elevated. [JK]  1951 Chest x-ray without acute findings. [JK]  2120 Case discussed with Dr Maryjean Ka [JK]    Clinical Course User Index [JK] Linwood Dibbles, MD                                 Medical Decision Making Differential diagnosis includes but not limited to COPD pneumonia pneumothorax pleural effusion pulmonary embolism  Problems Addressed: COPD exacerbation  (HCC): acute illness or injury that poses a threat to life or bodily functions Hypoxia: acute illness or injury that poses a threat to life or bodily functions  Amount and/or Complexity of Data Reviewed Labs: ordered. Decision-making details documented in ED Course. Radiology: ordered and independent interpretation performed.  Risk Prescription drug management. Decision regarding hospitalization.   Patient presented to the ED for evaluation of shortness of breath.  Patient has history of COPD.  Patient had notable wheezing on arrival.  He was treated by EMS with magnesium albuterol Atrovent and steroids.  Patient improved but continued to have wheezing in the ED.  He does have persistent oxygen requirement.  No evidence of COVID flu or RSV.  No pneumonia noted on x-ray.  Doubt CHF patient does have a persistent oxygen requirement.  Will consult medicine for admission.  Treatment.        Final Clinical Impression(s) / ED Diagnoses Final diagnoses:  COPD exacerbation (HCC)  Hypoxia    Rx / DC Orders ED Discharge Orders     None         Linwood Dibbles, MD 10/09/23 2121

## 2023-10-09 NOTE — Assessment & Plan Note (Signed)
Advsied to quit

## 2023-10-09 NOTE — H&P (Addendum)
History and Physical    Patient: Jordan [name] UEA:[id number] DOB: 08/04/1956 DOA: 10/09/2023 DOS: the patient was seen and examined on 10/09/2023 PCP: Storm Frisk, MD  Patient coming from: Home  Chief Complaint:  Chief Complaint  Patient presents with   Shortness of Breath   HPI: Jordan [name] is a 67 y.o. male with medical history significant of COPD.  However patient does not use supplementary oxygen at home.  Patient actually reports rather good functional status in terms of getting up and around the house without any shortness of breath.  Patient actually works up ladders as well and actually fell down a ladder this past Friday suffering fracture of the left radius and ulna as per the patient as well as a laceration of the left hand finger.  The laceration was repaired about 3 days ago and patient just had brace put in for the left forearm.  Patient had an Ortho appointment today.  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  Does not have any significant trouble swallowing.  Patient has been started on concurrent chemotherapy and radiation since August 15, 2023.  Weekly.  However patient is currently on a 1 month "vacation".  Till December  Patient reports being in his usual state of health when he went to bed last evening as well as when he got up in the morning.  He had a good night sleep.  However as soon as he got up and started exerting, he felt marked sensation of shortness of breath.  Patient has a chronic cough scant expectoration.  No change in that no chest pain no fever no new fall.  Patient does report incidence of bilateral leg swelling a week ago or so which has since then resolved.  Patient came to the ER because of persistent sensation of shortness of breath.  And route, via EMS, patient received Solu-Medrol as well as bronchodilator therapy.  In the ER patient reports that treatments have not helped him much.  An attempt to ambulate him revealed  that the patient was hypoxic to mid 80s SpO2 as per report given to me by the ER provider.  Medical evaluation is sought.  Patient reports no new symptom development in the interim.  Patient brother at the bedside Review of Systems: As mentioned in the history of present illness. All other systems reviewed and are negative. Past Medical History:  Diagnosis Date   Allergy    seasonal   Aneurysm of infrarenal abdominal aorta (HCC)    4.2 cm 07/12/23   Arthritis    Colon cancer (HCC)    Compression fracture of thoracic vertebra with routine healing 04/30/2020   COPD (chronic obstructive pulmonary disease) (HCC)    Family history of adverse reaction to anesthesia    mother slow to awaken   GERD (gastroesophageal reflux disease)    Hepatitis    hepatitis c dx in remission per pt   Hypertension    Inguinal hernia    Multiple fractures 06/04/2020   Positive colorectal cancer screening using Cologuard test 08/27/2020   Substance abuse (HCC)    Wears glasses    for reading   Past Surgical History:  Procedure Laterality Date   COLONOSCOPY  11/11/2020   ENDOSCOPIC MUCOSAL RESECTION N/A 03/10/2021   Procedure: ENDOSCOPIC MUCOSAL RESECTION;  Surgeon: Lemar Lofty., MD;  Location: [facility];  Service: Gastroenterology;  Laterality: N/A;   ESOPHAGOGASTRODUODENOSCOPY (EGD) WITH PROPOFOL N/A 07/27/2023   Procedure: ESOPHAGOGASTRODUODENOSCOPY (EGD) WITH PROPOFOL;  Surgeon: Lemar Lofty., MD;  Location: [facility];  Service: Gastroenterology;  Laterality: N/A;   EUS N/A 07/27/2023   Procedure: UPPER ENDOSCOPIC ULTRASOUND (EUS) RADIAL;  Surgeon: Lemar Lofty., MD;  Location: [facility];  Service: Gastroenterology;  Laterality: N/A;   FLEXIBLE SIGMOIDOSCOPY N/A 03/10/2021   Procedure: FLEXIBLE SIGMOIDOSCOPY;  Surgeon: Meridee Score Netty Starring., MD;  Location: [facility];  Service: Gastroenterology;  Laterality: N/A;   HEMOSTASIS CLIP PLACEMENT  03/10/2021    Procedure: HEMOSTASIS CLIP PLACEMENT;  Surgeon: Lemar Lofty., MD;  Location: [facility];  Service: Gastroenterology;;   INGUINAL HERNIA REPAIR Right 01/19/2021   Procedure: LAPAROSCOPIC RIGHT INGUINAL HERNIA REPAIR WITH MESH;  Surgeon: Stechschulte, Hyman Hopes, MD;  Location: [facility] [place];  Service: General;  Laterality: Right;   IR IMAGING GUIDED PORT INSERTION  08/17/2023   obstruction removed as a child   swallowed a peanut   POLYPECTOMY  03/10/2021   Procedure: POLYPECTOMY;  Surgeon: Lemar Lofty., MD;  Location: [facility];  Service: Gastroenterology;;   POLYPECTOMY     SUBMUCOSAL LIFTING INJECTION  03/10/2021   Procedure: SUBMUCOSAL LIFTING INJECTION;  Surgeon: Lemar Lofty., MD;  Location: [facility];  Service: Gastroenterology;;   Social History:  reports that he quit smoking about 49 years ago. His smoking use included cigarettes. He started smoking about 2 years ago. He has never used smokeless tobacco. He reports that he does not currently use alcohol after a past usage of about 1.0 standard drink of alcohol per week. He reports current drug use. Frequency: 1.00 time per week. Drug: Cocaine.  No Known Allergies  Family History  Problem Relation Age of Onset   Colon polyps Father    Stomach cancer Father 17   Prostate cancer Father        dx late 45s   Lung cancer Sister 58       small cell; smoking hx   Pancreatic cancer Sister 96   Colon polyps Brother        less than 10 lifetime   Colon polyps Brother        less than 10 lifetime   Lung cancer Maternal Aunt        d. 58s; smoking hx   Prostate cancer Maternal Uncle        x4 mat uncles; dx >50; no mets   Kidney cancer Maternal Uncle 36   Melanoma Paternal Uncle        dx >50; systemic therapy   Lung cancer Paternal Uncle        dx >50   Breast cancer Cousin        mat male cousin; dx <50   Uterine cancer Cousin        mat male cousin dx <50   Esophageal cancer  Cousin        mat male cousin dx >50   Colon cancer Neg Hx    Rectal cancer Neg Hx    Inflammatory bowel disease Neg Hx    Liver disease Neg Hx     Prior to Admission medications   Medication Sig Start Date End Date Taking? Authorizing Provider  albuterol (VENTOLIN HFA) 108 (90 Base) MCG/ACT inhaler Inhale 2 puffs into the lungs every 6 (six) hours as needed for wheezing or shortness of breath. 07/04/23   Storm Frisk, MD  amLODipine (NORVASC) 10 MG tablet Take 1 tablet (10 mg total) by mouth daily. 07/04/23   Storm Frisk, MD  fluticasone-salmeterol (ADVAIR  HFA) 115-21 MCG/ACT inhaler Inhale 2 puffs into the lungs 2 (two) times daily. 05/16/23   Storm Frisk, MD  guaiFENesin-codeine 100-10 MG/5ML syrup Take 5 mLs by mouth every 6 (six) hours as needed for cough. 09/05/23   Malachy Mood, MD  lidocaine-prilocaine (EMLA) cream Apply 1 Application topically as needed. 08/14/23   Malachy Mood, MD  Multiple Vitamin (MULTIVITAMIN ADULT PO) Take by mouth.    [provider]  naproxen (NAPROSYN) 500 MG tablet Take 1 tablet (500 mg total) by mouth 2 (two) times daily. 10/06/23   Barrett, Horald Chestnut, PA-C  omeprazole (PRILOSEC) 40 MG capsule Take 1 capsule (40 mg total) by mouth daily before dinner. 07/04/23   Storm Frisk, MD  ondansetron (ZOFRAN) 8 MG tablet Take 1 tablet (8 mg total) by mouth every 8 (eight) hours as needed for nausea or vomiting. Start on the third day after chemotherapy. 08/11/23   Malachy Mood, MD  oxyCODONE-acetaminophen (PERCOCET/ROXICET) 5-325 MG tablet Take 1 tablet by mouth every 6 (six) hours as needed for severe pain (pain score 7-10). 10/06/23   Barrett, Horald Chestnut, PA-C  prochlorperazine (COMPAZINE) 10 MG tablet Take 1 tablet (10 mg total) by mouth every 6 (six) hours as needed for nausea or vomiting. 08/11/23   Malachy Mood, MD  promethazine-dextromethorphan (PROMETHAZINE-DM) 6.25-15 MG/5ML syrup Take 5 mLs by mouth 4 (four) times daily as needed for cough. 07/04/23    Storm Frisk, MD  sildenafil (VIAGRA) 100 MG tablet Take 1 tablet (100 mg total) by mouth daily as needed for erectile dysfunction. 10/11/22 10/11/23  Storm Frisk, MD  sucralfate (CARAFATE) 1 g tablet Take 1 tablet (1 g total) by mouth 4 (four) times daily -  with meals and at bedtime. 08/22/23   Malachy Mood, MD    Physical Exam: Vitals:   10/09/23 1800 10/09/23 2000 10/09/23 2015 10/09/23 2019  BP: 138/70 (!) 176/101 (!) 162/93   Pulse: 98 97 99   Resp: (!) 23 (!) 24    Temp:    98.1 F (36.7 C)  TempSrc:    Oral  SpO2: 94% 96% 97%   Weight:      Height:       General: Patient is alert and awake and coherent.  However tachypnea is noticeable, even while patient at rest on 2lpm. No other distress apparent Respiratory exam: Left-sided breath sounds are felt to be reduced with 5 in the left base.  There is good excursion on the right hemithorax.  There are expiratory wheezes heard mostly on the left side. Cardiovascular exam S1-S2 normal Abdomen all quadrants soft nontender Extremities warm without edema. Left forearm in brace with left finger in dressing . Distal functin intact. Data Reviewed:  Labs on Admission:  Results for orders placed or performed during the hospital encounter of 10/09/23 (from the past 24 hour(s))  Comprehensive metabolic panel     Status: Abnormal   Collection Time: 10/09/23  5:11 PM  Result Value Ref Range   Sodium 130 (L) 135 - 145 mmol/L   Potassium 4.1 3.5 - 5.1 mmol/L   Chloride 97 (L) 98 - 111 mmol/L   CO2 24 22 - 32 mmol/L   Glucose, Bld 174 (H) 70 - 99 mg/dL   BUN 30 (H) 8 - 23 mg/dL   Creatinine, Ser 8.46 (H) 0.61 - 1.24 mg/dL   Calcium 8.6 (L) 8.9 - 10.3 mg/dL   Total Protein 7.4 6.5 - 8.1 g/dL   Albumin 3.0 (L) 3.5 -  5.0 g/dL   AST 41 15 - 41 U/L   ALT 51 (H) 0 - 44 U/L   Alkaline Phosphatase 113 38 - 126 U/L   Total Bilirubin 0.7 <1.2 mg/dL   GFR, Estimated 59 (L) >60 mL/min   Anion gap 9 5 - 15  CBC     Status: Abnormal    Collection Time: 10/09/23  5:11 PM  Result Value Ref Range   WBC 8.8 4.0 - 10.5 K/uL   RBC 3.88 (L) 4.22 - 5.81 MIL/uL   Hemoglobin 11.5 (L) 13.0 - 17.0 g/dL   HCT 52.8 (L) 41.3 - 24.4 %   MCV 87.4 80.0 - 100.0 fL   MCH 29.6 26.0 - 34.0 pg   MCHC 33.9 30.0 - 36.0 g/dL   RDW 01.0 (H) 27.2 - 53.6 %   Platelets 283 150 - 400 K/uL   nRBC 0.0 0.0 - 0.2 %  Brain natriuretic peptide     Status: Abnormal   Collection Time: 10/09/23  5:11 PM  Result Value Ref Range   B Natriuretic Peptide 214.0 (H) 0.0 - 100.0 pg/mL  Troponin I (High Sensitivity)     Status: None   Collection Time: 10/09/23  5:11 PM  Result Value Ref Range   Troponin I (High Sensitivity) 16 <18 ng/L  I-Stat CG4 Lactic Acid     Status: None   Collection Time: 10/09/23  5:20 PM  Result Value Ref Range   Lactic Acid, Venous 1.4 0.5 - 1.9 mmol/L  Resp panel by RT-PCR (RSV, Flu A&B, Covid) Anterior Nasal Swab     Status: None   Collection Time: 10/09/23  6:23 PM   Specimen: Anterior Nasal Swab  Result Value Ref Range   SARS Coronavirus 2 by RT PCR NEGATIVE NEGATIVE   Influenza A by PCR NEGATIVE NEGATIVE   Influenza B by PCR NEGATIVE NEGATIVE   Resp Syncytial Virus by PCR NEGATIVE NEGATIVE  Troponin I (High Sensitivity)     Status: None   Collection Time: 10/09/23  6:23 PM  Result Value Ref Range   Troponin I (High Sensitivity) 17 <18 ng/L  I-Stat CG4 Lactic Acid     Status: None   Collection Time: 10/09/23  6:34 PM  Result Value Ref Range   Lactic Acid, Venous 1.6 0.5 - 1.9 mmol/L   Basic Metabolic Panel: Recent Labs  Lab 10/09/23 1711  NA 130*  K 4.1  CL 97*  CO2 24  GLUCOSE 174*  BUN 30*  CREATININE 1.32*  CALCIUM 8.6*   Liver Function Tests: Recent Labs  Lab 10/09/23 1711  AST 41  ALT 51*  ALKPHOS 113  BILITOT 0.7  PROT 7.4  ALBUMIN 3.0*   No results for input(s): "LIPASE", "AMYLASE" in the last 168 hours. No results for input(s): "AMMONIA" in the last 168 hours. CBC: Recent Labs  Lab  10/09/23 1711  WBC 8.8  HGB 11.5*  HCT 33.9*  MCV 87.4  PLT 283   Cardiac Enzymes: Recent Labs  Lab 10/09/23 1711 10/09/23 1823  TROPONINIHS 16 17    BNP (last 3 results) No results for input(s): "PROBNP" in the last 8760 hours. CBG: No results for input(s): "GLUCAP" in the last 168 hours.  Radiological Exams on Admission:  DG Chest Port 1 View  Result Date: 10/09/2023 CLINICAL DATA:  Shortness of breath EXAM: PORTABLE CHEST 1 VIEW COMPARISON:  06/04/2020 FINDINGS: Right central line tip at the cavoatrial junction. No pneumothorax. Heart and mediastinal contours within normal limits. No confluent  opacities or effusions. No acute bony abnormality. IMPRESSION: No acute cardiopulmonary disease. Electronically Signed   By: Charlett Nose M.D.   On: 10/09/2023 16:59    EKG: Independently reviewed. Sinus tachycardia.   Assessment and Plan: * Hypoxic respiratory failure (HCC) Due to COPD exacerbation.Patient already received Solu-Medrol treatment via EMS.  I will continue DuoNeb therapy for bronchodilator and monitor patient response to above treatment.  T/r/o LLL PNA . cover empirically with ceftriaxone and azithromycin for pneumonia.  Will check a CAT scan of the chest.    Given patient cancer diagnosis as well as recent left forearm fracture, see HPI, consideration of venous thromboembolism.  However exam findings at this time support a localized process in the left lung field with COPD.  Will wait noncontrast CT findings before pursuing alternative diagnosis.  Hyponatremia Mild chronic asymptomatic.  Check serum osmolality, check urine sodium.  Associated with AKI.  Patient got 500 cc LR bolus in the ER.  Will monitor response to same.  Patient does not report any nausea vomiting or diarrhea.  Cocaine abuse (HCC) Advsied to quit  Esophageal mass Per dr. Mosetta Putt on 09/18/2023 :Esophageal Cancer Undergoing concurrent chemo-radiation therapy. Tolerating treatment well with no  dysphagia. Noted taste changes due to chemotherapy. -Continue current chemo-radiation regimen, she will complete later this week  -Plan for a different type of chemotherapy (FOLFOX) to start after Thanksgiving (first week of December).   Left ulnar fracture and finger laceration mechanical fall from ladder - see ER notes from 10/06/2023  Home med rec pending.   Advance Care Planning:   Code Status: Prior full code.   Consults: none at this time.  Family Communication: brother at bedside, allquestions answered.  Severity of Illness: The appropriate patient status for this patient is INPATIENT. Inpatient status is judged to be reasonable and necessary in order to provide the required intensity of service to ensure the patient's safety. The patient's presenting symptoms, physical exam findings, and initial radiographic and laboratory data in the context of their chronic comorbidities is felt to place them at high risk for further clinical deterioration. Furthermore, it is not anticipated that the patient will be medically stable for discharge from the hospital within 2 midnights of admission.   * I certify that at the point of admission it is my clinical judgment that the patient will require inpatient hospital care spanning beyond 2 midnights from the point of admission due to high intensity of service, high risk for further deterioration and high frequency of surveillance required.*  Author: Nolberto Hanlon, MD 10/09/2023 9:43 PM  For on call review [email or web address].

## 2023-10-09 NOTE — ED Notes (Addendum)
Floor coverage text paged via Amion to discuss tachypnea, lethargy, increased work of breathing, accessory muscle use, and diaphoresis. ABG pending. Sats 99% on 5L.

## 2023-10-09 NOTE — Assessment & Plan Note (Signed)
Mild chronic asymptomatic.  Check serum osmolality, check urine sodium.  Associated with AKI.  Patient got 500 cc LR bolus in the ER.  Will monitor response to same.  Patient does not report any nausea vomiting or diarrhea.

## 2023-10-09 NOTE — ED Notes (Signed)
ED TO INPATIENT HANDOFF REPORT  Name/Age/Gender Jordan [name] 67 y.o. male  Code Status    Code Status Orders  (From admission, onward)           Start     Ordered   10/09/23 2200  Full code  Continuous       Question:  By:  Answer:  Consent: discussion documented in EHR   10/09/23 2200           Code Status History     Date Active Date Inactive Code Status Order ID Comments User Context   08/17/2023 1200 08/18/2023 0513 Full Code [id number]  Roanna Banning, MD HOV   08/04/2023 1222 08/05/2023 0512 Full Code [id number]  Oley Balm, MD HOV   04/03/2020 2243 04/06/2020 2301 Full Code [id number]  Berna Bue, MD ED       Home/SNF/Other Home  Chief Complaint Hypoxia [R09.02]  Level of Care/Admitting Diagnosis ED Disposition     ED Disposition  Admit   Condition  --   Comment  Hospital [facility]: [facility] [[id number]]  Level of Care: Telemetry [5]  Admit to tele based on following criteria: Monitor for Ischemic changes  May admit patient to [facility] or [facility] if equivalent level of care is available:: No  Covid Evaluation: Confirmed COVID Negative  Diagnosis: Hypoxia [300808]  Admitting Physician: Nolberto Hanlon [[id number]]  Attending Physician: Nolberto Hanlon [[id number]]  Certification:: I certify this patient will need inpatient services for at least 2 midnights  Expected Medical Readiness: 10/11/2023          Medical History Past Medical History:  Diagnosis Date   Allergy    seasonal   Aneurysm of infrarenal abdominal aorta (HCC)    4.2 cm 07/12/23   Arthritis    Colon cancer (HCC)    Compression fracture of thoracic vertebra with routine healing 04/30/2020   COPD (chronic obstructive pulmonary disease) (HCC)    Family history of adverse reaction to anesthesia    mother slow to awaken   GERD (gastroesophageal reflux disease)    Hepatitis    hepatitis c dx in remission per pt   Hypertension    Inguinal hernia    Multiple  fractures 06/04/2020   Positive colorectal cancer screening using Cologuard test 08/27/2020   Substance abuse (HCC)    Wears glasses    for reading    Allergies No Known Allergies  IV Location/Drains/Wounds Patient Lines/Drains/Airways Status     Active Line/Drains/Airways     Name Placement date Placement time Site Days   Implanted Port 08/17/23 Right Chest 08/17/23  1136  Chest  53   Incision - 3 Ports Abdomen Umbilicus Right;Lateral Left;Lateral 01/19/21  0923  -- 993   Wound / Incision (Open or Dehisced) 08/04/23 Incision - Open Sternum Lower sternal biopsy 08/04/23  1202  Sternum  66            Labs/Imaging Results for orders placed or performed during the hospital encounter of 10/09/23 (from the past 48 hour(s))  Comprehensive metabolic panel     Status: Abnormal   Collection Time: 10/09/23  5:11 PM  Result Value Ref Range   Sodium 130 (L) 135 - 145 mmol/L   Potassium 4.1 3.5 - 5.1 mmol/L   Chloride 97 (L) 98 - 111 mmol/L   CO2 24 22 - 32 mmol/L   Glucose, Bld 174 (H) 70 - 99 mg/dL    Comment: Glucose reference range applies only to samples taken  after fasting for at least 8 hours.   BUN 30 (H) 8 - 23 mg/dL   Creatinine, Ser 1.61 (H) 0.61 - 1.24 mg/dL   Calcium 8.6 (L) 8.9 - 10.3 mg/dL   Total Protein 7.4 6.5 - 8.1 g/dL   Albumin 3.0 (L) 3.5 - 5.0 g/dL   AST 41 15 - 41 U/L   ALT 51 (H) 0 - 44 U/L   Alkaline Phosphatase 113 38 - 126 U/L   Total Bilirubin 0.7 <1.2 mg/dL   GFR, Estimated 59 (L) >60 mL/min    Comment: (NOTE) Calculated using the CKD-EPI Creatinine Equation (2021)    Anion gap 9 5 - 15    Comment: Performed at [facility], [address], [place], [place] [address]  CBC     Status: Abnormal   Collection Time: 10/09/23  5:11 PM  Result Value Ref Range   WBC 8.8 4.0 - 10.5 K/uL   RBC 3.88 (L) 4.22 - 5.81 MIL/uL   Hemoglobin 11.5 (L) 13.0 - 17.0 g/dL   HCT 54.0 (L) 98.1 - 19.1 %   MCV 87.4 80.0 - 100.0 fL   MCH 29.6 26.0 -  34.0 pg   MCHC 33.9 30.0 - 36.0 g/dL   RDW 47.8 (H) 29.5 - 62.1 %   Platelets 283 150 - 400 K/uL   nRBC 0.0 0.0 - 0.2 %    Comment: Performed at [facility], [address] [place], [place] [address]  Brain natriuretic peptide     Status: Abnormal   Collection Time: 10/09/23  5:11 PM  Result Value Ref Range   B Natriuretic Peptide 214.0 (H) 0.0 - 100.0 pg/mL    Comment: Performed at [facility], [address] [place], [place] [address]  Troponin I (High Sensitivity)     Status: None   Collection Time: 10/09/23  5:11 PM  Result Value Ref Range   Troponin I (High Sensitivity) 16 <18 ng/L    Comment: (NOTE) Elevated high sensitivity troponin I (hsTnI) values and significant  changes across serial measurements may suggest ACS but many other  chronic and acute conditions are known to elevate hsTnI results.  Refer to the "Links" section for chest pain algorithms and additional  guidance. Performed at [facility], [address] [place], [place] [address]   I-Stat CG4 Lactic Acid     Status: None   Collection Time: 10/09/23  5:20 PM  Result Value Ref Range   Lactic Acid, Venous 1.4 0.5 - 1.9 mmol/L  Resp panel by RT-PCR (RSV, Flu A&B, Covid) Anterior Nasal Swab     Status: None   Collection Time: 10/09/23  6:23 PM   Specimen: Anterior Nasal Swab  Result Value Ref Range   SARS Coronavirus 2 by RT PCR NEGATIVE NEGATIVE    Comment: (NOTE) SARS-CoV-2 target nucleic acids are NOT DETECTED.  The SARS-CoV-2 RNA is generally detectable in upper respiratory specimens during the acute phase of infection. The lowest concentration of SARS-CoV-2 viral copies this assay can detect is 138 copies/mL. A negative result does not preclude SARS-Cov-2 infection and should not be used as the sole basis for treatment or other patient management decisions. A negative result may occur with  improper specimen collection/handling, submission of  specimen other than nasopharyngeal swab, presence of viral mutation(s) within the areas targeted by this assay, and inadequate number of viral copies(<138 copies/mL). A negative result must be combined with clinical observations, patient history, and epidemiological  information. The expected result is Negative.  Fact Sheet for Patients:  BloggerCourse.com  Fact Sheet for Healthcare Providers:  SeriousBroker.it  This test is no t yet approved or cleared by the [place] FDA and  has been authorized for detection and/or diagnosis of SARS-CoV-2 by FDA under an Emergency Use Authorization (EUA). This EUA will remain  in effect (meaning this test can be used) for the duration of the COVID-19 declaration under Section 564(b)(1) of the Act, 21 U.S.C.section 360bbb-3(b)(1), unless the authorization is terminated  or revoked sooner.       Influenza A by PCR NEGATIVE NEGATIVE   Influenza B by PCR NEGATIVE NEGATIVE    Comment: (NOTE) The Xpert Xpress SARS-CoV-2/FLU/RSV plus assay is intended as an aid in the diagnosis of influenza from Nasopharyngeal swab specimens and should not be used as a sole basis for treatment. Nasal washings and aspirates are unacceptable for Xpert Xpress SARS-CoV-2/FLU/RSV testing.  Fact Sheet for Patients: BloggerCourse.com  Fact Sheet for Healthcare Providers: SeriousBroker.it  This test is not yet approved or cleared by the [place] FDA and has been authorized for detection and/or diagnosis of SARS-CoV-2 by FDA under an Emergency Use Authorization (EUA). This EUA will remain in effect (meaning this test can be used) for the duration of the COVID-19 declaration under Section 564(b)(1) of the Act, 21 U.S.C. section 360bbb-3(b)(1), unless the authorization is terminated or revoked.     Resp Syncytial Virus by PCR NEGATIVE NEGATIVE    Comment:  (NOTE) Fact Sheet for Patients: BloggerCourse.com  Fact Sheet for Healthcare Providers: SeriousBroker.it  This test is not yet approved or cleared by the [place] FDA and has been authorized for detection and/or diagnosis of SARS-CoV-2 by FDA under an Emergency Use Authorization (EUA). This EUA will remain in effect (meaning this test can be used) for the duration of the COVID-19 declaration under Section 564(b)(1) of the Act, 21 U.S.C. section 360bbb-3(b)(1), unless the authorization is terminated or revoked.  Performed at [facility], [address] [place], [place] [address]   Troponin I (High Sensitivity)     Status: None   Collection Time: 10/09/23  6:23 PM  Result Value Ref Range   Troponin I (High Sensitivity) 17 <18 ng/L    Comment: (NOTE) Elevated high sensitivity troponin I (hsTnI) values and significant  changes across serial measurements may suggest ACS but many other  chronic and acute conditions are known to elevate hsTnI results.  Refer to the "Links" section for chest pain algorithms and additional  guidance. Performed at [facility], [address] [place], [place] [address]   I-Stat CG4 Lactic Acid     Status: None   Collection Time: 10/09/23  6:34 PM  Result Value Ref Range   Lactic Acid, Venous 1.6 0.5 - 1.9 mmol/L   DG Chest Port 1 View  Result Date: 10/09/2023 CLINICAL DATA:  Shortness of breath EXAM: PORTABLE CHEST 1 VIEW COMPARISON:  06/04/2020 FINDINGS: Right central line tip at the cavoatrial junction. No pneumothorax. Heart and mediastinal contours within normal limits. No confluent opacities or effusions. No acute bony abnormality. IMPRESSION: No acute cardiopulmonary disease. Electronically Signed   By: Charlett Nose M.D.   On: 10/09/2023 16:59    Pending Labs Unresulted Labs (From admission, onward)     Start     Ordered   10/16/23 0500  Creatinine,  serum  (enoxaparin (LOVENOX)    CrCl >/= 30 ml/min)  Weekly,   R  Comments: while on enoxaparin therapy    10/09/23 2200   10/10/23 0500  Cortisol-am, blood  Tomorrow morning,   R        10/09/23 2150   10/10/23 0500  TSH  Tomorrow morning,   R        10/09/23 2150   10/10/23 0500  APTT  Tomorrow morning,   R        10/09/23 2200   10/10/23 0500  Protime-INR  Tomorrow morning,   R        10/09/23 2200   10/10/23 0500  Basic metabolic panel  Tomorrow morning,   R        10/09/23 2200   10/10/23 0500  CBC  Tomorrow morning,   R        10/09/23 2200   10/09/23 2159  CBC  (enoxaparin (LOVENOX)    CrCl >/= 30 ml/min)  Once,   R       Comments: Baseline for enoxaparin therapy IF NOT ALREADY DRAWN.  Notify MD if PLT < 100 K.    10/09/23 2200   10/09/23 2159  Creatinine, serum  (enoxaparin (LOVENOX)    CrCl >/= 30 ml/min)  Once,   R       Comments: Baseline for enoxaparin therapy IF NOT ALREADY DRAWN.    10/09/23 2200   10/09/23 2159  HIV Antibody (routine testing w rflx)  (HIV Antibody (Routine testing w reflex) panel)  Once,   R        10/09/23 2200   10/09/23 2152  Creatinine, urine, random  Once,   URGENT        10/09/23 2151   10/09/23 2151  Urinalysis, Complete w Microscopic -Urine, Clean Catch  Once,   URGENT       Question:  Specimen Source  Answer:  Urine, Clean Catch   10/09/23 2151   10/09/23 2150  Sodium, urine, random  Once,   URGENT        10/09/23 2150   10/09/23 2150  Osmolality  Add-on,   AD        10/09/23 2150   10/09/23 1618  Blood culture (routine x 2)  BLOOD CULTURE X 2,   R (with STAT occurrences)      10/09/23 1618            Vitals/Pain Today's Vitals   10/09/23 1800 10/09/23 2000 10/09/23 2015 10/09/23 2019  BP: 138/70 (!) 176/101 (!) 162/93   Pulse: 98 97 99   Resp: (!) 23 (!) 24    Temp:    98.1 F (36.7 C)  TempSrc:    Oral  SpO2: 94% 96% 97%   Weight:      Height:      PainSc:        Isolation Precautions No active  isolations  Medications Medications  labetalol (NORMODYNE) injection 20 mg (has no administration in time range)  hydrALAZINE (APRESOLINE) injection 10 mg (has no administration in time range)  ipratropium-albuterol (DUONEB) 0.5-2.5 (3) MG/3ML nebulizer solution 3 mL (has no administration in time range)  cefTRIAXone (ROCEPHIN) 1 g in sodium chloride 0.9 % 100 mL IVPB (has no administration in time range)  azithromycin (ZITHROMAX) 500 mg in sodium chloride 0.9 % 250 mL IVPB (has no administration in time range)  mometasone-formoterol (DULERA) 200-5 MCG/ACT inhaler 2 puff (has no administration in time range)  pantoprazole (PROTONIX) EC tablet 40 mg (has no administration in time range)  oxyCODONE-acetaminophen (PERCOCET/ROXICET) 5-325 MG per  tablet 1 tablet (has no administration in time range)  enoxaparin (LOVENOX) injection 40 mg (has no administration in time range)  acetaminophen (TYLENOL) tablet 650 mg (has no administration in time range)    Or  acetaminophen (TYLENOL) suppository 650 mg (has no administration in time range)  polyethylene glycol (MIRALAX / GLYCOLAX) packet 17 g (has no administration in time range)  sodium chloride flush (NS) 0.9 % injection 3 mL (has no administration in time range)  methylPREDNISolone sodium succinate (SOLU-MEDROL) 40 mg/mL injection 40 mg (has no administration in time range)  lactated ringers bolus 500 mL (0 mLs Intravenous Stopped 10/09/23 2002)  albuterol (PROVENTIL) (2.5 MG/3ML) 0.083% nebulizer solution 5 mg (5 mg Nebulization Given 10/09/23 2209)    Mobility walks

## 2023-10-09 NOTE — ED Triage Notes (Signed)
Pt arrived via [facility] from home. Symptoms started at 0900. Hx of COPD and esophageal cancer. EMS reported Diminished breathing and wheezing. 83% reported by EMS on room air and 95% of Neb. Duo Neb given by EMS. 10 albuterol, 1 Atrovent, 125 Solu-Medrol, and 2 g Mag. Larey Seat off ladder Caleen Essex and reports he has felt bad since after the fall.

## 2023-10-09 NOTE — ED Notes (Signed)
Patient transported to CT 

## 2023-10-09 NOTE — ED Notes (Signed)
Peak flow 80

## 2023-10-10 ENCOUNTER — Inpatient Hospital Stay (HOSPITAL_COMMUNITY): Payer: 59

## 2023-10-10 ENCOUNTER — Encounter (HOSPITAL_COMMUNITY): Payer: Self-pay | Admitting: Internal Medicine

## 2023-10-10 ENCOUNTER — Other Ambulatory Visit: Payer: Self-pay

## 2023-10-10 ENCOUNTER — Other Ambulatory Visit (HOSPITAL_COMMUNITY): Payer: Self-pay

## 2023-10-10 ENCOUNTER — Encounter: Payer: Self-pay | Admitting: Hematology

## 2023-10-10 ENCOUNTER — Inpatient Hospital Stay: Payer: 59

## 2023-10-10 ENCOUNTER — Inpatient Hospital Stay (HOSPITAL_BASED_OUTPATIENT_CLINIC_OR_DEPARTMENT_OTHER): Payer: 59 | Admitting: Hematology

## 2023-10-10 VITALS — BP 169/88 | HR 100 | Temp 98.9°F | Resp 19 | Wt 166.8 lb

## 2023-10-10 DIAGNOSIS — Z5111 Encounter for antineoplastic chemotherapy: Secondary | ICD-10-CM | POA: Insufficient documentation

## 2023-10-10 DIAGNOSIS — Z923 Personal history of irradiation: Secondary | ICD-10-CM | POA: Diagnosis not present

## 2023-10-10 DIAGNOSIS — C155 Malignant neoplasm of lower third of esophagus: Secondary | ICD-10-CM

## 2023-10-10 DIAGNOSIS — C159 Malignant neoplasm of esophagus, unspecified: Secondary | ICD-10-CM | POA: Diagnosis not present

## 2023-10-10 DIAGNOSIS — Z9221 Personal history of antineoplastic chemotherapy: Secondary | ICD-10-CM | POA: Insufficient documentation

## 2023-10-10 DIAGNOSIS — Z95828 Presence of other vascular implants and grafts: Secondary | ICD-10-CM

## 2023-10-10 DIAGNOSIS — J9601 Acute respiratory failure with hypoxia: Principal | ICD-10-CM

## 2023-10-10 DIAGNOSIS — M8448XA Pathological fracture, other site, initial encounter for fracture: Secondary | ICD-10-CM | POA: Diagnosis not present

## 2023-10-10 DIAGNOSIS — I2510 Atherosclerotic heart disease of native coronary artery without angina pectoris: Secondary | ICD-10-CM | POA: Diagnosis not present

## 2023-10-10 DIAGNOSIS — C189 Malignant neoplasm of colon, unspecified: Secondary | ICD-10-CM

## 2023-10-10 DIAGNOSIS — J4489 Other specified chronic obstructive pulmonary disease: Secondary | ICD-10-CM

## 2023-10-10 DIAGNOSIS — R0902 Hypoxemia: Secondary | ICD-10-CM

## 2023-10-10 DIAGNOSIS — J439 Emphysema, unspecified: Secondary | ICD-10-CM | POA: Diagnosis not present

## 2023-10-10 DIAGNOSIS — R059 Cough, unspecified: Secondary | ICD-10-CM | POA: Insufficient documentation

## 2023-10-10 DIAGNOSIS — I700 Atherosclerosis of aorta: Secondary | ICD-10-CM | POA: Insufficient documentation

## 2023-10-10 DIAGNOSIS — R062 Wheezing: Secondary | ICD-10-CM | POA: Diagnosis not present

## 2023-10-10 DIAGNOSIS — Z85038 Personal history of other malignant neoplasm of large intestine: Secondary | ICD-10-CM | POA: Diagnosis not present

## 2023-10-10 DIAGNOSIS — S2243XA Multiple fractures of ribs, bilateral, initial encounter for closed fracture: Secondary | ICD-10-CM | POA: Diagnosis not present

## 2023-10-10 DIAGNOSIS — J441 Chronic obstructive pulmonary disease with (acute) exacerbation: Principal | ICD-10-CM

## 2023-10-10 LAB — CBC WITH DIFFERENTIAL (CANCER CENTER ONLY)
Abs Immature Granulocytes: 0.45 10*3/uL — ABNORMAL HIGH (ref 0.00–0.07)
Basophils Absolute: 0.1 10*3/uL (ref 0.0–0.1)
Basophils Relative: 1 %
Eosinophils Absolute: 0 10*3/uL (ref 0.0–0.5)
Eosinophils Relative: 0 %
HCT: 35.7 % — ABNORMAL LOW (ref 39.0–52.0)
Hemoglobin: 12.3 g/dL — ABNORMAL LOW (ref 13.0–17.0)
Immature Granulocytes: 4 %
Lymphocytes Relative: 4 %
Lymphs Abs: 0.4 10*3/uL — ABNORMAL LOW (ref 0.7–4.0)
MCH: 29.8 pg (ref 26.0–34.0)
MCHC: 34.5 g/dL (ref 30.0–36.0)
MCV: 86.4 fL (ref 80.0–100.0)
Monocytes Absolute: 0.9 10*3/uL (ref 0.1–1.0)
Monocytes Relative: 8 %
Neutro Abs: 8.9 10*3/uL — ABNORMAL HIGH (ref 1.7–7.7)
Neutrophils Relative %: 83 %
Platelet Count: 359 10*3/uL (ref 150–400)
RBC: 4.13 MIL/uL — ABNORMAL LOW (ref 4.22–5.81)
RDW: 17.5 % — ABNORMAL HIGH (ref 11.5–15.5)
WBC Count: 10.7 10*3/uL — ABNORMAL HIGH (ref 4.0–10.5)
nRBC: 0.4 % — ABNORMAL HIGH (ref 0.0–0.2)

## 2023-10-10 LAB — RAPID URINE DRUG SCREEN, HOSP PERFORMED
Amphetamines: NOT DETECTED
Barbiturates: NOT DETECTED
Benzodiazepines: NOT DETECTED
Cocaine: POSITIVE — AB
Opiates: NOT DETECTED
Tetrahydrocannabinol: NOT DETECTED

## 2023-10-10 LAB — CMP (CANCER CENTER ONLY)
ALT: 47 U/L — ABNORMAL HIGH (ref 0–44)
AST: 30 U/L (ref 15–41)
Albumin: 3.3 g/dL — ABNORMAL LOW (ref 3.5–5.0)
Alkaline Phosphatase: 127 U/L — ABNORMAL HIGH (ref 38–126)
Anion gap: 8 (ref 5–15)
BUN: 30 mg/dL — ABNORMAL HIGH (ref 8–23)
CO2: 27 mmol/L (ref 22–32)
Calcium: 9.4 mg/dL (ref 8.9–10.3)
Chloride: 101 mmol/L (ref 98–111)
Creatinine: 1.23 mg/dL (ref 0.61–1.24)
GFR, Estimated: >60 mL/min (ref >60)
Glucose, Bld: 154 mg/dL — ABNORMAL HIGH (ref 70–99)
Potassium: 4 mmol/L (ref 3.5–5.1)
Sodium: 136 mmol/L (ref 135–145)
Total Bilirubin: 0.4 mg/dL (ref <1.2)
Total Protein: 7.4 g/dL (ref 6.5–8.1)

## 2023-10-10 LAB — CBC
HCT: 34 % — ABNORMAL LOW (ref 39.0–52.0)
HCT: 36.6 % — ABNORMAL LOW (ref 39.0–52.0)
Hemoglobin: 11.4 g/dL — ABNORMAL LOW (ref 13.0–17.0)
Hemoglobin: 11.9 g/dL — ABNORMAL LOW (ref 13.0–17.0)
MCH: 29.4 pg (ref 26.0–34.0)
MCH: 29.7 pg (ref 26.0–34.0)
MCHC: 32.5 g/dL (ref 30.0–36.0)
MCHC: 33.5 g/dL (ref 30.0–36.0)
MCV: 88.5 fL (ref 80.0–100.0)
MCV: 90.4 fL (ref 80.0–100.0)
Platelets: 271 10*3/uL (ref 150–400)
Platelets: 303 10*3/uL (ref 150–400)
RBC: 3.84 MIL/uL — ABNORMAL LOW (ref 4.22–5.81)
RBC: 4.05 MIL/uL — ABNORMAL LOW (ref 4.22–5.81)
RDW: 17.6 % — ABNORMAL HIGH (ref 11.5–15.5)
RDW: 17.6 % — ABNORMAL HIGH (ref 11.5–15.5)
WBC: 8.6 10*3/uL (ref 4.0–10.5)
WBC: 9.3 10*3/uL (ref 4.0–10.5)
nRBC: 0 % (ref 0.0–0.2)
nRBC: 0.2 % (ref 0.0–0.2)

## 2023-10-10 LAB — URINALYSIS, COMPLETE (UACMP) WITH MICROSCOPIC
Bacteria, UA: NONE SEEN
Bilirubin Urine: NEGATIVE
Glucose, UA: 50 mg/dL — AB
Hgb urine dipstick: NEGATIVE
Ketones, ur: NEGATIVE mg/dL
Leukocytes,Ua: NEGATIVE
Nitrite: NEGATIVE
Protein, ur: NEGATIVE mg/dL
Specific Gravity, Urine: 1.031 — ABNORMAL HIGH (ref 1.005–1.030)
pH: 6 (ref 5.0–8.0)

## 2023-10-10 LAB — CORTISOL-AM, BLOOD: Cortisol - AM: 11.7 ug/dL (ref 6.7–22.6)

## 2023-10-10 LAB — BASIC METABOLIC PANEL
Anion gap: 9 (ref 5–15)
BUN: 28 mg/dL — ABNORMAL HIGH (ref 8–23)
CO2: 23 mmol/L (ref 22–32)
Calcium: 8.5 mg/dL — ABNORMAL LOW (ref 8.9–10.3)
Chloride: 100 mmol/L (ref 98–111)
Creatinine, Ser: 1.13 mg/dL (ref 0.61–1.24)
GFR, Estimated: >60 mL/min (ref >60)
Glucose, Bld: 168 mg/dL — ABNORMAL HIGH (ref 70–99)
Potassium: 4.4 mmol/L (ref 3.5–5.1)
Sodium: 132 mmol/L — ABNORMAL LOW (ref 135–145)

## 2023-10-10 LAB — CREATININE, URINE, RANDOM: Creatinine, Urine: 46 mg/dL

## 2023-10-10 LAB — SODIUM, URINE, RANDOM: Sodium, Ur: 61 mmol/L

## 2023-10-10 LAB — OSMOLALITY: Osmolality: 301 mosm/kg — ABNORMAL HIGH (ref 275–295)

## 2023-10-10 LAB — PROTIME-INR
INR: 1.2 (ref 0.8–1.2)
Prothrombin Time: 15.1 s (ref 11.4–15.2)

## 2023-10-10 LAB — CK: Total CK: 259 U/L (ref 49–397)

## 2023-10-10 LAB — TROPONIN I (HIGH SENSITIVITY): Troponin I (High Sensitivity): 12 ng/L (ref <18)

## 2023-10-10 LAB — PHOSPHORUS: Phosphorus: 3 mg/dL (ref 2.5–4.6)

## 2023-10-10 LAB — STREP PNEUMONIAE URINARY ANTIGEN: Strep Pneumo Urinary Antigen: NEGATIVE

## 2023-10-10 LAB — HIV ANTIBODY (ROUTINE TESTING W REFLEX): HIV Screen 4th Generation wRfx: NONREACTIVE

## 2023-10-10 LAB — CREATININE, SERUM
Creatinine, Ser: 1.38 mg/dL — ABNORMAL HIGH (ref 0.61–1.24)
GFR, Estimated: 56 mL/min — ABNORMAL LOW (ref >60)

## 2023-10-10 LAB — MAGNESIUM: Magnesium: 2.6 mg/dL — ABNORMAL HIGH (ref 1.7–2.4)

## 2023-10-10 LAB — APTT: aPTT: 31 s (ref 24–36)

## 2023-10-10 LAB — TSH: TSH: 0.243 u[IU]/mL — ABNORMAL LOW (ref 0.350–4.500)

## 2023-10-10 LAB — T4, FREE: Free T4: 1.08 ng/dL (ref 0.61–1.12)

## 2023-10-10 MED ORDER — CHLORHEXIDINE GLUCONATE CLOTH 2 % EX PADS: 6.0000 | MEDICATED_PAD | Freq: Every day | CUTANEOUS | Status: DC

## 2023-10-10 MED ORDER — SODIUM CHLORIDE 0.9% FLUSH
10.0000 mL | Freq: Once | INTRAVENOUS | Status: AC
Start: 2023-10-10 — End: 2023-10-10
  Administered 2023-10-10: 10 mL

## 2023-10-10 MED ORDER — CEFDINIR 300 MG PO CAPS
300.0000 mg | ORAL_CAPSULE | Freq: Two times a day (BID) | ORAL | 0 refills | Status: AC
Start: 2023-10-10 — End: 2023-10-14
  Filled 2023-10-10: qty 8, 4d supply, fill #0

## 2023-10-10 MED ORDER — PREDNISONE 20 MG PO TABS
40.0000 mg | ORAL_TABLET | Freq: Every day | ORAL | 0 refills | Status: AC
  Filled 2023-10-10: qty 8, 4d supply, fill #0

## 2023-10-10 MED ORDER — IOHEXOL 350 MG/ML SOLN
100.0000 mL | Freq: Once | INTRAVENOUS | Status: AC | PRN
  Administered 2023-10-10: 75 mL via INTRAVENOUS

## 2023-10-10 MED ORDER — SODIUM CHLORIDE 0.9% FLUSH: 10.0000 mL | Freq: Two times a day (BID) | INTRAVENOUS | Status: DC

## 2023-10-10 MED ORDER — PROCHLORPERAZINE MALEATE 10 MG PO TABS
10.0000 mg | ORAL_TABLET | Freq: Four times a day (QID) | ORAL | 1 refills | Status: DC | PRN
  Filled 2023-10-10: qty 30, 8d supply, fill #0

## 2023-10-10 MED ORDER — LIDOCAINE-PRILOCAINE 2.5-2.5 % EX CREA
TOPICAL_CREAM | CUTANEOUS | 3 refills | Status: DC
  Filled 2023-10-10: qty 30, 30d supply, fill #0

## 2023-10-10 MED ORDER — ONDANSETRON HCL 8 MG PO TABS
8.0000 mg | ORAL_TABLET | Freq: Three times a day (TID) | ORAL | 1 refills | Status: DC | PRN
  Filled 2023-10-10: qty 30, 10d supply, fill #0

## 2023-10-10 MED ORDER — DEXAMETHASONE 4 MG PO TABS
8.0000 mg | ORAL_TABLET | Freq: Every day | ORAL | 1 refills | Status: DC
  Filled 2023-10-10: qty 30, 15d supply, fill #0

## 2023-10-10 MED ORDER — GUAIFENESIN-CODEINE 100-10 MG/5ML PO SOLN
5.0000 mL | Freq: Four times a day (QID) | ORAL | 0 refills | Status: DC | PRN
  Filled 2023-10-10: qty 473, 24d supply, fill #0

## 2023-10-10 MED ORDER — HEPARIN SOD (PORK) LOCK FLUSH 100 UNIT/ML IV SOLN: 500.0000 [IU] | INTRAVENOUS | Status: DC | PRN

## 2023-10-10 MED ORDER — AZITHROMYCIN 500 MG PO TABS
500.0000 mg | ORAL_TABLET | Freq: Every day | ORAL | 0 refills | Status: AC
Start: 2023-10-10 — End: 2023-10-14
  Filled 2023-10-10: qty 4, 4d supply, fill #0

## 2023-10-10 MED ORDER — HEPARIN SOD (PORK) LOCK FLUSH 100 UNIT/ML IV SOLN
500.0000 [IU] | Freq: Once | INTRAVENOUS | Status: AC
Start: 2023-10-10 — End: 2023-10-10
  Administered 2023-10-10: 500 [IU]

## 2023-10-10 MED ORDER — SODIUM CHLORIDE 0.9% FLUSH: 10.0000 mL | INTRAVENOUS | Status: DC | PRN

## 2023-10-10 NOTE — Care Management CC44 (Signed)
Condition Code 44 Documentation Completed  Patient Details  Name: Jordan [name] MRN: [id number] Date of Birth: Mar 13, 1956   Condition Code 44 given:  Yes Patient signature on Condition Code 44 notice:  Yes Documentation of 2 MD's agreement:  Yes Code 44 added to claim:  Yes    Larrie Kass, LCSW 10/10/2023, 1:36 PM

## 2023-10-10 NOTE — Progress Notes (Signed)
Ambulated patient in hallway on RA. Patient ranged 92-97% on RA. Walked approx 172ft. HR went as high as 102. Patient stated that he tolerated well. Breathing even, regular with walk. Resp became slightly labored when patient was walking and talking.

## 2023-10-10 NOTE — Discharge Summary (Signed)
Physician Discharge Summary   Patient: Jordan [name] MRN: [id number] DOB: [birth year]  Admit date:     10/09/2023  Discharge date: 10/10/2023  Discharge Physician: Jacquelin Hawking, MD   PCP: Storm Frisk, MD   Recommendations at discharge:  PCP visit for hospital follow-up  Discharge Diagnoses: Principal Problem:   Hypoxic respiratory failure [facility]) Active Problems:   Esophageal mass   Cocaine abuse (HCC)   Hyponatremia   Hypoxia   COPD exacerbation (HCC)  Resolved Problems:   * No resolved hospital problems. *  Hospital Course: Jordan [name] is a 67 y.o. male with a history of COPD and esophogeal cancer.  Patient presented secondary to shortness of breath concerning for a COPD exacerbation and pneumonia based on chest imaging. Patient started empirically on antibiotics and steroids with improvement of symptoms. Patient needed oxygen on admission which was weaned to room air prior to discharge.  Assessment and Plan:  Left lower lobe pneumonia Concern for possible aspiration versus atypical infection. Patient started on Ceftriaxone and azithromycin with improvement of symptoms. Discharge on Cefdinir and azithromycin.  Acute respiratory failure with hypoxia Secondary to pneumonia. Resolved prior to discharge.  COPD exacerbation Patient managed with antibiotics and steroids with improvement of symptoms. Discharged on prednisone.  Hyponatremia Mild on admission. Resolved.  Cocaine abuse Noted. Patient counseled on cessation.  Esophageal cancer Noted. Follow-up with oncology.   Consultants: None Procedures performed: None  Disposition: Home Diet recommendation: Regular diet   DISCHARGE MEDICATION: Allergies as of 10/10/2023   No Known Allergies      Medication List     STOP taking these medications    guaiFENesin-codeine 100-10 MG/5ML syrup   naproxen 500 MG tablet Commonly known as: NAPROSYN       TAKE these medications    Advair HFA  115-21 MCG/ACT inhaler Generic drug: fluticasone-salmeterol Inhale 2 puffs into the lungs 2 (two) times daily.   amLODipine 10 MG tablet Commonly known as: NORVASC Take 1 tablet (10 mg total) by mouth daily.   azithromycin 500 MG tablet Commonly known as: Zithromax Take 1 tablet (500 mg total) by mouth daily for 4 days.   cefdinir 300 MG capsule Commonly known as: OMNICEF Take 1 capsule (300 mg total) by mouth 2 (two) times daily for 4 days.   Centrum Silver 50+Men Tabs Take 1 tablet by mouth daily with breakfast.   Delsym 30 MG/5ML liquid Generic drug: dextromethorphan Take 30 mg by mouth 2 (two) times daily as needed for cough.   lidocaine-prilocaine cream Commonly known as: EMLA Apply 1 Application topically as needed. What changed: reasons to take this   omeprazole 40 MG capsule Commonly known as: PRILOSEC Take 1 capsule (40 mg total) by mouth daily before dinner.   ondansetron 8 MG tablet Commonly known as: Zofran Take 1 tablet (8 mg total) by mouth every 8 (eight) hours as needed for nausea or vomiting. Start on the third day after chemotherapy.   oxyCODONE-acetaminophen 5-325 MG tablet Commonly known as: PERCOCET/ROXICET Take 1 tablet by mouth every 6 (six) hours as needed for severe pain (pain score 7-10).   predniSONE 20 MG tablet Commonly known as: DELTASONE Take 2 tablets (40 mg total) by mouth daily with breakfast for 4 days.   prochlorperazine 10 MG tablet Commonly known as: COMPAZINE Take 1 tablet (10 mg total) by mouth every 6 (six) hours as needed for nausea or vomiting.   promethazine-dextromethorphan 6.25-15 MG/5ML syrup Commonly known as: PROMETHAZINE-DM Take 5 mLs by mouth 4 (  four) times daily as needed for cough.   sildenafil 100 MG tablet Commonly known as: VIAGRA Take 1 tablet (100 mg total) by mouth daily as needed for erectile dysfunction.   sucralfate 1 g tablet Commonly known as: Carafate Take 1 tablet (1 g total) by mouth 4 (four)  times daily -  with meals and at bedtime.   Ventolin HFA 108 (90 Base) MCG/ACT inhaler Generic drug: albuterol Inhale 2 puffs into the lungs every 6 (six) hours as needed for wheezing or shortness of breath.        Discharge Exam: BP (!) 144/78 (BP Location: Left Arm)   Pulse 93   Temp 98 F (36.7 C) (Oral)   Resp (!) 21   Ht 5\' 11"  (1.803 m)   Wt 78 kg   SpO2 90%   BMI 23.98 kg/m   General exam: Appears calm and comfortable Respiratory system:  Respiratory effort normal. Cardiovascular system: S1 & S2 heard, RRR. No murmurs, rubs, gallops or clicks. Gastrointestinal system: Abdomen is nondistended, soft and nontender. Normal bowel sounds heard. Central nervous system: Alert and oriented. No focal neurological deficits. Psychiatry: Judgement and insight appear normal. Mood & affect appropriate.   Condition at discharge: stable  The results of significant diagnostics from this hospitalization (including imaging, microbiology, ancillary and laboratory) are listed below for reference.   Imaging Studies: CT Angio Chest Pulmonary Embolism (PE) W or WO Contrast  Result Date: 10/10/2023 CLINICAL DATA:  High probability pulmonary embolism, dyspnea, wheezing, tachycardia. Esophageal cancer EXAM: CT ANGIOGRAPHY CHEST WITH CONTRAST TECHNIQUE: Multidetector CT imaging of the chest was performed using the standard protocol during bolus administration of intravenous contrast. Multiplanar CT image reconstructions and MIPs were obtained to evaluate the vascular anatomy. RADIATION DOSE REDUCTION: This exam was performed according to the departmental dose-optimization program which includes automated exposure control, adjustment of the mA and/or kV according to patient size and/or use of iterative reconstruction technique. CONTRAST:  75mL OMNIPAQUE IOHEXOL 350 MG/ML SOLN COMPARISON:  10/09/2023 FINDINGS: Cardiovascular: There is adequate opacification of the pulmonary arterial tree. No  intraluminal filling defect identified to suggest acute pulmonary embolism. The central pulmonary arteries are enlarged in keeping with changes of pulmonary arterial hypertension. Calcification of the aortic valve leaflets noted. Mild coronary artery calcification. Global cardiac size within normal limits. No pericardial effusion. Mild atherosclerotic calcification within the thoracic aorta. No aortic aneurysm. Right internal jugular chest port in place with its tip at the superior cavoatrial junction. Mediastinum/Nodes: Visualized thyroid is unremarkable. No pathologic thoracic adenopathy. Circumferential thickening of the distal esophagus is again identified likely reactive reflecting the patient's primary malignancy circumferential thickening of the distal esophagus proximal to this level may reflect changes esophagitis though malignant infiltration would be difficult to exclude. This is not well assessed on this examination. Lungs/Pleura: There is diffuse bronchial wall thickening in keeping with airway inflammation and extensive tree-in-bud nodularity again identified diffusely, stable since immediate prior examination, though new since prior examination of 07/12/2023. In the acute setting, this is most suggestive atypical infection or aspiration. No pneumothorax or pleural effusion. No central obstructing lesion. Upper Abdomen: No acute abnormality. Musculoskeletal: Pathologic fracture of the mid sternal body again identified. No additional lytic or blastic bone lesions. Multiple healed bilateral rib fractures are noted. Review of the MIP images confirms the above findings. IMPRESSION: 1. No pulmonary embolism. 2. Diffuse bronchial wall thickening in keeping with airway inflammation and extensive tree-in-bud nodularity again identified diffusely, stable since immediate prior examination, though new since prior  examination of 07/12/2023. In the acute setting, this is most suggestive of atypical infection or  aspiration. 3. Circumferential thickening of the distal esophagus likely reflecting the patient's primary malignancy. Circumferential thickening of the distal esophagus proximal to this level may reflect changes esophagitis though malignant infiltration would be difficult to exclude. This is not well assessed on this examination. 4. Pathologic fracture of the mid sternal body again identified. 5. Mild coronary artery calcification. Aortic Atherosclerosis (ICD10-I70.0). Electronically Signed   By: Helyn Numbers M.D.   On: 10/10/2023 01:29   DG CHEST PORT 1 VIEW  Result Date: 10/10/2023 CLINICAL DATA:  [id number] with chest pain, shortness of breath and cough, COPD and esophageal cancer. EXAM: PORTABLE CHEST 1 VIEW COMPARISON:  Chest CT yesterday at 10:00 p.m. FINDINGS: 12:16 a.m. Right IJ port catheter again terminates at about the superior cavoatrial junction. The lungs are mildly emphysematous. The CT 2 hours ago demonstrated patchy peripheral tree-in-bud opacities and scattered peripheral airspace disease anteriorly, but these findings are not well redemonstrated radiographically. No focal consolidation is seen. There is chest wall attenuation inferiorly. The mediastinum is normally outlined. There is aortic atherosclerosis. The cardiac size is normal. There are healed fractures of several bilateral ribs. Osteopenia with no new osseous findings. IMPRESSION: 1. Emphysema. 2. The CT 2 hours ago demonstrated patchy peripheral tree-in-bud opacities and scattered peripheral airspace disease anteriorly, but these findings are not well redemonstrated radiographically. 3. Aortic atherosclerosis. 4. Osteopenia. Electronically Signed   By: Almira Bar M.D.   On: 10/10/2023 00:46   CT CHEST WO CONTRAST  Result Date: 10/09/2023 CLINICAL DATA:  Respiratory illness, shortness of breath. Esophageal cancer. EXAM: CT CHEST WITHOUT CONTRAST TECHNIQUE: Multidetector CT imaging of the chest was performed following the  standard protocol without IV contrast. RADIATION DOSE REDUCTION: This exam was performed according to the departmental dose-optimization program which includes automated exposure control, adjustment of the mA and/or kV according to patient size and/or use of iterative reconstruction technique. COMPARISON:  PET-CT 07/24/2023 FINDINGS: Cardiovascular: Right chest port catheter tip ends in the distal SVC. The heart and aorta are normal in size. There are atherosclerotic calcifications of the aorta. There is no pericardial effusion. Mediastinum/Nodes: Distal esophageal mass appears grossly unchanged, but is not well delineated on this noncontrast study. The esophagus is nondilated. There are no enlarged mediastinal or lymph nodes. The visualized thyroid gland is within normal limits. Lungs/Pleura: There are new scattered tree-in-bud opacities throughout both lungs with some peripheral patchy airspace opacities in the right middle lobe and lingula. There is no pleural effusion or pneumothorax. There are additional new scattered nodular densities throughout the right lower lobe measuring up to 4 mm. Upper Abdomen: No acute abnormality. Musculoskeletal: There are multiple healed bilateral rib fractures. No acute fractures are seen. There has been progression of the erosive changes and sclerosis of the mid sternum with pathologic fracture. IMPRESSION: 1. New scattered tree-in-bud opacities throughout both lungs with some peripheral patchy airspace opacities in the right middle lobe and lingula. Findings are favored as infectious/inflammatory. 2. Additional new scattered nodular densities throughout the right lower lobe measuring up to 4 mm. These may be infectious/inflammatory, but metastatic disease is not excluded. 3. Distal esophageal mass appears grossly unchanged, but is not well delineated on this noncontrast study. 4. Progression of the erosive changes and sclerosis of the mid sternum with pathologic fracture. 5.  Aortic atherosclerosis. Aortic Atherosclerosis (ICD10-I70.0). Electronically Signed   By: Darliss Cheney M.D.   On: 10/09/2023 23:10   DG  Chest Port 1 View  Result Date: 10/09/2023 CLINICAL DATA:  Shortness of breath EXAM: PORTABLE CHEST 1 VIEW COMPARISON:  06/04/2020 FINDINGS: Right central line tip at the cavoatrial junction. No pneumothorax. Heart and mediastinal contours within normal limits. No confluent opacities or effusions. No acute bony abnormality. IMPRESSION: No acute cardiopulmonary disease. Electronically Signed   By: Charlett Nose M.D.   On: 10/09/2023 16:59   DG Hand 2 View Left  Result Date: 10/06/2023 CLINICAL DATA:  Fall.  Laceration to left hand. EXAM: LEFT HAND - 2 VIEW; LEFT FOREARM - 2 VIEW COMPARISON:  None Available. FINDINGS: There is acute, oblique probably incomplete fracture of the middle third shaft of the left ulna. No other acute fracture or dislocation. No aggressive osseous lesion. Mild degenerative changes of imaged joints. No radiopaque foreign bodies. Soft tissues are within normal limits. IMPRESSION: *Acute, oblique, incomplete fracture of the middle third shaft of the left ulna. Electronically Signed   By: Jules Schick M.D.   On: 10/06/2023 18:08   DG Forearm Left  Result Date: 10/06/2023 CLINICAL DATA:  Fall.  Laceration to left hand. EXAM: LEFT HAND - 2 VIEW; LEFT FOREARM - 2 VIEW COMPARISON:  None Available. FINDINGS: There is acute, oblique probably incomplete fracture of the middle third shaft of the left ulna. No other acute fracture or dislocation. No aggressive osseous lesion. Mild degenerative changes of imaged joints. No radiopaque foreign bodies. Soft tissues are within normal limits. IMPRESSION: *Acute, oblique, incomplete fracture of the middle third shaft of the left ulna. Electronically Signed   By: Jules Schick M.D.   On: 10/06/2023 18:08    Microbiology: Results for orders placed or performed during the hospital encounter of 10/09/23  Blood  culture (routine x 2)     Status: None (Preliminary result)   Collection Time: 10/09/23  5:11 PM   Specimen: BLOOD  Result Value Ref Range Status   Specimen Description   Final    BLOOD PORTA CATH Performed at [facility], [address] [place], [place] [address]    Special Requests   Final    BOTTLES DRAWN AEROBIC AND ANAEROBIC Blood Culture adequate volume Performed at [facility], [address] [place], [place] [address]    Culture   Final    NO GROWTH < 24 HOURS Performed at [facility], [address] [place], [place] [address]    Report Status PENDING  Incomplete  Resp panel by RT-PCR (RSV, Flu A&B, Covid) Anterior Nasal Swab     Status: None   Collection Time: 10/09/23  6:23 PM   Specimen: Anterior Nasal Swab  Result Value Ref Range Status   SARS Coronavirus 2 by RT PCR NEGATIVE NEGATIVE Final    Comment: (NOTE) SARS-CoV-2 target nucleic acids are NOT DETECTED.  The SARS-CoV-2 RNA is generally detectable in upper respiratory specimens during the acute phase of infection. The lowest concentration of SARS-CoV-2 viral copies this assay can detect is 138 copies/mL. A negative result does not preclude SARS-Cov-2 infection and should not be used as the sole basis for treatment or other patient management decisions. A negative result may occur with  improper specimen collection/handling, submission of specimen other than nasopharyngeal swab, presence of viral mutation(s) within the areas targeted by this assay, and inadequate number of viral copies(<138 copies/mL). A negative result must be combined with clinical observations, patient history, and epidemiological information. The expected result is Negative.  Fact Sheet for Patients:  BloggerCourse.com  Fact Sheet for Healthcare Providers:  SeriousBroker.it  This test is no t yet approved or cleared by the [place] FDA  and  has been authorized for detection and/or diagnosis of SARS-CoV-2 by FDA under an Emergency Use Authorization (EUA). This EUA will remain  in effect (meaning this test can be used) for the duration of the COVID-19 declaration under Section 564(b)(1) of the Act, 21 U.S.C.section 360bbb-3(b)(1), unless the authorization is terminated  or revoked sooner.       Influenza A by PCR NEGATIVE NEGATIVE Final   Influenza B by PCR NEGATIVE NEGATIVE Final    Comment: (NOTE) The Xpert Xpress SARS-CoV-2/FLU/RSV plus assay is intended as an aid in the diagnosis of influenza from Nasopharyngeal swab specimens and should not be used as a sole basis for treatment. Nasal washings and aspirates are unacceptable for Xpert Xpress SARS-CoV-2/FLU/RSV testing.  Fact Sheet for Patients: BloggerCourse.com  Fact Sheet for Healthcare Providers: SeriousBroker.it  This test is not yet approved or cleared by the [place] FDA and has been authorized for detection and/or diagnosis of SARS-CoV-2 by FDA under an Emergency Use Authorization (EUA). This EUA will remain in effect (meaning this test can be used) for the duration of the COVID-19 declaration under Section 564(b)(1) of the Act, 21 U.S.C. section 360bbb-3(b)(1), unless the authorization is terminated or revoked.     Resp Syncytial Virus by PCR NEGATIVE NEGATIVE Final    Comment: (NOTE) Fact Sheet for Patients: BloggerCourse.com  Fact Sheet for Healthcare Providers: SeriousBroker.it  This test is not yet approved or cleared by the [place] FDA and has been authorized for detection and/or diagnosis of SARS-CoV-2 by FDA under an Emergency Use Authorization (EUA). This EUA will remain in effect (meaning this test can be used) for the duration of the COVID-19 declaration under Section 564(b)(1) of the Act, 21 U.S.C. section 360bbb-3(b)(1),  unless the authorization is terminated or revoked.  Performed at [facility], [address], [place], [place] [address]     Labs: CBC: Recent Labs  Lab 10/09/23 1711 10/10/23 0015 10/10/23 0630  WBC 8.8 8.6 9.3  HGB 11.5* 11.9* 11.4*  HCT 33.9* 36.6* 34.0*  MCV 87.4 90.4 88.5  PLT 283 303 271   Basic Metabolic Panel: Recent Labs  Lab 10/09/23 1711 10/10/23 0015 10/10/23 0405  NA 130*  --  132*  K 4.1  --  4.4  CL 97*  --  100  CO2 24  --  23  GLUCOSE 174*  --  168*  BUN 30*  --  28*  CREATININE 1.32* 1.38* 1.13  CALCIUM 8.6*  --  8.5*  MG  --  2.6*  --   PHOS  --  3.0  --    Liver Function Tests: Recent Labs  Lab 10/09/23 1711  AST 41  ALT 51*  ALKPHOS 113  BILITOT 0.7  PROT 7.4  ALBUMIN 3.0*    Discharge time spent: 35 minutes.  Signed: Jacquelin Hawking, MD [facility] 10/10/2023

## 2023-10-10 NOTE — Discharge Instructions (Signed)
Jordan [name],  You were in the hospital with trouble breathing. This appears to have been related to a combination of COPD and pneumonia. Please continue steroids and antibiotics.

## 2023-10-10 NOTE — Progress Notes (Signed)
Discharge medications in secure bag delivered to bedside - placed in pt belonging bag.

## 2023-10-10 NOTE — ED Notes (Signed)
ED TO INPATIENT HANDOFF REPORT  ED Nurse Name and Phone #: [place], RN  S Name/Age/Gender Jordan [name] 67 y.o. male Room/Bed: WA18/WA18  Code Status   Code Status: Full Code  Home/SNF/Other Home Patient oriented to: self, place, time, and situation Is this baseline? Yes   Triage Complete: Triage complete  Chief Complaint Hypoxia [R09.02]  Triage Note Pt arrived via [facility] from home. Symptoms started at 0900. Hx of COPD and esophageal cancer. EMS reported Diminished breathing and wheezing. 83% reported by EMS on room air and 95% of Neb. Duo Neb given by EMS. 10 albuterol, 1 Atrovent, 125 Solu-Medrol, and 2 g Mag. Larey Seat off ladder Caleen Essex and reports he has felt bad since after the fall.    Allergies No Known Allergies  Level of Care/Admitting Diagnosis ED Disposition     ED Disposition  Admit   Condition  --   Comment  Hospital Area: [facility] [[id number]]  Level of Care: Progressive [102]  Admit to Progressive based on following criteria: RESPIRATORY PROBLEMS hypoxemic/hypercapnic respiratory failure that is responsive to NIPPV (BiPAP) or High Flow Nasal Cannula (6-80 lpm). Frequent assessment/intervention, no > Q2 hrs < Q4 hrs, to maintain oxygenation and pulmonary hygiene.  May admit patient to [facility] or [facility] if equivalent level of care is available:: No  Covid Evaluation: Confirmed COVID Negative  Diagnosis: Hypoxia [300808]  Admitting Physician: Nolberto Hanlon [[id number]]  Attending Physician: Nolberto Hanlon [[id number]]  Certification:: I certify this patient will need inpatient services for at least 2 midnights  Expected Medical Readiness: 10/11/2023          B Medical/Surgery History Past Medical History:  Diagnosis Date   Allergy    seasonal   Aneurysm of infrarenal abdominal aorta (HCC)    4.2 cm 07/12/23   Arthritis    Colon cancer (HCC)    Compression fracture of thoracic vertebra with routine healing 04/30/2020   COPD  (chronic obstructive pulmonary disease) (HCC)    Family history of adverse reaction to anesthesia    mother slow to awaken   GERD (gastroesophageal reflux disease)    Hepatitis    hepatitis c dx in remission per pt   Hypertension    Inguinal hernia    Multiple fractures 06/04/2020   Positive colorectal cancer screening using Cologuard test 08/27/2020   Substance abuse (HCC)    Wears glasses    for reading   Past Surgical History:  Procedure Laterality Date   COLONOSCOPY  11/11/2020   ENDOSCOPIC MUCOSAL RESECTION N/A 03/10/2021   Procedure: ENDOSCOPIC MUCOSAL RESECTION;  Surgeon: Lemar Lofty., MD;  Location: [facility];  Service: Gastroenterology;  Laterality: N/A;   ESOPHAGOGASTRODUODENOSCOPY (EGD) WITH PROPOFOL N/A 07/27/2023   Procedure: ESOPHAGOGASTRODUODENOSCOPY (EGD) WITH PROPOFOL;  Surgeon: Meridee Score Netty Starring., MD;  Location: [facility];  Service: Gastroenterology;  Laterality: N/A;   EUS N/A 07/27/2023   Procedure: UPPER ENDOSCOPIC ULTRASOUND (EUS) RADIAL;  Surgeon: Lemar Lofty., MD;  Location: [facility];  Service: Gastroenterology;  Laterality: N/A;   FLEXIBLE SIGMOIDOSCOPY N/A 03/10/2021   Procedure: FLEXIBLE SIGMOIDOSCOPY;  Surgeon: Meridee Score Netty Starring., MD;  Location: [facility];  Service: Gastroenterology;  Laterality: N/A;   HEMOSTASIS CLIP PLACEMENT  03/10/2021   Procedure: HEMOSTASIS CLIP PLACEMENT;  Surgeon: Lemar Lofty., MD;  Location: [facility];  Service: Gastroenterology;;   INGUINAL HERNIA REPAIR Right 01/19/2021   Procedure: LAPAROSCOPIC RIGHT INGUINAL HERNIA REPAIR WITH MESH;  Surgeon: Dossie Der Hyman Hopes, MD;  Location: [facility];  Service: General;  Laterality: Right;   IR IMAGING GUIDED PORT INSERTION  08/17/2023   obstruction removed as a child   swallowed a peanut   POLYPECTOMY  03/10/2021   Procedure: POLYPECTOMY;  Surgeon: Mansouraty, Netty Starring., MD;  Location: [facility];  Service:  Gastroenterology;;   POLYPECTOMY     SUBMUCOSAL LIFTING INJECTION  03/10/2021   Procedure: SUBMUCOSAL LIFTING INJECTION;  Surgeon: Lemar Lofty., MD;  Location: [facility];  Service: Gastroenterology;;     A IV Location/Drains/Wounds Patient Lines/Drains/Airways Status     Active Line/Drains/Airways     Name Placement date Placement time Site Days   Implanted Port 08/17/23 Right Chest 08/17/23  1136  Chest  54   Incision - 3 Ports Abdomen Umbilicus Right;Lateral Left;Lateral 01/19/21  0923  -- 994   Wound / Incision (Open or Dehisced) 08/04/23 Incision - Open Sternum Lower sternal biopsy 08/04/23  1202  Sternum  67            Intake/Output Last 24 hours  Intake/Output Summary (Last 24 hours) at 10/10/2023 0212 Last data filed at 10/09/2023 2002 Gross per 24 hour  Intake 500 ml  Output --  Net 500 ml    Labs/Imaging Results for orders placed or performed during the hospital encounter of 10/09/23 (from the past 48 hour(s))  Comprehensive metabolic panel     Status: Abnormal   Collection Time: 10/09/23  5:11 PM  Result Value Ref Range   Sodium 130 (L) 135 - 145 mmol/L   Potassium 4.1 3.5 - 5.1 mmol/L   Chloride 97 (L) 98 - 111 mmol/L   CO2 24 22 - 32 mmol/L   Glucose, Bld 174 (H) 70 - 99 mg/dL    Comment: Glucose reference range applies only to samples taken after fasting for at least 8 hours.   BUN 30 (H) 8 - 23 mg/dL   Creatinine, Ser 2.53 (H) 0.61 - 1.24 mg/dL   Calcium 8.6 (L) 8.9 - 10.3 mg/dL   Total Protein 7.4 6.5 - 8.1 g/dL   Albumin 3.0 (L) 3.5 - 5.0 g/dL   AST 41 15 - 41 U/L   ALT 51 (H) 0 - 44 U/L   Alkaline Phosphatase 113 38 - 126 U/L   Total Bilirubin 0.7 <1.2 mg/dL   GFR, Estimated 59 (L) >60 mL/min    Comment: (NOTE) Calculated using the CKD-EPI Creatinine Equation (2021)    Anion gap 9 5 - 15    Comment: Performed at [facility], [address] [place], [place] [address]  CBC     Status: Abnormal   Collection  Time: 10/09/23  5:11 PM  Result Value Ref Range   WBC 8.8 4.0 - 10.5 K/uL   RBC 3.88 (L) 4.22 - 5.81 MIL/uL   Hemoglobin 11.5 (L) 13.0 - 17.0 g/dL   HCT 34.7 (L) 42.5 - 95.6 %   MCV 87.4 80.0 - 100.0 fL   MCH 29.6 26.0 - 34.0 pg   MCHC 33.9 30.0 - 36.0 g/dL   RDW 38.7 (H) 56.4 - 33.2 %   Platelets 283 150 - 400 K/uL   nRBC 0.0 0.0 - 0.2 %    Comment: Performed at [facility], [address] [place], [place] [address]  Brain natriuretic peptide     Status: Abnormal   Collection Time: 10/09/23  5:11 PM  Result Value Ref Range   B Natriuretic Peptide 214.0 (H) 0.0 - 100.0 pg/mL    Comment:  Performed at [facility], [address] [place], [place] [address]  Troponin I (High Sensitivity)     Status: None   Collection Time: 10/09/23  5:11 PM  Result Value Ref Range   Troponin I (High Sensitivity) 16 <18 ng/L    Comment: (NOTE) Elevated high sensitivity troponin I (hsTnI) values and significant  changes across serial measurements may suggest ACS but many other  chronic and acute conditions are known to elevate hsTnI results.  Refer to the "Links" section for chest pain algorithms and additional  guidance. Performed at [facility], [address] [place], [place] [address]   I-Stat CG4 Lactic Acid     Status: None   Collection Time: 10/09/23  5:20 PM  Result Value Ref Range   Lactic Acid, Venous 1.4 0.5 - 1.9 mmol/L  Resp panel by RT-PCR (RSV, Flu A&B, Covid) Anterior Nasal Swab     Status: None   Collection Time: 10/09/23  6:23 PM   Specimen: Anterior Nasal Swab  Result Value Ref Range   SARS Coronavirus 2 by RT PCR NEGATIVE NEGATIVE    Comment: (NOTE) SARS-CoV-2 target nucleic acids are NOT DETECTED.  The SARS-CoV-2 RNA is generally detectable in upper respiratory specimens during the acute phase of infection. The lowest concentration of SARS-CoV-2 viral copies this assay can detect is 138 copies/mL. A negative  result does not preclude SARS-Cov-2 infection and should not be used as the sole basis for treatment or other patient management decisions. A negative result may occur with  improper specimen collection/handling, submission of specimen other than nasopharyngeal swab, presence of viral mutation(s) within the areas targeted by this assay, and inadequate number of viral copies(<138 copies/mL). A negative result must be combined with clinical observations, patient history, and epidemiological information. The expected result is Negative.  Fact Sheet for Patients:  BloggerCourse.com  Fact Sheet for Healthcare Providers:  SeriousBroker.it  This test is no t yet approved or cleared by the [place] FDA and  has been authorized for detection and/or diagnosis of SARS-CoV-2 by FDA under an Emergency Use Authorization (EUA). This EUA will remain  in effect (meaning this test can be used) for the duration of the COVID-19 declaration under Section 564(b)(1) of the Act, 21 U.S.C.section 360bbb-3(b)(1), unless the authorization is terminated  or revoked sooner.       Influenza A by PCR NEGATIVE NEGATIVE   Influenza B by PCR NEGATIVE NEGATIVE    Comment: (NOTE) The Xpert Xpress SARS-CoV-2/FLU/RSV plus assay is intended as an aid in the diagnosis of influenza from Nasopharyngeal swab specimens and should not be used as a sole basis for treatment. Nasal washings and aspirates are unacceptable for Xpert Xpress SARS-CoV-2/FLU/RSV testing.  Fact Sheet for Patients: BloggerCourse.com  Fact Sheet for Healthcare Providers: SeriousBroker.it  This test is not yet approved or cleared by the [place] FDA and has been authorized for detection and/or diagnosis of SARS-CoV-2 by FDA under an Emergency Use Authorization (EUA). This EUA will remain in effect (meaning this test can be used) for the  duration of the COVID-19 declaration under Section 564(b)(1) of the Act, 21 U.S.C. section 360bbb-3(b)(1), unless the authorization is terminated or revoked.     Resp Syncytial Virus by PCR NEGATIVE NEGATIVE    Comment: (NOTE) Fact Sheet for Patients: BloggerCourse.com  Fact Sheet for Healthcare Providers: SeriousBroker.it  This test is not yet approved or cleared by the [place] FDA and has been authorized for detection and/or diagnosis of  SARS-CoV-2 by FDA under an Emergency Use Authorization (EUA). This EUA will remain in effect (meaning this test can be used) for the duration of the COVID-19 declaration under Section 564(b)(1) of the Act, 21 U.S.C. section 360bbb-3(b)(1), unless the authorization is terminated or revoked.  Performed at [facility], [address], [place], [place] [address]   Troponin I (High Sensitivity)     Status: None   Collection Time: 10/09/23  6:23 PM  Result Value Ref Range   Troponin I (High Sensitivity) 17 <18 ng/L    Comment: (NOTE) Elevated high sensitivity troponin I (hsTnI) values and significant  changes across serial measurements may suggest ACS but many other  chronic and acute conditions are known to elevate hsTnI results.  Refer to the "Links" section for chest pain algorithms and additional  guidance. Performed at [facility], [address], [place], [place] [address]   I-Stat CG4 Lactic Acid     Status: None   Collection Time: 10/09/23  6:34 PM  Result Value Ref Range   Lactic Acid, Venous 1.6 0.5 - 1.9 mmol/L  Blood gas, arterial     Status: Abnormal   Collection Time: 10/09/23 11:18 PM  Result Value Ref Range   O2 Content 5.0 L/min   Delivery systems NASAL CANNULA    pH, Arterial 7.44 7.35 - 7.45   pCO2 arterial 42 32 - 48 mmHg   pO2, Arterial 114 (H) 83 - 108 mmHg   Bicarbonate 28.5 (H) 20.0 - 28.0 mmol/L   Acid-Base Excess 3.9  (H) 0.0 - 2.0 mmol/L   O2 Saturation 98.4 %   Patient temperature 37.1    Collection site RIGHT RADIAL    Drawn by [id number]    Allens test (pass/fail) PASS PASS    Comment: Performed at [facility], [address] [place], [place] [address]  CBC     Status: Abnormal   Collection Time: 10/10/23 12:15 AM  Result Value Ref Range   WBC 8.6 4.0 - 10.5 K/uL   RBC 4.05 (L) 4.22 - 5.81 MIL/uL   Hemoglobin 11.9 (L) 13.0 - 17.0 g/dL   HCT 30.8 (L) 65.7 - 84.6 %   MCV 90.4 80.0 - 100.0 fL   MCH 29.4 26.0 - 34.0 pg   MCHC 32.5 30.0 - 36.0 g/dL   RDW 96.2 (H) 95.2 - 84.1 %   Platelets 303 150 - 400 K/uL   nRBC 0.0 0.0 - 0.2 %    Comment: Performed at [facility], [address] [place], [place] [address]  Creatinine, serum     Status: Abnormal   Collection Time: 10/10/23 12:15 AM  Result Value Ref Range   Creatinine, Ser 1.38 (H) 0.61 - 1.24 mg/dL   GFR, Estimated 56 (L) >60 mL/min    Comment: (NOTE) Calculated using the CKD-EPI Creatinine Equation (2021) Performed at [facility], [address] [place], [place] [address]   CK     Status: None   Collection Time: 10/10/23 12:15 AM  Result Value Ref Range   Total CK 259 49 - 397 U/L    Comment: Performed at [facility], [address] [place], [place] [address]  Magnesium     Status: Abnormal   Collection Time: 10/10/23 12:15 AM  Result Value Ref Range   Magnesium 2.6 (H) 1.7 - 2.4 mg/dL    Comment: Performed at [facility], [address], [place], [place] [address]  Phosphorus     Status: None   Collection Time: 10/10/23 12:15 AM  Result Value Ref Range   Phosphorus 3.0 2.5 - 4.6 mg/dL    Comment: Performed at [facility], [address], [place], [place] [address]   CT Angio Chest Pulmonary Embolism (PE) W or WO Contrast  Result Date: 10/10/2023 CLINICAL DATA:  High probability pulmonary embolism, dyspnea,  wheezing, tachycardia. Esophageal cancer EXAM: CT ANGIOGRAPHY CHEST WITH CONTRAST TECHNIQUE: Multidetector CT imaging of the chest was performed using the standard protocol during bolus administration of intravenous contrast. Multiplanar CT image reconstructions and MIPs were obtained to evaluate the vascular anatomy. RADIATION DOSE REDUCTION: This exam was performed according to the departmental dose-optimization program which includes automated exposure control, adjustment of the mA and/or kV according to patient size and/or use of iterative reconstruction technique. CONTRAST:  75mL OMNIPAQUE IOHEXOL 350 MG/ML SOLN COMPARISON:  10/09/2023 FINDINGS: Cardiovascular: There is adequate opacification of the pulmonary arterial tree. No intraluminal filling defect identified to suggest acute pulmonary embolism. The central pulmonary arteries are enlarged in keeping with changes of pulmonary arterial hypertension. Calcification of the aortic valve leaflets noted. Mild coronary artery calcification. Global cardiac size within normal limits. No pericardial effusion. Mild atherosclerotic calcification within the thoracic aorta. No aortic aneurysm. Right internal jugular chest port in place with its tip at the superior cavoatrial junction. Mediastinum/Nodes: Visualized thyroid is unremarkable. No pathologic thoracic adenopathy. Circumferential thickening of the distal esophagus is again identified likely reactive reflecting the patient's primary malignancy circumferential thickening of the distal esophagus proximal to this level may reflect changes esophagitis though malignant infiltration would be difficult to exclude. This is not well assessed on this examination. Lungs/Pleura: There is diffuse bronchial wall thickening in keeping with airway inflammation and extensive tree-in-bud nodularity again identified diffusely, stable since immediate prior examination, though new since prior examination of 07/12/2023. In the acute  setting, this is most suggestive atypical infection or aspiration. No pneumothorax or pleural effusion. No central obstructing lesion. Upper Abdomen: No acute abnormality. Musculoskeletal: Pathologic fracture of the mid sternal body again identified. No additional lytic or blastic bone lesions. Multiple healed bilateral rib fractures are noted. Review of the MIP images confirms the above findings. IMPRESSION: 1. No pulmonary embolism. 2. Diffuse bronchial wall thickening in keeping with airway inflammation and extensive tree-in-bud nodularity again identified diffusely, stable since immediate prior examination, though new since prior examination of 07/12/2023. In the acute setting, this is most suggestive of atypical infection or aspiration. 3. Circumferential thickening of the distal esophagus likely reflecting the patient's primary malignancy. Circumferential thickening of the distal esophagus proximal to this level may reflect changes esophagitis though malignant infiltration would be difficult to exclude. This is not well assessed on this examination. 4. Pathologic fracture of the mid sternal body again identified. 5. Mild coronary artery calcification. Aortic Atherosclerosis (ICD10-I70.0). Electronically Signed   By: Helyn Numbers M.D.   On: 10/10/2023 01:29   DG CHEST PORT 1 VIEW  Result Date: 10/10/2023 CLINICAL DATA:  [id number] with chest pain, shortness of breath and cough, COPD and esophageal cancer. EXAM: PORTABLE CHEST 1 VIEW COMPARISON:  Chest CT yesterday at 10:00 p.m. FINDINGS: 12:16 a.m. Right IJ port catheter again terminates at about the superior cavoatrial junction. The lungs are mildly emphysematous. The CT 2 hours ago demonstrated patchy peripheral tree-in-bud opacities and scattered peripheral airspace disease anteriorly, but these findings are not well redemonstrated radiographically. No focal consolidation is seen. There is chest wall attenuation inferiorly. The  mediastinum is normally  outlined. There is aortic atherosclerosis. The cardiac size is normal. There are healed fractures of several bilateral ribs. Osteopenia with no new osseous findings. IMPRESSION: 1. Emphysema. 2. The CT 2 hours ago demonstrated patchy peripheral tree-in-bud opacities and scattered peripheral airspace disease anteriorly, but these findings are not well redemonstrated radiographically. 3. Aortic atherosclerosis. 4. Osteopenia. Electronically Signed   By: Almira Bar M.D.   On: 10/10/2023 00:46   CT CHEST WO CONTRAST  Result Date: 10/09/2023 CLINICAL DATA:  Respiratory illness, shortness of breath. Esophageal cancer. EXAM: CT CHEST WITHOUT CONTRAST TECHNIQUE: Multidetector CT imaging of the chest was performed following the standard protocol without IV contrast. RADIATION DOSE REDUCTION: This exam was performed according to the departmental dose-optimization program which includes automated exposure control, adjustment of the mA and/or kV according to patient size and/or use of iterative reconstruction technique. COMPARISON:  PET-CT 07/24/2023 FINDINGS: Cardiovascular: Right chest port catheter tip ends in the distal SVC. The heart and aorta are normal in size. There are atherosclerotic calcifications of the aorta. There is no pericardial effusion. Mediastinum/Nodes: Distal esophageal mass appears grossly unchanged, but is not well delineated on this noncontrast study. The esophagus is nondilated. There are no enlarged mediastinal or lymph nodes. The visualized thyroid gland is within normal limits. Lungs/Pleura: There are new scattered tree-in-bud opacities throughout both lungs with some peripheral patchy airspace opacities in the right middle lobe and lingula. There is no pleural effusion or pneumothorax. There are additional new scattered nodular densities throughout the right lower lobe measuring up to 4 mm. Upper Abdomen: No acute abnormality. Musculoskeletal: There are multiple healed bilateral rib  fractures. No acute fractures are seen. There has been progression of the erosive changes and sclerosis of the mid sternum with pathologic fracture. IMPRESSION: 1. New scattered tree-in-bud opacities throughout both lungs with some peripheral patchy airspace opacities in the right middle lobe and lingula. Findings are favored as infectious/inflammatory. 2. Additional new scattered nodular densities throughout the right lower lobe measuring up to 4 mm. These may be infectious/inflammatory, but metastatic disease is not excluded. 3. Distal esophageal mass appears grossly unchanged, but is not well delineated on this noncontrast study. 4. Progression of the erosive changes and sclerosis of the mid sternum with pathologic fracture. 5. Aortic atherosclerosis. Aortic Atherosclerosis (ICD10-I70.0). Electronically Signed   By: Darliss Cheney M.D.   On: 10/09/2023 23:10   DG Chest Port 1 View  Result Date: 10/09/2023 CLINICAL DATA:  Shortness of breath EXAM: PORTABLE CHEST 1 VIEW COMPARISON:  06/04/2020 FINDINGS: Right central line tip at the cavoatrial junction. No pneumothorax. Heart and mediastinal contours within normal limits. No confluent opacities or effusions. No acute bony abnormality. IMPRESSION: No acute cardiopulmonary disease. Electronically Signed   By: Charlett Nose M.D.   On: 10/09/2023 16:59    Pending Labs Unresulted Labs (From admission, onward)     Start     Ordered   10/16/23 0500  Creatinine, serum  (enoxaparin (LOVENOX)    CrCl >/= 30 ml/min)  Weekly,   R     Comments: while on enoxaparin therapy    10/09/23 2200   10/10/23 0500  Cortisol-am, blood  Tomorrow morning,   R        10/09/23 2150   10/10/23 0500  TSH  Tomorrow morning,   R        10/09/23 2150   10/10/23 0500  APTT  Tomorrow morning,   R        10/09/23 2200  10/10/23 0500  Protime-INR  Tomorrow morning,   R        10/09/23 2200   10/10/23 0500  Basic metabolic panel  Tomorrow morning,   R        10/09/23 2200    10/10/23 0500  CBC  Tomorrow morning,   R        10/09/23 2200   10/10/23 0100  Osmolality  Once,   AD        10/10/23 0100   10/10/23 0017  Expectorated Sputum Assessment w Gram Stain, Rflx to Resp Cult  Once,   R       Question:  Patient immune status  Answer:  Normal   10/10/23 0017   10/10/23 0017  Legionella Pneumophila Serogp 1 Ur Ag  Once,   R        10/10/23 0017   10/10/23 0017  Strep pneumoniae urinary antigen  Once,   R        10/10/23 0017   10/10/23 0011  Rapid urine drug screen (hospital performed)  ONCE - STAT,   STAT        10/10/23 0010   10/09/23 2159  HIV Antibody (routine testing w rflx)  (HIV Antibody (Routine testing w reflex) panel)  Once,   R        10/09/23 2200   10/09/23 2152  Creatinine, urine, random  Once,   URGENT        10/09/23 2151   10/09/23 2151  Urinalysis, Complete w Microscopic -Urine, Clean Catch  Once,   URGENT       Question:  Specimen Source  Answer:  Urine, Clean Catch   10/09/23 2151   10/09/23 2150  Sodium, urine, random  Once,   URGENT        10/09/23 2150   10/09/23 1618  Blood culture (routine x 2)  BLOOD CULTURE X 2,   R      10/09/23 1618            Vitals/Pain Today's Vitals   10/09/23 2245 10/09/23 2319 10/10/23 0115 10/10/23 0129  BP: 128/83  (!) 157/93   Pulse: 80  88   Resp: (!) 23  (!) 22   Temp:    98.2 F (36.8 C)  TempSrc:    Oral  SpO2: 94% 97% 98%   Weight:      Height:      PainSc:        Isolation Precautions No active isolations  Medications Medications  labetalol (NORMODYNE) injection 20 mg (20 mg Intravenous Given 10/09/23 2224)  hydrALAZINE (APRESOLINE) injection 10 mg (has no administration in time range)  ipratropium-albuterol (DUONEB) 0.5-2.5 (3) MG/3ML nebulizer solution 3 mL (3 mLs Nebulization Given 10/09/23 2319)  cefTRIAXone (ROCEPHIN) 1 g in sodium chloride 0.9 % 100 mL IVPB (0 g Intravenous Stopped 10/09/23 2313)  azithromycin (ZITHROMAX) 500 mg in sodium chloride 0.9 % 250 mL IVPB (0  mg Intravenous Stopped 10/10/23 0031)  mometasone-formoterol (DULERA) 200-5 MCG/ACT inhaler 2 puff (2 puffs Inhalation Not Given 10/09/23 2313)  pantoprazole (PROTONIX) EC tablet 40 mg (has no administration in time range)  oxyCODONE-acetaminophen (PERCOCET/ROXICET) 5-325 MG per tablet 1 tablet (has no administration in time range)  enoxaparin (LOVENOX) injection 40 mg (has no administration in time range)  acetaminophen (TYLENOL) tablet 650 mg (has no administration in time range)    Or  acetaminophen (TYLENOL) suppository 650 mg (has no administration in time range)  polyethylene glycol (MIRALAX / GLYCOLAX)  packet 17 g (has no administration in time range)  sodium chloride flush (NS) 0.9 % injection 3 mL (has no administration in time range)  methylPREDNISolone sodium succinate (SOLU-MEDROL) 40 mg/mL injection 40 mg (40 mg Intravenous Given 10/09/23 2329)  lactated ringers bolus 500 mL (0 mLs Intravenous Stopped 10/09/23 2002)  albuterol (PROVENTIL) (2.5 MG/3ML) 0.083% nebulizer solution 5 mg (5 mg Nebulization Given 10/09/23 2209)  iohexol (OMNIPAQUE) 350 MG/ML injection 100 mL (75 mLs Intravenous Contrast Given 10/10/23 0049)    Mobility walks     Focused Assessments Pulmonary Assessment Handoff:  Lung sounds: Bilateral Breath Sounds: Diminished, Rhonchi O2 Device: Nasal Cannula O2 Flow Rate (L/min): 5 L/min    R Recommendations: See Admitting Provider Note  Report given to:   Additional Notes: Pt has improved from documented episodes of increased work of breathing and decreased LOC. Now easily aroused from sleep, less tachypneic, still increased work of breathing but less severe.

## 2023-10-10 NOTE — Progress Notes (Signed)
[facility]   Telephone:([phone]) [phone] [phone]   Clinic Follow up Note   Patient Care Team: Storm Frisk, MD as PCP - General (Pulmonary Disease) Donzetta Starch, MD as Consulting Physician (Dermatology) Veryl Speak, FNP as Nurse Practitioner (Infectious Diseases) Forestine Na (Physician Assistant) Bradly Bienenstock, MD as Consulting Physician (Orthopedic Surgery) Malachy Mood, MD as Consulting Physician (Hematology and Oncology)  Date of Service:  10/10/2023  CHIEF COMPLAINT: f/u of esophageal cancer  CURRENT THERAPY:  Observation  Oncology History   Esophageal cancer [facility]) Distal esophageal carcinoma, 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  We have reviewed his case in the tumor board, and recommend biopsy of the sternal bone lesion.   -He underwent a EUS staging, which showed 2 tumors in the distal esophagus, staged as T1smN0 and T2N0.  -His sternal bone biopsy was negative for malignant cells. -He started concurrent chemotherapy and radiation on 08/15/2023 with weekly carbo and Taxol.  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   Assessment and Plan    Esophageal Cancer Post-radiation esophageal thickening noted on recent CT scan. Completed chemoradiation in mid-November. No immediate plans for surgery. Discussed expected post-radiation esophageal thickening and plan for additional chemotherapy (FOLFOX) starting the week after Christmas. Explained FOLFOX regimen: two drugs,  one via infusion over two hours and the other via a pump for two days, every two weeks for six treatments, followed by a break and endoscopy to assess esophageal status. - Schedule chemotherapy (FOLFOX) to start the week after Christmas - Plan for endoscopy after chemotherapy to assess esophageal status  Chronic Obstructive Pulmonary Disease (COPD) Recent exacerbation requiring hospitalization with severe dyspnea and hypoxemia (O2 saturation 35%). Received breathing treatments, CT scan, and x-rays; diagnosed with bronchitis, no blood clots. Inconsistent use of home oxygen. Discussed importance of pulmonologist follow-up. Agreed to referral to [facility]. - Refer to pulmonologist at [facility] - Prescribe cough medicine with codeine - Encourage consistent use of home oxygen - Advise to call [facility] if not contacted by next week  Sternal Fracture Likely due to recent fall while cleaning gutters. Reports chest tightness and pain exacerbated by coughing. No surgical intervention required. Expected to heal with rest. - Advise rest and avoidance of strenuous activities - Prescribe cough medicine with codeine to manage pain and reduce coughing  Arm Fracture Recent arm fracture from fall, treated by orthopedic surgeon with no need for surgery. Received 13 stitches in the hand. - Advise continued use of arm brace - Monitor healing process  General Health Maintenance Advised to maintain nutritional intake to support healing and recovery. Ensure and Boost recommended to help gain weight. - Encourage intake of Ensure or Boost to aid in weight gain and recovery  Plan -I reviewed his recent hospital course, lab and scan -He has follow-up appointment with primary care physician on December 30 -Pulmonary referral for his COPD - Schedule next oncology appointment for chemotherapy initiation the week of 12/30         SUMMARY OF ONCOLOGIC HISTORY: Oncology History   Adenocarcinoma of colon (  HCC)  01/19/2021 Initial Diagnosis   Adenocarcinoma of colon (HCC)   08/29/2023 Genetic Testing   Negative [facility] Multi-Cancer +RNA Panel.  Report date is 08/29/2023.   The Multi-Cancer + RNA Panel offered by [facility] includes sequencing and/or deletion/duplication analysis of the following 70 genes:  AIP*, ALK, APC*, ATM*, AXIN2*, BAP1*, BARD1*, BLM*, BMPR1A*, BRCA1*, BRCA2*, BRIP1*, CDC73*, CDH1*, CDK4, CDKN1B*, CDKN2A, CHEK2*, CTNNA1*, DICER1*, EPCAM (del/dup only), EGFR, FH*, FLCN*, GREM1 (promoter dup only), HOXB13, KIT, LZTR1, MAX*, MBD4, MEN1*, MET, MITF, MLH1*, MSH2*, MSH3*, MSH6*, MUTYH*, NF1*, NF2*, NTHL1*, PALB2*, PDGFRA, PMS2*, POLD1*, POLE*, POT1*, PRKAR1A*, PTCH1*, PTEN*, RAD51C*, RAD51D*, RB1*, RET, SDHA* (sequencing only), SDHAF2*, SDHB*, SDHC*, SDHD*, SMAD4*, SMARCA4*, SMARCB1*, SMARCE1*, STK11*, SUFU*, TMEM127*, TP53*, TSC1*, TSC2*, VHL*. RNA analysis is performed for * genes.   Esophageal cancer (HCC)  07/13/2023 Initial Diagnosis   Esophageal cancer (HCC)   08/15/2023 - 09/18/2023 Chemotherapy   Patient is on Treatment Plan : ESOPHAGUS Carboplatin + Paclitaxel Weekly X 6 Weeks with XRT     08/29/2023 Genetic Testing   Negative [facility] Multi-Cancer +RNA Panel.  Report date is 08/29/2023.   The Multi-Cancer + RNA Panel offered by [facility] includes sequencing and/or deletion/duplication analysis of the following 70 genes:  AIP*, ALK, APC*, ATM*, AXIN2*, BAP1*, BARD1*, BLM*, BMPR1A*, BRCA1*, BRCA2*, BRIP1*, CDC73*, CDH1*, CDK4, CDKN1B*, CDKN2A, CHEK2*, CTNNA1*, DICER1*, EPCAM (del/dup only), EGFR, FH*, FLCN*, GREM1 (promoter dup only), HOXB13, KIT, LZTR1, MAX*, MBD4, MEN1*, MET, MITF, MLH1*, MSH2*, MSH3*, MSH6*, MUTYH*, NF1*, NF2*, NTHL1*, PALB2*, PDGFRA, PMS2*, POLD1*, POLE*, POT1*, PRKAR1A*, PTCH1*, PTEN*, RAD51C*, RAD51D*, RB1*, RET, SDHA* (sequencing only), SDHAF2*, SDHB*, SDHC*, SDHD*, SMAD4*, SMARCA4*, SMARCB1*, SMARCE1*, STK11*, SUFU*, TMEM127*, TP53*,  TSC1*, TSC2*, VHL*. RNA analysis is performed for * genes.   11/13/2023 -  Chemotherapy   Patient is on Treatment Plan : GASTROESOPHAGEAL FOLFOX q14d x 6 cycles        Discussed the use of AI scribe software for clinical note transcription with the patient, who gave verbal consent to proceed.  History of Present Illness   The patient, with a history of esophageal cancer and COPD, presents for follow-up after a recent hospitalization for an acute exacerbation of COPD. He reports that he was struggling to breathe and his oxygen levels dropped significantly, necessitating an overnight hospital stay. He received breathing treatments and a CT scan during his hospital stay. The patient also reports a recent fall that resulted in a broken arm and required stitches in his hand. He saw an orthopedic surgeon who confirmed a clean break and did not recommend surgery. The patient also mentions a loose tooth that is likely to fall out soon. He has been experiencing some side effects from his recent chemo-radiation therapy, including nausea and hair loss. He reports difficulty eating and has been taking Ensure to help with nutrition. The patient also mentions a cough that has improved with prescribed cough medicine.         All other systems were reviewed with the patient and are negative.  MEDICAL HISTORY:  Past Medical History:  Diagnosis Date   Allergy    seasonal   Aneurysm of infrarenal abdominal aorta (HCC)    4.2 cm 07/12/23   Arthritis    Colon cancer (HCC)    Compression fracture of thoracic vertebra with routine healing 04/30/2020   COPD (chronic obstructive pulmonary disease) (HCC)    Family history of adverse reaction to anesthesia    mother slow to awaken   GERD (gastroesophageal reflux disease)  Hepatitis    hepatitis c dx in remission per pt   Hypertension    Inguinal hernia    Multiple fractures 06/04/2020   Positive colorectal cancer screening using Cologuard test 08/27/2020    Substance abuse (HCC)    Wears glasses    for reading    SURGICAL HISTORY: Past Surgical History:  Procedure Laterality Date   COLONOSCOPY  11/11/2020   ENDOSCOPIC MUCOSAL RESECTION N/A 03/10/2021   Procedure: ENDOSCOPIC MUCOSAL RESECTION;  Surgeon: Lemar Lofty., MD;  Location: [facility];  Service: Gastroenterology;  Laterality: N/A;   ESOPHAGOGASTRODUODENOSCOPY (EGD) WITH PROPOFOL N/A 07/27/2023   Procedure: ESOPHAGOGASTRODUODENOSCOPY (EGD) WITH PROPOFOL;  Surgeon: Meridee Score Netty Starring., MD;  Location: [facility];  Service: Gastroenterology;  Laterality: N/A;   EUS N/A 07/27/2023   Procedure: UPPER ENDOSCOPIC ULTRASOUND (EUS) RADIAL;  Surgeon: Lemar Lofty., MD;  Location: [facility];  Service: Gastroenterology;  Laterality: N/A;   FLEXIBLE SIGMOIDOSCOPY N/A 03/10/2021   Procedure: FLEXIBLE SIGMOIDOSCOPY;  Surgeon: Meridee Score Netty Starring., MD;  Location: [facility];  Service: Gastroenterology;  Laterality: N/A;   HEMOSTASIS CLIP PLACEMENT  03/10/2021   Procedure: HEMOSTASIS CLIP PLACEMENT;  Surgeon: Lemar Lofty., MD;  Location: [facility];  Service: Gastroenterology;;   INGUINAL HERNIA REPAIR Right 01/19/2021   Procedure: LAPAROSCOPIC RIGHT INGUINAL HERNIA REPAIR WITH MESH;  Surgeon: Stechschulte, Hyman Hopes, MD;  Location: [facility];  Service: General;  Laterality: Right;   IR IMAGING GUIDED PORT INSERTION  08/17/2023   obstruction removed as a child   swallowed a peanut   POLYPECTOMY  03/10/2021   Procedure: POLYPECTOMY;  Surgeon: Mansouraty, Netty Starring., MD;  Location: [facility];  Service: Gastroenterology;;   POLYPECTOMY     SUBMUCOSAL LIFTING INJECTION  03/10/2021   Procedure: SUBMUCOSAL LIFTING INJECTION;  Surgeon: Lemar Lofty., MD;  Location: [facility];  Service: Gastroenterology;;    I have reviewed the social history and family history with the patient and they are unchanged from previous  note.  ALLERGIES:  has No Known Allergies.  MEDICATIONS:  Current Outpatient Medications  Medication Sig Dispense Refill   albuterol (VENTOLIN HFA) 108 (90 Base) MCG/ACT inhaler Inhale 2 puffs into the lungs every 6 (six) hours as needed for wheezing or shortness of breath. 18 g 2   amLODipine (NORVASC) 10 MG tablet Take 1 tablet (10 mg total) by mouth daily. 90 tablet 2   azithromycin (ZITHROMAX) 500 MG tablet Take 1 tablet (500 mg total) by mouth daily for 4 days. 4 tablet 0   cefdinir (OMNICEF) 300 MG capsule Take 1 capsule (300 mg total) by mouth 2 (two) times daily for 4 days. 8 capsule 0   DELSYM 30 MG/5ML liquid Take 30 mg by mouth 2 (two) times daily as needed for cough.     fluticasone-salmeterol (ADVAIR HFA) 115-21 MCG/ACT inhaler Inhale 2 puffs into the lungs 2 (two) times daily. 12 g 12   guaiFENesin-codeine 100-10 MG/5ML syrup Take 5 mLs by mouth every 6 (six) hours as needed for cough. 473 mL 0   lidocaine-prilocaine (EMLA) cream Apply 1 Application topically as needed. (Patient taking differently: Apply 1 Application topically as needed (for port access).) 30 g 0   Multiple Vitamins-Minerals (CENTRUM SILVER 50+MEN) TABS Take 1 tablet by mouth daily with breakfast.     omeprazole (PRILOSEC) 40 MG capsule Take 1 capsule (40 mg total) by mouth daily before dinner. 60 capsule 3   oxyCODONE-acetaminophen (PERCOCET/ROXICET) 5-325 MG tablet Take 1 tablet by mouth every 6 (  six) hours as needed for severe pain (pain score 7-10). 10 tablet 0   predniSONE (DELTASONE) 20 MG tablet Take 2 tablets (40 mg total) by mouth daily with breakfast for 4 days. 8 tablet 0   promethazine-dextromethorphan (PROMETHAZINE-DM) 6.25-15 MG/5ML syrup Take 5 mLs by mouth 4 (four) times daily as needed for cough. 240 mL 1   sildenafil (VIAGRA) 100 MG tablet Take 1 tablet (100 mg total) by mouth daily as needed for erectile dysfunction. 10 tablet 3   sucralfate (CARAFATE) 1 g tablet Take 1 tablet (1 g total) by  mouth 4 (four) times daily -  with meals and at bedtime. 60 tablet 1   No current facility-administered medications for this visit.    PHYSICAL EXAMINATION: ECOG PERFORMANCE STATUS: 2 - Symptomatic, <50% confined to bed  Vitals:   10/10/23 1458 10/10/23 1459  BP: (!) 182/81 (!) 169/88  Pulse: 100   Resp: 19   Temp: 98.9 F (37.2 C)   SpO2: 94%    Wt Readings from Last 3 Encounters:  10/10/23 166 lb 12.8 oz (75.7 kg)  10/09/23 171 lb 15.3 oz (78 kg)  10/06/23 171 lb 15.3 oz (78 kg)     GENERAL:alert, no distress and comfortable SKIN: skin color, texture, turgor are normal, no rashes or significant lesions EYES: normal, Conjunctiva are pink and non-injected, sclera clear NECK: supple, thyroid normal size, non-tender, without nodularity LYMPH:  no palpable lymphadenopathy in the cervical, axillary  LUNGS: clear to auscultation and percussion with normal breathing effort HEART: regular rate & rhythm and no murmurs and no lower extremity edema ABDOMEN:abdomen soft, non-tender and normal bowel sounds Musculoskeletal:no cyanosis of digits and no clubbing  NEURO: alert & oriented x 3 with fluent speech, no focal motor/sensory deficits      LABORATORY DATA:  I have reviewed the data as listed    Latest Ref Rng & Units 10/10/2023    2:36 PM 10/10/2023    6:30 AM 10/10/2023   12:15 AM  CBC  WBC 4.0 - 10.5 K/uL 10.7  9.3  8.6   Hemoglobin 13.0 - 17.0 g/dL 91.4  78.2  95.6   Hematocrit 39.0 - 52.0 % 35.7  34.0  36.6   Platelets 150 - 400 K/uL 359  271  303         Latest Ref Rng & Units 10/10/2023    2:36 PM 10/10/2023    4:05 AM 10/10/2023   12:15 AM  CMP  Glucose 70 - 99 mg/dL 213  086    BUN 8 - 23 mg/dL 30  28    Creatinine 5.78 - 1.24 mg/dL 4.69  6.29  5.28   Sodium 135 - 145 mmol/L 136  132    Potassium 3.5 - 5.1 mmol/L 4.0  4.4    Chloride 98 - 111 mmol/L 101  100    CO2 22 - 32 mmol/L 27  23    Calcium 8.9 - 10.3 mg/dL 9.4  8.5    Total Protein 6.5 - 8.1  g/dL 7.4     Total Bilirubin <1.2 mg/dL 0.4     Alkaline Phos 38 - 126 U/L 127     AST 15 - 41 U/L 30     ALT 0 - 44 U/L 47         RADIOGRAPHIC STUDIES: I have personally reviewed the radiological images as listed and agreed with the findings in the report. CT Angio Chest Pulmonary Embolism (PE) W or WO Contrast  Result  Date: 10/10/2023 CLINICAL DATA:  High probability pulmonary embolism, dyspnea, wheezing, tachycardia. Esophageal cancer EXAM: CT ANGIOGRAPHY CHEST WITH CONTRAST TECHNIQUE: Multidetector CT imaging of the chest was performed using the standard protocol during bolus administration of intravenous contrast. Multiplanar CT image reconstructions and MIPs were obtained to evaluate the vascular anatomy. RADIATION DOSE REDUCTION: This exam was performed according to the departmental dose-optimization program which includes automated exposure control, adjustment of the mA and/or kV according to patient size and/or use of iterative reconstruction technique. CONTRAST:  75mL OMNIPAQUE IOHEXOL 350 MG/ML SOLN COMPARISON:  10/09/2023 FINDINGS: Cardiovascular: There is adequate opacification of the pulmonary arterial tree. No intraluminal filling defect identified to suggest acute pulmonary embolism. The central pulmonary arteries are enlarged in keeping with changes of pulmonary arterial hypertension. Calcification of the aortic valve leaflets noted. Mild coronary artery calcification. Global cardiac size within normal limits. No pericardial effusion. Mild atherosclerotic calcification within the thoracic aorta. No aortic aneurysm. Right internal jugular chest port in place with its tip at the superior cavoatrial junction. Mediastinum/Nodes: Visualized thyroid is unremarkable. No pathologic thoracic adenopathy. Circumferential thickening of the distal esophagus is again identified likely reactive reflecting the patient's primary malignancy circumferential thickening of the distal esophagus proximal to  this level may reflect changes esophagitis though malignant infiltration would be difficult to exclude. This is not well assessed on this examination. Lungs/Pleura: There is diffuse bronchial wall thickening in keeping with airway inflammation and extensive tree-in-bud nodularity again identified diffusely, stable since immediate prior examination, though new since prior examination of 07/12/2023. In the acute setting, this is most suggestive atypical infection or aspiration. No pneumothorax or pleural effusion. No central obstructing lesion. Upper Abdomen: No acute abnormality. Musculoskeletal: Pathologic fracture of the mid sternal body again identified. No additional lytic or blastic bone lesions. Multiple healed bilateral rib fractures are noted. Review of the MIP images confirms the above findings. IMPRESSION: 1. No pulmonary embolism. 2. Diffuse bronchial wall thickening in keeping with airway inflammation and extensive tree-in-bud nodularity again identified diffusely, stable since immediate prior examination, though new since prior examination of 07/12/2023. In the acute setting, this is most suggestive of atypical infection or aspiration. 3. Circumferential thickening of the distal esophagus likely reflecting the patient's primary malignancy. Circumferential thickening of the distal esophagus proximal to this level may reflect changes esophagitis though malignant infiltration would be difficult to exclude. This is not well assessed on this examination. 4. Pathologic fracture of the mid sternal body again identified. 5. Mild coronary artery calcification. Aortic Atherosclerosis (ICD10-I70.0). Electronically Signed   By: Helyn Numbers M.D.   On: 10/10/2023 01:29   DG CHEST PORT 1 VIEW  Result Date: 10/10/2023 CLINICAL DATA:  [id number] with chest pain, shortness of breath and cough, COPD and esophageal cancer. EXAM: PORTABLE CHEST 1 VIEW COMPARISON:  Chest CT yesterday at 10:00 p.m. FINDINGS: 12:16 a.m.  Right IJ port catheter again terminates at about the superior cavoatrial junction. The lungs are mildly emphysematous. The CT 2 hours ago demonstrated patchy peripheral tree-in-bud opacities and scattered peripheral airspace disease anteriorly, but these findings are not well redemonstrated radiographically. No focal consolidation is seen. There is chest wall attenuation inferiorly. The mediastinum is normally outlined. There is aortic atherosclerosis. The cardiac size is normal. There are healed fractures of several bilateral ribs. Osteopenia with no new osseous findings. IMPRESSION: 1. Emphysema. 2. The CT 2 hours ago demonstrated patchy peripheral tree-in-bud opacities and scattered peripheral airspace disease anteriorly, but these findings are not well redemonstrated radiographically. 3. Aortic atherosclerosis.  4. Osteopenia. Electronically Signed   By: Almira Bar M.D.   On: 10/10/2023 00:46   CT CHEST WO CONTRAST  Result Date: 10/09/2023 CLINICAL DATA:  Respiratory illness, shortness of breath. Esophageal cancer. EXAM: CT CHEST WITHOUT CONTRAST TECHNIQUE: Multidetector CT imaging of the chest was performed following the standard protocol without IV contrast. RADIATION DOSE REDUCTION: This exam was performed according to the departmental dose-optimization program which includes automated exposure control, adjustment of the mA and/or kV according to patient size and/or use of iterative reconstruction technique. COMPARISON:  PET-CT 07/24/2023 FINDINGS: Cardiovascular: Right chest port catheter tip ends in the distal SVC. The heart and aorta are normal in size. There are atherosclerotic calcifications of the aorta. There is no pericardial effusion. Mediastinum/Nodes: Distal esophageal mass appears grossly unchanged, but is not well delineated on this noncontrast study. The esophagus is nondilated. There are no enlarged mediastinal or lymph nodes. The visualized thyroid gland is within normal limits.  Lungs/Pleura: There are new scattered tree-in-bud opacities throughout both lungs with some peripheral patchy airspace opacities in the right middle lobe and lingula. There is no pleural effusion or pneumothorax. There are additional new scattered nodular densities throughout the right lower lobe measuring up to 4 mm. Upper Abdomen: No acute abnormality. Musculoskeletal: There are multiple healed bilateral rib fractures. No acute fractures are seen. There has been progression of the erosive changes and sclerosis of the mid sternum with pathologic fracture. IMPRESSION: 1. New scattered tree-in-bud opacities throughout both lungs with some peripheral patchy airspace opacities in the right middle lobe and lingula. Findings are favored as infectious/inflammatory. 2. Additional new scattered nodular densities throughout the right lower lobe measuring up to 4 mm. These may be infectious/inflammatory, but metastatic disease is not excluded. 3. Distal esophageal mass appears grossly unchanged, but is not well delineated on this noncontrast study. 4. Progression of the erosive changes and sclerosis of the mid sternum with pathologic fracture. 5. Aortic atherosclerosis. Aortic Atherosclerosis (ICD10-I70.0). Electronically Signed   By: Darliss Cheney M.D.   On: 10/09/2023 23:10   DG Chest Port 1 View  Result Date: 10/09/2023 CLINICAL DATA:  Shortness of breath EXAM: PORTABLE CHEST 1 VIEW COMPARISON:  06/04/2020 FINDINGS: Right central line tip at the cavoatrial junction. No pneumothorax. Heart and mediastinal contours within normal limits. No confluent opacities or effusions. No acute bony abnormality. IMPRESSION: No acute cardiopulmonary disease. Electronically Signed   By: Charlett Nose M.D.   On: 10/09/2023 16:59      Orders Placed This Encounter  Procedures   CBC with Differential (Cancer Center Only)    Standing Status:   Future    Standing Expiration Date:   11/12/2024   CMP (Cancer Center only)    Standing  Status:   Future    Standing Expiration Date:   11/12/2024   CBC with Differential (Cancer Center Only)    Standing Status:   Future    Standing Expiration Date:   11/26/2024   CMP (Cancer Center only)    Standing Status:   Future    Standing Expiration Date:   11/26/2024   CBC with Differential (Cancer Center Only)    Standing Status:   Future    Standing Expiration Date:   12/10/2024   CMP (Cancer Center only)    Standing Status:   Future    Standing Expiration Date:   12/10/2024   Ambulatory referral to Pulmonology    Referral Priority:   Routine    Referral Type:   Consultation  Referral Reason:   Specialty Services Required    Requested Specialty:   Pulmonary Disease    Number of Visits Requested:   1   All questions were answered. The patient knows to call the clinic with any problems, questions or concerns. No barriers to learning was detected. The total time spent in the appointment was 30 minutes.     Malachy Mood, MD 10/10/2023

## 2023-10-10 NOTE — Hospital Course (Signed)
Jordan [name] is a 67 y.o. male with a history of COPD and esophogeal cancer.  Patient presented secondary to shortness of breath concerning for a COPD exacerbation and pneumonia based on chest imaging. Patient started empirically on antibiotics and steroids with improvement of symptoms. Patient needed oxygen on admission which was weaned to room air prior to discharge.

## 2023-10-10 NOTE — Progress Notes (Signed)
Call to cancer center to confirm appointment at 1430 today. This RN spoke with Jonny Ruiz, Dr Latanya Maudlin nurse. Pt is to stay accessed- this RN will discharge pt to cancer center via w/c- pt updated on plan. IV/VAST RN also  updated by this RN. PIV removed as noted - dressing reinforced w/ coban.

## 2023-10-10 NOTE — Plan of Care (Signed)
Discuss and review plan of care with patient/family

## 2023-10-10 NOTE — Assessment & Plan Note (Signed)
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  We have reviewed his case in the tumor board, and recommend biopsy of the sternal bone lesion.   -He underwent a EUS staging, which showed 2 tumors in the distal esophagus, staged as T1smN0 and T2N0.  -His sternal bone biopsy was negative for malignant cells. -He started concurrent chemotherapy and radiation on 08/15/2023 with weekly carbo and Taxol.  He completed on September 21, 2023

## 2023-10-10 NOTE — Plan of Care (Addendum)
Was called to bedside as patient appears to be difficult to arouse with increased work of breathing. On my assessment patient at first appeared to be lethargic but once seated up and fully awake was able to make good conversation States he is feeling somewhat better endorsing ongoing significant shortness of breath Patient clinically does appear to be tachypneic and diaphoretic with increased work of breathing Labs and recent admission note and radiological findings reviewed Latest ABG  7.44 Patient temperature 37.1   pCO2 arterial 42 mmHg Collection site RIGHT RADIAL  pO2, Arterial 114 High  mmHg     Of note with increased work of breathing with suspected pCO2 would be lower so suspect pseudonormalization Repeat a chest x-ray given decreased breath sounds on the left worse than right Without significant changes. Patient with history of malignancy with new onset of O2 requirement requiring up to 5 L FiO2 Ordered CTA with contrast to rule out PE  Pathologic sternal fracture -likely contributing to discomfort and increased work of breathing due to splinting Pain control as well as able to tolerate will try to avoid oversedation  CT scan worrisome for pneumonia patient was already started on antibiotics sputum culture strep pneumo and Legionella ordered  Increased work of breathing with acute respiratory failure with hypoxia  Likely in the setting of COPD exacerbation/pneumonia CTA to rule out PE in the setting of malignancy As patient continues to show evidence of tiring out would consider BiPAP support Recommend PCCM consult if patient continues to decompensate  Agitation patient is attempting to get up and use the restroom he was attempted to be redirected but refused insisting on taking off oxygen and going to the bathroom explained to patient that this is dangerous behavior and that his oxygen saturation drops when he tries to ambulate at this point Would attempt to hold off on  oversedation if possible given the patient needs his respiratory drive will monitor carefully  EKG repeated no-nacute Troponin repeated currently pending  Change level of care to progressive Signed out to Ulla Gallo, PA  CRITICAL CARE Performed by: Tayvia Faughnan   Total critical care time: 35 minutes  Critical care time was exclusive of separately billable procedures and treating other patients.  Critical care was necessary to treat or prevent imminent or life-threatening deterioration.  Critical care was time spent personally by me on the following activities: development of treatment plan with patient and/or surrogate as well as nursing, discussions with consultants, evaluation of patient's response to treatment, examination of patient, obtaining history from patient or surrogate, ordering and performing treatments and interventions, ordering and review of laboratory studies, ordering and review of radiographic studies, pulse oximetry and re-evaluation of patient's condition.  Shay Bartoli 1:15 AM

## 2023-10-10 NOTE — Care Management Obs Status (Signed)
MEDICARE OBSERVATION STATUS NOTIFICATION   Patient Details  Name: Jordan [name] MRN: [id number] Date of Birth: 01-13-1956   Medicare Observation Status Notification Given:  Yes    Larrie Kass, LCSW 10/10/2023, 1:36 PM

## 2023-10-10 NOTE — Progress Notes (Signed)
AVS reviewed w/ pt who verbalized an understanding. No other questions at this time. Central tel notified of pt d/c at 1353- removed from tele & pulse ox. Pt dressed for d/c to Cancer center- pt's mom here at bedside- pt to cancer center via w/c. Transfer by this RN

## 2023-10-10 NOTE — Assessment & Plan Note (Signed)
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

## 2023-10-11 ENCOUNTER — Telehealth: Payer: Self-pay

## 2023-10-11 ENCOUNTER — Other Ambulatory Visit: Payer: Self-pay

## 2023-10-11 LAB — T3: T3, Total: 92 ng/dL (ref 71–180)

## 2023-10-11 NOTE — Transitions of Care (Post Inpatient/ED Visit) (Signed)
   10/11/2023  Name: Jordan [name] MRN: [id number] DOB: [birth year]  Today's TOC FU Call Status: Today's TOC FU Call Status:: Unsuccessful Call (1st Attempt) Unsuccessful Call (1st Attempt) Date: 10/11/23  Attempted to reach the patient regarding the most recent Inpatient/ED visit.  Follow Up Plan: Additional outreach attempts will be made to reach the patient to complete the Transitions of Care (Post Inpatient/ED visit) call.   Signature  Robyne Peers, RN

## 2023-10-12 LAB — LEGIONELLA PNEUMOPHILA SEROGP 1 UR AG: L. pneumophila Serogp 1 Ur Ag: NEGATIVE

## 2023-10-14 LAB — CULTURE, BLOOD (ROUTINE X 2)
Culture: NO GROWTH
Special Requests: ADEQUATE

## 2023-10-15 LAB — CULTURE, BLOOD (ROUTINE X 2)
Culture: NO GROWTH
Special Requests: ADEQUATE

## 2023-10-16 ENCOUNTER — Telehealth: Payer: Self-pay

## 2023-10-16 ENCOUNTER — Other Ambulatory Visit: Payer: Self-pay

## 2023-10-16 DIAGNOSIS — S52202A Unspecified fracture of shaft of left ulna, initial encounter for closed fracture: Secondary | ICD-10-CM | POA: Diagnosis not present

## 2023-10-16 DIAGNOSIS — G8918 Other acute postprocedural pain: Secondary | ICD-10-CM | POA: Diagnosis not present

## 2023-10-16 MED ORDER — OXYCODONE HCL 5 MG PO TABS
5.0000 mg | ORAL_TABLET | Freq: Three times a day (TID) | ORAL | 0 refills | Status: DC | PRN
  Filled 2023-10-16: qty 8, 3d supply, fill #0

## 2023-10-16 NOTE — Transitions of Care (Post Inpatient/ED Visit) (Signed)
   10/16/2023  Name: Jordan [name] MRN: [id number] DOB: [birth year]  Today's TOC FU Call Status: Today's TOC FU Call Status:: Unsuccessful Call (2nd Attempt) Unsuccessful Call (1st Attempt) Date: 10/11/23 Unsuccessful Call (2nd Attempt) Date: 10/16/23  Attempted to reach the patient regarding the most recent Inpatient/ED visit.  Follow Up Plan: Additional outreach attempts will be made to reach the patient to complete the Transitions of Care (Post Inpatient/ED visit) call.   Signature Robyne Peers, RN

## 2023-10-16 NOTE — Transitions of Care (Post Inpatient/ED Visit) (Signed)
   10/16/2023  Name: Jordan [name] MRN: [id number] DOB: [birth year]  Today's TOC FU Call Status: Today's TOC FU Call Status:: Successful TOC FU Call Completed Unsuccessful Call (1st Attempt) Date: 10/11/23 Unsuccessful Call (2nd Attempt) Date: 10/16/23 [facility] FU Call Complete Date: 10/16/23 Patient's Name and Date of Birth confirmed.  Transition Care Management Follow-up Telephone Call Date of Discharge: 10/10/23 Discharge Facility: [facility]) Type of Discharge: Inpatient Admission Primary Inpatient Discharge Diagnosis:: hypoxic respiratory failure How have you been since you were released from the hospital?: Better Any questions or concerns?: No (He said everything is fine)  Items Reviewed: Did you receive and understand the discharge instructions provided?: Yes Medications obtained,verified, and reconciled?: Partial Review Completed Reason for Partial Mediation Review: the patient said he has all of his medications and did not have any questions about the med regime and did not need to review the med list. Any new allergies since your discharge?: No Dietary orders reviewed?: Yes Type of Diet Ordered:: heart healthy, low sodium Do you have support at home?: Yes People in Home: parent(s) Name of Support/Comfort Primary Source: He lives with his mother.  Medications Reviewed Today: Medications Reviewed Today   Medications were not reviewed in this encounter     Home Care and Equipment/Supplies: Were Home Health Services Ordered?: No Any new equipment or medical supplies ordered?: No  Functional Questionnaire: Do you need assistance with bathing/showering or dressing?: No Do you need assistance with meal preparation?: No Do you need assistance with eating?: No Do you have difficulty maintaining continence: No Do you need assistance with getting out of bed/getting out of a chair/moving?: No Do you have difficulty managing or taking your medications?: No  Follow up  appointments reviewed: PCP Follow-up appointment confirmed?: Yes Date of PCP follow-up appointment?: 11/13/23 Follow-up Provider: Dr Alvis Lemmings.  He has an appt with orthopedic surgery the same day but thinks he can make both appts. Specialist Hospital Follow-up appointment confirmed?: Yes Date of Specialist follow-up appointment?: 10/23/23 Follow-Up Specialty Provider:: call with the  Cancer Center, He also has an appointment with orthopedics on 11/13/2023 to follow up on his LUE fracture Do you need transportation to your follow-up appointment?: No Do you understand care options if your condition(s) worsen?: Yes-patient verbalized understanding    SIGNATURE Robyne Peers, RN

## 2023-10-17 ENCOUNTER — Ambulatory Visit: Payer: Self-pay

## 2023-10-17 NOTE — Telephone Encounter (Signed)
Jordan [name] from [facility] to review information to give PCP . After long wait time and no contact disconnected. Unable to leave message.

## 2023-10-17 NOTE — Telephone Encounter (Signed)
Patient called, left VM to return the call to the office to speak to the NT.  Also called nurse no answer unable to leave a voicemail. Summary: med management   Fleet Contras w/ [facility] calling to say she had phone appt w/ the pt today.  He told her he is doing his maintenance inhalers just when he wanted.  But he has told the nurse he will be compliant.  Fleet Contras want you to know he was just discharged from the hospital recently.

## 2023-10-17 NOTE — Telephone Encounter (Signed)
Noted  

## 2023-10-17 NOTE — Telephone Encounter (Signed)
  Chief Complaint: f/u call per request of [facility] nurse Fleet Contras. Patient mother on DPR reviewed how patient is more compliant using inhalers and breathing better Symptoms: na Frequency: na Pertinent Negatives: Patient denies na Disposition: [] ED /[] Urgent Care (no appt availability in office) / [] Appointment(In office/virtual)/ []  [place] [facility]/ [x] Home Care/ [] Refused Recommended Disposition /[]  Mobile Bus/ []  Follow-up with PCP Additional Notes:   Called patient to review compliance with inhalers and f/u after discharge per request from [place] from [facility]. Patient not available but spoke with pt mother on DPR. Mother reports patient is doing better and using inhalers more often than before. Mother reports she has not heard from pulmonologist that patient has been referred to yet. Reminded pt mother next OV with PCP 11/13/23. Recommended if further questions or change in status call back.    Summary: med management   Fleet Contras w/ [facility] calling to say she had phone appt w/ the pt today.  He told her he is doing his maintenance inhalers just when he wanted.  But he has told the nurse he will be compliant.  Fleet Contras want you to know he was just discharged from the hospital recently.               Reason for Disposition  [1] Follow-up call to recent contact AND [2] information only call, no triage required  Answer Assessment - Initial Assessment Questions 1. REASON FOR CALL or QUESTION: "What is your reason for calling today?" or "How can I best help you?" or "What question do you have that I can help answer?"     F/u call to review status using inhalers after hospital discharge.  Protocols used: Information Only Call - No Triage-A-AH

## 2023-10-20 NOTE — Progress Notes (Signed)
  Radiation Oncology         [phone] ________________________________  Name: Jordan [name] MRN: [id number]  Date of Service: 10/20/2023 DOB: Dec 31, 1955  Post Treatment Telephone Note  Diagnosis:   At least cT1N0M0, adenocarcinoma of the distal esophagus  (as documented in provider EOT note)  The patient was available for call today.   Symptoms of fatigue have improved since completing therapy.  Symptoms of skin changes have improved since completing therapy.  Symptoms of difficulty swallowing have improved since completing therapy.   The patient has scheduled follow up with his medical oncologist Dr. Mosetta Putt for ongoing surveillance, and with Dr. Basilio Cairo. The patient was encouraged to call if he  develops concerns or questions regarding radiation.   This concludes the interaction.  Ruel Favors, LPN

## 2023-10-23 ENCOUNTER — Ambulatory Visit
Admission: RE | Admit: 2023-10-23 | Discharge: 2023-10-23 | Disposition: A | Discharge status: Home / Self Care | Payer: 59 | Source: Ambulatory Visit | Attending: Radiation Oncology | Admitting: Radiation Oncology

## 2023-11-13 ENCOUNTER — Ambulatory Visit: Payer: 59 | Attending: Family Medicine | Admitting: Family Medicine

## 2023-11-13 ENCOUNTER — Other Ambulatory Visit: Payer: Self-pay

## 2023-11-13 ENCOUNTER — Encounter: Payer: Self-pay | Admitting: Family Medicine

## 2023-11-13 VITALS — BP 153/87 | HR 94 | Ht 71.0 in | Wt 170.6 lb

## 2023-11-13 DIAGNOSIS — C155 Malignant neoplasm of lower third of esophagus: Secondary | ICD-10-CM | POA: Diagnosis not present

## 2023-11-13 DIAGNOSIS — M7501 Adhesive capsulitis of right shoulder: Secondary | ICD-10-CM

## 2023-11-13 DIAGNOSIS — I10 Essential (primary) hypertension: Secondary | ICD-10-CM

## 2023-11-13 DIAGNOSIS — C189 Malignant neoplasm of colon, unspecified: Secondary | ICD-10-CM | POA: Diagnosis not present

## 2023-11-13 DIAGNOSIS — M25511 Pain in right shoulder: Secondary | ICD-10-CM | POA: Diagnosis not present

## 2023-11-13 DIAGNOSIS — S52202D Unspecified fracture of shaft of left ulna, subsequent encounter for closed fracture with routine healing: Secondary | ICD-10-CM | POA: Diagnosis not present

## 2023-11-13 DIAGNOSIS — S52202A Unspecified fracture of shaft of left ulna, initial encounter for closed fracture: Secondary | ICD-10-CM | POA: Diagnosis not present

## 2023-11-13 MED ORDER — CHLORTHALIDONE 25 MG PO TABS
25.0000 mg | ORAL_TABLET | Freq: Every day | ORAL | 1 refills | Status: DC
  Filled 2023-11-13: qty 90, 90d supply, fill #0

## 2023-11-13 NOTE — Progress Notes (Signed)
Subjective:  Patient ID: Jordan [name], male    DOB: 1956-08-11  Age: 67 y.o. MRN: [id number]  CC: Medical Management of Chronic Issues (Right arm pain)   HPI MARCEL SKRAMSTAD is a 67 y.o. year old male with a history of esophageal cancer (completed radiation, currently on chemotherapy), COPD, hypertension, adenocarcinoma of the colon (s/p resection)  Interval History: Discussed the use of AI scribe software for clinical note transcription with the patient, who gave verbal consent to proceed.  Last month he was hospitalized for hypoxia secondary to COPD exacerbation and pneumonia.  Treated with antibiotics and steroids with improvement in his symptoms.  He is currently doing well on his inhalers Advair and albuterol and denies any breathing difficulties.  He is undergoing treatment for esophageal cancer, including radiation and chemotherapy. The patient recently completed six weeks of radiation and chemotherapy and is scheduled to resume chemotherapy in the near future. The patient also recently fractured his left ulna and has been experiencing pain and stiffness in the right shoulder due to overcompensation. The patient has a history of smoking but quit several years ago.  The patient's blood pressure is elevated, and he has not been taking his amlodipine consistently as he had gone out of town but states over the last 2 days he did take his amlodipine.  In the past he was on valsartan/HCTZ but this was discontinued by his previous PCP per patient as it caused pedal edema. The patient is also due for a repeat colonoscopy; last one was in 10/2021.       Past Medical History:  Diagnosis Date   Allergy    seasonal   Aneurysm of infrarenal abdominal aorta (HCC)    4.2 cm 07/12/23   Arthritis    Colon cancer (HCC)    Compression fracture of thoracic vertebra with routine healing 04/30/2020   COPD (chronic obstructive pulmonary disease) (HCC)    Family history of adverse reaction to  anesthesia    mother slow to awaken   GERD (gastroesophageal reflux disease)    Hepatitis    hepatitis c dx in remission per pt   Hypertension    Inguinal hernia    Multiple fractures 06/04/2020   Positive colorectal cancer screening using Cologuard test 08/27/2020   Substance abuse (HCC)    Wears glasses    for reading    Past Surgical History:  Procedure Laterality Date   COLONOSCOPY  11/11/2020   ENDOSCOPIC MUCOSAL RESECTION N/A 03/10/2021   Procedure: ENDOSCOPIC MUCOSAL RESECTION;  Surgeon: Lemar Lofty., MD;  Location: [facility];  Service: Gastroenterology;  Laterality: N/A;   ESOPHAGOGASTRODUODENOSCOPY (EGD) WITH PROPOFOL N/A 07/27/2023   Procedure: ESOPHAGOGASTRODUODENOSCOPY (EGD) WITH PROPOFOL;  Surgeon: Meridee Score Netty Starring., MD;  Location: [facility];  Service: Gastroenterology;  Laterality: N/A;   EUS N/A 07/27/2023   Procedure: UPPER ENDOSCOPIC ULTRASOUND (EUS) RADIAL;  Surgeon: Lemar Lofty., MD;  Location: [facility];  Service: Gastroenterology;  Laterality: N/A;   FLEXIBLE SIGMOIDOSCOPY N/A 03/10/2021   Procedure: FLEXIBLE SIGMOIDOSCOPY;  Surgeon: Meridee Score Netty Starring., MD;  Location: [facility];  Service: Gastroenterology;  Laterality: N/A;   HEMOSTASIS CLIP PLACEMENT  03/10/2021   Procedure: HEMOSTASIS CLIP PLACEMENT;  Surgeon: Lemar Lofty., MD;  Location: [facility];  Service: Gastroenterology;;   INGUINAL HERNIA REPAIR Right 01/19/2021   Procedure: LAPAROSCOPIC RIGHT INGUINAL HERNIA REPAIR WITH MESH;  Surgeon: Dossie Der Hyman Hopes, MD;  Location: [facility];  Service: General;  Laterality: Right;   IR IMAGING  GUIDED PORT INSERTION  08/17/2023   obstruction removed as a child   swallowed a peanut   POLYPECTOMY  03/10/2021   Procedure: POLYPECTOMY;  Surgeon: Mansouraty, Netty Starring., MD;  Location: [facility];  Service: Gastroenterology;;   POLYPECTOMY     SUBMUCOSAL LIFTING INJECTION  03/10/2021    Procedure: SUBMUCOSAL LIFTING INJECTION;  Surgeon: Lemar Lofty., MD;  Location: [facility];  Service: Gastroenterology;;    Family History  Problem Relation Age of Onset   Colon polyps Father    Stomach cancer Father 42   Prostate cancer Father        dx late 52s   Lung cancer Sister 24       small cell; smoking hx   Pancreatic cancer Sister 35   Colon polyps Brother        less than 10 lifetime   Colon polyps Brother        less than 10 lifetime   Lung cancer Maternal Aunt        d. 65s; smoking hx   Prostate cancer Maternal Uncle        x4 mat uncles; dx >50; no mets   Kidney cancer Maternal Uncle 56   Melanoma Paternal Uncle        dx >50; systemic therapy   Lung cancer Paternal Uncle        dx >50   Breast cancer Cousin        mat male cousin; dx <50   Uterine cancer Cousin        mat male cousin dx <50   Esophageal cancer Cousin        mat male cousin dx >50   Colon cancer Neg Hx    Rectal cancer Neg Hx    Inflammatory bowel disease Neg Hx    Liver disease Neg Hx     Social History   Socioeconomic History   Marital status: Divorced    Spouse name: Not on file   Number of children: 0   Years of education: Not on file   Highest education level: Not on file  Occupational History   Not on file  Tobacco Use   Smoking status: Former    Types: Cigarettes    Start date: 2022    Quit date: 1975    Years since quitting: 50.0   Smokeless tobacco: Never  Vaping Use   Vaping status: Never Used  Substance and Sexual Activity   Alcohol use: Not Currently    Alcohol/week: 1.0 standard drink of alcohol    Types: 1 Cans of beer per week    Comment: quit 2000   Drug use: Yes    Frequency: 1.0 times per week    Types: Cocaine    Comment: last use Wednesday 06/28/23   Sexual activity: Not on file  Other Topics Concern   Not on file  Social History Narrative   Not on file   Social Drivers of Health   Financial Resource Strain: Low Risk   (05/31/2023)   Overall Financial Resource Strain (CARDIA)    Difficulty of Paying Living Expenses: Not hard at all  Food Insecurity: No Food Insecurity (10/10/2023)   Hunger Vital Sign    Worried About Running Out of Food in the Last Year: Never true    Ran Out of Food in the Last Year: Never true  Transportation Needs: No Transportation Needs (10/10/2023)   PRAPARE - Administrator, Civil Service (Medical): No  Lack of Transportation (Non-Medical): No  Physical Activity: Insufficiently Active (05/31/2023)   Exercise Vital Sign    Days of Exercise per Week: 3 days    Minutes of Exercise per Session: 30 min  Stress: No Stress Concern Present (05/31/2023)   [employer] of Occupational Health - Occupational Stress Questionnaire    Feeling of Stress : Not at all  Social Connections: Socially Isolated (05/31/2023)   Social Connection and Isolation Panel [NHANES]    Frequency of Communication with Friends and Family: More than three times a week    Frequency of Social Gatherings with Friends and Family: Twice a week    Attends Religious Services: Never    Database administrator or Organizations: No    Attends Engineer, structural: Never    Marital Status: Divorced    No Known Allergies  Outpatient Medications Prior to Visit  Medication Sig Dispense Refill   albuterol (VENTOLIN HFA) 108 (90 Base) MCG/ACT inhaler Inhale 2 puffs into the lungs every 6 (six) hours as needed for wheezing or shortness of breath. 18 g 2   amLODipine (NORVASC) 10 MG tablet Take 1 tablet (10 mg total) by mouth daily. 90 tablet 2   dexamethasone (DECADRON) 4 MG tablet Take 2 tablets (8 mg total) by mouth daily. Start the day after chemotherapy for 2 days. Take with food. 30 tablet 1   fluticasone-salmeterol (ADVAIR HFA) 115-21 MCG/ACT inhaler Inhale 2 puffs into the lungs 2 (two) times daily. 12 g 12   lidocaine-prilocaine (EMLA) cream Apply 1 Application topically as needed. (Patient  taking differently: Apply 1 Application topically as needed (for port access).) 30 g 0   lidocaine-prilocaine (EMLA) cream Apply to affected area once 30 g 3   Multiple Vitamins-Minerals (CENTRUM SILVER 50+MEN) TABS Take 1 tablet by mouth daily with breakfast.     omeprazole (PRILOSEC) 40 MG capsule Take 1 capsule (40 mg total) by mouth daily before dinner. 60 capsule 3   sucralfate (CARAFATE) 1 g tablet Take 1 tablet (1 g total) by mouth 4 (four) times daily -  with meals and at bedtime. 60 tablet 1   DELSYM 30 MG/5ML liquid Take 30 mg by mouth 2 (two) times daily as needed for cough. (Patient not taking: Reported on 11/13/2023)     guaiFENesin-codeine 100-10 MG/5ML syrup Take 5 mLs by mouth every 6 (six) hours as needed for cough. (Patient not taking: Reported on 11/13/2023) 473 mL 0   ondansetron (ZOFRAN) 8 MG tablet Take 1 tablet (8 mg total) by mouth every 8 (eight) hours as needed for nausea or vomiting. Start on the third day after chemotherapy. (Patient not taking: Reported on 11/13/2023) 30 tablet 1   oxyCODONE (OXY IR/ROXICODONE) 5 MG immediate release tablet Take 1 tablet (5 mg total) by mouth every 8 (eight) hours as needed for pain. (Patient not taking: Reported on 11/13/2023) 8 tablet 0   oxyCODONE-acetaminophen (PERCOCET/ROXICET) 5-325 MG tablet Take 1 tablet by mouth every 6 (six) hours as needed for severe pain (pain score 7-10). (Patient not taking: Reported on 11/13/2023) 10 tablet 0   prochlorperazine (COMPAZINE) 10 MG tablet Take 1 tablet (10 mg total) by mouth every 6 (six) hours as needed for nausea or vomiting. (Patient not taking: Reported on 11/13/2023) 30 tablet 1   promethazine-dextromethorphan (PROMETHAZINE-DM) 6.25-15 MG/5ML syrup Take 5 mLs by mouth 4 (four) times daily as needed for cough. (Patient not taking: Reported on 11/13/2023) 240 mL 1   sildenafil (VIAGRA) 100 MG tablet  Take 1 tablet (100 mg total) by mouth daily as needed for erectile dysfunction. 10 tablet 3    No facility-administered medications prior to visit.     ROS Review of Systems  Constitutional:  Negative for activity change and appetite change.  HENT:  Negative for sinus pressure and sore throat.   Respiratory:  Negative for chest tightness, shortness of breath and wheezing.   Cardiovascular:  Negative for chest pain and palpitations.  Gastrointestinal:  Negative for abdominal distention, abdominal pain and constipation.  Genitourinary: Negative.   Musculoskeletal:        See HPI  Psychiatric/Behavioral:  Negative for behavioral problems and dysphoric mood.     Objective:  BP (!) 153/87   Pulse 94   Ht 5\' 11"  (1.803 m)   Wt 170 lb 9.6 oz (77.4 kg)   SpO2 96%   BMI 23.79 kg/m      11/13/2023   11:51 AM 11/13/2023   11:19 AM 10/10/2023    2:59 PM  BP/Weight  Systolic BP 153 156 169  Diastolic BP 87 86 88  Wt. (Lbs)  170.6   BMI  23.79 kg/m2       Physical Exam Constitutional:      Appearance: He is well-developed.  Cardiovascular:     Rate and Rhythm: Normal rate.     Heart sounds: Normal heart sounds. No murmur heard. Pulmonary:     Effort: Pulmonary effort is normal.     Breath sounds: Normal breath sounds. No wheezing or rales.  Chest:     Chest wall: No tenderness.  Abdominal:     General: Bowel sounds are normal. There is no distension.     Palpations: Abdomen is soft. There is no mass.     Tenderness: There is no abdominal tenderness.  Musculoskeletal:     Right lower leg: No edema.     Left lower leg: No edema.     Comments: Left upper extremity in a cast Severely restricted ROM in right shoulder  Neurological:     Mental Status: He is alert and oriented to person, place, and time.  Psychiatric:        Mood and Affect: Mood normal.        Latest Ref Rng & Units 10/10/2023    2:36 PM 10/10/2023    4:05 AM 10/10/2023   12:15 AM  CMP  Glucose 70 - 99 mg/dL 638  756    BUN 8 - 23 mg/dL 30  28    Creatinine 4.33 - 1.24 mg/dL 2.95   1.88  4.16   Sodium 135 - 145 mmol/L 136  132    Potassium 3.5 - 5.1 mmol/L 4.0  4.4    Chloride 98 - 111 mmol/L 101  100    CO2 22 - 32 mmol/L 27  23    Calcium 8.9 - 10.3 mg/dL 9.4  8.5    Total Protein 6.5 - 8.1 g/dL 7.4     Total Bilirubin <1.2 mg/dL 0.4     Alkaline Phos 38 - 126 U/L 127     AST 15 - 41 U/L 30     ALT 0 - 44 U/L 47       Lipid Panel     Component Value Date/Time   CHOL 164 01/10/2022 1359   TRIG 250 (H) 01/10/2022 1359   HDL 38 (L) 01/10/2022 1359   CHOLHDL 4.3 01/10/2022 1359   LDLCALC 84 01/10/2022 1359    CBC  Component Value Date/Time   WBC 10.7 (H) 10/10/2023 1436   WBC 9.3 10/10/2023 0630   RBC 4.13 (L) 10/10/2023 1436   HGB 12.3 (L) 10/10/2023 1436   HGB 15.7 05/16/2023 1416   HCT 35.7 (L) 10/10/2023 1436   HCT 47.1 05/16/2023 1416   PLT 359 10/10/2023 1436   PLT 256 05/16/2023 1416   MCV 86.4 10/10/2023 1436   MCV 86 05/16/2023 1416   MCH 29.8 10/10/2023 1436   MCHC 34.5 10/10/2023 1436   RDW 17.5 (H) 10/10/2023 1436   RDW 15.0 05/16/2023 1416   LYMPHSABS 0.4 (L) 10/10/2023 1436   LYMPHSABS 1.5 05/16/2023 1416   MONOABS 0.9 10/10/2023 1436   EOSABS 0.0 10/10/2023 1436   EOSABS 0.3 05/16/2023 1416   BASOSABS 0.1 10/10/2023 1436   BASOSABS 0.1 05/16/2023 1416    No results found for: "HGBA1C"  Assessment & Plan:      Hypertension Elevated blood pressure during visit, possibly due to missed doses of Amlodipine. -Added Chlorthalidone to medication regimen. -Continue Amlodipine and maintain a low sodium diet. -Counseled on blood pressure goal of less than 130/80, low-sodium, DASH diet, medication compliance, 150 minutes of moderate intensity exercise per week. Discussed medication compliance, adverse effects. -Repeat blood pressure check at next visit.  Esophageal Cancer Currently undergoing chemotherapy and radiation treatment. -Continue current treatment plan as directed by oncologist.  Left Ulna  Fracture Overcompensation with right arm leading to right shoulder pain. -Orthopedic consultation scheduled for cast removal and pain management for today.  Right Shoulder adhesive capsulitis Likely due to overuse following left ulna fracture. -Might benefit from cortisone injection -Discuss pain management options with orthopedic specialist.  History of colon cancer Due for follow-up colonoscopy, last performed two years ago. -Urgent referral placed for colonoscopy but I have noted that his GI doctor stated colonoscopy be moved to 03/2024 due to ongoing esophageal ca treatment. -Schedule colonoscopy for May 2025 as per GI doctor's recommendation.  COPD No current exacerbation, patient reports good control with Advair and Albuterol. -Continue current medication regimen.  Follow-up in six months.          Meds ordered this encounter  Medications   chlorthalidone (HYGROTON) 25 MG tablet    Sig: Take 1 tablet (25 mg total) by mouth daily.    Dispense:  90 tablet    Refill:  1    Follow-up: Return in about 6 months (around 05/13/2024) for Chronic medical conditions.       Hoy Register, MD, FAAFP. [facility] [place], [place] [phone]   11/13/2023, 12:10 PM

## 2023-11-13 NOTE — Patient Instructions (Signed)
VISIT SUMMARY:  During today's visit, we reviewed your ongoing treatments and addressed several health concerns. We discussed your hypertension, esophageal cancer treatment, recent left ulna fracture, right shoulder pain, and the need for a follow-up colonoscopy. We also reviewed your COPD management.  YOUR PLAN:  -HYPERTENSION: Hypertension means high blood pressure. Your blood pressure was elevated today, possibly because you missed some doses of your medication, Amlodipine. We have added a new medication, Chlorthalidone, to help control your blood pressure. Please continue taking Amlodipine and follow a low sodium diet. We will check your blood pressure again at your next visit.  -ESOPHAGEAL CANCER: Esophageal cancer is a type of cancer that occurs in the esophagus. You are currently undergoing chemotherapy and radiation treatment. Please continue with your current treatment plan as directed by your oncologist.  -LEFT ULNA FRACTURE: A left ulna fracture is a break in one of the bones in your forearm. You have been using your right arm more to compensate, which has caused pain in your right shoulder. We have scheduled an orthopedic consultation for cast removal and to discuss pain management options.  -RIGHT SHOULDER PAIN: Your right shoulder pain is likely due to overuse following your left ulna fracture. We will discuss pain management options with the orthopedic specialist during your consultation.  -HISTORY OF COLON CANCER :  You are due for a follow-up colonoscopy, which was last done two years ago. We have placed an urgent referral for you to have this procedure.  -COPD: COPD, or chronic obstructive pulmonary disease, is a lung condition that makes it hard to breathe. You reported good control of your symptoms with your current medications, Advair and Albuterol. Please continue with your current medication regimen.  INSTRUCTIONS:  Please follow up in six months for a repeat blood pressure  check and to review your overall health. Make sure to attend your orthopedic consultation for your left ulna fracture and right shoulder pain, and keep your scheduled colonoscopy appointment in May 2025.

## 2023-11-14 ENCOUNTER — Other Ambulatory Visit: Payer: Self-pay

## 2023-11-14 NOTE — Progress Notes (Addendum)
 Pharmacist Chemotherapy Monitoring - Initial Assessment    Anticipated start date: 11/22/23  The following has been reviewed per standard work regarding the patient's treatment regimen: The patient's diagnosis, treatment plan and drug doses, and organ/hematologic function Lab orders and baseline tests specific to treatment regimen  The treatment plan start date, drug sequencing, and pre-medications Prior authorization status  Patient's documented medication list, including drug-drug interaction screen and prescriptions for anti-emetics and supportive care specific to the treatment regimen The drug concentrations, fluid compatibility, administration routes, and timing of the medications to be used The patient's access for treatment and lifetime cumulative dose history, if applicable  The patient's medication allergies and previous infusion related reactions, if applicable   Changes made to treatment plan:  pre-medications    Follow up needed:  N/A   Jordan [name], PharmD PGY2 Oncology Pharmacy Resident   11/14/2023 1:27 PM

## 2023-11-16 ENCOUNTER — Telehealth: Payer: Self-pay | Admitting: Internal Medicine

## 2023-11-16 NOTE — Telephone Encounter (Signed)
 I had moved a recall until May 2025 as he is continuing to be treated for esophageal cancer.  It looks like Dr. Newlin saw this perhaps after she sent the referral.  We should not schedule a colonoscopy at this time.  Whether or not we do it will depend upon the outcome of the esophageal cancer.

## 2023-11-16 NOTE — Telephone Encounter (Signed)
 Called and spoke with patient's mother and notified her of Dr. Marvell Fuller recommendations.

## 2023-11-16 NOTE — Telephone Encounter (Signed)
 Dr. Leone Payor,  Urgent referral in [place] from PCP for a colonoscopy.  Patient has history of esophageal cancer and currently doing treatment.  Please advise scheduling and urgency.  Thanks!

## 2023-11-20 NOTE — Progress Notes (Signed)
 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  We have reviewed his case in the tumor board, and recommend biopsy of the sternal bone lesion.   -He underwent a EUS staging, which showed 2 tumors in the distal esophagus, staged as T1smN0 and T2N0.  -His sternal bone biopsy was negative for malignant cells. -He started concurrent chemotherapy and radiation on 08/15/2023 with weekly carbo and Taxol .  He completed on September 21, 2023 -11/22/2023 -begin esophageal FOLFOX every 2 weeks x 6.  After break will get endoscopy to assess esophageal status.   Plan: Labs reviewed  -CBC showing WBC 7.2; Hgb 15.3; Hct 44.8; Plt 193; Anc 5.1 -CMP - K 4.0; glucose 171; BUN 31; Creatinine 1.56; eGFR 45; Ca 9.6; AST 31; ALT 35; AlkPhos 151 Refill cough suppressant guaifenesin  and codeine . May take as needed and as prescribed.   Advised patient to discuss pain medication for shoulder injury with his orthopedic provider. He states he has appointment on 11/29/2023.  Will proceed with Cycle 1 day 1 FOLFOX. Per pharmacy, no dose adjustment needed for abnormal renal  functions. Will add 500 ml NS to improve hydration status.  Update letter, initially written in October 2024, to expedite Adelfo' brother and his [place]  [place] help care for Jordan [name] and his mother.  Labs/flush, follow up, and treatment as scheduled.   The patient understands the plans discussed today and is in agreement with them.  He knows to contact our office if he develops concerns prior to his next appointment.  I provided 30 minutes of face-to-face time during this encounter and > 50% was spent counseling as documented under my assessment and plan.    Jordan [name] Lessen, NP  [facility] - A DEPT OF [facility] [address] [place] [address] Dept: [phone] Dept Fax: [phone]   No orders of the defined types were placed in this encounter.     CHIEF COMPLAINT:  CC: F/U esophageal cancer   Current Treatment: Concurrent chemoradiation with carboplatin  and Taxol  completed September 21, 2023.  FOLFOX every 2 weeks x 6 starting 11/22/2023  INTERVAL HISTORY:  Jordan [name] is here today for repeat clinical assessment.  He was last seen by Dr. Lanny on 10/10/2023.  Follow-up colonoscopy was initially to be done in December 2024.  Was postponed until May 2025 due to esophageal cancer Dr. Avram, GI, has asked colonoscopy not be scheduled while patient being treated for esophageal cancer. The patient completed concurrent chemoradiation, with carboplatin  and Taxol , in mid November.  Today, 11/22/2023, he is scheduled to start FOLFOX.  He is to have this every 2 weeks for 6 treatments, followed by a break, then endoscopy  to assess esophageal status.  Today is cycle 1 day 1, esophageal FOLFOX.  Since his last visit, he had a fall from a ladder. He fractured his left wrist. He is now in soft splint. Having pain in his right shoulder. He feel as though this is due to compensation from left wrist fracture. He does  have a follow up appointment scheduled with orthopedics on 11/29/2023. He also asks for a new letter to send to his brother so that his brother and wife can have expedited return to the [place]  to help care for their mother and himself while he is undergoing treatment. Today, he denies chest pain, chest pressure, or shortness of breath. He denies headaches or visual disturbances. He denies abdominal pain, nausea, vomiting, or changes in bowel or bladder habits.   He denies fevers or chills. He denies pain. His appetite is good. His weight has increased 6 pounds over last 6 weeks .  I have reviewed the past medical history, past surgical history, social history and family history with the patient and they are unchanged from previous note.  ALLERGIES:  has no known allergies.  MEDICATIONS:  Current Outpatient Medications  Medication Sig Dispense Refill   albuterol  (VENTOLIN  HFA) 108 (90 Base) MCG/ACT inhaler Inhale 2 puffs into the lungs every 6 (six) hours as needed for wheezing or shortness of breath. 18 g 2   amLODipine  (NORVASC ) 10 MG tablet Take 1 tablet (10 mg total) by mouth daily. 90 tablet 2   chlorthalidone  (HYGROTON ) 25 MG tablet Take 1 tablet (25 mg total) by mouth daily. 90 tablet 1   dexamethasone  (DECADRON ) 4 MG tablet Take 2 tablets (8 mg total) by mouth daily. Start the day after chemotherapy for 2 days. Take with food. 30 tablet 1   fluticasone -salmeterol (ADVAIR  HFA) 115-21 MCG/ACT inhaler Inhale 2 puffs into the lungs 2 (two) times daily. 12 g 12   lidocaine -prilocaine  (EMLA ) cream Apply 1 Application topically as needed. (Patient taking differently: Apply 1 Application topically as needed (for port access).) 30 g 0   lidocaine -prilocaine  (EMLA ) cream Apply to affected area once 30 g 3   Multiple Vitamins-Minerals (CENTRUM SILVER 50+MEN) TABS Take 1 tablet by mouth daily with breakfast.     omeprazole  (PRILOSEC) 40 MG capsule Take 1 capsule (40 mg total) by mouth daily before  dinner. 60 capsule 3   ondansetron  (ZOFRAN ) 8 MG tablet Take 1 tablet (8 mg total) by mouth every 8 (eight) hours as needed for nausea or vomiting. Start on the third day after chemotherapy. 30 tablet 1   prochlorperazine  (COMPAZINE ) 10 MG tablet Take 1 tablet (10 mg total) by mouth every 6 (six) hours as needed for nausea or vomiting. 30 tablet 1   sucralfate  (CARAFATE ) 1 g tablet Take 1 tablet (1 g total) by mouth 4 (four) times daily -  with meals and at bedtime. 60 tablet 1   DELSYM 30 MG/5ML liquid Take 30 mg by mouth 2 (two) times daily as needed for cough. (Patient not taking: Reported on 11/13/2023)     guaiFENesin -codeine  100-10 MG/5ML syrup Take 5 mLs by mouth every 6 (six) hours as needed for cough. 473 mL 0   oxyCODONE  (OXY IR/ROXICODONE ) 5 MG immediate release tablet Take 1 tablet (5 mg total) by mouth every 8 (eight) hours as needed for pain. (Patient not taking: Reported on 11/22/2023) 8 tablet 0   oxyCODONE -acetaminophen  (PERCOCET/ROXICET) 5-325 MG tablet Take 1 tablet by mouth every 6 (six) hours as needed for  severe pain (pain score 7-10). (Patient not taking: Reported on 11/13/2023) 10 tablet 0   sildenafil  (VIAGRA ) 100 MG tablet Take 1 tablet (100 mg total) by mouth daily as needed for erectile dysfunction. 10 tablet 3   No current facility-administered medications for this visit.   Facility-Administered Medications Ordered in Other Visits  Medication Dose Route Frequency Provider Last Rate Last Admin   dextrose  5 % solution   Intravenous Continuous Jordan Callander, MD 10 mL/hr at 11/22/23 1242 New Bag at 11/22/23 1242   fluorouracil  (ADRUCIL ) 5,000 mg in sodium chloride  0.9 % 150 mL chemo infusion  2,400 mg/m2 (Treatment Plan Recorded) Intravenous 1 day or 1 dose Jordan Callander, MD       fluorouracil  (ADRUCIL ) chemo injection 800 mg  400 mg/m2 (Treatment Plan Recorded) Intravenous Once Jordan Callander, MD       leucovorin  780 mg in dextrose  5 % 250 mL infusion  400 mg/m2 (Treatment Plan Recorded)  Intravenous Once Jordan Callander, MD       oxaliplatin  (ELOXATIN ) 165 mg in dextrose  5 % 500 mL chemo infusion  85 mg/m2 (Treatment Plan Recorded) Intravenous Once Jordan Callander, MD        HISTORY OF PRESENT ILLNESS:   Oncology History  Adenocarcinoma of colon (HCC)  01/19/2021 Initial Diagnosis   Adenocarcinoma of colon (HCC)   08/29/2023 Genetic Testing   Negative [facility] Multi-Cancer +RNA Panel.  Report date is 08/29/2023.   The Multi-Cancer + RNA Panel offered by [facility] includes sequencing and/or deletion/duplication analysis of the following 70 genes:  AIP*, ALK, APC*, ATM*, AXIN2*, BAP1*, BARD1*, BLM*, BMPR1A*, BRCA1*, BRCA2*, BRIP1*, CDC73*, CDH1*, CDK4, CDKN1B*, CDKN2A, CHEK2*, CTNNA1*, DICER1*, EPCAM (del/dup only), EGFR, FH*, FLCN*, GREM1 (promoter dup only), HOXB13, KIT, LZTR1, MAX*, MBD4, MEN1*, MET, MITF, MLH1*, MSH2*, MSH3*, MSH6*, MUTYH*, NF1*, NF2*, NTHL1*, PALB2*, PDGFRA, PMS2*, POLD1*, POLE*, POT1*, PRKAR1A*, PTCH1*, PTEN*, RAD51C*, RAD51D*, RB1*, RET, SDHA* (sequencing only), SDHAF2*, SDHB*, SDHC*, SDHD*, SMAD4*, SMARCA4*, SMARCB1*, SMARCE1*, STK11*, SUFU*, TMEM127*, TP53*, TSC1*, TSC2*, VHL*. RNA analysis is performed for * genes.   Esophageal cancer (HCC)  07/13/2023 Initial Diagnosis   Esophageal cancer (HCC)   08/15/2023 - 09/18/2023 Chemotherapy   Patient is on Treatment Plan : ESOPHAGUS Carboplatin  + Paclitaxel  Weekly X 6 Weeks with XRT     08/29/2023 Genetic Testing   Negative [facility] Multi-Cancer +RNA Panel.  Report date is 08/29/2023.   The Multi-Cancer + RNA Panel offered by [facility] includes sequencing and/or deletion/duplication analysis of the following 70 genes:  AIP*, ALK, APC*, ATM*, AXIN2*, BAP1*, BARD1*, BLM*, BMPR1A*, BRCA1*, BRCA2*, BRIP1*, CDC73*, CDH1*, CDK4, CDKN1B*, CDKN2A, CHEK2*, CTNNA1*, DICER1*, EPCAM (del/dup only), EGFR, FH*, FLCN*, GREM1 (promoter dup only), HOXB13, KIT, LZTR1, MAX*, MBD4, MEN1*, MET, MITF, MLH1*, MSH2*, MSH3*, MSH6*, MUTYH*, NF1*, NF2*,  NTHL1*, PALB2*, PDGFRA, PMS2*, POLD1*, POLE*, POT1*, PRKAR1A*, PTCH1*, PTEN*, RAD51C*, RAD51D*, RB1*, RET, SDHA* (sequencing only), SDHAF2*, SDHB*, SDHC*, SDHD*, SMAD4*, SMARCA4*, SMARCB1*, SMARCE1*, STK11*, SUFU*, TMEM127*, TP53*, TSC1*, TSC2*, VHL*. RNA analysis is performed for * genes.   11/22/2023 -  Chemotherapy   Patient is on Treatment Plan : GASTROESOPHAGEAL FOLFOX q14d x 6 cycles         REVIEW OF SYSTEMS:   Constitutional: Denies fevers, chills or abnormal weight loss Eyes: Denies blurriness of vision Ears, nose, mouth, throat, and face: Denies mucositis or sore throat Respiratory: Denies dyspnea or wheezes. Does have persistent cough since radiation treatment  Cardiovascular: Denies palpitation, chest discomfort or lower extremity swelling Gastrointestinal:  Denies nausea, heartburn or change in  bowel habits Skin: Denies abnormal skin rashes Lymphatics: Denies new lymphadenopathy or easy bruising Neurological:Denies numbness, tingling or new weaknesses Behavioral/Psych: Mood is stable, no new changes  All other systems were reviewed with the patient and are negative.   VITALS:   Today's Vitals   11/22/23 1109 11/22/23 1119  BP: 139/66   Pulse: 85   Resp: 18   Temp: 98 F (36.7 C)   TempSrc: Temporal   SpO2: 98%   Weight: 172 lb 4.8 oz (78.2 kg)   Height: 5' 11 (1.803 m)   PainSc:  9    Body mass index is 24.03 kg/m.   Wt Readings from Last 3 Encounters:  11/22/23 172 lb 4.8 oz (78.2 kg)  11/13/23 170 lb 9.6 oz (77.4 kg)  10/10/23 166 lb 12.8 oz (75.7 kg)    Body mass index is 24.03 kg/m.  Performance status (ECOG): 1 - Symptomatic but completely ambulatory  PHYSICAL EXAM:   GENERAL:alert, no distress and comfortable SKIN: skin color, texture, turgor are normal, no rashes or significant lesions EYES: normal, Conjunctiva are pink and non-injected, sclera clear OROPHARYNX:no exudate, no erythema and lips, buccal mucosa, and tongue normal  NECK: supple,  thyroid  normal size, non-tender, without nodularity LYMPH:  no palpable lymphadenopathy in the cervical, axillary or inguinal LUNGS: clear to auscultation and percussion with normal breathing effort HEART: regular rate & rhythm and no murmurs and no lower extremity edema ABDOMEN:abdomen soft, non-tender and normal bowel sounds Musculoskeletal:no cyanosis of digits and no clubbing  NEURO: alert & oriented x 3 with fluent speech, no focal motor/sensory deficits  LABORATORY DATA:  I have reviewed the data as listed    Component Value Date/Time   NA 136 11/22/2023 1042   NA 141 05/16/2023 1416   K 4.0 11/22/2023 1042   CL 100 11/22/2023 1042   CO2 27 11/22/2023 1042   GLUCOSE 171 (H) 11/22/2023 1042   BUN 31 (H) 11/22/2023 1042   BUN 30 (H) 05/16/2023 1416   CREATININE 1.56 (H) 11/22/2023 1042   CALCIUM  9.6 11/22/2023 1042   PROT 7.6 11/22/2023 1042   PROT 6.5 05/16/2023 1416   ALBUMIN 4.0 11/22/2023 1042   ALBUMIN 4.0 05/16/2023 1416   AST 31 11/22/2023 1042   ALT 35 11/22/2023 1042   ALT 38 05/19/2022 1107   ALKPHOS 151 (H) 11/22/2023 1042   BILITOT 0.4 11/22/2023 1042   GFRNONAA 48 (L) 11/22/2023 1042   GFRAA >60 04/06/2020 0719    Lab Results  Component Value Date   WBC 7.2 11/22/2023   NEUTROABS 5.1 11/22/2023   HGB 15.3 11/22/2023   HCT 44.8 11/22/2023   MCV 90.1 11/22/2023   PLT 193 11/22/2023

## 2023-11-20 NOTE — Assessment & Plan Note (Addendum)
 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  We have reviewed his case in the tumor board, and recommend biopsy of the sternal bone lesion.   -He underwent a EUS staging, which showed 2 tumors in the distal esophagus, staged as T1smN0 and T2N0.  -His sternal bone biopsy was negative for malignant cells. -He started concurrent chemotherapy and radiation on 08/15/2023 with weekly carbo and Taxol .  He completed on September 21, 2023 -11/22/2023 -begin esophageal FOLFOX every 2 weeks x 6.  After break will get endoscopy to assess esophageal status. --per pharmacy, no dose reduction needed for altered renal function. Will add additional 500 ml NS to improve hydration status.

## 2023-11-22 ENCOUNTER — Inpatient Hospital Stay: Payer: 59

## 2023-11-22 ENCOUNTER — Inpatient Hospital Stay: Payer: 59 | Attending: Hematology

## 2023-11-22 ENCOUNTER — Inpatient Hospital Stay: Payer: 59 | Admitting: Dietician

## 2023-11-22 ENCOUNTER — Inpatient Hospital Stay: Payer: 59 | Admitting: Nurse Practitioner

## 2023-11-22 ENCOUNTER — Encounter: Payer: Self-pay | Admitting: Nurse Practitioner

## 2023-11-22 ENCOUNTER — Encounter: Payer: Self-pay | Admitting: Hematology

## 2023-11-22 ENCOUNTER — Other Ambulatory Visit: Payer: Self-pay

## 2023-11-22 VITALS — BP 139/66 | HR 85 | Temp 98.0°F | Resp 18 | Ht 71.0 in | Wt 172.3 lb

## 2023-11-22 DIAGNOSIS — Z9221 Personal history of antineoplastic chemotherapy: Secondary | ICD-10-CM | POA: Diagnosis not present

## 2023-11-22 DIAGNOSIS — Z79899 Other long term (current) drug therapy: Secondary | ICD-10-CM | POA: Insufficient documentation

## 2023-11-22 DIAGNOSIS — Z7952 Long term (current) use of systemic steroids: Secondary | ICD-10-CM | POA: Insufficient documentation

## 2023-11-22 DIAGNOSIS — Z923 Personal history of irradiation: Secondary | ICD-10-CM | POA: Diagnosis not present

## 2023-11-22 DIAGNOSIS — C155 Malignant neoplasm of lower third of esophagus: Secondary | ICD-10-CM | POA: Diagnosis present

## 2023-11-22 DIAGNOSIS — M25511 Pain in right shoulder: Secondary | ICD-10-CM | POA: Diagnosis not present

## 2023-11-22 DIAGNOSIS — Z7951 Long term (current) use of inhaled steroids: Secondary | ICD-10-CM | POA: Insufficient documentation

## 2023-11-22 DIAGNOSIS — Z95828 Presence of other vascular implants and grafts: Secondary | ICD-10-CM

## 2023-11-22 DIAGNOSIS — R634 Abnormal weight loss: Secondary | ICD-10-CM | POA: Diagnosis not present

## 2023-11-22 DIAGNOSIS — C189 Malignant neoplasm of colon, unspecified: Secondary | ICD-10-CM

## 2023-11-22 DIAGNOSIS — Z5111 Encounter for antineoplastic chemotherapy: Secondary | ICD-10-CM | POA: Diagnosis not present

## 2023-11-22 LAB — CBC WITH DIFFERENTIAL (CANCER CENTER ONLY)
Abs Immature Granulocytes: 0.02 10*3/uL (ref 0.00–0.07)
Basophils Absolute: 0 10*3/uL (ref 0.0–0.1)
Basophils Relative: 1 %
Eosinophils Absolute: 0.1 10*3/uL (ref 0.0–0.5)
Eosinophils Relative: 1 %
HCT: 44.8 % (ref 39.0–52.0)
Hemoglobin: 15.3 g/dL (ref 13.0–17.0)
Immature Granulocytes: 0 %
Lymphocytes Relative: 16 %
Lymphs Abs: 1.1 10*3/uL (ref 0.7–4.0)
MCH: 30.8 pg (ref 26.0–34.0)
MCHC: 34.2 g/dL (ref 30.0–36.0)
MCV: 90.1 fL (ref 80.0–100.0)
Monocytes Absolute: 0.8 10*3/uL (ref 0.1–1.0)
Monocytes Relative: 11 %
Neutro Abs: 5.1 10*3/uL (ref 1.7–7.7)
Neutrophils Relative %: 71 %
Platelet Count: 193 10*3/uL (ref 150–400)
RBC: 4.97 MIL/uL (ref 4.22–5.81)
RDW: 15.5 % (ref 11.5–15.5)
WBC Count: 7.2 10*3/uL (ref 4.0–10.5)
nRBC: 0 % (ref 0.0–0.2)

## 2023-11-22 LAB — CMP (CANCER CENTER ONLY)
ALT: 35 U/L (ref 0–44)
AST: 31 U/L (ref 15–41)
Albumin: 4 g/dL (ref 3.5–5.0)
Alkaline Phosphatase: 151 U/L — ABNORMAL HIGH (ref 38–126)
Anion gap: 9 (ref 5–15)
BUN: 31 mg/dL — ABNORMAL HIGH (ref 8–23)
CO2: 27 mmol/L (ref 22–32)
Calcium: 9.6 mg/dL (ref 8.9–10.3)
Chloride: 100 mmol/L (ref 98–111)
Creatinine: 1.56 mg/dL — ABNORMAL HIGH (ref 0.61–1.24)
GFR, Estimated: 48 mL/min — ABNORMAL LOW (ref >60)
Glucose, Bld: 171 mg/dL — ABNORMAL HIGH (ref 70–99)
Potassium: 4 mmol/L (ref 3.5–5.1)
Sodium: 136 mmol/L (ref 135–145)
Total Bilirubin: 0.4 mg/dL (ref 0.0–1.2)
Total Protein: 7.6 g/dL (ref 6.5–8.1)

## 2023-11-22 MED ORDER — PALONOSETRON HCL INJECTION 0.25 MG/5ML
0.2500 mg | Freq: Once | INTRAVENOUS | Status: AC
  Administered 2023-11-22: 0.25 mg via INTRAVENOUS
  Filled 2023-11-22: qty 5

## 2023-11-22 MED ORDER — GUAIFENESIN-CODEINE 100-10 MG/5ML PO SOLN
5.0000 mL | Freq: Four times a day (QID) | ORAL | 0 refills | Status: DC | PRN
  Filled 2023-11-22: qty 473, 24d supply, fill #0

## 2023-11-22 MED ORDER — DEXAMETHASONE SODIUM PHOSPHATE 10 MG/ML IJ SOLN
10.0000 mg | Freq: Once | INTRAMUSCULAR | Status: AC
  Administered 2023-11-22: 10 mg via INTRAVENOUS
  Filled 2023-11-22: qty 1

## 2023-11-22 MED ORDER — OXALIPLATIN CHEMO INJECTION 100 MG/20ML
85.0000 mg/m2 | Freq: Once | INTRAVENOUS | Status: AC
  Administered 2023-11-22: 165 mg via INTRAVENOUS
  Filled 2023-11-22: qty 13

## 2023-11-22 MED ORDER — FLUOROURACIL CHEMO INJECTION 2.5 GM/50ML
400.0000 mg/m2 | Freq: Once | INTRAVENOUS | Status: AC
  Administered 2023-11-22: 800 mg via INTRAVENOUS
  Filled 2023-11-22: qty 16

## 2023-11-22 MED ORDER — SODIUM CHLORIDE 0.9 % IV SOLN
Freq: Once | INTRAVENOUS | Status: AC
Start: 2023-11-22 — End: 2023-11-22

## 2023-11-22 MED ORDER — DEXTROSE 5 % IV SOLN
INTRAVENOUS | Status: DC
Start: 2023-11-22 — End: 2023-11-22

## 2023-11-22 MED ORDER — SODIUM CHLORIDE 0.9% FLUSH
10.0000 mL | Freq: Once | INTRAVENOUS | Status: AC
  Administered 2023-11-22: 10 mL

## 2023-11-22 MED ORDER — SODIUM CHLORIDE 0.9 % IV SOLN
2400.0000 mg/m2 | INTRAVENOUS | Status: DC
  Administered 2023-11-22: 5000 mg via INTRAVENOUS
  Filled 2023-11-22: qty 100

## 2023-11-22 MED ORDER — LEUCOVORIN CALCIUM INJECTION 350 MG
400.0000 mg/m2 | Freq: Once | INTRAVENOUS | Status: AC
  Administered 2023-11-22: 780 mg via INTRAVENOUS
  Filled 2023-11-22: qty 25

## 2023-11-22 NOTE — Patient Instructions (Signed)
 [facility] - A DEPT OF [facility]  Discharge Instructions: Thank you for choosing [facility] to provide your oncology and hematology care.   If you have a lab appointment with the Cancer Center, please go directly to the Cancer Center and check in at the registration area.   Wear comfortable clothing and clothing appropriate for easy access to any Portacath or PICC line.   We strive to give you quality time with your provider. You may need to reschedule your appointment if you arrive late (15 or more minutes).  Arriving late affects you and other patients whose appointments are after yours.  Also, if you miss three or more appointments without notifying the office, you may be dismissed from the clinic at the provider's discretion.      For prescription refill requests, have your pharmacy contact our office and allow 72 hours for refills to be completed.    Today you received the following chemotherapy and/or immunotherapy agents oxaliplatin. Leucovorin, fluorourcil      To help prevent nausea and vomiting after your treatment, we encourage you to take your nausea medication as directed.  BELOW ARE SYMPTOMS THAT SHOULD BE REPORTED IMMEDIATELY: *FEVER GREATER THAN 100.4 F (38 C) OR HIGHER *CHILLS OR SWEATING *NAUSEA AND VOMITING THAT IS NOT CONTROLLED WITH YOUR NAUSEA MEDICATION *UNUSUAL SHORTNESS OF BREATH *UNUSUAL BRUISING OR BLEEDING *URINARY PROBLEMS (pain or burning when urinating, or frequent urination) *BOWEL PROBLEMS (unusual diarrhea, constipation, pain near the anus) TENDERNESS IN MOUTH AND THROAT WITH OR WITHOUT PRESENCE OF ULCERS (sore throat, sores in mouth, or a toothache) UNUSUAL RASH, SWELLING OR PAIN  UNUSUAL VAGINAL DISCHARGE OR ITCHING   Items with * indicate a potential emergency and should be followed up as soon as possible or go to the Emergency Department if any problems should occur.  Please show the CHEMOTHERAPY  ALERT CARD or IMMUNOTHERAPY ALERT CARD at check-in to the Emergency Department and triage nurse.  Should you have questions after your visit or need to cancel or reschedule your appointment, please contact [facility]  Dept: [phone]  and follow the prompts.  Office hours are 8:00 a.m. to 4:30 p.m. Monday - Friday. Please note that voicemails left after 4:00 p.m. may not be returned until the following business day.  We are closed weekends and major holidays. You have access to a nurse at all times for urgent questions. Please call the main number to the clinic Dept: [phone] and follow the prompts.   For any non-urgent questions, you may also contact your provider using MyChart. We now offer e-Visits for anyone 47 and older to request care online for non-urgent symptoms. For details visit [email or web address].   Also download the MyChart app! Go to the app store, search "MyChart", open the app, select [place], and log in with your MyChart username and password.

## 2023-11-22 NOTE — Progress Notes (Signed)
 Nutrition Follow-up:  Patient with esophageal cancer. S/p concurrent chemoradiation with weekly carbo/taxol . Final RT 09/21/23. Patient is currently receiving Folfox (first 1/8). He is under the care of Dr. Lanny.   Met with patient in infusion. Pt mother present for visit. Patient reports doing well overall besides lingering soreness s/p recent fall at home. He has a good appetite and eating all the time. He is planning on eating steamed oysters tonight. Patient denies dysphagia/odynophagia. Patient is drinking 2-3 Ensure Original which he enjoys. He is also drinking a lot of sweet tea. States he waters this down with lots of ice.   Medications: reviewed   Labs: glucose 171, BUN 31, Cr 1.56  Anthropometrics: Wt 172 lb 4.8 oz today increased   12/30 - 170 lb 9.6 oz 11/26 - 166 lb 12.8 oz   Estimated Energy Needs  Kcals: 1975-2300 Protein: 98-115 Fluid: >2L  NUTRITION DIAGNOSIS: Unintentional wt loss - improved    INTERVENTION:  Continue drinking Ensure, suggested switching to Ensure Plus/equivalent for added calories and protein BID - Ensure, CIB, samples + coupons provided  Educated on cold sensitivity side effects of oxaliplatin , offered warm supplement ideas Educated on importance of hydration - pt will work to increase intake of water/decrease sweet tea  Contact information provided     MONITORING, EVALUATION, GOAL: wt trends, intake   NEXT VISIT: Tuesday January 21 during infusion

## 2023-11-23 ENCOUNTER — Other Ambulatory Visit: Payer: Self-pay

## 2023-11-24 ENCOUNTER — Inpatient Hospital Stay: Payer: 59

## 2023-11-24 VITALS — BP 144/74 | HR 95 | Temp 97.8°F | Resp 18

## 2023-11-24 DIAGNOSIS — C155 Malignant neoplasm of lower third of esophagus: Secondary | ICD-10-CM | POA: Diagnosis not present

## 2023-11-24 MED ORDER — HEPARIN SOD (PORK) LOCK FLUSH 100 UNIT/ML IV SOLN
500.0000 [IU] | Freq: Once | INTRAVENOUS | Status: AC | PRN
Start: 2023-11-24 — End: 2023-11-24
  Administered 2023-11-24: 500 [IU]

## 2023-11-24 MED ORDER — SODIUM CHLORIDE 0.9% FLUSH
10.0000 mL | INTRAVENOUS | Status: DC | PRN
  Administered 2023-11-24: 10 mL

## 2023-12-04 NOTE — Assessment & Plan Note (Addendum)
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  We have reviewed his case in the tumor board, and recommend biopsy of the sternal bone lesion.   -He underwent a EUS staging, which showed 2 tumors in the distal esophagus, staged as T1smN0 and T2N0.  -His sternal bone biopsy was negative for malignant cells. -He started concurrent chemotherapy and radiation on 08/15/2023 with weekly carbo and Taxol.  He completed on September 21, 2023 -he started consolidation chemo FOLFOX on 11/22/2023

## 2023-12-05 ENCOUNTER — Inpatient Hospital Stay (HOSPITAL_BASED_OUTPATIENT_CLINIC_OR_DEPARTMENT_OTHER): Payer: 59 | Admitting: Hematology

## 2023-12-05 ENCOUNTER — Inpatient Hospital Stay: Payer: 59

## 2023-12-05 ENCOUNTER — Inpatient Hospital Stay: Payer: 59 | Admitting: Dietician

## 2023-12-05 VITALS — BP 139/94 | HR 96 | Temp 97.9°F | Resp 16 | Wt 170.4 lb

## 2023-12-05 DIAGNOSIS — C189 Malignant neoplasm of colon, unspecified: Secondary | ICD-10-CM

## 2023-12-05 DIAGNOSIS — C155 Malignant neoplasm of lower third of esophagus: Secondary | ICD-10-CM | POA: Diagnosis not present

## 2023-12-05 DIAGNOSIS — Z95828 Presence of other vascular implants and grafts: Secondary | ICD-10-CM

## 2023-12-05 LAB — CBC WITH DIFFERENTIAL (CANCER CENTER ONLY)
Abs Immature Granulocytes: 0.01 10*3/uL (ref 0.00–0.07)
Basophils Absolute: 0 10*3/uL (ref 0.0–0.1)
Basophils Relative: 1 %
Eosinophils Absolute: 0.2 10*3/uL (ref 0.0–0.5)
Eosinophils Relative: 3 %
HCT: 47.1 % (ref 39.0–52.0)
Hemoglobin: 15.9 g/dL (ref 13.0–17.0)
Immature Granulocytes: 0 %
Lymphocytes Relative: 18 %
Lymphs Abs: 0.9 10*3/uL (ref 0.7–4.0)
MCH: 30.9 pg (ref 26.0–34.0)
MCHC: 33.8 g/dL (ref 30.0–36.0)
MCV: 91.6 fL (ref 80.0–100.0)
Monocytes Absolute: 0.6 10*3/uL (ref 0.1–1.0)
Monocytes Relative: 12 %
Neutro Abs: 3.2 10*3/uL (ref 1.7–7.7)
Neutrophils Relative %: 66 %
Platelet Count: 128 10*3/uL — ABNORMAL LOW (ref 150–400)
RBC: 5.14 MIL/uL (ref 4.22–5.81)
RDW: 14.5 % (ref 11.5–15.5)
WBC Count: 4.8 10*3/uL (ref 4.0–10.5)
nRBC: 0 % (ref 0.0–0.2)

## 2023-12-05 LAB — CMP (CANCER CENTER ONLY)
ALT: 28 U/L (ref 0–44)
AST: 27 U/L (ref 15–41)
Albumin: 4.1 g/dL (ref 3.5–5.0)
Alkaline Phosphatase: 139 U/L — ABNORMAL HIGH (ref 38–126)
Anion gap: 8 (ref 5–15)
BUN: 31 mg/dL — ABNORMAL HIGH (ref 8–23)
CO2: 30 mmol/L (ref 22–32)
Calcium: 9.6 mg/dL (ref 8.9–10.3)
Chloride: 98 mmol/L (ref 98–111)
Creatinine: 1.28 mg/dL — ABNORMAL HIGH (ref 0.61–1.24)
GFR, Estimated: >60 mL/min (ref >60)
Glucose, Bld: 157 mg/dL — ABNORMAL HIGH (ref 70–99)
Potassium: 4.1 mmol/L (ref 3.5–5.1)
Sodium: 136 mmol/L (ref 135–145)
Total Bilirubin: 0.4 mg/dL (ref 0.0–1.2)
Total Protein: 7.5 g/dL (ref 6.5–8.1)

## 2023-12-05 MED ORDER — FLUOROURACIL CHEMO INJECTION 2.5 GM/50ML
400.0000 mg/m2 | Freq: Once | INTRAVENOUS | Status: AC
  Administered 2023-12-05: 800 mg via INTRAVENOUS
  Filled 2023-12-05: qty 16

## 2023-12-05 MED ORDER — PALONOSETRON HCL INJECTION 0.25 MG/5ML
0.2500 mg | Freq: Once | INTRAVENOUS | Status: AC
  Administered 2023-12-05: 0.25 mg via INTRAVENOUS
  Filled 2023-12-05: qty 5

## 2023-12-05 MED ORDER — LEUCOVORIN CALCIUM INJECTION 350 MG
400.0000 mg/m2 | Freq: Once | INTRAVENOUS | Status: AC
  Administered 2023-12-05: 780 mg via INTRAVENOUS
  Filled 2023-12-05: qty 25

## 2023-12-05 MED ORDER — SODIUM CHLORIDE 0.9% FLUSH
10.0000 mL | INTRAVENOUS | Status: DC | PRN
Start: 2023-12-05 — End: 2023-12-05

## 2023-12-05 MED ORDER — SODIUM CHLORIDE 0.9% FLUSH
10.0000 mL | Freq: Once | INTRAVENOUS | Status: AC
Start: 2023-12-05 — End: 2023-12-05
  Administered 2023-12-05: 10 mL

## 2023-12-05 MED ORDER — DEXTROSE 5 % IV SOLN: INTRAVENOUS | Status: DC

## 2023-12-05 MED ORDER — HEPARIN SOD (PORK) LOCK FLUSH 100 UNIT/ML IV SOLN
500.0000 [IU] | Freq: Once | INTRAVENOUS | Status: DC | PRN
Start: 2023-12-05 — End: 2023-12-05

## 2023-12-05 MED ORDER — DEXAMETHASONE SODIUM PHOSPHATE 10 MG/ML IJ SOLN
10.0000 mg | Freq: Once | INTRAMUSCULAR | Status: AC
  Administered 2023-12-05: 10 mg via INTRAVENOUS
  Filled 2023-12-05: qty 1

## 2023-12-05 MED ORDER — DEXTROSE 5 % IV SOLN
85.0000 mg/m2 | Freq: Once | INTRAVENOUS | Status: AC
  Administered 2023-12-05: 165 mg via INTRAVENOUS
  Filled 2023-12-05: qty 33

## 2023-12-05 MED ORDER — SODIUM CHLORIDE 0.9 % IV SOLN
2400.0000 mg/m2 | INTRAVENOUS | Status: DC
  Administered 2023-12-05: 5000 mg via INTRAVENOUS
  Filled 2023-12-05: qty 100

## 2023-12-05 NOTE — Patient Instructions (Signed)
[facility]  Discharge Instructions: Thank you for choosing [facility] to provide your oncology and hematology care.   If you have a lab appointment with the Cancer Center, please go directly to the Cancer Center and check in at the registration area.   Wear comfortable clothing and clothing appropriate for easy access to any Portacath or PICC line.   We strive to give you quality time with your provider. You may need to reschedule your appointment if you arrive late (15 or more minutes).  Arriving late affects you and other patients whose appointments are after yours.  Also, if you miss three or more appointments without notifying the office, you may be dismissed from the clinic at the provider's discretion.      For prescription refill requests, have your pharmacy contact our office and allow 72 hours for refills to be completed.    Today you received the following chemotherapy and/or immunotherapy agents oxaliplatin. Leucovorin, fluorourcil      To help prevent nausea and vomiting after your treatment, we encourage you to take your nausea medication as directed.  BELOW ARE SYMPTOMS THAT SHOULD BE REPORTED IMMEDIATELY: *FEVER GREATER THAN 100.4 F (38 C) OR HIGHER *CHILLS OR SWEATING *NAUSEA AND VOMITING THAT IS NOT CONTROLLED WITH YOUR NAUSEA MEDICATION *UNUSUAL SHORTNESS OF BREATH *UNUSUAL BRUISING OR BLEEDING *URINARY PROBLEMS (pain or burning when urinating, or frequent urination) *BOWEL PROBLEMS (unusual diarrhea, constipation, pain near the anus) TENDERNESS IN MOUTH AND THROAT WITH OR WITHOUT PRESENCE OF ULCERS (sore throat, sores in mouth, or a toothache) UNUSUAL RASH, SWELLING OR PAIN  UNUSUAL VAGINAL DISCHARGE OR ITCHING   Items with * indicate a potential emergency and should be followed up as soon as possible or go to the Emergency Department if any problems should occur.  Please show the CHEMOTHERAPY  ALERT CARD or IMMUNOTHERAPY ALERT CARD at check-in to the Emergency Department and triage nurse.  Should you have questions after your visit or need to cancel or reschedule your appointment, please contact [facility] A [facility]  Dept: [phone]  and follow the prompts.  Office hours are 8:00 a.m. to 4:30 p.m. Monday - Friday. Please note that voicemails left after 4:00 p.m. may not be returned until the following business day.  We are closed weekends and major holidays. You have access to a nurse at all times for urgent questions. Please call the main number to the clinic Dept: [phone] and follow the prompts.   For any non-urgent questions, you may also contact your provider using MyChart. We now offer e-Visits for anyone 47 and older to request care online for non-urgent symptoms. For details visit [email or web address].   Also download the MyChart app! Go to the app store, search "MyChart", open the app, select [place], and log in with your MyChart username and password.

## 2023-12-05 NOTE — Progress Notes (Signed)
Nutrition Follow-up:  Patient with esophageal cancer. S/p concurrent chemoradiation with weekly carbo/taxol. Final RT 09/21/23. Patient is currently receiving Folfox (first 1/8). He is under the care of Dr. Mosetta Putt.   Met with patient and mom in infusion. Patient reports tolerating first chemo well. He denies cold sensitivity and eating his third ice cream cup at visit. Patient reports one episode of nausea resolved with compazine. He has a good appetite and eating well. Patient has switched to Ensure Complete as recommended. He is drinking 2 most days. Patient likes these. Patient states he is drinking as much water as he can, but his blood pressure pill makes him pee all the time.    Medications: reviewed  Labs: glucose 157, BUN 31, Cr 1.28  Anthropometrics: Wt 170 lb 6.4 oz today decreased from 172 lb 4.8 oz on 1/8  12/30 - 170 lb 9.6 oz 11/22 - 171 lb 15.3 oz    NUTRITION DIAGNOSIS: Unintentional weight loss - addressing with ONS   INTERVENTION:  Encouraged high calorie high protein foods Continue 2 Ensure Complete - samples + coupons provided     MONITORING, EVALUATION, GOAL: wt trends, intake   NEXT VISIT: To be scheduled with treatment

## 2023-12-05 NOTE — Progress Notes (Signed)
[facility]   Telephone:([phone]) [phone] Fax:([phone]) [phone]   Clinic Follow up Note   Patient Care Team: Storm Frisk, MD as PCP - General (Pulmonary Disease) Donzetta Starch, MD as Consulting Physician (Dermatology) Veryl Speak, FNP as Nurse Practitioner (Infectious Diseases) Forestine Na (Physician Assistant) Bradly Bienenstock, MD as Consulting Physician (Orthopedic Surgery) Malachy Mood, MD as Consulting Physician (Hematology and Oncology)  Date of Service:  12/05/2023  CHIEF COMPLAINT: f/u of esophageal cancer  CURRENT THERAPY:  Consolidation chemotherapy FOLFOX every 2 weeks  Oncology History   Esophageal cancer [facility]) Distal esophageal carcinoma, 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  We have reviewed his case in the tumor board, and recommend biopsy of the sternal bone lesion.   -He underwent a EUS staging, which showed 2 tumors in the distal esophagus, staged as T1smN0 and T2N0.  -His sternal bone biopsy was negative for malignant cells. -He started concurrent chemotherapy and radiation on 08/15/2023 with weekly carbo and Taxol.  He completed on September 21, 2023 -he started consolidation chemo FOLFOX on 11/22/2023   Assessment and Plan    Esophageal Cancer Currently undergoing chemotherapy, second cycle. Initial mild nausea and cold sensitivity resolved. No significant neuropathy. Chest pain when coughing, managed with cough medicine. Blood counts generally good, slight thrombocytopenia due to chemotherapy. No anemia or leukopenia. Plan includes six cycles over three months, followed by a PET scan to assess response. -  Administer second cycle of chemotherapy today - Continue chemotherapy for six cycles over three months - Order PET scan at the end of March or early April to evaluate treatment response - Monitor blood counts regularly - Continue current cough medicine as needed - Ensure adequate hydration  Arm Fracture Fractured arm six weeks ago, initially in a hard cast for four weeks, now using a removable brace for comfort and hygiene. - Continue using the removable brace as needed  Plan -Lab reviewed, adequate for treatment, will proceed second cycle FOLFOX today at the same dose, and continue every 2 weeks -Follow-up in 2 weeks before next cycle chemo        SUMMARY OF ONCOLOGIC HISTORY: Oncology History  Adenocarcinoma of colon (HCC)  01/19/2021 Initial Diagnosis   Adenocarcinoma of colon (HCC)   08/29/2023 Genetic Testing   Negative [facility] Multi-Cancer +RNA Panel.  Report date is 08/29/2023.   The Multi-Cancer + RNA Panel offered by [facility] includes sequencing and/or deletion/duplication analysis of the following 70 genes:  AIP*, ALK, APC*, ATM*, AXIN2*, BAP1*, BARD1*, BLM*, BMPR1A*, BRCA1*, BRCA2*, BRIP1*, CDC73*, CDH1*, CDK4, CDKN1B*, CDKN2A, CHEK2*, CTNNA1*, DICER1*, EPCAM (del/dup only), EGFR, FH*, FLCN*, GREM1 (promoter dup only), HOXB13, KIT, LZTR1, MAX*, MBD4, MEN1*, MET, MITF, MLH1*, MSH2*, MSH3*, MSH6*, MUTYH*, NF1*, NF2*, NTHL1*, PALB2*, PDGFRA, PMS2*, POLD1*, POLE*, POT1*, PRKAR1A*, PTCH1*, PTEN*, RAD51C*, RAD51D*, RB1*, RET, SDHA* (sequencing only), SDHAF2*, SDHB*, SDHC*, SDHD*, SMAD4*, SMARCA4*, SMARCB1*, SMARCE1*, STK11*, SUFU*, TMEM127*, TP53*, TSC1*, TSC2*, VHL*. RNA analysis is performed for * genes.   Esophageal cancer (HCC)  07/13/2023 Initial Diagnosis   Esophageal cancer (HCC)   08/15/2023 - 09/18/2023 Chemotherapy   Patient is on Treatment Plan : ESOPHAGUS Carboplatin + Paclitaxel Weekly X 6 Weeks with XRT  08/29/2023 Genetic Testing   Negative [facility] Multi-Cancer  +RNA Panel.  Report date is 08/29/2023.   The Multi-Cancer + RNA Panel offered by [facility] includes sequencing and/or deletion/duplication analysis of the following 70 genes:  AIP*, ALK, APC*, ATM*, AXIN2*, BAP1*, BARD1*, BLM*, BMPR1A*, BRCA1*, BRCA2*, BRIP1*, CDC73*, CDH1*, CDK4, CDKN1B*, CDKN2A, CHEK2*, CTNNA1*, DICER1*, EPCAM (del/dup only), EGFR, FH*, FLCN*, GREM1 (promoter dup only), HOXB13, KIT, LZTR1, MAX*, MBD4, MEN1*, MET, MITF, MLH1*, MSH2*, MSH3*, MSH6*, MUTYH*, NF1*, NF2*, NTHL1*, PALB2*, PDGFRA, PMS2*, POLD1*, POLE*, POT1*, PRKAR1A*, PTCH1*, PTEN*, RAD51C*, RAD51D*, RB1*, RET, SDHA* (sequencing only), SDHAF2*, SDHB*, SDHC*, SDHD*, SMAD4*, SMARCA4*, SMARCB1*, SMARCE1*, STK11*, SUFU*, TMEM127*, TP53*, TSC1*, TSC2*, VHL*. RNA analysis is performed for * genes.   11/22/2023 -  Chemotherapy   Patient is on Treatment Plan : GASTROESOPHAGEAL FOLFOX q14d x 6 cycles        Discussed the use of AI scribe software for clinical note transcription with the patient, who gave verbal consent to proceed.  History of Present Illness   The patient, a 68 year old with esophageal cancer, presents for a follow-up visit after starting chemotherapy two weeks ago. He tolerated the first cycle well, with only one episode of nausea and transient cold sensitivity. He denies any numbness or tingling. He has been managing a cough with prescribed cough medicine, which has also alleviated associated chest pain. He has no other health concerns he wishes to discuss at this time.  In addition to his cancer treatment, the patient is also dealing with a broken arm, which occurred approximately six weeks ago. He did not undergo surgery and has been allowing the arm to heal naturally. Initially, he wore a hard cast for four weeks, and is now using a removable cast, which he finds more comfortable.         All other systems were reviewed with the patient and are negative.  MEDICAL HISTORY:  Past Medical History:   Diagnosis Date   Allergy    seasonal   Aneurysm of infrarenal abdominal aorta (HCC)    4.2 cm 07/12/23   Arthritis    Colon cancer (HCC)    Compression fracture of thoracic vertebra with routine healing 04/30/2020   COPD (chronic obstructive pulmonary disease) (HCC)    Family history of adverse reaction to anesthesia    mother slow to awaken   GERD (gastroesophageal reflux disease)    Hepatitis    hepatitis c dx in remission per pt   Hypertension    Inguinal hernia    Multiple fractures 06/04/2020   Positive colorectal cancer screening using Cologuard test 08/27/2020   Substance abuse (HCC)    Wears glasses    for reading    SURGICAL HISTORY: Past Surgical History:  Procedure Laterality Date   COLONOSCOPY  11/11/2020   ENDOSCOPIC MUCOSAL RESECTION N/A 03/10/2021   Procedure: ENDOSCOPIC MUCOSAL RESECTION;  Surgeon: Lemar Lofty., MD;  Location: [facility];  Service: Gastroenterology;  Laterality: N/A;   ESOPHAGOGASTRODUODENOSCOPY (EGD) WITH PROPOFOL N/A 07/27/2023   Procedure: ESOPHAGOGASTRODUODENOSCOPY (EGD) WITH PROPOFOL;  Surgeon: Meridee Score Netty Starring., MD;  Location: [facility];  Service: Gastroenterology;  Laterality: N/A;   EUS N/A 07/27/2023   Procedure: UPPER ENDOSCOPIC ULTRASOUND (EUS) RADIAL;  Surgeon: Lemar Lofty., MD;  Location: [facility];  Service: Gastroenterology;  Laterality: N/A;   FLEXIBLE SIGMOIDOSCOPY N/A 03/10/2021   Procedure: FLEXIBLE SIGMOIDOSCOPY;  Surgeon: Meridee Score Netty Starring., MD;  Location: [facility];  Service: Gastroenterology;  Laterality: N/A;   HEMOSTASIS CLIP PLACEMENT  03/10/2021  Procedure: HEMOSTASIS CLIP PLACEMENT;  Surgeon: Lemar Lofty., MD;  Location: [facility];  Service: Gastroenterology;;   INGUINAL HERNIA REPAIR Right 01/19/2021   Procedure: LAPAROSCOPIC RIGHT INGUINAL HERNIA REPAIR WITH MESH;  Surgeon: Stechschulte, Hyman Hopes, MD;  Location: [facility];  Service: General;   Laterality: Right;   IR IMAGING GUIDED PORT INSERTION  08/17/2023   obstruction removed as a child   swallowed a peanut   POLYPECTOMY  03/10/2021   Procedure: POLYPECTOMY;  Surgeon: Mansouraty, Netty Starring., MD;  Location: [facility];  Service: Gastroenterology;;   POLYPECTOMY     SUBMUCOSAL LIFTING INJECTION  03/10/2021   Procedure: SUBMUCOSAL LIFTING INJECTION;  Surgeon: Lemar Lofty., MD;  Location: [facility];  Service: Gastroenterology;;    I have reviewed the social history and family history with the patient and they are unchanged from previous note.  ALLERGIES:  has no known allergies.  MEDICATIONS:  Current Outpatient Medications  Medication Sig Dispense Refill   albuterol (VENTOLIN HFA) 108 (90 Base) MCG/ACT inhaler Inhale 2 puffs into the lungs every 6 (six) hours as needed for wheezing or shortness of breath. 18 g 2   amLODipine (NORVASC) 10 MG tablet Take 1 tablet (10 mg total) by mouth daily. 90 tablet 2   chlorthalidone (HYGROTON) 25 MG tablet Take 1 tablet (25 mg total) by mouth daily. 90 tablet 1   DELSYM 30 MG/5ML liquid Take 30 mg by mouth 2 (two) times daily as needed for cough. (Patient not taking: Reported on 11/13/2023)     dexamethasone (DECADRON) 4 MG tablet Take 2 tablets (8 mg total) by mouth daily. Start the day after chemotherapy for 2 days. Take with food. 30 tablet 1   fluticasone-salmeterol (ADVAIR HFA) 115-21 MCG/ACT inhaler Inhale 2 puffs into the lungs 2 (two) times daily. 12 g 12   guaiFENesin-codeine 100-10 MG/5ML syrup Take 5 mLs by mouth every 6 (six) hours as needed for cough. 473 mL 0   lidocaine-prilocaine (EMLA) cream Apply 1 Application topically as needed. (Patient taking differently: Apply 1 Application topically as needed (for port access).) 30 g 0   lidocaine-prilocaine (EMLA) cream Apply to affected area once 30 g 3   Multiple Vitamins-Minerals (CENTRUM SILVER 50+MEN) TABS Take 1 tablet by mouth daily with breakfast.      omeprazole (PRILOSEC) 40 MG capsule Take 1 capsule (40 mg total) by mouth daily before dinner. 60 capsule 3   ondansetron (ZOFRAN) 8 MG tablet Take 1 tablet (8 mg total) by mouth every 8 (eight) hours as needed for nausea or vomiting. Start on the third day after chemotherapy. 30 tablet 1   oxyCODONE (OXY IR/ROXICODONE) 5 MG immediate release tablet Take 1 tablet (5 mg total) by mouth every 8 (eight) hours as needed for pain. (Patient not taking: Reported on 11/22/2023) 8 tablet 0   oxyCODONE-acetaminophen (PERCOCET/ROXICET) 5-325 MG tablet Take 1 tablet by mouth every 6 (six) hours as needed for severe pain (pain score 7-10). (Patient not taking: Reported on 11/13/2023) 10 tablet 0   prochlorperazine (COMPAZINE) 10 MG tablet Take 1 tablet (10 mg total) by mouth every 6 (six) hours as needed for nausea or vomiting. 30 tablet 1   sildenafil (VIAGRA) 100 MG tablet Take 1 tablet (100 mg total) by mouth daily as needed for erectile dysfunction. 10 tablet 3   sucralfate (CARAFATE) 1 g tablet Take 1 tablet (1 g total) by mouth 4 (four) times daily -  with meals and at bedtime. 60 tablet 1  No current facility-administered medications for this visit.    PHYSICAL EXAMINATION: ECOG PERFORMANCE STATUS: 1 - Symptomatic but completely ambulatory  Vitals:   12/05/23 0919  BP: (!) 139/94  Pulse: 96  Resp: 16  Temp: 97.9 F (36.6 C)  SpO2: 98%   Wt Readings from Last 3 Encounters:  12/05/23 170 lb 6.4 oz (77.3 kg)  11/22/23 172 lb 4.8 oz (78.2 kg)  11/13/23 170 lb 9.6 oz (77.4 kg)     GENERAL:alert, no distress and comfortable SKIN: skin color, texture, turgor are normal, no rashes or significant lesions EYES: normal, Conjunctiva are pink and non-injected, sclera clear NECK: supple, thyroid normal size, non-tender, without nodularity LYMPH:  no palpable lymphadenopathy in the cervical, axillary  LUNGS: clear to auscultation and percussion with normal breathing effort HEART: regular rate & rhythm  and no murmurs and no lower extremity edema ABDOMEN:abdomen soft, non-tender and normal bowel sounds Musculoskeletal:no cyanosis of digits and no clubbing  NEURO: alert & oriented x 3 with fluent speech, no focal motor/sensory deficits   LABORATORY DATA:  I have reviewed the data as listed    Latest Ref Rng & Units 12/05/2023    8:57 AM 11/22/2023   10:42 AM 10/10/2023    2:36 PM  CBC  WBC 4.0 - 10.5 K/uL 4.8  7.2  10.7   Hemoglobin 13.0 - 17.0 g/dL 16.1  09.6  04.5   Hematocrit 39.0 - 52.0 % 47.1  44.8  35.7   Platelets 150 - 400 K/uL 128  193  359         Latest Ref Rng & Units 12/05/2023    8:57 AM 11/22/2023   10:42 AM 10/10/2023    2:36 PM  CMP  Glucose 70 - 99 mg/dL 409  811  914   BUN 8 - 23 mg/dL 31  31  30    Creatinine 0.61 - 1.24 mg/dL 7.82  9.56  2.13   Sodium 135 - 145 mmol/L 136  136  136   Potassium 3.5 - 5.1 mmol/L 4.1  4.0  4.0   Chloride 98 - 111 mmol/L 98  100  101   CO2 22 - 32 mmol/L 30  27  27    Calcium 8.9 - 10.3 mg/dL 9.6  9.6  9.4   Total Protein 6.5 - 8.1 g/dL 7.5  7.6  7.4   Total Bilirubin 0.0 - 1.2 mg/dL 0.4  0.4  0.4   Alkaline Phos 38 - 126 U/L 139  151  127   AST 15 - 41 U/L 27  31  30    ALT 0 - 44 U/L 28  35  47       RADIOGRAPHIC STUDIES: I have personally reviewed the radiological images as listed and agreed with the findings in the report. No results found.    Orders Placed This Encounter  Procedures   CBC with Differential (Cancer Center Only)    Standing Status:   Future    Expected Date:   01/03/2024    Expiration Date:   01/02/2025   CMP (Cancer Center only)    Standing Status:   Future    Expected Date:   01/03/2024    Expiration Date:   01/02/2025   CBC with Differential (Cancer Center Only)    Standing Status:   Future    Expected Date:   01/17/2024    Expiration Date:   01/16/2025   CMP (Cancer Center only)    Standing Status:   Future  Expected Date:   01/17/2024    Expiration Date:   01/16/2025   CBC with Differential  (Cancer Center Only)    Standing Status:   Future    Expected Date:   01/31/2024    Expiration Date:   01/30/2025   CMP (Cancer Center only)    Standing Status:   Future    Expected Date:   01/31/2024    Expiration Date:   01/30/2025   All questions were answered. The patient knows to call the clinic with any problems, questions or concerns. No barriers to learning was detected. The total time spent in the appointment was 25 minutes.     Malachy Mood, MD 12/05/2023

## 2023-12-07 ENCOUNTER — Inpatient Hospital Stay: Payer: 59

## 2023-12-07 VITALS — BP 145/83 | HR 90 | Temp 98.3°F | Resp 16

## 2023-12-07 DIAGNOSIS — C155 Malignant neoplasm of lower third of esophagus: Secondary | ICD-10-CM

## 2023-12-07 MED ORDER — HEPARIN SOD (PORK) LOCK FLUSH 100 UNIT/ML IV SOLN
500.0000 [IU] | Freq: Once | INTRAVENOUS | Status: AC | PRN
  Administered 2023-12-07: 500 [IU]

## 2023-12-07 MED ORDER — SODIUM CHLORIDE 0.9% FLUSH
10.0000 mL | INTRAVENOUS | Status: DC | PRN
Start: 2023-12-07 — End: 2023-12-07
  Administered 2023-12-07: 10 mL

## 2023-12-12 ENCOUNTER — Inpatient Hospital Stay: Payer: 59 | Admitting: Licensed Clinical Social Worker

## 2023-12-12 DIAGNOSIS — C155 Malignant neoplasm of lower third of esophagus: Secondary | ICD-10-CM

## 2023-12-12 NOTE — Progress Notes (Signed)
[facility] CSW Progress Note  Visual merchandiser  received a call from pt inquiring about financial assistance.  Pt is retired and receives [employer].  Pt states he was receiving food stamps, but the last time he re-applied he was being awarded only $20 so he let is lapse.  CSW instructed pt to re-apply for food stamps and once awarded he will be eligible to apply for the [employer].  Pt provide w/ Joi's number at the DSS outstation as well as the financial resource specialist's contact information.  Per pt his brother and sister-in-law are still waiting for a visa to be approved in [place].  CSW to continue to provide assistance as appropriate throughout duration of treatment.        Rachel Moulds, LCSW Clinical Social Worker [facility]

## 2023-12-17 NOTE — Assessment & Plan Note (Signed)
 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  We have reviewed his case in the tumor board, and recommend biopsy of the sternal bone lesion.   -He underwent a EUS staging, which showed 2 tumors in the distal esophagus, staged as T1smN0 and T2N0.  -His sternal bone biopsy was negative for malignant cells. -He started concurrent chemotherapy and radiation on 08/15/2023 with weekly carbo and Taxol.  He completed on September 21, 2023 -he started consolidation chemo FOLFOX on 11/22/2023

## 2023-12-18 ENCOUNTER — Inpatient Hospital Stay: Payer: 59 | Attending: Hematology

## 2023-12-18 ENCOUNTER — Encounter: Payer: Self-pay | Admitting: Hematology

## 2023-12-18 ENCOUNTER — Inpatient Hospital Stay: Payer: 59

## 2023-12-18 ENCOUNTER — Inpatient Hospital Stay (HOSPITAL_BASED_OUTPATIENT_CLINIC_OR_DEPARTMENT_OTHER): Payer: 59 | Admitting: Hematology

## 2023-12-18 ENCOUNTER — Other Ambulatory Visit: Payer: Self-pay

## 2023-12-18 VITALS — BP 131/80 | HR 88 | Temp 98.5°F | Resp 16 | Wt 169.8 lb

## 2023-12-18 DIAGNOSIS — J069 Acute upper respiratory infection, unspecified: Secondary | ICD-10-CM | POA: Insufficient documentation

## 2023-12-18 DIAGNOSIS — D696 Thrombocytopenia, unspecified: Secondary | ICD-10-CM | POA: Insufficient documentation

## 2023-12-18 DIAGNOSIS — C155 Malignant neoplasm of lower third of esophagus: Secondary | ICD-10-CM

## 2023-12-18 DIAGNOSIS — Z5111 Encounter for antineoplastic chemotherapy: Secondary | ICD-10-CM | POA: Insufficient documentation

## 2023-12-18 DIAGNOSIS — I10 Essential (primary) hypertension: Secondary | ICD-10-CM | POA: Diagnosis not present

## 2023-12-18 DIAGNOSIS — Z7952 Long term (current) use of systemic steroids: Secondary | ICD-10-CM | POA: Diagnosis not present

## 2023-12-18 DIAGNOSIS — K219 Gastro-esophageal reflux disease without esophagitis: Secondary | ICD-10-CM | POA: Diagnosis not present

## 2023-12-18 DIAGNOSIS — C189 Malignant neoplasm of colon, unspecified: Secondary | ICD-10-CM

## 2023-12-18 DIAGNOSIS — D72819 Decreased white blood cell count, unspecified: Secondary | ICD-10-CM | POA: Insufficient documentation

## 2023-12-18 DIAGNOSIS — K5900 Constipation, unspecified: Secondary | ICD-10-CM | POA: Insufficient documentation

## 2023-12-18 DIAGNOSIS — Z7951 Long term (current) use of inhaled steroids: Secondary | ICD-10-CM | POA: Diagnosis not present

## 2023-12-18 DIAGNOSIS — K449 Diaphragmatic hernia without obstruction or gangrene: Secondary | ICD-10-CM | POA: Diagnosis not present

## 2023-12-18 DIAGNOSIS — Z95828 Presence of other vascular implants and grafts: Secondary | ICD-10-CM

## 2023-12-18 DIAGNOSIS — Z79899 Other long term (current) drug therapy: Secondary | ICD-10-CM | POA: Insufficient documentation

## 2023-12-18 DIAGNOSIS — R040 Epistaxis: Secondary | ICD-10-CM | POA: Diagnosis not present

## 2023-12-18 DIAGNOSIS — J449 Chronic obstructive pulmonary disease, unspecified: Secondary | ICD-10-CM | POA: Insufficient documentation

## 2023-12-18 LAB — CMP (CANCER CENTER ONLY)
ALT: 35 U/L (ref 0–44)
AST: 33 U/L (ref 15–41)
Albumin: 3.9 g/dL (ref 3.5–5.0)
Alkaline Phosphatase: 139 U/L — ABNORMAL HIGH (ref 38–126)
Anion gap: 8 (ref 5–15)
BUN: 36 mg/dL — ABNORMAL HIGH (ref 8–23)
CO2: 31 mmol/L (ref 22–32)
Calcium: 9.2 mg/dL (ref 8.9–10.3)
Chloride: 97 mmol/L — ABNORMAL LOW (ref 98–111)
Creatinine: 1.31 mg/dL — ABNORMAL HIGH (ref 0.61–1.24)
GFR, Estimated: 59 mL/min — ABNORMAL LOW (ref >60)
Glucose, Bld: 123 mg/dL — ABNORMAL HIGH (ref 70–99)
Potassium: 3.4 mmol/L — ABNORMAL LOW (ref 3.5–5.1)
Sodium: 136 mmol/L (ref 135–145)
Total Bilirubin: 0.4 mg/dL (ref 0.0–1.2)
Total Protein: 7.2 g/dL (ref 6.5–8.1)

## 2023-12-18 LAB — CBC WITH DIFFERENTIAL (CANCER CENTER ONLY)
Abs Immature Granulocytes: 0.01 10*3/uL (ref 0.00–0.07)
Basophils Absolute: 0 10*3/uL (ref 0.0–0.1)
Basophils Relative: 1 %
Eosinophils Absolute: 0.1 10*3/uL (ref 0.0–0.5)
Eosinophils Relative: 2 %
HCT: 42.6 % (ref 39.0–52.0)
Hemoglobin: 14.9 g/dL (ref 13.0–17.0)
Immature Granulocytes: 0 %
Lymphocytes Relative: 20 %
Lymphs Abs: 0.6 10*3/uL — ABNORMAL LOW (ref 0.7–4.0)
MCH: 30.8 pg (ref 26.0–34.0)
MCHC: 35 g/dL (ref 30.0–36.0)
MCV: 88 fL (ref 80.0–100.0)
Monocytes Absolute: 0.7 10*3/uL (ref 0.1–1.0)
Monocytes Relative: 21 %
Neutro Abs: 1.8 10*3/uL (ref 1.7–7.7)
Neutrophils Relative %: 56 %
Platelet Count: 102 10*3/uL — ABNORMAL LOW (ref 150–400)
RBC: 4.84 MIL/uL (ref 4.22–5.81)
RDW: 14.1 % (ref 11.5–15.5)
WBC Count: 3.3 10*3/uL — ABNORMAL LOW (ref 4.0–10.5)
nRBC: 0 % (ref 0.0–0.2)

## 2023-12-18 MED ORDER — SODIUM CHLORIDE 0.9% FLUSH
10.0000 mL | Freq: Once | INTRAVENOUS | Status: AC
Start: 2023-12-18 — End: 2023-12-18
  Administered 2023-12-18: 10 mL

## 2023-12-18 MED ORDER — DEXTROSE 5 % IV SOLN
400.0000 mg/m2 | Freq: Once | INTRAVENOUS | Status: AC
  Administered 2023-12-18: 780 mg via INTRAVENOUS
  Filled 2023-12-18: qty 17.5

## 2023-12-18 MED ORDER — PALONOSETRON HCL INJECTION 0.25 MG/5ML
0.2500 mg | Freq: Once | INTRAVENOUS | Status: AC
  Administered 2023-12-18: 0.25 mg via INTRAVENOUS
  Filled 2023-12-18: qty 5

## 2023-12-18 MED ORDER — SODIUM CHLORIDE 0.9 % IV SOLN
2400.0000 mg/m2 | INTRAVENOUS | Status: DC
  Administered 2023-12-18: 5000 mg via INTRAVENOUS
  Filled 2023-12-18: qty 100

## 2023-12-18 MED ORDER — OXALIPLATIN CHEMO INJECTION 100 MG/20ML
85.0000 mg/m2 | Freq: Once | INTRAVENOUS | Status: AC
  Administered 2023-12-18: 165 mg via INTRAVENOUS
  Filled 2023-12-18: qty 33

## 2023-12-18 MED ORDER — DEXTROSE 5 % IV SOLN
INTRAVENOUS | Status: DC
Start: 2023-12-18 — End: 2023-12-18

## 2023-12-18 MED ORDER — DEXAMETHASONE SODIUM PHOSPHATE 10 MG/ML IJ SOLN
10.0000 mg | Freq: Once | INTRAMUSCULAR | Status: AC
  Administered 2023-12-18: 10 mg via INTRAVENOUS
  Filled 2023-12-18: qty 1

## 2023-12-18 MED ORDER — AMOXICILLIN-POT CLAVULANATE 875-125 MG PO TABS
1.0000 | ORAL_TABLET | Freq: Two times a day (BID) | ORAL | 0 refills | Status: DC
  Filled 2023-12-18: qty 14, 7d supply, fill #0

## 2023-12-18 NOTE — Progress Notes (Signed)
[facility]   Telephone:([phone]) [phone] Fax:([phone]) [phone]   Clinic Follow up Note   Patient Care Team: Storm Frisk, MD as PCP - General (Pulmonary Disease) Donzetta Starch, MD as Consulting Physician (Dermatology) Veryl Speak, FNP as Nurse Practitioner (Infectious Diseases) Forestine Na (Physician Assistant) Bradly Bienenstock, MD as Consulting Physician (Orthopedic Surgery) Malachy Mood, MD as Consulting Physician (Hematology and Oncology)  Date of Service:  12/18/2023  CHIEF COMPLAINT: f/u of esophageal cancer  CURRENT THERAPY:  Consolidation chemotherapy FOLFOX every 2 weeks  Oncology History   Esophageal cancer [facility]) Distal esophageal carcinoma, 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  We have reviewed his case in the tumor board, and recommend biopsy of the sternal bone lesion.   -He underwent a EUS staging, which showed 2 tumors in the distal esophagus, staged as T1smN0 and T2N0.  -His sternal bone biopsy was negative for malignant cells. -He started concurrent chemotherapy and radiation on 08/15/2023 with weekly carbo and Taxol.  He completed on September 21, 2023 -he started consolidation chemo FOLFOX on 11/22/2023   Assessment and Plan    Esophageal Cancer Currently undergoing chemotherapy with side effects including fatigue, balance issues, and cold sensitivity. Blood counts show mild thrombocytopenia and leukopenia, within acceptable limits for continuing treatment. Patient prefers to stay on schedule with chemotherapy despite side effects. Discussed reducing chemotherapy dose slightly due to low platelet count and  fatigue. Advised on managing cold sensitivity by avoiding cold drinks and warming liquids before consumption. - Reduce chemotherapy dose slightly - Continue chemotherapy as scheduled - Encourage physical activity to prevent deconditioning - Monitor blood counts regularly  Fatigue Significant fatigue likely secondary to intensive chemotherapy, with difficulty standing and balance issues. Discussed the cumulative nature of chemotherapy side effects and the importance of maintaining physical activity to mitigate fatigue. - Encourage regular physical activity - Monitor for falls and balance issues  Epistaxis Intermittent epistaxis likely due to blood thinner and chemotherapy. Advised to avoid blowing nose to prevent aggravation. Informed about the potential for both chemotherapy and blood thinners to cause bleeding. - Advise against blowing nose - Monitor for other signs of bleeding  Constipation Mild constipation likely due to chemotherapy and anti-nausea medications. Currently manageable with over-the-counter laxatives. Advised to use Miralax if no bowel movement in 2-3 days. - Use Miralax if no bowel movement in 2-3 days - Monitor bowel movements regularly  URI New onset cough with phlegm, possibly due to a respiratory infection. No fever or significant systemic symptoms. Discussed the use of Augmentin to treat potential infection and advised on hydration and rest. - Prescribe Augmentin 875 mg twice daily for 7 days - Monitor respiratory symptoms - Encourage hydration and rest  General Health Maintenance Generally active, helps others with errands, and maintains a social support network. Encouraged to stay active to maintain physical health. - Encourage regular physical activity - Monitor for any new symptoms or changes in health status  Plan -Lab reviewed, platelet 102, will proceed to chemotherapy and I removed 5-FU bolus - Follow up in two weeks before next cycle chemo - Print  out appointment schedule at the end of the visit.  SUMMARY OF ONCOLOGIC HISTORY: Oncology History  Adenocarcinoma of colon (HCC)  01/19/2021 Initial Diagnosis   Adenocarcinoma of colon (HCC)   08/29/2023 Genetic Testing   Negative [facility] Multi-Cancer +RNA Panel.  Report date is 08/29/2023.   The Multi-Cancer + RNA Panel offered by [facility] includes sequencing and/or deletion/duplication analysis of the following 70 genes:  AIP*, ALK, APC*, ATM*, AXIN2*, BAP1*, BARD1*, BLM*, BMPR1A*, BRCA1*, BRCA2*, BRIP1*, CDC73*, CDH1*, CDK4, CDKN1B*, CDKN2A, CHEK2*, CTNNA1*, DICER1*, EPCAM (del/dup only), EGFR, FH*, FLCN*, GREM1 (promoter dup only), HOXB13, KIT, LZTR1, MAX*, MBD4, MEN1*, MET, MITF, MLH1*, MSH2*, MSH3*, MSH6*, MUTYH*, NF1*, NF2*, NTHL1*, PALB2*, PDGFRA, PMS2*, POLD1*, POLE*, POT1*, PRKAR1A*, PTCH1*, PTEN*, RAD51C*, RAD51D*, RB1*, RET, SDHA* (sequencing only), SDHAF2*, SDHB*, SDHC*, SDHD*, SMAD4*, SMARCA4*, SMARCB1*, SMARCE1*, STK11*, SUFU*, TMEM127*, TP53*, TSC1*, TSC2*, VHL*. RNA analysis is performed for * genes.   Esophageal cancer (HCC)  07/13/2023 Initial Diagnosis   Esophageal cancer (HCC)   08/15/2023 - 09/18/2023 Chemotherapy   Patient is on Treatment Plan : ESOPHAGUS Carboplatin + Paclitaxel Weekly X 6 Weeks with XRT     08/29/2023 Genetic Testing   Negative [facility] Multi-Cancer +RNA Panel.  Report date is 08/29/2023.   The Multi-Cancer + RNA Panel offered by [facility] includes sequencing and/or deletion/duplication analysis of the following 70 genes:  AIP*, ALK, APC*, ATM*, AXIN2*, BAP1*, BARD1*, BLM*, BMPR1A*, BRCA1*, BRCA2*, BRIP1*, CDC73*, CDH1*, CDK4, CDKN1B*, CDKN2A, CHEK2*, CTNNA1*, DICER1*, EPCAM (del/dup only), EGFR, FH*, FLCN*, GREM1 (promoter dup only), HOXB13, KIT, LZTR1, MAX*, MBD4, MEN1*, MET, MITF, MLH1*, MSH2*, MSH3*, MSH6*, MUTYH*, NF1*, NF2*, NTHL1*, PALB2*, PDGFRA, PMS2*, POLD1*, POLE*, POT1*, PRKAR1A*, PTCH1*, PTEN*, RAD51C*, RAD51D*, RB1*, RET, SDHA*  (sequencing only), SDHAF2*, SDHB*, SDHC*, SDHD*, SMAD4*, SMARCA4*, SMARCB1*, SMARCE1*, STK11*, SUFU*, TMEM127*, TP53*, TSC1*, TSC2*, VHL*. RNA analysis is performed for * genes.   11/22/2023 -  Chemotherapy   Patient is on Treatment Plan : GASTROESOPHAGEAL FOLFOX q14d x 6 cycles        Discussed the use of AI scribe software for clinical note transcription with the patient, who gave verbal consent to proceed.  History of Present Illness   The patient, an 68 year old with a history of esophageal cancer, presents for a follow-up visit. He reports persistent pain in his hand due to an injury that is healing but still causing discomfort. The patient also mentions experiencing nosebleeds, which he attributes to the blood thinner he is taking. He has noticed an increase in fatigue, making it difficult to stand up from a chair and affecting his balance. Despite these symptoms, the patient remains active, running errands for others and driving.  The patient also reports a cough with phlegm, which is a new symptom for him. He describes it as a congestion that he initially thought was a cold. He also mentions experiencing some constipation, but it is manageable. The patient has been experiencing some body aches that move around, one day affecting his foot and another day his arm.         All other systems were reviewed with the patient and are negative.  MEDICAL HISTORY:  Past Medical History:  Diagnosis Date   Allergy    seasonal   Aneurysm of infrarenal abdominal aorta (HCC)    4.2 cm 07/12/23   Arthritis    Colon cancer (HCC)    Compression fracture of thoracic vertebra with routine healing 04/30/2020   COPD (chronic obstructive pulmonary disease) (HCC)    Family history of adverse reaction to anesthesia    mother slow to awaken  GERD (gastroesophageal reflux disease)    Hepatitis    hepatitis c dx in remission per pt   Hypertension    Inguinal hernia    Multiple fractures 06/04/2020    Positive colorectal cancer screening using Cologuard test 08/27/2020   Substance abuse (HCC)    Wears glasses    for reading    SURGICAL HISTORY: Past Surgical History:  Procedure Laterality Date   COLONOSCOPY  11/11/2020   ENDOSCOPIC MUCOSAL RESECTION N/A 03/10/2021   Procedure: ENDOSCOPIC MUCOSAL RESECTION;  Surgeon: Lemar Lofty., MD;  Location: [facility];  Service: Gastroenterology;  Laterality: N/A;   ESOPHAGOGASTRODUODENOSCOPY (EGD) WITH PROPOFOL N/A 07/27/2023   Procedure: ESOPHAGOGASTRODUODENOSCOPY (EGD) WITH PROPOFOL;  Surgeon: Meridee Score Netty Starring., MD;  Location: [facility];  Service: Gastroenterology;  Laterality: N/A;   EUS N/A 07/27/2023   Procedure: UPPER ENDOSCOPIC ULTRASOUND (EUS) RADIAL;  Surgeon: Lemar Lofty., MD;  Location: [facility];  Service: Gastroenterology;  Laterality: N/A;   FLEXIBLE SIGMOIDOSCOPY N/A 03/10/2021   Procedure: FLEXIBLE SIGMOIDOSCOPY;  Surgeon: Meridee Score Netty Starring., MD;  Location: [facility];  Service: Gastroenterology;  Laterality: N/A;   HEMOSTASIS CLIP PLACEMENT  03/10/2021   Procedure: HEMOSTASIS CLIP PLACEMENT;  Surgeon: Lemar Lofty., MD;  Location: [facility];  Service: Gastroenterology;;   INGUINAL HERNIA REPAIR Right 01/19/2021   Procedure: LAPAROSCOPIC RIGHT INGUINAL HERNIA REPAIR WITH MESH;  Surgeon: Stechschulte, Hyman Hopes, MD;  Location: [facility] [place];  Service: General;  Laterality: Right;   IR IMAGING GUIDED PORT INSERTION  08/17/2023   obstruction removed as a child   swallowed a peanut   POLYPECTOMY  03/10/2021   Procedure: POLYPECTOMY;  Surgeon: Mansouraty, Netty Starring., MD;  Location: [facility];  Service: Gastroenterology;;   POLYPECTOMY     SUBMUCOSAL LIFTING INJECTION  03/10/2021   Procedure: SUBMUCOSAL LIFTING INJECTION;  Surgeon: Lemar Lofty., MD;  Location: [facility];  Service: Gastroenterology;;    I have reviewed the social history and family history  with the patient and they are unchanged from previous note.  ALLERGIES:  has no known allergies.  MEDICATIONS:  Current Outpatient Medications  Medication Sig Dispense Refill   amoxicillin-clavulanate (AUGMENTIN) 875-125 MG tablet Take 1 tablet by mouth 2 (two) times daily. 14 tablet 0   albuterol (VENTOLIN HFA) 108 (90 Base) MCG/ACT inhaler Inhale 2 puffs into the lungs every 6 (six) hours as needed for wheezing or shortness of breath. 18 g 2   amLODipine (NORVASC) 10 MG tablet Take 1 tablet (10 mg total) by mouth daily. 90 tablet 2   chlorthalidone (HYGROTON) 25 MG tablet Take 1 tablet (25 mg total) by mouth daily. 90 tablet 1   DELSYM 30 MG/5ML liquid Take 30 mg by mouth 2 (two) times daily as needed for cough. (Patient not taking: Reported on 11/13/2023)     dexamethasone (DECADRON) 4 MG tablet Take 2 tablets (8 mg total) by mouth daily. Start the day after chemotherapy for 2 days. Take with food. 30 tablet 1   fluticasone-salmeterol (ADVAIR HFA) 115-21 MCG/ACT inhaler Inhale 2 puffs into the lungs 2 (two) times daily. 12 g 12   guaiFENesin-codeine 100-10 MG/5ML syrup Take 5 mLs by mouth every 6 (six) hours as needed for cough. 473 mL 0   lidocaine-prilocaine (EMLA) cream Apply 1 Application topically as needed. (Patient taking differently: Apply 1 Application topically as needed (for port access).) 30 g 0   lidocaine-prilocaine (EMLA) cream Apply to affected area once 30 g 3   Multiple Vitamins-Minerals (CENTRUM SILVER  50+MEN) TABS Take 1 tablet by mouth daily with breakfast.     omeprazole (PRILOSEC) 40 MG capsule Take 1 capsule (40 mg total) by mouth daily before dinner. 60 capsule 3   ondansetron (ZOFRAN) 8 MG tablet Take 1 tablet (8 mg total) by mouth every 8 (eight) hours as needed for nausea or vomiting. Start on the third day after chemotherapy. 30 tablet 1   oxyCODONE (OXY IR/ROXICODONE) 5 MG immediate release tablet Take 1 tablet (5 mg total) by mouth every 8 (eight) hours as needed  for pain. (Patient not taking: Reported on 11/22/2023) 8 tablet 0   oxyCODONE-acetaminophen (PERCOCET/ROXICET) 5-325 MG tablet Take 1 tablet by mouth every 6 (six) hours as needed for severe pain (pain score 7-10). (Patient not taking: Reported on 11/13/2023) 10 tablet 0   prochlorperazine (COMPAZINE) 10 MG tablet Take 1 tablet (10 mg total) by mouth every 6 (six) hours as needed for nausea or vomiting. 30 tablet 1   sildenafil (VIAGRA) 100 MG tablet Take 1 tablet (100 mg total) by mouth daily as needed for erectile dysfunction. 10 tablet 3   sucralfate (CARAFATE) 1 g tablet Take 1 tablet (1 g total) by mouth 4 (four) times daily -  with meals and at bedtime. 60 tablet 1   No current facility-administered medications for this visit.    PHYSICAL EXAMINATION: ECOG PERFORMANCE STATUS: 1 - Symptomatic but completely ambulatory  Vitals:   12/18/23 1123 12/18/23 1124  BP: (!) 147/72 131/80  Pulse: 88   Resp: 16   Temp: 98.5 F (36.9 C)   SpO2: 97%    Wt Readings from Last 3 Encounters:  12/18/23 169 lb 12.8 oz (77 kg)  12/05/23 170 lb 6.4 oz (77.3 kg)  11/22/23 172 lb 4.8 oz (78.2 kg)     GENERAL:alert, no distress and comfortable SKIN: skin color, texture, turgor are normal, no rashes or significant lesions EYES: normal, Conjunctiva are pink and non-injected, sclera clear NECK: supple, thyroid normal size, non-tender, without nodularity LYMPH:  no palpable lymphadenopathy in the cervical, axillary  LUNGS: clear to auscultation and percussion with normal breathing effort with scattered rhonchi in both side HEART: regular rate & rhythm and no murmurs and no lower extremity edema ABDOMEN:abdomen soft, non-tender and normal bowel sounds Musculoskeletal:no cyanosis of digits and no clubbing  NEURO: alert & oriented x 3 with fluent speech, no focal motor/sensory deficits   LABORATORY DATA:  I have reviewed the data as listed    Latest Ref Rng & Units 12/18/2023   10:57 AM 12/05/2023     8:57 AM 11/22/2023   10:42 AM  CBC  WBC 4.0 - 10.5 K/uL 3.3  4.8  7.2   Hemoglobin 13.0 - 17.0 g/dL 16.1  09.6  04.5   Hematocrit 39.0 - 52.0 % 42.6  47.1  44.8   Platelets 150 - 400 K/uL 102  128  193         Latest Ref Rng & Units 12/18/2023   10:57 AM 12/05/2023    8:57 AM 11/22/2023   10:42 AM  CMP  Glucose 70 - 99 mg/dL 409  811  914   BUN 8 - 23 mg/dL 36  31  31   Creatinine 0.61 - 1.24 mg/dL 7.82  9.56  2.13   Sodium 135 - 145 mmol/L 136  136  136   Potassium 3.5 - 5.1 mmol/L 3.4  4.1  4.0   Chloride 98 - 111 mmol/L 97  98  100  CO2 22 - 32 mmol/L 31  30  27    Calcium 8.9 - 10.3 mg/dL 9.2  9.6  9.6   Total Protein 6.5 - 8.1 g/dL 7.2  7.5  7.6   Total Bilirubin 0.0 - 1.2 mg/dL 0.4  0.4  0.4   Alkaline Phos 38 - 126 U/L 139  139  151   AST 15 - 41 U/L 33  27  31   ALT 0 - 44 U/L 35  28  35       RADIOGRAPHIC STUDIES: I have personally reviewed the radiological images as listed and agreed with the findings in the report. No results found.    No orders of the defined types were placed in this encounter.  All questions were answered. The patient knows to call the clinic with any problems, questions or concerns. No barriers to learning was detected. The total time spent in the appointment was 25 minutes.     Malachy Mood, MD 12/18/2023

## 2023-12-18 NOTE — Progress Notes (Addendum)
We will omit 5-FU push and not reduce chemo dose this cycle per Dr. Mosetta Putt.  Drusilla Kanner, PharmD, MBA

## 2023-12-18 NOTE — Patient Instructions (Signed)
 [facility] - A DEPT OF [facility]  Discharge Instructions: Thank you for choosing [facility] to provide your oncology and hematology care.   If you have a lab appointment with the Cancer Center, please go directly to the Cancer Center and check in at the registration area.   Wear comfortable clothing and clothing appropriate for easy access to any Portacath or PICC line.   We strive to give you quality time with your provider. You may need to reschedule your appointment if you arrive late (15 or more minutes).  Arriving late affects you and other patients whose appointments are after yours.  Also, if you miss three or more appointments without notifying the office, you may be dismissed from the clinic at the provider's discretion.      For prescription refill requests, have your pharmacy contact our office and allow 72 hours for refills to be completed.    Today you received the following chemotherapy and/or immunotherapy agents: oxaliplatin, leucovorin, fluorouracil      To help prevent nausea and vomiting after your treatment, we encourage you to take your nausea medication as directed.  BELOW ARE SYMPTOMS THAT SHOULD BE REPORTED IMMEDIATELY: *FEVER GREATER THAN 100.4 F (38 C) OR HIGHER *CHILLS OR SWEATING *NAUSEA AND VOMITING THAT IS NOT CONTROLLED WITH YOUR NAUSEA MEDICATION *UNUSUAL SHORTNESS OF BREATH *UNUSUAL BRUISING OR BLEEDING *URINARY PROBLEMS (pain or burning when urinating, or frequent urination) *BOWEL PROBLEMS (unusual diarrhea, constipation, pain near the anus) TENDERNESS IN MOUTH AND THROAT WITH OR WITHOUT PRESENCE OF ULCERS (sore throat, sores in mouth, or a toothache) UNUSUAL RASH, SWELLING OR PAIN  UNUSUAL VAGINAL DISCHARGE OR ITCHING   Items with * indicate a potential emergency and should be followed up as soon as possible or go to the Emergency Department if any problems should occur.  Please show the CHEMOTHERAPY  ALERT CARD or IMMUNOTHERAPY ALERT CARD at check-in to the Emergency Department and triage nurse.  Should you have questions after your visit or need to cancel or reschedule your appointment, please contact [facility] - A DEPT OF [facility]  Dept: [phone]  and follow the prompts.  Office hours are 8:00 a.m. to 4:30 p.m. Monday - Friday. Please note that voicemails left after 4:00 p.m. may not be returned until the following business day.  We are closed weekends and major holidays. You have access to a nurse at all times for urgent questions. Please call the main number to the clinic Dept: [phone] and follow the prompts.   For any non-urgent questions, you may also contact your provider using MyChart. We now offer e-Visits for anyone 37 and older to request care online for non-urgent symptoms. For details visit [email or web address].   Also download the MyChart app! Go to the app store, search "MyChart", open the app, select [place], and log in with your MyChart username and password.

## 2023-12-20 ENCOUNTER — Inpatient Hospital Stay: Payer: 59

## 2023-12-20 DIAGNOSIS — Z95828 Presence of other vascular implants and grafts: Secondary | ICD-10-CM

## 2023-12-20 DIAGNOSIS — C189 Malignant neoplasm of colon, unspecified: Secondary | ICD-10-CM

## 2023-12-20 DIAGNOSIS — C155 Malignant neoplasm of lower third of esophagus: Secondary | ICD-10-CM | POA: Diagnosis not present

## 2023-12-20 MED ORDER — SODIUM CHLORIDE 0.9% FLUSH
10.0000 mL | Freq: Once | INTRAVENOUS | Status: AC
Start: 2023-12-20 — End: 2023-12-20
  Administered 2023-12-20: 10 mL

## 2023-12-20 MED ORDER — HEPARIN SOD (PORK) LOCK FLUSH 100 UNIT/ML IV SOLN
500.0000 [IU] | Freq: Once | INTRAVENOUS | Status: AC
Start: 2023-12-20 — End: 2023-12-20
  Administered 2023-12-20: 500 [IU]

## 2023-12-26 ENCOUNTER — Other Ambulatory Visit: Payer: Self-pay

## 2023-12-26 ENCOUNTER — Inpatient Hospital Stay (HOSPITAL_COMMUNITY)
Admission: EM | Admit: 2023-12-26 | Discharge: 2024-01-13 | DRG: 391 | Disposition: E | Payer: 59 | Attending: Internal Medicine | Admitting: Internal Medicine

## 2023-12-26 ENCOUNTER — Encounter (HOSPITAL_COMMUNITY): Payer: Self-pay | Admitting: Emergency Medicine

## 2023-12-26 ENCOUNTER — Emergency Department (HOSPITAL_COMMUNITY): Payer: 59

## 2023-12-26 DIAGNOSIS — E876 Hypokalemia: Secondary | ICD-10-CM | POA: Diagnosis present

## 2023-12-26 DIAGNOSIS — Z7951 Long term (current) use of inhaled steroids: Secondary | ICD-10-CM | POA: Diagnosis not present

## 2023-12-26 DIAGNOSIS — E860 Dehydration: Secondary | ICD-10-CM | POA: Diagnosis present

## 2023-12-26 DIAGNOSIS — I469 Cardiac arrest, cause unspecified: Secondary | ICD-10-CM | POA: Diagnosis not present

## 2023-12-26 DIAGNOSIS — J9601 Acute respiratory failure with hypoxia: Secondary | ICD-10-CM | POA: Diagnosis not present

## 2023-12-26 DIAGNOSIS — A0811 Acute gastroenteropathy due to Norwalk agent: Principal | ICD-10-CM | POA: Diagnosis present

## 2023-12-26 DIAGNOSIS — J449 Chronic obstructive pulmonary disease, unspecified: Secondary | ICD-10-CM | POA: Diagnosis present

## 2023-12-26 DIAGNOSIS — Z87891 Personal history of nicotine dependence: Secondary | ICD-10-CM | POA: Diagnosis not present

## 2023-12-26 DIAGNOSIS — Z803 Family history of malignant neoplasm of breast: Secondary | ICD-10-CM

## 2023-12-26 DIAGNOSIS — I129 Hypertensive chronic kidney disease with stage 1 through stage 4 chronic kidney disease, or unspecified chronic kidney disease: Secondary | ICD-10-CM | POA: Diagnosis present

## 2023-12-26 DIAGNOSIS — K529 Noninfective gastroenteritis and colitis, unspecified: Secondary | ICD-10-CM | POA: Diagnosis not present

## 2023-12-26 DIAGNOSIS — R6521 Severe sepsis with septic shock: Secondary | ICD-10-CM | POA: Diagnosis not present

## 2023-12-26 DIAGNOSIS — R571 Hypovolemic shock: Secondary | ICD-10-CM | POA: Diagnosis not present

## 2023-12-26 DIAGNOSIS — Z515 Encounter for palliative care: Secondary | ICD-10-CM

## 2023-12-26 DIAGNOSIS — C159 Malignant neoplasm of esophagus, unspecified: Secondary | ICD-10-CM | POA: Diagnosis present

## 2023-12-26 DIAGNOSIS — K921 Melena: Secondary | ICD-10-CM | POA: Diagnosis not present

## 2023-12-26 DIAGNOSIS — R578 Other shock: Secondary | ICD-10-CM | POA: Diagnosis not present

## 2023-12-26 DIAGNOSIS — A419 Sepsis, unspecified organism: Secondary | ICD-10-CM | POA: Diagnosis not present

## 2023-12-26 DIAGNOSIS — Z808 Family history of malignant neoplasm of other organs or systems: Secondary | ICD-10-CM

## 2023-12-26 DIAGNOSIS — Z1152 Encounter for screening for COVID-19: Secondary | ICD-10-CM

## 2023-12-26 DIAGNOSIS — N179 Acute kidney failure, unspecified: Secondary | ICD-10-CM | POA: Diagnosis not present

## 2023-12-26 DIAGNOSIS — Z8051 Family history of malignant neoplasm of kidney: Secondary | ICD-10-CM

## 2023-12-26 DIAGNOSIS — Z8042 Family history of malignant neoplasm of prostate: Secondary | ICD-10-CM

## 2023-12-26 DIAGNOSIS — K2961 Other gastritis with bleeding: Secondary | ICD-10-CM | POA: Diagnosis not present

## 2023-12-26 DIAGNOSIS — Z66 Do not resuscitate: Secondary | ICD-10-CM | POA: Diagnosis not present

## 2023-12-26 DIAGNOSIS — Z801 Family history of malignant neoplasm of trachea, bronchus and lung: Secondary | ICD-10-CM

## 2023-12-26 DIAGNOSIS — R001 Bradycardia, unspecified: Secondary | ICD-10-CM | POA: Diagnosis not present

## 2023-12-26 DIAGNOSIS — Z83719 Family history of colon polyps, unspecified: Secondary | ICD-10-CM

## 2023-12-26 DIAGNOSIS — Z79899 Other long term (current) drug therapy: Secondary | ICD-10-CM

## 2023-12-26 DIAGNOSIS — Z95828 Presence of other vascular implants and grafts: Secondary | ICD-10-CM | POA: Diagnosis not present

## 2023-12-26 DIAGNOSIS — C155 Malignant neoplasm of lower third of esophagus: Secondary | ICD-10-CM | POA: Diagnosis present

## 2023-12-26 DIAGNOSIS — N1831 Chronic kidney disease, stage 3a: Secondary | ICD-10-CM | POA: Diagnosis present

## 2023-12-26 DIAGNOSIS — I10 Essential (primary) hypertension: Secondary | ICD-10-CM | POA: Diagnosis present

## 2023-12-26 DIAGNOSIS — E872 Acidosis, unspecified: Secondary | ICD-10-CM | POA: Diagnosis not present

## 2023-12-26 DIAGNOSIS — Z800 Family history of malignant neoplasm of digestive organs: Secondary | ICD-10-CM

## 2023-12-26 DIAGNOSIS — T17908A Unspecified foreign body in respiratory tract, part unspecified causing other injury, initial encounter: Secondary | ICD-10-CM | POA: Diagnosis not present

## 2023-12-26 DIAGNOSIS — R197 Diarrhea, unspecified: Secondary | ICD-10-CM

## 2023-12-26 DIAGNOSIS — R531 Weakness: Secondary | ICD-10-CM | POA: Diagnosis present

## 2023-12-26 DIAGNOSIS — K219 Gastro-esophageal reflux disease without esophagitis: Secondary | ICD-10-CM | POA: Diagnosis present

## 2023-12-26 DIAGNOSIS — K922 Gastrointestinal hemorrhage, unspecified: Secondary | ICD-10-CM | POA: Diagnosis not present

## 2023-12-26 DIAGNOSIS — Z8619 Personal history of other infectious and parasitic diseases: Secondary | ICD-10-CM

## 2023-12-26 DIAGNOSIS — Z85038 Personal history of other malignant neoplasm of large intestine: Secondary | ICD-10-CM

## 2023-12-26 LAB — CBC WITH DIFFERENTIAL/PLATELET
Abs Immature Granulocytes: 0.02 10*3/uL (ref 0.00–0.07)
Basophils Absolute: 0 10*3/uL (ref 0.0–0.1)
Basophils Relative: 1 %
Eosinophils Absolute: 0 10*3/uL (ref 0.0–0.5)
Eosinophils Relative: 0 %
HCT: 55.7 % — ABNORMAL HIGH (ref 39.0–52.0)
Hemoglobin: 19.1 g/dL — ABNORMAL HIGH (ref 13.0–17.0)
Immature Granulocytes: 1 %
Lymphocytes Relative: 16 %
Lymphs Abs: 0.6 10*3/uL — ABNORMAL LOW (ref 0.7–4.0)
MCH: 31 pg (ref 26.0–34.0)
MCHC: 34.3 g/dL (ref 30.0–36.0)
MCV: 90.3 fL (ref 80.0–100.0)
Monocytes Absolute: 0.5 10*3/uL (ref 0.1–1.0)
Monocytes Relative: 14 %
Neutro Abs: 2.6 10*3/uL (ref 1.7–7.7)
Neutrophils Relative %: 68 %
Platelets: 103 10*3/uL — ABNORMAL LOW (ref 150–400)
RBC: 6.17 MIL/uL — ABNORMAL HIGH (ref 4.22–5.81)
RDW: 15 % (ref 11.5–15.5)
Smear Review: DECREASED
WBC: 3.7 10*3/uL — ABNORMAL LOW (ref 4.0–10.5)
nRBC: 1.3 % — ABNORMAL HIGH (ref 0.0–0.2)

## 2023-12-26 LAB — COMPREHENSIVE METABOLIC PANEL
ALT: 33 U/L (ref 0–44)
AST: 29 U/L (ref 15–41)
Albumin: 3.1 g/dL — ABNORMAL LOW (ref 3.5–5.0)
Alkaline Phosphatase: 104 U/L (ref 38–126)
Anion gap: 18 — ABNORMAL HIGH (ref 5–15)
BUN: 65 mg/dL — ABNORMAL HIGH (ref 8–23)
CO2: 26 mmol/L (ref 22–32)
Calcium: 8.7 mg/dL — ABNORMAL LOW (ref 8.9–10.3)
Chloride: 92 mmol/L — ABNORMAL LOW (ref 98–111)
Creatinine, Ser: 1.85 mg/dL — ABNORMAL HIGH (ref 0.61–1.24)
GFR, Estimated: 39 mL/min — ABNORMAL LOW (ref >60)
Glucose, Bld: 165 mg/dL — ABNORMAL HIGH (ref 70–99)
Potassium: 3 mmol/L — ABNORMAL LOW (ref 3.5–5.1)
Sodium: 136 mmol/L (ref 135–145)
Total Bilirubin: 0.6 mg/dL (ref 0.0–1.2)
Total Protein: 6.6 g/dL (ref 6.5–8.1)

## 2023-12-26 LAB — RESP PANEL BY RT-PCR (RSV, FLU A&B, COVID)  RVPGX2
Influenza A by PCR: NEGATIVE
Influenza B by PCR: NEGATIVE
Resp Syncytial Virus by PCR: NEGATIVE
SARS Coronavirus 2 by RT PCR: NEGATIVE

## 2023-12-26 MED ORDER — LABETALOL HCL 5 MG/ML IV SOLN: 10.0000 mg | INTRAVENOUS | Status: DC | PRN

## 2023-12-26 MED ORDER — SODIUM CHLORIDE 0.9 % IV BOLUS
1000.0000 mL | Freq: Once | INTRAVENOUS | Status: AC
Start: 2023-12-26 — End: 2023-12-26
  Administered 2023-12-26: 1000 mL via INTRAVENOUS

## 2023-12-26 MED ORDER — POTASSIUM CHLORIDE 10 MEQ/100ML IV SOLN
10.0000 meq | Freq: Once | INTRAVENOUS | Status: AC
  Administered 2023-12-26: 10 meq via INTRAVENOUS
  Filled 2023-12-26: qty 100

## 2023-12-26 MED ORDER — OXYCODONE HCL 5 MG PO TABS
5.0000 mg | ORAL_TABLET | ORAL | Status: DC | PRN
  Filled 2023-12-26: qty 1

## 2023-12-26 MED ORDER — PROCHLORPERAZINE EDISYLATE 10 MG/2ML IJ SOLN: 10.0000 mg | Freq: Four times a day (QID) | INTRAMUSCULAR | Status: DC | PRN

## 2023-12-26 MED ORDER — ONDANSETRON HCL 4 MG/2ML IJ SOLN
4.0000 mg | Freq: Once | INTRAMUSCULAR | Status: AC
  Administered 2023-12-26: 4 mg via INTRAVENOUS
  Filled 2023-12-26: qty 2

## 2023-12-26 MED ORDER — ENOXAPARIN SODIUM 40 MG/0.4ML IJ SOSY
40.0000 mg | PREFILLED_SYRINGE | Freq: Every day | INTRAMUSCULAR | Status: DC
  Administered 2023-12-27: 40 mg via SUBCUTANEOUS
  Filled 2023-12-26: qty 0.4

## 2023-12-26 MED ORDER — IOHEXOL 300 MG/ML  SOLN
100.0000 mL | Freq: Once | INTRAMUSCULAR | Status: AC | PRN
  Administered 2023-12-26: 80 mL via INTRAVENOUS

## 2023-12-26 MED ORDER — MOMETASONE FURO-FORMOTEROL FUM 200-5 MCG/ACT IN AERO
2.0000 | INHALATION_SPRAY | Freq: Two times a day (BID) | RESPIRATORY_TRACT | Status: DC
  Administered 2023-12-27: 2 via RESPIRATORY_TRACT
  Filled 2023-12-26 (×2): qty 8.8

## 2023-12-26 MED ORDER — AMLODIPINE BESYLATE 10 MG PO TABS
10.0000 mg | ORAL_TABLET | Freq: Every day | ORAL | Status: DC
  Administered 2023-12-26 – 2023-12-27 (×2): 10 mg via ORAL
  Filled 2023-12-26 (×2): qty 2

## 2023-12-26 MED ORDER — MORPHINE SULFATE (PF) 2 MG/ML IV SOLN
2.0000 mg | INTRAVENOUS | Status: DC | PRN
  Administered 2023-12-27 (×2): 2 mg via INTRAVENOUS
  Filled 2023-12-26 (×2): qty 1

## 2023-12-26 MED ORDER — ONDANSETRON HCL 4 MG/2ML IJ SOLN: 4.0000 mg | Freq: Four times a day (QID) | INTRAMUSCULAR | Status: DC | PRN

## 2023-12-26 MED ORDER — HYDRALAZINE HCL 20 MG/ML IJ SOLN: 10.0000 mg | INTRAMUSCULAR | Status: DC | PRN

## 2023-12-26 MED ORDER — AMLODIPINE BESYLATE 5 MG PO TABS: 10.0000 mg | ORAL_TABLET | Freq: Every day | ORAL | Status: DC

## 2023-12-26 MED ORDER — SODIUM CHLORIDE 0.9% FLUSH
3.0000 mL | Freq: Two times a day (BID) | INTRAVENOUS | Status: DC
  Administered 2023-12-26 – 2023-12-27 (×2): 3 mL via INTRAVENOUS

## 2023-12-26 MED ORDER — SODIUM CHLORIDE 0.9 % IV SOLN: INTRAVENOUS | Status: DC

## 2023-12-26 MED ORDER — ACETAMINOPHEN 325 MG PO TABS: 650.0000 mg | ORAL_TABLET | Freq: Four times a day (QID) | ORAL | Status: DC | PRN

## 2023-12-26 MED ORDER — HYDROMORPHONE HCL 1 MG/ML IJ SOLN
0.5000 mg | Freq: Once | INTRAMUSCULAR | Status: AC
  Administered 2023-12-26: 0.5 mg via INTRAVENOUS
  Filled 2023-12-26: qty 1

## 2023-12-26 MED ORDER — SODIUM CHLORIDE 0.9 % IV BOLUS
1000.0000 mL | Freq: Once | INTRAVENOUS | Status: AC
  Administered 2023-12-26: 1000 mL via INTRAVENOUS

## 2023-12-26 MED ORDER — ACETAMINOPHEN 650 MG RE SUPP: 650.0000 mg | Freq: Four times a day (QID) | RECTAL | Status: DC | PRN

## 2023-12-26 NOTE — Assessment & Plan Note (Signed)
-   Continue amlodipine - Hold chlorthalidone -PRN meds also

## 2023-12-26 NOTE — Assessment & Plan Note (Signed)
-   has tolerated chemo previously; developed diarrhea and mild abd pain with some assoc N/V that started on 2/9; has been on augmentin from 2/3 - 2/11 (just completed) -C. difficile negative - GI pathogen panel positive for norovirus, likely explaining CT findings and symptoms -Continue supportive care -IV fluids if needed, continue diet for now

## 2023-12-26 NOTE — ED Triage Notes (Signed)
Patient comes in via ems -history of cancer. Dx of bronchitis about a week ago taking Augmentin, diarrhea since Sunday.  Progressing weakness since diarrhea started.

## 2023-12-26 NOTE — Assessment & Plan Note (Addendum)
-   Follows with Dr. Mosetta Putt -Distal esophageal carcinoma; currently on FOLFOX; last chemo 12/05/23; dose on 2/3 delayed for now

## 2023-12-26 NOTE — Assessment & Plan Note (Signed)
-   CT abdomen/pelvis was performed which showed diffuse enteritis involving majority of the small bowel and prominent mucosal enhancement of the pylorus suspicious for gastritis.  There was also suspected colitis involving distal descending, sigmoid, and rectosigmoid colon.

## 2023-12-26 NOTE — H&P (Signed)
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  He was recently started on Augmentin on 12/18/2023 for 1 week due to concern for URI.  He completed Augmentin on 12/26/2023.  He has no further respiratory symptoms and his cough has resolved.  His symptoms of diarrhea started on 12/24/2023. He recently had cycle 3 of chemo on 12/18/23.   He underwent workup in the ER and was found to have elevated creatinine, BUN.  CBC also suggested hemoconcentration with elevated hemoglobin. CT abdomen/pelvis was performed which showed diffuse enteritis involving majority of the small bowel and prominent mucosal enhancement of the pylorus suspicious for gastritis.  There was also suspected colitis involving distal descending, sigmoid, and rectosigmoid colon.  I have personally briefly reviewed patient's old medical records in [facility] and discussed patient with the ER provider when appropriate/indicated.  Assessment and Plan: * Diarrhea - has tolerated chemo previously; developed diarrhea and mild abd pain with some assoc N/V that started on 2/9; has been on augmentin from 2/3 - 2/11 (just completed) - differential includes abx side effect vs infectious (Cdiff, etc) vs chemo associated - check stool studies - s/p CT showing diffuse enteritis, gastritis, and distal colitis - continue IVF, pain, and nausea control   Acute renal failure superimposed on stage 3a chronic kidney disease (HCC) - patient has history of CKD3a. Baseline creat ~ 1.3 - 1.5, eGFR~ 48-50 - patient presents with increase in creat >0.3 mg/dL above baseline or creat increase >1.5x baseline presumed to have occurred within past 7 days PTA -  creat 1.85 on admission -Suspect hypovolemic and prerenal in etiology from nausea, vomiting, diarrhea - Continue fluids - Repeat BMP in a.m.   Enteritis - CT abdomen/pelvis was performed which showed diffuse enteritis involving majority of the small bowel and prominent mucosal enhancement of the pylorus suspicious for gastritis.  There was also suspected colitis involving distal descending, sigmoid, and rectosigmoid colon.  Hypokalemia - replete as needed  Colitis - see enteritis  Esophageal cancer (HCC) - Follows with Dr. Mosetta Putt -Distal esophageal carcinoma; currently on FOLFOX; last chemo 12/05/23; dose on 2/3 delayed for now  Hypertension - Continue amlodipine - Hold chlorthalidone -PRN meds also    Code Status:     Code Status: Full Code  DVT Prophylaxis:   enoxaparin (LOVENOX) injection 40 mg Start: 12/27/23 1000   Anticipated disposition is to: Home  History: Past Medical History:  Diagnosis Date   Allergy    seasonal   Aneurysm of infrarenal abdominal aorta (HCC)    4.2 cm 07/12/23   Arthritis    Colon cancer (HCC)    Compression fracture of thoracic vertebra with routine healing 04/30/2020   COPD (chronic obstructive pulmonary disease) (HCC)    Family history of adverse reaction to anesthesia    mother slow to awaken   GERD (gastroesophageal reflux disease)    Hepatitis    hepatitis c dx in remission per pt   Hypertension    Inguinal hernia    Multiple fractures 06/04/2020   Positive colorectal cancer screening using Cologuard test 08/27/2020   Substance abuse (HCC)    Wears glasses    for reading    Past Surgical  History:  Procedure Laterality Date   COLONOSCOPY  11/11/2020   ENDOSCOPIC MUCOSAL RESECTION N/A 03/10/2021   Procedure: ENDOSCOPIC MUCOSAL RESECTION;  Surgeon: Meridee Score Netty Starring., MD;  Location: [facility];  Service: Gastroenterology;  Laterality: N/A;   ESOPHAGOGASTRODUODENOSCOPY (EGD) WITH PROPOFOL N/A 07/27/2023   Procedure:  ESOPHAGOGASTRODUODENOSCOPY (EGD) WITH PROPOFOL;  Surgeon: Meridee Score Netty Starring., MD;  Location: [facility];  Service: Gastroenterology;  Laterality: N/A;   EUS N/A 07/27/2023   Procedure: UPPER ENDOSCOPIC ULTRASOUND (EUS) RADIAL;  Surgeon: Lemar Lofty., MD;  Location: [facility];  Service: Gastroenterology;  Laterality: N/A;   FLEXIBLE SIGMOIDOSCOPY N/A 03/10/2021   Procedure: FLEXIBLE SIGMOIDOSCOPY;  Surgeon: Meridee Score Netty Starring., MD;  Location: [facility];  Service: Gastroenterology;  Laterality: N/A;   HEMOSTASIS CLIP PLACEMENT  03/10/2021   Procedure: HEMOSTASIS CLIP PLACEMENT;  Surgeon: Lemar Lofty., MD;  Location: [facility];  Service: Gastroenterology;;   INGUINAL HERNIA REPAIR Right 01/19/2021   Procedure: LAPAROSCOPIC RIGHT INGUINAL HERNIA REPAIR WITH MESH;  Surgeon: Stechschulte, Hyman Hopes, MD;  Location: [facility] [place];  Service: General;  Laterality: Right;   IR IMAGING GUIDED PORT INSERTION  08/17/2023   obstruction removed as a child   swallowed a peanut   POLYPECTOMY  03/10/2021   Procedure: POLYPECTOMY;  Surgeon: Lemar Lofty., MD;  Location: [facility];  Service: Gastroenterology;;   POLYPECTOMY     SUBMUCOSAL LIFTING INJECTION  03/10/2021   Procedure: SUBMUCOSAL LIFTING INJECTION;  Surgeon: Lemar Lofty., MD;  Location: [facility];  Service: Gastroenterology;;     reports that he quit smoking about 50 years ago. His smoking use included cigarettes. He started smoking about 3 years ago. He has never used smokeless tobacco. He reports that he does not currently use alcohol after a past usage of about 1.0 standard drink of alcohol per week. He reports current drug use. Frequency: 1.00 time per week. Drug: Cocaine.  No Known Allergies  Family History  Problem Relation Age of Onset   Colon polyps Father    Stomach cancer Father 77   Prostate cancer Father        dx late 75s   Lung cancer Sister 75       small  cell; smoking hx   Pancreatic cancer Sister 67   Colon polyps Brother        less than 10 lifetime   Colon polyps Brother        less than 10 lifetime   Lung cancer Maternal Aunt        d. 73s; smoking hx   Prostate cancer Maternal Uncle        x4 mat uncles; dx >50; no mets   Kidney cancer Maternal Uncle 29   Melanoma Paternal Uncle        dx >50; systemic therapy   Lung cancer Paternal Uncle        dx >50   Breast cancer Cousin        mat male cousin; dx <50   Uterine cancer Cousin        mat male cousin dx <50   Esophageal cancer Cousin        mat male cousin dx >50   Colon cancer Neg Hx    Rectal cancer Neg Hx    Inflammatory bowel disease Neg Hx    Liver disease Neg Hx    Home Medications: Prior to Admission medications   Medication Sig Start Date End Date Taking? Authorizing Provider  albuterol (VENTOLIN HFA)  108 (90 Base) MCG/ACT inhaler Inhale 2 puffs into the lungs every 6 (six) hours as needed for wheezing or shortness of breath. 07/04/23   Jordan Frisk, MD  amLODipine (NORVASC) 10 MG tablet Take 1 tablet (10 mg total) by mouth daily. 07/04/23   Jordan Frisk, MD  amoxicillin-clavulanate (AUGMENTIN) 875-125 MG tablet Take 1 tablet by mouth 2 (two) times daily. 12/18/23   Malachy Mood, MD  chlorthalidone (HYGROTON) 25 MG tablet Take 1 tablet (25 mg total) by mouth daily. 11/13/23   Hoy Register, MD  DELSYM 30 MG/5ML liquid Take 30 mg by mouth 2 (two) times daily as needed for cough. Patient not taking: Reported on 11/13/2023    [provider]  dexamethasone (DECADRON) 4 MG tablet Take 2 tablets (8 mg total) by mouth daily. Start the day after chemotherapy for 2 days. Take with food. 10/10/23   Malachy Mood, MD  fluticasone-salmeterol (ADVAIR HFA) [id number] MCG/ACT inhaler Inhale 2 puffs into the lungs 2 (two) times daily. 05/16/23   Jordan Frisk, MD  guaiFENesin-codeine 100-10 MG/5ML syrup Take 5 mLs by mouth every 6 (six) hours as needed for cough.  11/22/23   Carlean Jews, NP  lidocaine-prilocaine (EMLA) cream Apply 1 Application topically as needed. Patient taking differently: Apply 1 Application topically as needed (for port access). 08/14/23   Malachy Mood, MD  lidocaine-prilocaine (EMLA) cream Apply to affected area once 10/10/23   Malachy Mood, MD  Multiple Vitamins-Minerals (CENTRUM SILVER 50+MEN) TABS Take 1 tablet by mouth daily with breakfast.    [provider]  omeprazole (PRILOSEC) 40 MG capsule Take 1 capsule (40 mg total) by mouth daily before dinner. 07/04/23   Jordan Frisk, MD  ondansetron (ZOFRAN) 8 MG tablet Take 1 tablet (8 mg total) by mouth every 8 (eight) hours as needed for nausea or vomiting. Start on the third day after chemotherapy. 10/10/23   Malachy Mood, MD  oxyCODONE (OXY IR/ROXICODONE) 5 MG immediate release tablet Take 1 tablet (5 mg total) by mouth every 8 (eight) hours as needed for pain. Patient not taking: Reported on 11/22/2023 10/16/23     oxyCODONE-acetaminophen (PERCOCET/ROXICET) 5-325 MG tablet Take 1 tablet by mouth every 6 (six) hours as needed for severe pain (pain score 7-10). Patient not taking: Reported on 11/13/2023 10/06/23   Barrett, Horald Chestnut, PA-C  prochlorperazine (COMPAZINE) 10 MG tablet Take 1 tablet (10 mg total) by mouth every 6 (six) hours as needed for nausea or vomiting. 10/10/23   Malachy Mood, MD  sildenafil (VIAGRA) 100 MG tablet Take 1 tablet (100 mg total) by mouth daily as needed for erectile dysfunction. 10/11/22 10/11/23  Jordan Frisk, MD  sucralfate (CARAFATE) 1 g tablet Take 1 tablet (1 g total) by mouth 4 (four) times daily -  with meals and at bedtime. 08/22/23   Malachy Mood, MD    Review of Systems:  Review of Systems  Constitutional: Negative.   HENT: Negative.    Eyes: Negative.   Respiratory: Negative.    Cardiovascular: Negative.   Gastrointestinal:  Positive for abdominal pain, diarrhea, nausea and vomiting.  Genitourinary: Negative.   Musculoskeletal:  Negative.   Skin: Negative.   Neurological: Negative.   Endo/Heme/Allergies: Negative.   Psychiatric/Behavioral: Negative.      Physical Exam:  Vitals:   12/26/23 1510 12/26/23 1630 12/26/23 1730 12/26/23 1800  BP: (!) 136/97 (!) 137/94 (!) 142/85 (!) 146/94  Pulse: 96 92 98 95  Resp: 17 16 15 16   Temp:  99.1 F (37.3 C)     TempSrc: Rectal     SpO2: 99% 92% 91% 93%  Weight:      Height:       Physical Exam Constitutional:      General: He is not in acute distress.    Comments: Lethargic appearing; mildly uncomfortable  HENT:     Head: Normocephalic and atraumatic.     Mouth/Throat:     Mouth: Mucous membranes are moist.  Eyes:     Extraocular Movements: Extraocular movements intact.  Cardiovascular:     Rate and Rhythm: Normal rate and regular rhythm.  Pulmonary:     Effort: Pulmonary effort is normal. No respiratory distress.     Breath sounds: Normal breath sounds. No wheezing.  Abdominal:     General: Bowel sounds are normal. There is no distension.     Palpations: Abdomen is soft. Mass: mild and nonspecific.     Tenderness: There is abdominal tenderness.  Musculoskeletal:        General: Normal range of motion.     Cervical back: Normal range of motion and neck supple.  Skin:    General: Skin is warm and dry.  Neurological:     General: No focal deficit present.     Mental Status: He is alert.  Psychiatric:        Mood and Affect: Mood normal.        Behavior: Behavior normal.      Labs on Admission:  I have personally reviewed following labs and imaging studies Results for orders placed or performed during the hospital encounter of 12/26/23 (from the past 24 hours)  CBC with Differential     Status: Abnormal   Collection Time: 12/26/23  4:34 PM  Result Value Ref Range   WBC 3.7 (L) 4.0 - 10.5 K/uL   RBC 6.17 (H) 4.22 - 5.81 MIL/uL   Hemoglobin 19.1 (H) 13.0 - 17.0 g/dL   HCT 91.4 (H) 78.2 - 95.6 %   MCV 90.3 80.0 - 100.0 fL   MCH 31.0 26.0 - 34.0  pg   MCHC 34.3 30.0 - 36.0 g/dL   RDW 21.3 08.6 - 57.8 %   Platelets 103 (L) 150 - 400 K/uL   nRBC 1.3 (H) 0.0 - 0.2 %   Neutrophils Relative % 68 %   Neutro Abs 2.6 1.7 - 7.7 K/uL   Lymphocytes Relative 16 %   Lymphs Abs 0.6 (L) 0.7 - 4.0 K/uL   Monocytes Relative 14 %   Monocytes Absolute 0.5 0.1 - 1.0 K/uL   Eosinophils Relative 0 %   Eosinophils Absolute 0.0 0.0 - 0.5 K/uL   Basophils Relative 1 %   Basophils Absolute 0.0 0.0 - 0.1 K/uL   WBC Morphology Mild Left Shift (1-5% metas, occ myelo)    Smear Review PLATELETS APPEAR DECREASED    Immature Granulocytes 1 %   Abs Immature Granulocytes 0.02 0.00 - 0.07 K/uL   Polychromasia PRESENT   Comprehensive metabolic panel     Status: Abnormal   Collection Time: 12/26/23  4:34 PM  Result Value Ref Range   Sodium 136 135 - 145 mmol/L   Potassium 3.0 (L) 3.5 - 5.1 mmol/L   Chloride 92 (L) 98 - 111 mmol/L   CO2 26 22 - 32 mmol/L   Glucose, Bld 165 (H) 70 - 99 mg/dL   BUN 65 (H) 8 - 23 mg/dL   Creatinine, Ser 4.69 (H) 0.61 - 1.24 mg/dL   Calcium  8.7 (L) 8.9 - 10.3 mg/dL   Total Protein 6.6 6.5 - 8.1 g/dL   Albumin 3.1 (L) 3.5 - 5.0 g/dL   AST 29 15 - 41 U/L   ALT 33 0 - 44 U/L   Alkaline Phosphatase 104 38 - 126 U/L   Total Bilirubin 0.6 0.0 - 1.2 mg/dL   GFR, Estimated 39 (L) >60 mL/min   Anion gap 18 (H) 5 - 15  Resp panel by RT-PCR (RSV, Flu A&B, Covid) Anterior Nasal Swab     Status: None   Collection Time: 12/26/23  4:34 PM   Specimen: Anterior Nasal Swab  Result Value Ref Range   SARS Coronavirus 2 by RT PCR NEGATIVE NEGATIVE   Influenza A by PCR NEGATIVE NEGATIVE   Influenza B by PCR NEGATIVE NEGATIVE   Resp Syncytial Virus by PCR NEGATIVE NEGATIVE     Radiological Exams on Admission: CT ABDOMEN PELVIS W CONTRAST Result Date: 12/26/2023 CLINICAL DATA:  Abdomen pain diarrhea EXAM: CT ABDOMEN AND PELVIS WITH CONTRAST TECHNIQUE: Multidetector CT imaging of the abdomen and pelvis was performed using the standard  protocol following bolus administration of intravenous contrast. RADIATION DOSE REDUCTION: This exam was performed according to the departmental dose-optimization program which includes automated exposure control, adjustment of the mA and/or kV according to patient size and/or use of iterative reconstruction technique. CONTRAST:  80mL OMNIPAQUE IOHEXOL 300 MG/ML  SOLN COMPARISON:  CT 10/10/2023, PET CT 07/24/2023, CT abdomen pelvis 04/03/2020 FINDINGS: Lower chest: Lung bases demonstrate no acute airspace disease. Bandlike scarring in the right base. Prominent circumferential distal esophageal thickening with mucosal enhancement incompletely visualized with fluid in the distal esophagus. Hepatobiliary: No focal liver abnormality is seen. No gallstones, gallbladder wall thickening, or biliary dilatation. Pancreas: Unremarkable. No pancreatic ductal dilatation or surrounding inflammatory changes. Spleen: Normal in size without focal abnormality. Adrenals/Urinary Tract: Adrenal glands are normal. Atrophic right kidney. Multifocal cortical hypodensity and volume loss consistent with scarring. No hydronephrosis. The bladder is unremarkable Stomach/Bowel: Marked fluid distension of the stomach. Prominent mucosal enhancement of the pylorus. Diffuse mucosal enhancement of the duodenum, jejunum and majority of small bowel with thickened appearing small bowel suggesting enteritis. Mild fluid in the colon. Probable mild colon wall thickening involving the distal descending and sigmoid/rectosigmoid colon. Vascular/Lymphatic: Advanced aortic atherosclerosis. Infrarenal abdominal aortic aneurysm measuring up to 4.3 cm with considerable mural thrombus. No suspicious lymph nodes. Reproductive: Prostate is unremarkable. Other: Negative for pelvic effusion or free air. Musculoskeletal: No acute or suspicious osseous abnormality IMPRESSION: 1. Findings consistent with diffuse enteritis involving the majority of small bowel. Prominent  mucosal enhancement of the pylorus suspicious for gastritis. Suspected colitis type changes of the distal descending, sigmoid and rectosigmoid colon. Mild fluid in the colon corresponding to history of diarrhea 2. Marked circumferential wall thickening of the distal esophagus incompletely assessed with mucosal enhancement presumably corresponding to the history of esophageal malignancy. Considerable fluid distension of the stomach, consider NG decompression 3. Infrarenal abdominal aortic aneurysm measuring up to 4.3 cm. Recommend follow-up CT or MR as appropriate in 12 months and referral to or continued care with vascular specialist. (Ref.: J Vasc Surg. 2018; 67:2-77 and J Am Coll Radiol 2013;10(10):789-794.) . 4. Atrophic right kidney with multifocal cortical scarring in the right greater than left kidney. 5. Aortic atherosclerosis. Aortic Atherosclerosis (ICD10-I70.0). Electronically Signed   By: Jasmine Pang M.D.   On: 12/26/2023 19:30   CT ABDOMEN PELVIS W CONTRAST  Final Result       EKG: Independently  reviewed. NSR, QTc 484   Lewie Chamber, MD [facility] 12/26/2023, 8:53 PM

## 2023-12-26 NOTE — Assessment & Plan Note (Signed)
-   patient has history of CKD3a. Baseline creat ~ 1.3 - 1.5, eGFR~ 48-50 - patient presents with increase in creat >0.3 mg/dL above baseline or creat increase >1.5x baseline presumed to have occurred within past 7 days PTA - creat 1.85 on admission -Suspect hypovolemic and prerenal in etiology from nausea, vomiting, diarrhea - Continue fluids - Repeat BMP in a.m.

## 2023-12-26 NOTE — Assessment & Plan Note (Signed)
-   see enteritis

## 2023-12-26 NOTE — ED Provider Notes (Signed)
[place] EMERGENCY DEPARTMENT AT [facility] Provider Note   CSN: [id number] Arrival date & time: 12/26/23  1454     History {Add pertinent medical, surgical, social history, OB history to HPI:1} Chief Complaint  Patient presents with   Weakness   Diarrhea    Jordan [name] is a 68 y.o. male.  Patient has a history of colon cancer.  Patient received chemotherapy 8 days ago and started having diarrhea and vomiting 2 days ago.   Weakness Associated symptoms: diarrhea   Diarrhea      Home Medications Prior to Admission medications   Medication Sig Start Date End Date Taking? Authorizing Provider  albuterol (VENTOLIN HFA) 108 (90 Base) MCG/ACT inhaler Inhale 2 puffs into the lungs every 6 (six) hours as needed for wheezing or shortness of breath. 07/04/23   Storm Frisk, MD  amLODipine (NORVASC) 10 MG tablet Take 1 tablet (10 mg total) by mouth daily. 07/04/23   Storm Frisk, MD  amoxicillin-clavulanate (AUGMENTIN) 875-125 MG tablet Take 1 tablet by mouth 2 (two) times daily. 12/18/23   Malachy Mood, MD  chlorthalidone (HYGROTON) 25 MG tablet Take 1 tablet (25 mg total) by mouth daily. 11/13/23   Hoy Register, MD  DELSYM 30 MG/5ML liquid Take 30 mg by mouth 2 (two) times daily as needed for cough. Patient not taking: Reported on 11/13/2023    [provider]  dexamethasone (DECADRON) 4 MG tablet Take 2 tablets (8 mg total) by mouth daily. Start the day after chemotherapy for 2 days. Take with food. 10/10/23   Malachy Mood, MD  fluticasone-salmeterol (ADVAIR HFA) [phone] MCG/ACT inhaler Inhale 2 puffs into the lungs 2 (two) times daily. 05/16/23   Storm Frisk, MD  guaiFENesin-codeine 100-10 MG/5ML syrup Take 5 mLs by mouth every 6 (six) hours as needed for cough. 11/22/23   Carlean Jews, NP  lidocaine-prilocaine (EMLA) cream Apply 1 Application topically as needed. Patient taking differently: Apply 1 Application topically as needed (for port  access). 08/14/23   Malachy Mood, MD  lidocaine-prilocaine (EMLA) cream Apply to affected area once 10/10/23   Malachy Mood, MD  Multiple Vitamins-Minerals (CENTRUM SILVER 50+MEN) TABS Take 1 tablet by mouth daily with breakfast.    [provider]  omeprazole (PRILOSEC) 40 MG capsule Take 1 capsule (40 mg total) by mouth daily before dinner. 07/04/23   Storm Frisk, MD  ondansetron (ZOFRAN) 8 MG tablet Take 1 tablet (8 mg total) by mouth every 8 (eight) hours as needed for nausea or vomiting. Start on the third day after chemotherapy. 10/10/23   Malachy Mood, MD  oxyCODONE (OXY IR/ROXICODONE) 5 MG immediate release tablet Take 1 tablet (5 mg total) by mouth every 8 (eight) hours as needed for pain. Patient not taking: Reported on 11/22/2023 10/16/23     oxyCODONE-acetaminophen (PERCOCET/ROXICET) 5-325 MG tablet Take 1 tablet by mouth every 6 (six) hours as needed for severe pain (pain score 7-10). Patient not taking: Reported on 11/13/2023 10/06/23   Barrett, Horald Chestnut, PA-C  prochlorperazine (COMPAZINE) 10 MG tablet Take 1 tablet (10 mg total) by mouth every 6 (six) hours as needed for nausea or vomiting. 10/10/23   Malachy Mood, MD  sildenafil (VIAGRA) 100 MG tablet Take 1 tablet (100 mg total) by mouth daily as needed for erectile dysfunction. 10/11/22 10/11/23  Storm Frisk, MD  sucralfate (CARAFATE) 1 g tablet Take 1 tablet (1 g total) by mouth 4 (four) times daily -  with meals and  at bedtime. 08/22/23   Malachy Mood, MD      Allergies    Patient has no known allergies.    Review of Systems   Review of Systems  Gastrointestinal:  Positive for diarrhea.  Neurological:  Positive for weakness.    Physical Exam Updated Vital Signs BP (!) 146/94   Pulse 95   Temp 99.1 F (37.3 C) (Rectal)   Resp 16   Ht 5\' 11"  (1.803 m)   Wt 77.1 kg   SpO2 93%   BMI 23.71 kg/m  Physical Exam  ED Results / Procedures / Treatments   Labs (all labs ordered are listed, but only abnormal results are  displayed) Labs Reviewed  CBC WITH DIFFERENTIAL/PLATELET - Abnormal; Notable for the following components:      Result Value   WBC 3.7 (*)    RBC 6.17 (*)    Hemoglobin 19.1 (*)    HCT 55.7 (*)    Platelets 103 (*)    nRBC 1.3 (*)    Lymphs Abs 0.6 (*)    All other components within normal limits  COMPREHENSIVE METABOLIC PANEL - Abnormal; Notable for the following components:   Potassium 3.0 (*)    Chloride 92 (*)    Glucose, Bld 165 (*)    BUN 65 (*)    Creatinine, Ser 1.85 (*)    Calcium 8.7 (*)    Albumin 3.1 (*)    GFR, Estimated 39 (*)    Anion gap 18 (*)    All other components within normal limits  RESP PANEL BY RT-PCR (RSV, FLU A&B, COVID)  [id number]  C DIFFICILE QUICK SCREEN W PCR REFLEX    GASTROINTESTINAL PANEL BY PCR, STOOL (REPLACES STOOL CULTURE)    EKG None  Radiology CT ABDOMEN PELVIS W CONTRAST Result Date: 12/26/2023 CLINICAL DATA:  Abdomen pain diarrhea EXAM: CT ABDOMEN AND PELVIS WITH CONTRAST TECHNIQUE: Multidetector CT imaging of the abdomen and pelvis was performed using the standard protocol following bolus administration of intravenous contrast. RADIATION DOSE REDUCTION: This exam was performed according to the departmental dose-optimization program which includes automated exposure control, adjustment of the mA and/or kV according to patient size and/or use of iterative reconstruction technique. CONTRAST:  80mL OMNIPAQUE IOHEXOL 300 MG/ML  SOLN COMPARISON:  CT 10/10/2023, PET CT 07/24/2023, CT abdomen pelvis 04/03/2020 FINDINGS: Lower chest: Lung bases demonstrate no acute airspace disease. Bandlike scarring in the right base. Prominent circumferential distal esophageal thickening with mucosal enhancement incompletely visualized with fluid in the distal esophagus. Hepatobiliary: No focal liver abnormality is seen. No gallstones, gallbladder wall thickening, or biliary dilatation. Pancreas: Unremarkable. No pancreatic ductal dilatation or surrounding  inflammatory changes. Spleen: Normal in size without focal abnormality. Adrenals/Urinary Tract: Adrenal glands are normal. Atrophic right kidney. Multifocal cortical hypodensity and volume loss consistent with scarring. No hydronephrosis. The bladder is unremarkable Stomach/Bowel: Marked fluid distension of the stomach. Prominent mucosal enhancement of the pylorus. Diffuse mucosal enhancement of the duodenum, jejunum and majority of small bowel with thickened appearing small bowel suggesting enteritis. Mild fluid in the colon. Probable mild colon wall thickening involving the distal descending and sigmoid/rectosigmoid colon. Vascular/Lymphatic: Advanced aortic atherosclerosis. Infrarenal abdominal aortic aneurysm measuring up to 4.3 cm with considerable mural thrombus. No suspicious lymph nodes. Reproductive: Prostate is unremarkable. Other: Negative for pelvic effusion or free air. Musculoskeletal: No acute or suspicious osseous abnormality IMPRESSION: 1. Findings consistent with diffuse enteritis involving the majority of small bowel. Prominent mucosal enhancement of the pylorus suspicious for gastritis. Suspected  colitis type changes of the distal descending, sigmoid and rectosigmoid colon. Mild fluid in the colon corresponding to history of diarrhea 2. Marked circumferential wall thickening of the distal esophagus incompletely assessed with mucosal enhancement presumably corresponding to the history of esophageal malignancy. Considerable fluid distension of the stomach, consider NG decompression 3. Infrarenal abdominal aortic aneurysm measuring up to 4.3 cm. Recommend follow-up CT or MR as appropriate in 12 months and referral to or continued care with vascular specialist. (Ref.: J Vasc Surg. 2018; 67:2-77 and J Am Coll Radiol 2013;10(10):789-794.) . 4. Atrophic right kidney with multifocal cortical scarring in the right greater than left kidney. 5. Aortic atherosclerosis. Aortic Atherosclerosis (ICD10-I70.0).  Electronically Signed   By: Jasmine Pang M.D.   On: 12/26/2023 19:30    Procedures Procedures  {Document cardiac monitor, telemetry assessment procedure when appropriate:1}  Medications Ordered in ED Medications  sodium chloride 0.9 % bolus 1,000 mL (has no administration in time range)  potassium chloride 10 mEq in 100 mL IVPB (has no administration in time range)  potassium chloride 10 mEq in 100 mL IVPB (has no administration in time range)  ondansetron (ZOFRAN) injection 4 mg (4 mg Intravenous Given 12/26/23 1609)  sodium chloride 0.9 % bolus 1,000 mL (0 mLs Intravenous Stopped 12/26/23 1833)  HYDROmorphone (DILAUDID) injection 0.5 mg (0.5 mg Intravenous Given 12/26/23 1608)  iohexol (OMNIPAQUE) 300 MG/ML solution 100 mL (80 mLs Intravenous Contrast Given 12/26/23 1837)    ED Course/ Medical Decision Making/ A&P   {   Click here for ABCD2, HEART and other calculatorsREFRESH Note before signing :1}                              Medical Decision Making Amount and/or Complexity of Data Reviewed Labs: ordered. Radiology: ordered.  Risk Prescription drug management. Decision regarding hospitalization.   Patient with gastroenteritis along with severe dehydration and hypokalemia.  He will be admitted to medicine  {Document critical care time when appropriate:1} {Document review of labs and clinical decision tools ie heart score, Chads2Vasc2 etc:1}  {Document your independent review of radiology images, and any outside records:1} {Document your discussion with family members, caretakers, and with consultants:1} {Document social determinants of health affecting pt's care:1} {Document your decision making why or why not admission, treatments were needed:1} Final Clinical Impression(s) / ED Diagnoses Final diagnoses:  Dehydration  Gastroenteritis    Rx / DC Orders ED Discharge Orders     None

## 2023-12-26 NOTE — Assessment & Plan Note (Signed)
replete as needed.

## 2023-12-26 NOTE — Hospital Course (Addendum)
Mr. 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  He was recently started on Augmentin on 12/18/2023 for 1 week due to concern for URI.  He completed Augmentin on 12/26/2023.  He has no further respiratory symptoms and his cough has resolved.  His symptoms of diarrhea started on 12/24/2023. He recently had cycle 3 of chemo on 12/18/23.   He underwent workup in the ER and was found to have elevated creatinine, BUN.  CBC also suggested hemoconcentration with elevated hemoglobin. CT abdomen/pelvis was performed which showed diffuse enteritis involving majority of the small bowel and prominent mucosal enhancement of the pylorus suspicious for gastritis.  There was also suspected colitis involving distal descending, sigmoid, and rectosigmoid colon.

## 2023-12-27 ENCOUNTER — Inpatient Hospital Stay (HOSPITAL_COMMUNITY): Payer: 59

## 2023-12-27 DIAGNOSIS — K529 Noninfective gastroenteritis and colitis, unspecified: Secondary | ICD-10-CM | POA: Diagnosis not present

## 2023-12-27 DIAGNOSIS — A0811 Acute gastroenteropathy due to Norwalk agent: Secondary | ICD-10-CM | POA: Diagnosis not present

## 2023-12-27 DIAGNOSIS — N179 Acute kidney failure, unspecified: Secondary | ICD-10-CM | POA: Diagnosis not present

## 2023-12-27 DIAGNOSIS — N1831 Chronic kidney disease, stage 3a: Secondary | ICD-10-CM | POA: Diagnosis not present

## 2023-12-27 LAB — C DIFFICILE QUICK SCREEN W PCR REFLEX
C Diff antigen: NEGATIVE
C Diff interpretation: NOT DETECTED
C Diff toxin: NEGATIVE

## 2023-12-27 LAB — CBC WITH DIFFERENTIAL/PLATELET
Abs Immature Granulocytes: 0.03 10*3/uL (ref 0.00–0.07)
Basophils Absolute: 0 10*3/uL (ref 0.0–0.1)
Basophils Relative: 1 %
Eosinophils Absolute: 0 10*3/uL (ref 0.0–0.5)
Eosinophils Relative: 0 %
HCT: 48.1 % (ref 39.0–52.0)
Hemoglobin: 16.7 g/dL (ref 13.0–17.0)
Immature Granulocytes: 1 %
Lymphocytes Relative: 11 %
Lymphs Abs: 0.4 10*3/uL — ABNORMAL LOW (ref 0.7–4.0)
MCH: 31.2 pg (ref 26.0–34.0)
MCHC: 34.7 g/dL (ref 30.0–36.0)
MCV: 89.7 fL (ref 80.0–100.0)
Monocytes Absolute: 0.5 10*3/uL (ref 0.1–1.0)
Monocytes Relative: 16 %
Neutro Abs: 2.5 10*3/uL (ref 1.7–7.7)
Neutrophils Relative %: 71 %
Platelets: 78 10*3/uL — ABNORMAL LOW (ref 150–400)
RBC: 5.36 MIL/uL (ref 4.22–5.81)
RDW: 14.4 % (ref 11.5–15.5)
WBC Morphology: INCREASED
WBC: 3.4 10*3/uL — ABNORMAL LOW (ref 4.0–10.5)
nRBC: 0.9 % — ABNORMAL HIGH (ref 0.0–0.2)

## 2023-12-27 LAB — GASTROINTESTINAL PANEL BY PCR, STOOL (REPLACES STOOL CULTURE)

## 2023-12-27 LAB — BASIC METABOLIC PANEL
Anion gap: 10 (ref 5–15)
BUN: 67 mg/dL — ABNORMAL HIGH (ref 8–23)
CO2: 23 mmol/L (ref 22–32)
Calcium: 7.5 mg/dL — ABNORMAL LOW (ref 8.9–10.3)
Chloride: 96 mmol/L — ABNORMAL LOW (ref 98–111)
Creatinine, Ser: 1.51 mg/dL — ABNORMAL HIGH (ref 0.61–1.24)
GFR, Estimated: 50 mL/min — ABNORMAL LOW (ref >60)
Glucose, Bld: 159 mg/dL — ABNORMAL HIGH (ref 70–99)
Potassium: 2.3 mmol/L — CL (ref 3.5–5.1)
Sodium: 129 mmol/L — ABNORMAL LOW (ref 135–145)

## 2023-12-27 LAB — MAGNESIUM: Magnesium: 1.8 mg/dL (ref 1.7–2.4)

## 2023-12-27 MED ORDER — POTASSIUM CHLORIDE CRYS ER 20 MEQ PO TBCR
40.0000 meq | EXTENDED_RELEASE_TABLET | Freq: Two times a day (BID) | ORAL | Status: AC
  Administered 2023-12-27 (×2): 40 meq via ORAL
  Filled 2023-12-27 (×2): qty 2

## 2023-12-27 MED ORDER — CHLORHEXIDINE GLUCONATE CLOTH 2 % EX PADS: 6.0000 | MEDICATED_PAD | Freq: Every day | CUTANEOUS | Status: DC

## 2023-12-27 MED ORDER — SODIUM CHLORIDE 0.9% FLUSH: 10.0000 mL | INTRAVENOUS | Status: DC | PRN

## 2023-12-27 MED ORDER — IPRATROPIUM-ALBUTEROL 0.5-2.5 (3) MG/3ML IN SOLN
3.0000 mL | RESPIRATORY_TRACT | Status: DC | PRN
  Administered 2023-12-27: 3 mL via RESPIRATORY_TRACT
  Filled 2023-12-27: qty 3

## 2023-12-27 MED ORDER — ENSURE ENLIVE PO LIQD: 237.0000 mL | Freq: Two times a day (BID) | ORAL | Status: DC

## 2023-12-27 MED ORDER — ALUM & MAG HYDROXIDE-SIMETH 200-200-20 MG/5ML PO SUSP
30.0000 mL | ORAL | Status: DC | PRN
  Administered 2023-12-27: 30 mL via ORAL
  Filled 2023-12-27: qty 30

## 2023-12-27 MED ORDER — SODIUM CHLORIDE 0.9% FLUSH
10.0000 mL | Freq: Two times a day (BID) | INTRAVENOUS | Status: DC
  Administered 2023-12-27: 10 mL

## 2023-12-27 MED ORDER — POTASSIUM CHLORIDE 10 MEQ/100ML IV SOLN
10.0000 meq | INTRAVENOUS | Status: AC
Start: 2023-12-27 — End: 2023-12-27
  Administered 2023-12-27 (×4): 10 meq via INTRAVENOUS
  Filled 2023-12-27 (×4): qty 100

## 2023-12-27 MED ORDER — FUROSEMIDE 10 MG/ML IJ SOLN: 20.0000 mg | Freq: Once | INTRAMUSCULAR | Status: DC

## 2023-12-27 NOTE — ED Notes (Signed)
Pt port access came out when he got to the bedside commode. Assisted pt with cleanup, linen and gown changed. Reaccessed port for continued IVF. Pt resting.

## 2023-12-27 NOTE — Progress Notes (Signed)
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  He was recently started on Augmentin on 12/18/2023 for 1 week due to concern for URI.  He completed Augmentin on 12/26/2023.  He has no further respiratory symptoms and his cough has resolved.  His symptoms of diarrhea started on 12/24/2023. He recently had cycle 3 of chemo on 12/18/23.   He underwent workup in the ER and was found to have elevated creatinine, BUN.  CBC also suggested hemoconcentration with elevated hemoglobin. CT abdomen/pelvis was performed which showed diffuse enteritis involving majority of the small bowel and prominent mucosal enhancement of the pylorus suspicious for gastritis.  There was also suspected colitis involving distal descending, sigmoid, and rectosigmoid colon.  Interval History:  Still with ongoing loose stools.  Tolerating some food. Stool studies have returned positive for norovirus.  Assessment and Plan: * Enteritis due to Norovirus - has tolerated chemo previously; developed diarrhea and mild abd pain with some assoc N/V that started on 2/9; has been on augmentin from 2/3 - 2/11 (just completed) -C. difficile negative - GI pathogen panel positive for norovirus, likely explaining CT findings and symptoms -Continue supportive care -IV fluids if needed, continue diet for now  Acute renal failure superimposed on stage 3a chronic kidney disease (HCC) - patient has history of CKD3a. Baseline creat ~ 1.3 - 1.5, eGFR~ 48-50 - patient presents with increase in creat >0.3 mg/dL above baseline or creat increase >1.5x baseline presumed to have occurred within past 7 days PTA - creat 1.85 on admission -Suspect hypovolemic and prerenal in  etiology from nausea, vomiting, diarrhea - Continue fluids - Repeat BMP in a.m.   Hypokalemia - replete as needed  Colitis - see enteritis  Esophageal cancer (HCC) - Follows with Dr. Mosetta Putt -Distal esophageal carcinoma; currently on FOLFOX; last chemo 12/05/23; dose on 2/3 delayed for now  Hypertension - Continue amlodipine - Hold chlorthalidone -PRN meds also    Old records reviewed in assessment of this patient  Antimicrobials:   DVT prophylaxis:  enoxaparin (LOVENOX) injection 40 mg Start: 12/27/23 1000   Code Status:   Code Status: Full Code  Mobility Assessment (Last 72 Hours)     Mobility Assessment   No documentation.           Barriers to discharge: none Disposition Plan:  Home HH orders placed: n/a Status is: Inpt  Objective: Blood pressure (!) 148/95, pulse (!) 105, temperature 99.1 F (37.3 C), temperature source Rectal, resp. rate 17, height 5\' 11"  (1.803 m), weight 77.1 kg, SpO2 94%.  Examination:  Physical Exam Constitutional:      General: He is not in acute distress.    Comments: Lethargic appearing; mildly uncomfortable  HENT:     Head: Normocephalic and atraumatic.     Mouth/Throat:     Mouth: Mucous membranes are moist.  Eyes:     Extraocular Movements: Extraocular movements intact.  Cardiovascular:     Rate and Rhythm: Normal rate and regular rhythm.  Pulmonary:     Effort: Pulmonary effort is normal. No respiratory distress.     Breath sounds: Normal breath sounds. No wheezing.  Abdominal:     General: Bowel sounds are normal. There  is no distension.     Palpations: Abdomen is soft. Mass: mild and nonspecific.     Tenderness: There is abdominal tenderness.  Musculoskeletal:        General: Normal range of motion.     Cervical back: Normal range of motion and neck supple.  Skin:    General: Skin is warm and dry.  Neurological:     General: No focal deficit present.     Mental Status: He is alert.  Psychiatric:         Mood and Affect: Mood normal.        Behavior: Behavior normal.      Consultants:    Procedures:    Data Reviewed: Results for orders placed or performed during the hospital encounter of 12/26/23 (from the past 24 hours)  CBC with Differential     Status: Abnormal   Collection Time: 12/26/23  4:34 PM  Result Value Ref Range   WBC 3.7 (L) 4.0 - 10.5 K/uL   RBC 6.17 (H) 4.22 - 5.81 MIL/uL   Hemoglobin 19.1 (H) 13.0 - 17.0 g/dL   HCT 16.1 (H) 09.6 - 04.5 %   MCV 90.3 80.0 - 100.0 fL   MCH 31.0 26.0 - 34.0 pg   MCHC 34.3 30.0 - 36.0 g/dL   RDW 40.9 81.1 - 91.4 %   Platelets 103 (L) 150 - 400 K/uL   nRBC 1.3 (H) 0.0 - 0.2 %   Neutrophils Relative % 68 %   Neutro Abs 2.6 1.7 - 7.7 K/uL   Lymphocytes Relative 16 %   Lymphs Abs 0.6 (L) 0.7 - 4.0 K/uL   Monocytes Relative 14 %   Monocytes Absolute 0.5 0.1 - 1.0 K/uL   Eosinophils Relative 0 %   Eosinophils Absolute 0.0 0.0 - 0.5 K/uL   Basophils Relative 1 %   Basophils Absolute 0.0 0.0 - 0.1 K/uL   WBC Morphology Mild Left Shift (1-5% metas, occ myelo)    Smear Review PLATELETS APPEAR DECREASED    Immature Granulocytes 1 %   Abs Immature Granulocytes 0.02 0.00 - 0.07 K/uL   Polychromasia PRESENT   Comprehensive metabolic panel     Status: Abnormal   Collection Time: 12/26/23  4:34 PM  Result Value Ref Range   Sodium 136 135 - 145 mmol/L   Potassium 3.0 (L) 3.5 - 5.1 mmol/L   Chloride 92 (L) 98 - 111 mmol/L   CO2 26 22 - 32 mmol/L   Glucose, Bld 165 (H) 70 - 99 mg/dL   BUN 65 (H) 8 - 23 mg/dL   Creatinine, Ser 7.82 (H) 0.61 - 1.24 mg/dL   Calcium 8.7 (L) 8.9 - 10.3 mg/dL   Total Protein 6.6 6.5 - 8.1 g/dL   Albumin 3.1 (L) 3.5 - 5.0 g/dL   AST 29 15 - 41 U/L   ALT 33 0 - 44 U/L   Alkaline Phosphatase 104 38 - 126 U/L   Total Bilirubin 0.6 0.0 - 1.2 mg/dL   GFR, Estimated 39 (L) >60 mL/min   Anion gap 18 (H) 5 - 15  Resp panel by RT-PCR (RSV, Flu A&B, Covid) Anterior Nasal Swab     Status: None   Collection Time:  12/26/23  4:34 PM   Specimen: Anterior Nasal Swab  Result Value Ref Range   SARS Coronavirus 2 by RT PCR NEGATIVE NEGATIVE   Influenza A by PCR NEGATIVE NEGATIVE   Influenza B by PCR NEGATIVE NEGATIVE   Resp Syncytial Virus by  PCR NEGATIVE NEGATIVE  C Difficile Quick Screen w PCR reflex     Status: None   Collection Time: 12/26/23 11:20 PM   Specimen: STOOL  Result Value Ref Range   C Diff antigen NEGATIVE NEGATIVE   C Diff toxin NEGATIVE NEGATIVE   C Diff interpretation No C. difficile detected.   Basic metabolic panel     Status: Abnormal   Collection Time: 12/27/23  5:27 AM  Result Value Ref Range   Sodium 129 (L) 135 - 145 mmol/L   Potassium 2.3 (LL) 3.5 - 5.1 mmol/L   Chloride 96 (L) 98 - 111 mmol/L   CO2 23 22 - 32 mmol/L   Glucose, Bld 159 (H) 70 - 99 mg/dL   BUN 67 (H) 8 - 23 mg/dL   Creatinine, Ser 4.74 (H) 0.61 - 1.24 mg/dL   Calcium 7.5 (L) 8.9 - 10.3 mg/dL   GFR, Estimated 50 (L) >60 mL/min   Anion gap 10 5 - 15  CBC with Differential/Platelet     Status: Abnormal   Collection Time: 12/27/23  5:27 AM  Result Value Ref Range   WBC 3.4 (L) 4.0 - 10.5 K/uL   RBC 5.36 4.22 - 5.81 MIL/uL   Hemoglobin 16.7 13.0 - 17.0 g/dL   HCT 25.9 56.3 - 87.5 %   MCV 89.7 80.0 - 100.0 fL   MCH 31.2 26.0 - 34.0 pg   MCHC 34.7 30.0 - 36.0 g/dL   RDW 64.3 32.9 - 51.8 %   Platelets 78 (L) 150 - 400 K/uL   nRBC 0.9 (H) 0.0 - 0.2 %   Neutrophils Relative % 71 %   Neutro Abs 2.5 1.7 - 7.7 K/uL   Lymphocytes Relative 11 %   Lymphs Abs 0.4 (L) 0.7 - 4.0 K/uL   Monocytes Relative 16 %   Monocytes Absolute 0.5 0.1 - 1.0 K/uL   Eosinophils Relative 0 %   Eosinophils Absolute 0.0 0.0 - 0.5 K/uL   Basophils Relative 1 %   Basophils Absolute 0.0 0.0 - 0.1 K/uL   WBC Morphology INCREASED BANDS (>20% BANDS)    Immature Granulocytes 1 %   Abs Immature Granulocytes 0.03 0.00 - 0.07 K/uL  Magnesium     Status: None   Collection Time: 12/27/23  5:27 AM  Result Value Ref Range   Magnesium  1.8 1.7 - 2.4 mg/dL  Gastrointestinal Panel by PCR , Stool     Status: Abnormal   Collection Time: 12/27/23  5:34 AM   Specimen: STOOL  Result Value Ref Range   Campylobacter species NOT DETECTED NOT DETECTED   Plesimonas shigelloides NOT DETECTED NOT DETECTED   Salmonella species NOT DETECTED NOT DETECTED   Yersinia enterocolitica NOT DETECTED NOT DETECTED   Vibrio species NOT DETECTED NOT DETECTED   Vibrio cholerae NOT DETECTED NOT DETECTED   Enteroaggregative E coli (EAEC) NOT DETECTED NOT DETECTED   Enteropathogenic E coli (EPEC) NOT DETECTED NOT DETECTED   Enterotoxigenic E coli (ETEC) NOT DETECTED NOT DETECTED   Shiga like toxin producing E coli (STEC) NOT DETECTED NOT DETECTED   Shigella/Enteroinvasive E coli (EIEC) NOT DETECTED NOT DETECTED   Cryptosporidium NOT DETECTED NOT DETECTED   Cyclospora cayetanensis NOT DETECTED NOT DETECTED   Entamoeba histolytica NOT DETECTED NOT DETECTED   Giardia lamblia NOT DETECTED NOT DETECTED   Adenovirus F40/41 NOT DETECTED NOT DETECTED   Astrovirus NOT DETECTED NOT DETECTED   Norovirus GI/GII DETECTED (A) NOT DETECTED   Rotavirus A NOT DETECTED NOT DETECTED  Sapovirus (I, II, IV, and V) NOT DETECTED NOT DETECTED    I have reviewed pertinent nursing notes, vitals, labs, and images as necessary. I have ordered labwork to follow up on as indicated.  I have reviewed the last notes from staff over past 24 hours. I have discussed patient's care plan and test results with nursing staff, CM/SW, and other staff as appropriate.  Time spent: Greater than 50% of the 55 minute visit was spent in counseling/coordination of care for the patient as laid out in the A&P.   LOS: 1 day   Lewie Chamber, MD [facility] 12/27/2023, 2:58 PM

## 2023-12-27 NOTE — Progress Notes (Incomplete)
    Patient Name: Jordan [name]           DOB: 10-18-1956  MRN: [id number]      Admission Date: 12/26/2023  Attending Provider: Lewie Chamber, MD  Primary Diagnosis: Enteritis due to Norovirus   Level of care: Progressive    CROSS COVER NOTE   Date of Service   12/27/2023   Jordan [name], 68 y.o. male, was admitted on 12/26/2023 for Enteritis due to Norovirus.    HPI/Events of Note   RN concerned for respiratory changes --> Dyspnea, increased work of breathing, mildly hypoxic Mid 80's, now requiring high levels of oxygen.  Went from RA to requiring 15 L. Recently had URI, off abx now.    On assessment, patient is significantly rhonchorous.  Unable to communicate with full sentences given dyspnea.  Remains alert and oriented, requesting to be sat at the edge of the bed. Patient denies any form of aspiration, chocking. Had episode of emesis per RN in the ED.   Plan:  X-ray  Nebulizers as needed Hold IVF until Xray is done  RN reports no improvement after increasing oxygen, nebulizers, or morphine.  Working significantly hard to breathe, will try BiPAP, obtain ABG, D-dimer, BNP. Consider CT    Bedside Assessment:  Patient is awake, A/O x2, in respiratory distress.  Respiratory: Bilaterally rhonchus.  Labored breathing.  Abdominal accessory muscle use Cardiovascular: Regular rhythm.  Tachycardic no extremity edema. 2+ pedal pulses.  Abdomen: Abdomen distended, non tender.  Positive bowel sounds in all quadrants.    Addendum: 0000- Pt now altered. I saw him 10 minutes ago and is now awake, but less verbal and has delay in following commands. RN witnessed aspiration. Moving to ICU, place NG to suction, start abx. Repeat Chest xray. KUB. CBC, CMP.   Consult PCCM after stabilizing in the ICU  0030- Report of bradycardia. At bedside again, Elink MD on the monitor. Pt is now in PEA, Code Blue activated. ACLS followed. ROSC after 2 rounds of epi. EDP at bedside, getting ready  to intubate. PCCM has been formally called.   42- Mother has been contacted, she will be coming to see pt tonight.   4098- time of death,  family at bedside.    Interventions/ Plan   Above        Anthoney Harada, DNP, [facility]- [facility] [place]

## 2023-12-28 ENCOUNTER — Inpatient Hospital Stay (HOSPITAL_COMMUNITY): Payer: 59

## 2023-12-28 DIAGNOSIS — J9601 Acute respiratory failure with hypoxia: Secondary | ICD-10-CM

## 2023-12-28 DIAGNOSIS — R571 Hypovolemic shock: Secondary | ICD-10-CM

## 2023-12-28 DIAGNOSIS — K922 Gastrointestinal hemorrhage, unspecified: Secondary | ICD-10-CM

## 2023-12-28 DIAGNOSIS — I469 Cardiac arrest, cause unspecified: Secondary | ICD-10-CM

## 2023-12-28 DIAGNOSIS — A0811 Acute gastroenteropathy due to Norwalk agent: Secondary | ICD-10-CM | POA: Diagnosis not present

## 2023-12-28 LAB — CBC WITH DIFFERENTIAL/PLATELET
Abs Immature Granulocytes: 0.07 10*3/uL (ref 0.00–0.07)
Basophils Absolute: 0 10*3/uL (ref 0.0–0.1)
Basophils Relative: 1 %
Eosinophils Absolute: 0 10*3/uL (ref 0.0–0.5)
Eosinophils Relative: 1 %
HCT: 42.2 % (ref 39.0–52.0)
Hemoglobin: 13.4 g/dL (ref 13.0–17.0)
Immature Granulocytes: 5 %
Lymphocytes Relative: 42 %
Lymphs Abs: 0.7 10*3/uL (ref 0.7–4.0)
MCH: 30.9 pg (ref 26.0–34.0)
MCHC: 31.8 g/dL (ref 30.0–36.0)
MCV: 97.5 fL (ref 80.0–100.0)
Monocytes Absolute: 0.1 10*3/uL (ref 0.1–1.0)
Monocytes Relative: 5 %
Neutro Abs: 0.8 10*3/uL — ABNORMAL LOW (ref 1.7–7.7)
Neutrophils Relative %: 46 %
Platelets: 59 10*3/uL — ABNORMAL LOW (ref 150–400)
RBC: 4.33 MIL/uL (ref 4.22–5.81)
RDW: 14.4 % (ref 11.5–15.5)
WBC Morphology: >10
WBC: 1.6 10*3/uL — ABNORMAL LOW (ref 4.0–10.5)
nRBC: 21.7 % — ABNORMAL HIGH (ref 0.0–0.2)

## 2023-12-28 LAB — DIC (DISSEMINATED INTRAVASCULAR COAGULATION)PANEL
D-Dimer, Quant: >20 ug{FEU}/mL — ABNORMAL HIGH (ref 0.00–0.50)
Fibrinogen: 318 mg/dL (ref 210–475)
INR: 1.7 — ABNORMAL HIGH (ref 0.8–1.2)
Platelets: 61 10*3/uL — ABNORMAL LOW (ref 150–400)
Prothrombin Time: 20.2 s — ABNORMAL HIGH (ref 11.4–15.2)
Smear Review: NONE SEEN
aPTT: 43 s — ABNORMAL HIGH (ref 24–36)

## 2023-12-28 LAB — COMPREHENSIVE METABOLIC PANEL
ALT: 41 U/L (ref 0–44)
AST: 68 U/L — ABNORMAL HIGH (ref 15–41)
Albumin: 1.8 g/dL — ABNORMAL LOW (ref 3.5–5.0)
Alkaline Phosphatase: 63 U/L (ref 38–126)
Anion gap: 19 — ABNORMAL HIGH (ref 5–15)
BUN: 71 mg/dL — ABNORMAL HIGH (ref 8–23)
CO2: 15 mmol/L — ABNORMAL LOW (ref 22–32)
Calcium: 10.8 mg/dL — ABNORMAL HIGH (ref 8.9–10.3)
Chloride: 100 mmol/L (ref 98–111)
Creatinine, Ser: 2.79 mg/dL — ABNORMAL HIGH (ref 0.61–1.24)
GFR, Estimated: 24 mL/min — ABNORMAL LOW (ref >60)
Glucose, Bld: 173 mg/dL — ABNORMAL HIGH (ref 70–99)
Potassium: 3.4 mmol/L — ABNORMAL LOW (ref 3.5–5.1)
Sodium: 134 mmol/L — ABNORMAL LOW (ref 135–145)
Total Bilirubin: 0.7 mg/dL (ref 0.0–1.2)
Total Protein: 4.1 g/dL — ABNORMAL LOW (ref 6.5–8.1)

## 2023-12-28 LAB — BLOOD GAS, ARTERIAL
Drawn by: 23281
FIO2: 100 %
MECHVT: 550 mL
O2 Saturation: 82.3 %
PEEP: 10 cmH2O
Patient temperature: 37
RATE: 18 {breaths}/min
pCO2 arterial: 79 mm[Hg] (ref 32–48)
pH, Arterial: <6.95 — CL (ref 7.35–7.45)
pO2, Arterial: 68 mm[Hg] — ABNORMAL LOW (ref 83–108)

## 2023-12-28 LAB — BRAIN NATRIURETIC PEPTIDE: B Natriuretic Peptide: 465.5 pg/mL — ABNORMAL HIGH (ref 0.0–100.0)

## 2023-12-28 LAB — LACTIC ACID, PLASMA: Lactic Acid, Venous: >9 mmol/L (ref 0.5–1.9)

## 2023-12-28 LAB — GLUCOSE, CAPILLARY: Glucose-Capillary: 163 mg/dL — ABNORMAL HIGH (ref 70–99)

## 2023-12-28 LAB — TROPONIN I (HIGH SENSITIVITY): Troponin I (High Sensitivity): 69 ng/L — ABNORMAL HIGH (ref <18)

## 2023-12-28 MED ORDER — ORAL CARE MOUTH RINSE: 15.0000 mL | OROMUCOSAL | Status: DC | PRN

## 2023-12-28 MED ORDER — POTASSIUM CHLORIDE 10 MEQ/50ML IV SOLN: 10.0000 meq | INTRAVENOUS | Status: DC

## 2023-12-28 MED ORDER — NOREPINEPHRINE 4 MG/250ML-% IV SOLN
INTRAVENOUS | Status: AC
  Filled 2023-12-28: qty 250

## 2023-12-28 MED ORDER — FENTANYL CITRATE PF 50 MCG/ML IJ SOSY: 50.0000 ug | PREFILLED_SYRINGE | INTRAMUSCULAR | Status: DC | PRN

## 2023-12-28 MED ORDER — PROPOFOL 1000 MG/100ML IV EMUL: 5.0000 ug/kg/min | INTRAVENOUS | Status: DC

## 2023-12-28 MED ORDER — EPINEPHRINE HCL 5 MG/250ML IV SOLN IN NS
0.5000 ug/min | INTRAVENOUS | Status: DC
  Administered 2023-12-28: 0.5 ug/min via INTRAVENOUS
  Filled 2023-12-28: qty 250

## 2023-12-28 MED ORDER — NOREPINEPHRINE 4 MG/250ML-% IV SOLN: 0.0000 ug/min | INTRAVENOUS | Status: DC

## 2023-12-28 MED ORDER — FAMOTIDINE 20 MG PO TABS: 20.0000 mg | ORAL_TABLET | Freq: Two times a day (BID) | ORAL | Status: DC

## 2023-12-28 MED ORDER — ORAL CARE MOUTH RINSE: 15.0000 mL | OROMUCOSAL | Status: DC

## 2023-12-28 MED ORDER — PIPERACILLIN-TAZOBACTAM 3.375 G IVPB: 3.3750 g | Freq: Three times a day (TID) | INTRAVENOUS | Status: DC

## 2023-12-28 MED FILL — Medication: Qty: 1 | Status: AC

## 2024-01-03 ENCOUNTER — Inpatient Hospital Stay: Payer: 59 | Admitting: Hematology

## 2024-01-03 ENCOUNTER — Inpatient Hospital Stay: Payer: 59

## 2024-01-03 ENCOUNTER — Inpatient Hospital Stay: Payer: 59 | Admitting: Dietician

## 2024-01-05 ENCOUNTER — Inpatient Hospital Stay: Payer: 59

## 2024-01-13 NOTE — Significant Event (Signed)
Rapid Response Event Note  Responded to CODE BLUE event, patient had pulse with poor respirations, transferred immediately to 1234 and lost pulse. CODE BLUE x 2. Please refer to physician documentation. Pronounced at 0220.  Lamona Curl, RN

## 2024-01-13 NOTE — Code Documentation (Signed)
CODE BLUE: Severe sinus bradycardia (30) and code was initiated, 2 epi rounds before having sinus tachycardia. Epi drip was started but he continued to be hypotensive. Epi drip was increased to 20 mcg/min and Levophed was increased to 50 mcg/min, BP 50/20. (Please code sheet)

## 2024-01-13 NOTE — Progress Notes (Signed)
Abg drawn from aline and sample sent to lab for processing.  Katy in lab notified.

## 2024-01-13 NOTE — Progress Notes (Signed)
MD at bedside spoke with mother and brother concerning patients condition.  Mom agreed to stop life support measures and allow her son to pass peacefully.

## 2024-01-13 NOTE — Progress Notes (Signed)
eLink Physician-Brief Progress Note Patient Name: TRE SANKER DOB: 14-Jul-1956 MRN: [id number]   Date of Service  12/21/2023  HPI/Events of Note  68 year old male  with a history of esophageal cancer on FOLFOX and a history of colon cancer who initially presented with diarrhea and weakness found to have severe hypokalemia and norovirus infection.  He had reduced level of consciousness of this referred to critical care for further management.  Upon arrival to the intensive care unit he was noted to be pulseless.  Excellent compressions were started immediately by bedside staff.  Patient was placed on the monitor with asystole and then PEA.  Received 2 rounds of epinephrine, magnesium, calcium, and was promptly intubated by the ER physician.  The patient had return of spontaneous circulation but developed severe shock requiring escalating doses of vasopressors.  These were titrated verbally with the bedside staff.   At that point, the ground team arrived at bedside and was able to assume care.  eICU Interventions  Post ROSC labs, chest radiograph, and EKG have been ordered.  Additional KCl has been ordered.  Will await complete ground team evaluation for reassessment.     Intervention Category Major Interventions: Code management / supervision Evaluation Type: New Patient Evaluation  Legion Discher 01/12/2024, 12:59 AM

## 2024-01-13 NOTE — Progress Notes (Signed)
Pharmacy Antibiotic Note  Jordan [name] is a 68 y.o. male admitted on 12/26/2023 with aspiration pneumonia.  Pharmacy has been consulted for Zosyn dosing.  Plan: Zosyn 3.375g IV q8h (4 hour infusion). No dose adjustments anticipated.  Pharmacy will sign off and monitor peripherally via electronic surveillance software for any changes in renal function or micro data.   Height: 5\' 11"  (180.3 cm) Weight: 77.1 kg (170 lb) IBW/kg (Calculated) : 75.3  Temp (24hrs), Avg:97.8 F (36.6 C), Min:97.8 F (36.6 C), Max:97.8 F (36.6 C)  Recent Labs  Lab 12/26/23 1634 12/27/23 0527  WBC 3.7* 3.4*  CREATININE 1.85* 1.51*    Estimated Creatinine Clearance: 49.9 mL/min (A) (by C-G formula based on SCr of 1.51 mg/dL (H)).    No Known Allergies   Thank you for allowing pharmacy to be a part of this patient's care.  Junita Push PharmD 12/20/2023 12:14 AM

## 2024-01-13 NOTE — Procedures (Signed)
Arterial Catheter Insertion Procedure Note  Jordan [name]  [id number]  [birth year]  Date:12/21/2023  Time:2:16 AM    Provider Performing: Patrici Ranks    Procedure: Insertion of Arterial Line (53614) with US guidance (43154)   Indication(s) Blood pressure monitoring and/or need for frequent ABGs  Consent Unable to obtain consent due to emergent nature of procedure.  Anesthesia None   Time Out Verified patient identification, verified procedure, site/side was marked, verified correct patient position, special equipment/implants available, medications/allergies/relevant history reviewed, required imaging and test results available.   Sterile Technique Maximal sterile technique including full sterile barrier drape, hand hygiene, sterile gown, sterile gloves, mask, hair covering, sterile ultrasound probe cover (if used).   Procedure Description Area of catheter insertion was cleaned with chlorhexidine and draped in sterile fashion. With real-time ultrasound guidance an arterial catheter was placed into the left femoral artery.  Appropriate arterial tracings confirmed on monitor.     Complications/Tolerance None; patient tolerated the procedure well.   EBL Minimal   Specimen(s) None

## 2024-01-13 NOTE — Death Summary Note (Signed)
DEATH SUMMARY   Patient Details  Name: Jordan [name] MRN: [id number] DOB: Mar 29, 1956  Admission/Discharge Information   Admit Date:  2023/12/27  Date of Death:    Time of Death:    Length of Stay: 2  Referring Physician: Storm Frisk, MD   Reason(s) for Hospitalization  Diarrhea AKI Enteritis and colitis, due to Norovirus infection Hypokalemia Dehydration  Diagnoses  Preliminary cause of death:  Septic and hemorrhagic shock  UGIB, possible bowel infraction Several lactic acidosis (AGMA) Acute hypoxic resp failure due to shock and aspiration  Secondary Diagnoses (including complications and co-morbidities):  Principal Problem:   Enteritis due to Norovirus Active Problems:   Hypertension   Esophageal cancer (HCC)   Colitis   Hypokalemia   Acute renal failure superimposed on stage 3a chronic kidney disease (HCC) Esophageal cancer on ChemoRx, Dec 18, 2023 Norovirus enteritis/colitis/gastritis      Brief Hospital Course (including significant findings, care, treatment, and services provided and events leading to death)  Jordan [name] is a 68 y.o. 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  He was recently started on Augmentin on 12/18/2023 for 1 week due to concern for URI.  He completed Augmentin on 2023-12-27.  He has no further respiratory symptoms and his cough has resolved.  His symptoms of diarrhea started on 12/24/2023. He recently had cycle 3 of chemo on 12/18/23.   He underwent workup in the ER and was found to have elevated creatinine, BUN.  CBC also suggested hemoconcentration with elevated hemoglobin. CT abdomen/pelvis was performed which showed diffuse enteritis involving majority of the small bowel and prominent mucosal enhancement of the pylorus suspicious for gastritis.  There was also suspected colitis involving distal descending, sigmoid, and rectosigmoid colon. The  day of death, he developed aspiration and hypoxia, leaded to first code blue, intubated, shock, and started on Levophed. Then he developed UGIB, worsening shock, probably combined septic and hemorrhagic shock, and second code blue.  Pertinent Labs and Studies  Significant Diagnostic Studies DG Chest Port 1 View Result Date: 01/06/2024 CLINICAL DATA:  ET tube, aspiration EXAM: PORTABLE CHEST 1 VIEW COMPARISON:  12/27/2023 FINDINGS: Right Port-A-Cath remains in place, unchanged. Endotracheal tube tip is 1.5 cm above the carina. NG tube enters the stomach. Heart and mediastinal contours within normal limits. No confluent airspace opacities, effusions or pneumothorax. No acute bony abnormality. IMPRESSION: Endotracheal tube just above the carina. This could be retracted 2 cm for optimal positioning. No acute cardiopulmonary disease. Electronically Signed   By: Charlett Nose M.D.   On: 01/02/2024 01:15   DG Chest Port 1 View Result Date: 12/27/2023 CLINICAL DATA:  Short of breath, hypoxia, history of esophageal cancer EXAM: PORTABLE CHEST 1 VIEW COMPARISON:  10/10/2023 FINDINGS: Single frontal view of the chest demonstrates right chest wall port tip overlying superior vena cava. The cardiac silhouette is unremarkable. No acute airspace disease, effusion, or pneumothorax. No acute bony abnormalities. IMPRESSION: 1. No acute intrathoracic process. Electronically Signed   By: Sharlet Salina M.D.   On: 12/27/2023 22:32   CT ABDOMEN PELVIS W CONTRAST Result Date: 27-Dec-2023 CLINICAL DATA:  Abdomen pain diarrhea EXAM: CT ABDOMEN AND PELVIS WITH CONTRAST TECHNIQUE: Multidetector CT imaging of the abdomen and pelvis was performed using the standard protocol following bolus administration of intravenous contrast. RADIATION DOSE REDUCTION: This exam was performed according to the departmental dose-optimization program which includes automated exposure control, adjustment of the  mA and/or kV according to patient size  and/or use of iterative reconstruction technique. CONTRAST:  80mL OMNIPAQUE IOHEXOL 300 MG/ML  SOLN COMPARISON:  CT 10/10/2023, PET CT 07/24/2023, CT abdomen pelvis 04/03/2020 FINDINGS: Lower chest: Lung bases demonstrate no acute airspace disease. Bandlike scarring in the right base. Prominent circumferential distal esophageal thickening with mucosal enhancement incompletely visualized with fluid in the distal esophagus. Hepatobiliary: No focal liver abnormality is seen. No gallstones, gallbladder wall thickening, or biliary dilatation. Pancreas: Unremarkable. No pancreatic ductal dilatation or surrounding inflammatory changes. Spleen: Normal in size without focal abnormality. Adrenals/Urinary Tract: Adrenal glands are normal. Atrophic right kidney. Multifocal cortical hypodensity and volume loss consistent with scarring. No hydronephrosis. The bladder is unremarkable Stomach/Bowel: Marked fluid distension of the stomach. Prominent mucosal enhancement of the pylorus. Diffuse mucosal enhancement of the duodenum, jejunum and majority of small bowel with thickened appearing small bowel suggesting enteritis. Mild fluid in the colon. Probable mild colon wall thickening involving the distal descending and sigmoid/rectosigmoid colon. Vascular/Lymphatic: Advanced aortic atherosclerosis. Infrarenal abdominal aortic aneurysm measuring up to 4.3 cm with considerable mural thrombus. No suspicious lymph nodes. Reproductive: Prostate is unremarkable. Other: Negative for pelvic effusion or free air. Musculoskeletal: No acute or suspicious osseous abnormality IMPRESSION: 1. Findings consistent with diffuse enteritis involving the majority of small bowel. Prominent mucosal enhancement of the pylorus suspicious for gastritis. Suspected colitis type changes of the distal descending, sigmoid and rectosigmoid colon. Mild fluid in the colon corresponding to history of diarrhea 2. Marked circumferential wall thickening of the distal  esophagus incompletely assessed with mucosal enhancement presumably corresponding to the history of esophageal malignancy. Considerable fluid distension of the stomach, consider NG decompression 3. Infrarenal abdominal aortic aneurysm measuring up to 4.3 cm. Recommend follow-up CT or MR as appropriate in 12 months and referral to or continued care with vascular specialist. (Ref.: J Vasc Surg. 2018; 67:2-77 and J Am Coll Radiol 2013;10(10):789-794.) . 4. Atrophic right kidney with multifocal cortical scarring in the right greater than left kidney. 5. Aortic atherosclerosis. Aortic Atherosclerosis (ICD10-I70.0). Electronically Signed   By: Jasmine Pang M.D.   On: 12/26/2023 19:30    Microbiology Recent Results (from the past 240 hours)  Resp panel by RT-PCR (RSV, Flu A&B, Covid) Anterior Nasal Swab     Status: None   Collection Time: 12/26/23  4:34 PM   Specimen: Anterior Nasal Swab  Result Value Ref Range Status   SARS Coronavirus 2 by RT PCR NEGATIVE NEGATIVE Final    Comment: (NOTE) SARS-CoV-2 target nucleic acids are NOT DETECTED.  The SARS-CoV-2 RNA is generally detectable in upper respiratory specimens during the acute phase of infection. The lowest concentration of SARS-CoV-2 viral copies this assay can detect is 138 copies/mL. A negative result does not preclude SARS-Cov-2 infection and should not be used as the sole basis for treatment or other patient management decisions. A negative result may occur with  improper specimen collection/handling, submission of specimen other than nasopharyngeal swab, presence of viral mutation(s) within the areas targeted by this assay, and inadequate number of viral copies(<138 copies/mL). A negative result must be combined with clinical observations, patient history, and epidemiological information. The expected result is Negative.  Fact Sheet for Patients:  BloggerCourse.com  Fact Sheet for Healthcare Providers:   SeriousBroker.it  This test is no t yet approved or cleared by the [place] FDA and  has been authorized for detection and/or diagnosis of SARS-CoV-2 by FDA under an Emergency Use Authorization (EUA). This EUA will remain  in effect (meaning this test can be used) for the duration of the COVID-19 declaration under Section 564(b)(1) of the Act, 21 U.S.C.section 360bbb-3(b)(1), unless the authorization is terminated  or revoked sooner.       Influenza A by PCR NEGATIVE NEGATIVE Final   Influenza B by PCR NEGATIVE NEGATIVE Final    Comment: (NOTE) The Xpert Xpress SARS-CoV-2/FLU/RSV plus assay is intended as an aid in the diagnosis of influenza from Nasopharyngeal swab specimens and should not be used as a sole basis for treatment. Nasal washings and aspirates are unacceptable for Xpert Xpress SARS-CoV-2/FLU/RSV testing.  Fact Sheet for Patients: BloggerCourse.com  Fact Sheet for Healthcare Providers: SeriousBroker.it  This test is not yet approved or cleared by the [place] FDA and has been authorized for detection and/or diagnosis of SARS-CoV-2 by FDA under an Emergency Use Authorization (EUA). This EUA will remain in effect (meaning this test can be used) for the duration of the COVID-19 declaration under Section 564(b)(1) of the Act, 21 U.S.C. section 360bbb-3(b)(1), unless the authorization is terminated or revoked.     Resp Syncytial Virus by PCR NEGATIVE NEGATIVE Final    Comment: (NOTE) Fact Sheet for Patients: BloggerCourse.com  Fact Sheet for Healthcare Providers: SeriousBroker.it  This test is not yet approved or cleared by the [place] FDA and has been authorized for detection and/or diagnosis of SARS-CoV-2 by FDA under an Emergency Use Authorization (EUA). This EUA will remain in effect (meaning this test can be used) for  the duration of the COVID-19 declaration under Section 564(b)(1) of the Act, 21 U.S.C. section 360bbb-3(b)(1), unless the authorization is terminated or revoked.  Performed at [facility], [address] [place], [place] [address]   C Difficile Quick Screen w PCR reflex     Status: None   Collection Time: 12/26/23 11:20 PM   Specimen: STOOL  Result Value Ref Range Status   C Diff antigen NEGATIVE NEGATIVE Final   C Diff toxin NEGATIVE NEGATIVE Final   C Diff interpretation No C. difficile detected.  Final    Comment: Performed at [facility], [address] [place], [place] [address]  Gastrointestinal Panel by PCR , Stool     Status: Abnormal   Collection Time: 12/27/23  5:34 AM   Specimen: STOOL  Result Value Ref Range Status   Campylobacter species NOT DETECTED NOT DETECTED Final   Plesimonas shigelloides NOT DETECTED NOT DETECTED Final   Salmonella species NOT DETECTED NOT DETECTED Final   Yersinia enterocolitica NOT DETECTED NOT DETECTED Final   Vibrio species NOT DETECTED NOT DETECTED Final   Vibrio cholerae NOT DETECTED NOT DETECTED Final   Enteroaggregative E coli (EAEC) NOT DETECTED NOT DETECTED Final   Enteropathogenic E coli (EPEC) NOT DETECTED NOT DETECTED Final   Enterotoxigenic E coli (ETEC) NOT DETECTED NOT DETECTED Final   Shiga like toxin producing E coli (STEC) NOT DETECTED NOT DETECTED Final   Shigella/Enteroinvasive E coli (EIEC) NOT DETECTED NOT DETECTED Final   Cryptosporidium NOT DETECTED NOT DETECTED Final   Cyclospora cayetanensis NOT DETECTED NOT DETECTED Final   Entamoeba histolytica NOT DETECTED NOT DETECTED Final   Giardia lamblia NOT DETECTED NOT DETECTED Final   Adenovirus F40/41 NOT DETECTED NOT DETECTED Final   Astrovirus NOT DETECTED NOT DETECTED Final   Norovirus GI/GII DETECTED (A) NOT DETECTED Final    Comment: RESULT CALLED TO, READ BACK BY AND VERIFIED WITH: Felipa Furnace 1045 12/27/2023 LRL     Rotavirus A NOT  DETECTED NOT DETECTED Final   Sapovirus (I, II, IV, and V) NOT DETECTED NOT DETECTED Final    Comment: Performed at [facility], [address] [place] [place], [place] [address]    Lab Basic Metabolic Panel: Recent Labs  Lab 12/26/23 1634 12/27/23 0527 12/19/2023 0059  NA 136 129* 134*  K 3.0* 2.3* 3.4*  CL 92* 96* 100  CO2 26 23 15*  GLUCOSE 165* 159* 173*  BUN 65* 67* 71*  CREATININE 1.85* 1.51* 2.79*  CALCIUM 8.7* 7.5* 10.8*  MG  --  1.8  --    Liver Function Tests: Recent Labs  Lab 12/26/23 1634 01/01/2024 0059  AST 29 68*  ALT 33 41  ALKPHOS 104 63  BILITOT 0.6 0.7  PROT 6.6 4.1*  ALBUMIN 3.1* 1.8*   No results for input(s): "LIPASE", "AMYLASE" in the last 168 hours. No results for input(s): "AMMONIA" in the last 168 hours. CBC: Recent Labs  Lab 12/26/23 1634 12/27/23 0527 01/09/2024 0059 12/18/2023 0102  WBC 3.7* 3.4* 1.6*  --   NEUTROABS 2.6 2.5 0.8*  --   HGB 19.1* 16.7 13.4  --   HCT 55.7* 48.1 42.2  --   MCV 90.3 89.7 97.5  --   PLT 103* 78* 59* 61*   Cardiac Enzymes: No results for input(s): "CKTOTAL", "CKMB", "CKMBINDEX", "TROPONINI" in the last 168 hours. Sepsis Labs: Recent Labs  Lab 12/26/23 1634 12/27/23 0527 12/29/2023 0059 01/09/2024 0102  WBC 3.7* 3.4* 1.6*  --   LATICACIDVEN  --   --   --  >9.0*    Procedures/Operations  Left femoral a-line IO left tibia Intubation  Flonnie Hailstone Semir Brill 12/19/2023, 2:21 AM

## 2024-01-13 NOTE — ED Provider Notes (Signed)
Called to bedside for CODE BLUE.  CPR in progress and resuscitation being led by intensivist.  After return of circulation patient was intubated.  Patient being supported with BVM ventilation.  Breathing spontaneously but not enough for respiratory support.  There was evidence of frank aspiration prior to intubation.  Manson Passey material was suctioned from oropharynx.  Patient did not require medications for intubation.  Postintubation chest x-ray to be followed up by intensivist. Procedure Name: Intubation Date/Time: 12/31/2023 12:50 AM  Performed by: Tilden Fossa, MDPre-anesthesia Checklist: Patient identified, Patient being monitored, Emergency Drugs available, Timeout performed and Suction available Oxygen Delivery Method: Ambu bag Preoxygenation: Pre-oxygenation with 100% oxygen Ventilation: Two handed mask ventilation required and Mask ventilation with difficulty Laryngoscope Size: Glidescope Tube size: 7.5 mm Number of attempts: 1 Airway Equipment and Method: Video-laryngoscopy Placement Confirmation: ETT inserted through vocal cords under direct vision, CO2 detector and Breath sounds checked- equal and bilateral Secured at: 26 cm Tube secured with: Suzan Slick, MD 12/29/2023 [phone]

## 2024-01-13 NOTE — Consult Note (Addendum)
I was called to assess the patient while he was in first code: PEA and ROSC after 4 epi rounds. He was intubated by the ED MD with ETT 7.5 @26  cm at the teeth. PCXR showed deep ETT and it was pulled to 24 cm. PRVC 20/550/+10/100%. ABG <6.95, 79, 68, SpO2 82% On Levophed 20 mcg/min He had left tibial IO and por-a-cath and two PIV Left femoral a-line was inserted Immediately after the a-line insertion, he had sinus bradycardia and code was initiated, 2 epi rounds before having sinus tachycardia. Epi drip was started but he continued to be hypotensive. Epi drip was increased to 20 mcg/min and Levophed was increased to 50 mcg/min, BP 50/20. Continued to have slow dropping HR. (Please code sheet) LA >9 D-Dimer >20 micrograms/mL INR 1.7  Bedside focus Echo: barely heart movements PE: showed NGT: coffee ground output and he vomited coffee ground material  Rectal tube: melena Pupils are mid size, not reactive Lungs: diffuse crackles and poor air entry bilaterally Heart: distant heart sounds Abd: distended and firm. No BM. Foley cath was inserted: minimal UOP LL: mottled and poor perfusion Labs and images: reviewed Assessment:  Septic and hemorrhagic shock  UGIB, possible bowel infraction Esophageal cancer on ChemoRx, Dec 18, 2023 Norovirus enteritis/colitis/gastritis     HTN GERD Colon Ca AKI/CKD-3a HypoK Several lactic acidosis (AGMA) Acute hypoxic resp failure due to shock and aspiration  PLAN: D/w mother and son at the bedside, who would like to withdraw care  CCM excluding running the CODE and a-line insertion 45 min

## 2024-01-13 NOTE — Progress Notes (Signed)
Pt was transported from 4W due to desaturation and code called.  Intubated on unit placed on vent.  MD placing a line at this time.

## 2024-01-13 NOTE — Progress Notes (Incomplete)
    Patient Name: Jordan [name]           DOB: 15-Jan-1956  MRN: [id number]      Admission Date: 12/26/2023  Attending Provider: Lewie Chamber, MD  Primary Diagnosis: Enteritis due to Norovirus   Level of care: Progressive    CROSS COVER NOTE   Date of Service   12/27/2023   Jordan [name], 68 y.o. male, was admitted on 12/26/2023 for Enteritis due to Norovirus.    HPI/Events of Note   Dyspnea, increased work of breathing, rapid increasing oxygen tonight.  Went from room air to now requiring 15 L. Difficultly rhonchus RN reports no improvement after increasing oxygen, nebulizers, or morphine Currently receiving IV fluid, placing fluids on hold until x-ray is complete. Patient denies any form of aspiration  X-ray ordered Nebulizers as needed Working significantly hard to breathe, will try BiPAP, obtain ABG, D-dimer, consider CT   Bedside Assessment:  Patient is awake, A/O x2, appears to be in respiratory distress.  Respiratory: Bilaterally rhonchus.  Labored breathing.  Abdominal accessory muscle use Cardiovascular: Regular rhythm.  Tachycardic no extremity edema. 2+ pedal pulses.  Abdomen: Abdomen distended, non tender, soft.  Positive bowel sounds in all quadrants.    Addendum:  2350-Per RN, patient became suddenly unresponsive and unable to follow commands    Interventions/ Plan   X        Anthoney Harada, DNP, ACNPC- [facility] [place]

## 2024-01-13 DEATH — deceased

## 2024-01-17 ENCOUNTER — Ambulatory Visit: Payer: 59 | Admitting: Nurse Practitioner

## 2024-01-17 ENCOUNTER — Ambulatory Visit: Payer: 59

## 2024-01-17 ENCOUNTER — Other Ambulatory Visit: Payer: 59

## 2024-01-31 ENCOUNTER — Ambulatory Visit: Payer: 59

## 2024-01-31 ENCOUNTER — Ambulatory Visit: Payer: 59 | Admitting: Hematology

## 2024-01-31 ENCOUNTER — Other Ambulatory Visit: Payer: 59

## 2024-02-02 ENCOUNTER — Inpatient Hospital Stay: Payer: 59

## 2024-05-13 ENCOUNTER — Ambulatory Visit: Payer: 59 | Admitting: Family Medicine
